# Patient Record
Sex: Male | Born: 1945
Health system: Southern US, Community
[De-identification: ages and names within clinical notes are randomized; demographics above are authoritative.]

## PROBLEM LIST (undated history)

## (undated) DIAGNOSIS — J45909 Unspecified asthma, uncomplicated: Secondary | ICD-10-CM

## (undated) DIAGNOSIS — F4310 Post-traumatic stress disorder, unspecified: Secondary | ICD-10-CM

## (undated) DIAGNOSIS — J189 Pneumonia, unspecified organism: Secondary | ICD-10-CM

## (undated) DIAGNOSIS — E079 Disorder of thyroid, unspecified: Secondary | ICD-10-CM

## (undated) DIAGNOSIS — E785 Hyperlipidemia, unspecified: Secondary | ICD-10-CM

## (undated) DIAGNOSIS — E039 Hypothyroidism, unspecified: Secondary | ICD-10-CM

## (undated) DIAGNOSIS — K219 Gastro-esophageal reflux disease without esophagitis: Secondary | ICD-10-CM

## (undated) DIAGNOSIS — J449 Chronic obstructive pulmonary disease, unspecified: Secondary | ICD-10-CM

## (undated) DIAGNOSIS — G4730 Sleep apnea, unspecified: Secondary | ICD-10-CM

## (undated) DIAGNOSIS — M1990 Unspecified osteoarthritis, unspecified site: Secondary | ICD-10-CM

## (undated) DIAGNOSIS — J8410 Pulmonary fibrosis, unspecified: Secondary | ICD-10-CM

## (undated) DIAGNOSIS — Z8719 Personal history of other diseases of the digestive system: Secondary | ICD-10-CM

## (undated) DIAGNOSIS — I10 Essential (primary) hypertension: Secondary | ICD-10-CM

## (undated) HISTORY — PX: THYROIDECTOMY, PARTIAL: SHX18

## (undated) HISTORY — DX: Unspecified osteoarthritis, unspecified site: M19.90

## (undated) HISTORY — PX: FRACTURE SURGERY: SHX138

## (undated) HISTORY — PX: CERVICAL SPINE SURGERY: SHX589

## (undated) HISTORY — PX: BACK SURGERY: SHX140

## (undated) HISTORY — PX: WISDOM TOOTH EXTRACTION: SHX21

## (undated) HISTORY — PX: FOOT FRACTURE SURGERY: SHX645

## (undated) HISTORY — DX: Chronic obstructive pulmonary disease, unspecified: J44.9

## (undated) HISTORY — DX: Pulmonary fibrosis, unspecified: J84.10

## (undated) HISTORY — PX: APPENDECTOMY: SHX54

## (undated) HISTORY — PX: NISSEN FUNDOPLICATION: SHX2091

## (undated) HISTORY — PX: CARDIAC CATHETERIZATION: SHX172

## (undated) HISTORY — DX: Sleep apnea, unspecified: G47.30

## (undated) HISTORY — DX: Post-traumatic stress disorder, unspecified: F43.10

---
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
## 1984-06-21 HISTORY — PX: NASAL SEPTUM SURGERY: SHX37

## 2001-06-23 ENCOUNTER — Encounter: Payer: Self-pay | Admitting: Family Medicine

## 2002-06-21 HISTORY — PX: FINGER AMPUTATION: SHX636

## 2003-05-01 LAB — HM COLONOSCOPY

## 2004-02-14 ENCOUNTER — Encounter: Admission: RE | Admit: 2004-02-14 | Discharge: 2004-02-14 | Payer: Self-pay | Admitting: Internal Medicine

## 2004-02-25 ENCOUNTER — Encounter: Payer: Self-pay | Admitting: Internal Medicine

## 2004-04-22 ENCOUNTER — Ambulatory Visit: Payer: Self-pay | Admitting: Cardiology

## 2004-04-23 ENCOUNTER — Ambulatory Visit: Payer: Self-pay | Admitting: Cardiology

## 2004-04-24 ENCOUNTER — Ambulatory Visit: Payer: Self-pay | Admitting: Cardiology

## 2004-04-28 ENCOUNTER — Ambulatory Visit: Payer: Self-pay | Admitting: Cardiology

## 2004-04-29 ENCOUNTER — Inpatient Hospital Stay (HOSPITAL_BASED_OUTPATIENT_CLINIC_OR_DEPARTMENT_OTHER): Admission: RE | Admit: 2004-04-29 | Discharge: 2004-04-29 | Payer: Self-pay | Admitting: Cardiology

## 2005-04-29 ENCOUNTER — Encounter: Admission: RE | Admit: 2005-04-29 | Discharge: 2005-04-29 | Payer: Self-pay | Admitting: Internal Medicine

## 2005-05-14 ENCOUNTER — Encounter: Admission: RE | Admit: 2005-05-14 | Discharge: 2005-05-14 | Payer: Self-pay | Admitting: Internal Medicine

## 2005-06-15 ENCOUNTER — Encounter: Admission: RE | Admit: 2005-06-15 | Discharge: 2005-06-15 | Payer: Self-pay | Admitting: General Surgery

## 2005-06-22 ENCOUNTER — Encounter (INDEPENDENT_AMBULATORY_CARE_PROVIDER_SITE_OTHER): Payer: Self-pay | Admitting: Specialist

## 2005-06-22 ENCOUNTER — Other Ambulatory Visit: Admission: RE | Admit: 2005-06-22 | Discharge: 2005-06-22 | Payer: Self-pay | Admitting: Interventional Radiology

## 2005-06-22 ENCOUNTER — Encounter: Admission: RE | Admit: 2005-06-22 | Discharge: 2005-06-22 | Payer: Self-pay | Admitting: General Surgery

## 2005-09-29 ENCOUNTER — Encounter: Admission: RE | Admit: 2005-09-29 | Discharge: 2005-09-29 | Payer: Self-pay | Admitting: Endocrinology

## 2006-04-22 ENCOUNTER — Ambulatory Visit: Payer: Self-pay | Admitting: Family Medicine

## 2006-04-22 LAB — CONVERTED CEMR LAB
AST: 24 units/L (ref 0–37)
BUN: 12 mg/dL (ref 6–23)
CO2: 31 meq/L (ref 19–32)
Calcium: 9.7 mg/dL (ref 8.4–10.5)
Creatinine, Ser: 1 mg/dL (ref 0.4–1.5)
Glomerular Filtration Rate, Af Am: 98 mL/min/{1.73_m2}
PSA: 6.02 ng/mL — ABNORMAL HIGH (ref 0.10–4.00)

## 2006-05-24 ENCOUNTER — Encounter: Admission: RE | Admit: 2006-05-24 | Discharge: 2006-05-24 | Payer: Self-pay | Admitting: Endocrinology

## 2006-07-04 ENCOUNTER — Encounter (INDEPENDENT_AMBULATORY_CARE_PROVIDER_SITE_OTHER): Payer: Self-pay | Admitting: *Deleted

## 2006-07-04 ENCOUNTER — Ambulatory Visit (HOSPITAL_COMMUNITY): Admission: RE | Admit: 2006-07-04 | Discharge: 2006-07-05 | Payer: Self-pay | Admitting: General Surgery

## 2006-10-26 DIAGNOSIS — K219 Gastro-esophageal reflux disease without esophagitis: Secondary | ICD-10-CM

## 2006-10-26 DIAGNOSIS — F528 Other sexual dysfunction not due to a substance or known physiological condition: Secondary | ICD-10-CM | POA: Insufficient documentation

## 2006-10-26 DIAGNOSIS — I10 Essential (primary) hypertension: Secondary | ICD-10-CM

## 2006-10-26 DIAGNOSIS — E039 Hypothyroidism, unspecified: Secondary | ICD-10-CM

## 2006-10-26 DIAGNOSIS — E785 Hyperlipidemia, unspecified: Secondary | ICD-10-CM | POA: Insufficient documentation

## 2006-10-28 ENCOUNTER — Encounter: Payer: Self-pay | Admitting: Family Medicine

## 2006-10-28 ENCOUNTER — Ambulatory Visit: Payer: Self-pay | Admitting: Family Medicine

## 2006-10-28 ENCOUNTER — Encounter (INDEPENDENT_AMBULATORY_CARE_PROVIDER_SITE_OTHER): Payer: Self-pay | Admitting: Family Medicine

## 2006-10-28 DIAGNOSIS — R972 Elevated prostate specific antigen [PSA]: Secondary | ICD-10-CM | POA: Insufficient documentation

## 2006-11-04 ENCOUNTER — Ambulatory Visit: Payer: Self-pay | Admitting: Family Medicine

## 2006-11-06 LAB — CONVERTED CEMR LAB
ALT: 30 units/L (ref 0–40)
Calcium: 9.4 mg/dL (ref 8.4–10.5)
Chloride: 103 meq/L (ref 96–112)
GFR calc Af Amer: 111 mL/min
GFR calc non Af Amer: 91 mL/min
Sodium: 140 meq/L (ref 135–145)

## 2006-11-07 ENCOUNTER — Telehealth (INDEPENDENT_AMBULATORY_CARE_PROVIDER_SITE_OTHER): Payer: Self-pay | Admitting: *Deleted

## 2006-11-09 ENCOUNTER — Ambulatory Visit: Payer: Self-pay | Admitting: Family Medicine

## 2006-11-11 ENCOUNTER — Ambulatory Visit: Payer: Self-pay | Admitting: Family Medicine

## 2006-11-13 LAB — CONVERTED CEMR LAB
HDL: 35.2 mg/dL — ABNORMAL LOW (ref >39.0)
VLDL: 27 mg/dL (ref 0–40)

## 2006-11-15 ENCOUNTER — Telehealth (INDEPENDENT_AMBULATORY_CARE_PROVIDER_SITE_OTHER): Payer: Self-pay | Admitting: *Deleted

## 2006-12-08 ENCOUNTER — Telehealth (INDEPENDENT_AMBULATORY_CARE_PROVIDER_SITE_OTHER): Payer: Self-pay | Admitting: *Deleted

## 2007-04-28 ENCOUNTER — Encounter (INDEPENDENT_AMBULATORY_CARE_PROVIDER_SITE_OTHER): Payer: Self-pay | Admitting: *Deleted

## 2007-04-28 ENCOUNTER — Telehealth (INDEPENDENT_AMBULATORY_CARE_PROVIDER_SITE_OTHER): Payer: Self-pay | Admitting: *Deleted

## 2007-04-28 ENCOUNTER — Ambulatory Visit: Payer: Self-pay | Admitting: Family Medicine

## 2007-04-28 LAB — CONVERTED CEMR LAB
BUN: 14 mg/dL (ref 6–23)
CO2: 31 meq/L (ref 19–32)
GFR calc Af Amer: 88 mL/min
Glucose, Bld: 103 mg/dL — ABNORMAL HIGH (ref 70–99)
HDL: 37.6 mg/dL — ABNORMAL LOW (ref >39.0)
Potassium: 4.6 meq/L (ref 3.5–5.1)
Sodium: 139 meq/L (ref 135–145)
Total CHOL/HDL Ratio: 4.1
Triglycerides: 120 mg/dL (ref 0–149)

## 2007-05-15 ENCOUNTER — Telehealth (INDEPENDENT_AMBULATORY_CARE_PROVIDER_SITE_OTHER): Payer: Self-pay | Admitting: *Deleted

## 2007-07-06 ENCOUNTER — Telehealth (INDEPENDENT_AMBULATORY_CARE_PROVIDER_SITE_OTHER): Payer: Self-pay | Admitting: *Deleted

## 2007-07-13 ENCOUNTER — Telehealth (INDEPENDENT_AMBULATORY_CARE_PROVIDER_SITE_OTHER): Payer: Self-pay | Admitting: *Deleted

## 2007-07-17 ENCOUNTER — Telehealth (INDEPENDENT_AMBULATORY_CARE_PROVIDER_SITE_OTHER): Payer: Self-pay | Admitting: *Deleted

## 2007-10-16 ENCOUNTER — Telehealth (INDEPENDENT_AMBULATORY_CARE_PROVIDER_SITE_OTHER): Payer: Self-pay | Admitting: *Deleted

## 2007-10-18 ENCOUNTER — Encounter (INDEPENDENT_AMBULATORY_CARE_PROVIDER_SITE_OTHER): Payer: Self-pay | Admitting: Family Medicine

## 2008-03-25 ENCOUNTER — Telehealth (INDEPENDENT_AMBULATORY_CARE_PROVIDER_SITE_OTHER): Payer: Self-pay | Admitting: *Deleted

## 2008-03-26 ENCOUNTER — Ambulatory Visit: Payer: Self-pay | Admitting: Family Medicine

## 2008-03-26 DIAGNOSIS — J449 Chronic obstructive pulmonary disease, unspecified: Secondary | ICD-10-CM

## 2008-03-28 ENCOUNTER — Ambulatory Visit: Payer: Self-pay | Admitting: Family Medicine

## 2008-04-11 ENCOUNTER — Ambulatory Visit: Payer: Self-pay | Admitting: Critical Care Medicine

## 2008-04-16 ENCOUNTER — Ambulatory Visit: Payer: Self-pay | Admitting: Critical Care Medicine

## 2008-04-17 ENCOUNTER — Encounter: Payer: Self-pay | Admitting: Critical Care Medicine

## 2008-05-20 ENCOUNTER — Ambulatory Visit: Payer: Self-pay | Admitting: Family Medicine

## 2008-05-20 DIAGNOSIS — J45909 Unspecified asthma, uncomplicated: Secondary | ICD-10-CM

## 2008-05-23 ENCOUNTER — Telehealth (INDEPENDENT_AMBULATORY_CARE_PROVIDER_SITE_OTHER): Payer: Self-pay | Admitting: *Deleted

## 2008-05-28 ENCOUNTER — Ambulatory Visit: Payer: Self-pay | Admitting: Critical Care Medicine

## 2008-06-08 LAB — CONVERTED CEMR LAB
AST: 25 units/L (ref 0–37)
Albumin: 3.7 g/dL (ref 3.5–5.2)
Alkaline Phosphatase: 75 units/L (ref 39–117)
BUN: 12 mg/dL (ref 6–23)
Bilirubin, Direct: 0.1 mg/dL (ref 0.0–0.3)
Chloride: 107 meq/L (ref 96–112)
Eosinophils Relative: 2.6 % (ref 0.0–5.0)
GFR calc Af Amer: 87 mL/min
GFR calc non Af Amer: 72 mL/min
Glucose, Bld: 97 mg/dL (ref 70–99)
HDL: 41.2 mg/dL (ref >39.0)
LDL Cholesterol: 90 mg/dL (ref 0–99)
Monocytes Relative: 8.4 % (ref 3.0–12.0)
Neutrophils Relative %: 70.6 % (ref 43.0–77.0)
Platelets: 225 10*3/uL (ref 150–400)
Potassium: 4.4 meq/L (ref 3.5–5.1)
RDW: 12.5 % (ref 11.5–14.6)
Sodium: 143 meq/L (ref 135–145)
Total CHOL/HDL Ratio: 3.6
Total Protein: 6.7 g/dL (ref 6.0–8.3)
Triglycerides: 85 mg/dL (ref 0–149)
VLDL: 17 mg/dL (ref 0–40)
WBC: 7 10*3/uL (ref 4.5–10.5)

## 2008-06-11 ENCOUNTER — Telehealth (INDEPENDENT_AMBULATORY_CARE_PROVIDER_SITE_OTHER): Payer: Self-pay | Admitting: *Deleted

## 2008-06-18 ENCOUNTER — Encounter (INDEPENDENT_AMBULATORY_CARE_PROVIDER_SITE_OTHER): Payer: Self-pay | Admitting: *Deleted

## 2008-06-18 ENCOUNTER — Encounter: Payer: Self-pay | Admitting: Family Medicine

## 2008-07-04 ENCOUNTER — Ambulatory Visit: Payer: Self-pay | Admitting: Critical Care Medicine

## 2008-09-12 ENCOUNTER — Telehealth (INDEPENDENT_AMBULATORY_CARE_PROVIDER_SITE_OTHER): Payer: Self-pay | Admitting: *Deleted

## 2009-04-16 ENCOUNTER — Ambulatory Visit: Payer: Self-pay | Admitting: Family Medicine

## 2009-05-22 ENCOUNTER — Ambulatory Visit: Payer: Self-pay | Admitting: Family Medicine

## 2009-05-22 DIAGNOSIS — N400 Enlarged prostate without lower urinary tract symptoms: Secondary | ICD-10-CM | POA: Insufficient documentation

## 2009-05-22 LAB — CONVERTED CEMR LAB
Bilirubin Urine: NEGATIVE
Cholesterol, target level: 200 mg/dL
Glucose, Urine, Semiquant: NEGATIVE
Ketones, urine, test strip: NEGATIVE
LDL Goal: 130 mg/dL
Urobilinogen, UA: NEGATIVE
pH: 6

## 2009-05-23 ENCOUNTER — Encounter (INDEPENDENT_AMBULATORY_CARE_PROVIDER_SITE_OTHER): Payer: Self-pay | Admitting: *Deleted

## 2009-05-23 LAB — CONVERTED CEMR LAB
ALT: 43 units/L (ref 0–53)
BUN: 11 mg/dL (ref 6–23)
CO2: 30 meq/L (ref 19–32)
Chloride: 103 meq/L (ref 96–112)
Cholesterol: 144 mg/dL (ref 0–200)
Creatinine, Ser: 1 mg/dL (ref 0.4–1.5)
Eosinophils Absolute: 0.1 10*3/uL (ref 0.0–0.7)
Eosinophils Relative: 2.1 % (ref 0.0–5.0)
Glucose, Bld: 111 mg/dL — ABNORMAL HIGH (ref 70–99)
HCT: 48 % (ref 39.0–52.0)
Lymphs Abs: 1.2 10*3/uL (ref 0.7–4.0)
MCHC: 33.4 g/dL (ref 30.0–36.0)
MCV: 92.4 fL (ref 78.0–100.0)
Monocytes Absolute: 0.6 10*3/uL (ref 0.1–1.0)
Neutrophils Relative %: 68.9 % (ref 43.0–77.0)
Platelets: 187 10*3/uL (ref 150.0–400.0)
Potassium: 4.8 meq/L (ref 3.5–5.1)
RDW: 12.1 % (ref 11.5–14.6)
TSH: 1.22 microintl units/mL (ref 0.35–5.50)
Total Bilirubin: 0.9 mg/dL (ref 0.3–1.2)
Triglycerides: 113 mg/dL (ref 0.0–149.0)
WBC: 6.6 10*3/uL (ref 4.5–10.5)

## 2009-08-04 ENCOUNTER — Telehealth (INDEPENDENT_AMBULATORY_CARE_PROVIDER_SITE_OTHER): Payer: Self-pay | Admitting: *Deleted

## 2009-11-26 ENCOUNTER — Telehealth: Payer: Self-pay | Admitting: Family Medicine

## 2010-03-23 ENCOUNTER — Encounter: Payer: Self-pay | Admitting: Family Medicine

## 2010-05-07 ENCOUNTER — Encounter: Payer: Self-pay | Admitting: Family Medicine

## 2010-05-08 ENCOUNTER — Encounter (INDEPENDENT_AMBULATORY_CARE_PROVIDER_SITE_OTHER): Payer: Self-pay | Admitting: *Deleted

## 2010-07-07 ENCOUNTER — Encounter: Payer: Self-pay | Admitting: Family Medicine

## 2010-07-07 ENCOUNTER — Other Ambulatory Visit: Payer: Self-pay | Admitting: Family Medicine

## 2010-07-07 ENCOUNTER — Ambulatory Visit
Admission: RE | Admit: 2010-07-07 | Discharge: 2010-07-07 | Payer: Self-pay | Source: Home / Self Care | Attending: Family Medicine | Admitting: Family Medicine

## 2010-07-07 LAB — BASIC METABOLIC PANEL
BUN: 13 mg/dL (ref 6–23)
CO2: 29 mEq/L (ref 19–32)
Calcium: 9.3 mg/dL (ref 8.4–10.5)
Chloride: 102 mEq/L (ref 96–112)
Creatinine, Ser: 1 mg/dL (ref 0.4–1.5)
GFR: 79.81 mL/min (ref >60.00)
Glucose, Bld: 95 mg/dL (ref 70–99)
Potassium: 4.4 mEq/L (ref 3.5–5.1)
Sodium: 140 mEq/L (ref 135–145)

## 2010-07-07 LAB — CBC WITH DIFFERENTIAL/PLATELET
Basophils Absolute: 0 10*3/uL (ref 0.0–0.1)
Basophils Relative: 0.3 % (ref 0.0–3.0)
Eosinophils Absolute: 0.4 10*3/uL (ref 0.0–0.7)
Eosinophils Relative: 4.5 % (ref 0.0–5.0)
HCT: 46.1 % (ref 39.0–52.0)
Hemoglobin: 15.8 g/dL (ref 13.0–17.0)
Lymphocytes Relative: 16.5 % (ref 12.0–46.0)
Lymphs Abs: 1.4 10*3/uL (ref 0.7–4.0)
MCHC: 34.4 g/dL (ref 30.0–36.0)
MCV: 89.3 fl (ref 78.0–100.0)
Monocytes Absolute: 0.9 10*3/uL (ref 0.1–1.0)
Monocytes Relative: 11.2 % (ref 3.0–12.0)
Neutro Abs: 5.7 10*3/uL (ref 1.4–7.7)
Neutrophils Relative %: 67.5 % (ref 43.0–77.0)
Platelets: 206 10*3/uL (ref 150.0–400.0)
RBC: 5.16 Mil/uL (ref 4.22–5.81)
RDW: 13.6 % (ref 11.5–14.6)
WBC: 8.4 10*3/uL (ref 4.5–10.5)

## 2010-07-07 LAB — LIPID PANEL
Cholesterol: 156 mg/dL (ref 0–200)
HDL: 41.1 mg/dL (ref >39.00)
LDL Cholesterol: 85 mg/dL (ref 0–99)
Total CHOL/HDL Ratio: 4
Triglycerides: 151 mg/dL — ABNORMAL HIGH (ref 0.0–149.0)
VLDL: 30.2 mg/dL (ref 0.0–40.0)

## 2010-07-07 LAB — HEPATIC FUNCTION PANEL
ALT: 28 U/L (ref 0–53)
AST: 24 U/L (ref 0–37)
Albumin: 4 g/dL (ref 3.5–5.2)
Alkaline Phosphatase: 88 U/L (ref 39–117)
Bilirubin, Direct: 0.1 mg/dL (ref 0.0–0.3)
Total Bilirubin: 0.8 mg/dL (ref 0.3–1.2)
Total Protein: 6.6 g/dL (ref 6.0–8.3)

## 2010-07-07 LAB — TSH: TSH: 2.39 u[IU]/mL (ref 0.35–5.50)

## 2010-07-11 ENCOUNTER — Encounter: Payer: Self-pay | Admitting: Internal Medicine

## 2010-07-21 NOTE — Letter (Signed)
Summary: [facility]  [facility]   Imported By: Lanelle Bal 04/01/2010 14:16:18  _____________________________________________________________________  External Attachment:    Type:   Image     Comment:   External Document

## 2010-07-21 NOTE — Progress Notes (Signed)
Summary: refill  Phone Note Refill Request Message from:  [facility]  Refills Requested: Medication #1:  VIAGRA 100 MG TABS as directed   Last Refilled: 05/22/2009  Follow-up for Phone Call        last ov- 04/2009. Army Fossa CMA  November 26, 2009 10:47 AM   Additional Follow-up for Phone Call Additional follow up Details #1::        refill x1   Additional Follow-up by: Loreen Freud DO,  November 26, 2009 12:18 PM    Prescriptions: VIAGRA 100 MG TABS (SILDENAFIL CITRATE) as directed  #15 x 0   Entered by:   Army Fossa CMA   Authorized by:   Loreen Freud DO   Signed by:   Army Fossa CMA on 11/26/2009   Method used:   Electronically to        [facility] (mail-order)             ,          Ph: [phone]       Fax: [phone]   RxID:   [id number]

## 2010-07-21 NOTE — Miscellaneous (Signed)
Summary: Immunization Entry   Immunization History:  Influenza Immunization History:    Influenza:  historical @ [facility] (05/07/2010)

## 2010-07-21 NOTE — Progress Notes (Signed)
Summary: Refill Request  Phone Note Refill Request Message from:  Pharmacy [facility] Fax #: [phone]  Refills Requested: Medication #1:  SYNTHROID 75 MCG  TABS Take one tablet daily   Dosage confirmed as above?Dosage Confirmed  Medication #2:  OMEPRAZOLE 20 MG TBEC Take 1 tablet by mouth one a day   Dosage confirmed as above?Dosage Confirmed Initial call taken by: Harold Barban,  August 04, 2009 11:40 AM    Prescriptions: OMEPRAZOLE 20 MG TBEC (OMEPRAZOLE) Take 1 tablet by mouth one a day  #90 x 3   Entered by:   Shonna Chock   Authorized by:   Marga Melnick MD   Signed by:   Shonna Chock on 08/04/2009   Method used:   Faxed to ...       [facility]* (mail-order)             ,          Ph: [phone]       Fax: [phone]   RxID:   [id number] SYNTHROID 75 MCG  TABS (LEVOTHYROXINE SODIUM) Take one tablet daily  #90 x 3   Entered by:   Shonna Chock   Authorized by:   Marga Melnick MD   Signed by:   Shonna Chock on 08/04/2009   Method used:   Faxed to ...       [facility] (mail-order)             ,          Ph: [phone]       Fax: [phone]   RxID:   [id number]

## 2010-07-21 NOTE — Miscellaneous (Signed)
Summary: Flu/[facility]  Flu[facility]   Imported By: Lanelle Bal 05/18/2010 12:27:33  _____________________________________________________________________  External Attachment:    Type:   Image     Comment:   External Document

## 2010-07-23 NOTE — Assessment & Plan Note (Signed)
Summary: cpx and fasting labs///sph   Vital Signs:  Patient profile:   65 year old male Height:      73.5 inches Weight:      195.4 pounds BMI:     25.52 Pulse rate:   72 / minute Pulse rhythm:   regular BP sitting:   130 / 80  (right arm) Cuff size:   large  Vitals Entered By: Almeta Monas CMA Duncan Dull) (July 07, 2010 8:45 AM) CC: CPX/Fasting   History of Present Illness: Pt here for cpe and labs.   Pt is getting over cold.   Pt sees Dr Aldean Ast for urology.   Hyperlipidemia follow-up      This is a 65 year old man who presents for Hyperlipidemia follow-up.  The patient denies muscle aches, GI upset, abdominal pain, flushing, itching, constipation, diarrhea, and fatigue.  The patient denies the following symptoms: chest pain/pressure, exercise intolerance, dypsnea, palpitations, syncope, and pedal edema.  Compliance with medications (by patient report) has been near 100%.  Dietary compliance has been good.  The patient reports no exercise.  Adjunctive measures currently used by the patient include ASA and fish oil supplements.    Hypertension follow-up      The patient also presents for Hypertension follow-up.  The patient denies lightheadedness, urinary frequency, headaches, edema, impotence, rash, and fatigue.  The patient denies the following associated symptoms: chest pain, chest pressure, exercise intolerance, dyspnea, palpitations, syncope, leg edema, and pedal edema.  Compliance with medications (by patient report) has been near 100%.  The patient reports that dietary compliance has been good.  The patient reports no exercise.  Adjunctive measures currently used by the patient include salt restriction.    Preventive Screening-Counseling & Management  Alcohol-Tobacco     Alcohol drinks/day: <1     Alcohol type: beer     Smoking Status: quit     Packs/Day: 1.0     Year Quit: 1997     Pack years: 35     Passive Smoke Exposure: no  Caffeine-Diet-Exercise     Caffeine  use/day: 3     Does Patient Exercise: yes     Type of exercise: walking,  elliptical     Times/week: <3     Exercise Counseling: to improve exercise regimen  Hep-HIV-STD-Contraception     HIV Risk: no     Dental Visit-last 6 months yes     Dental Care Counseling: not indicated; dental care within six months  Safety-Violence-Falls     Seat Belt Use: 100      Sexual History:  currently monogamous.    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 Take One Tablet Daily 2)  Synthroid 75 Mcg  Tabs (Levothyroxine Sodium) .... Take One Tablet Daily 3)  Lipitor 20 Mg Tabs (Atorvastatin Calcium) .... As Directed 4)  Omeprazole 20 Mg Tbec (Omeprazole) .... Take 1 Tablet By Mouth One A Day 5)  Viagra 100 Mg Tabs (Sildenafil  Citrate) .... As Directed 6)  Multivitamin/iron   Tabs (Multiple Vitamins-Iron) .... Take 1 Tablet By Mouth Once A Day 7)  Symbicort 160-4.5 Mcg/act Aero (Budesonide-Formoterol Fumarate) .... 2 Puffs Two Times A Day 8)  Combivent 103-18 Mcg/act Aero (Ipratropium-Albuterol) .... 2 Puffs Qid As Needed 9)  Bayer Childrens Aspirin 81 Mg Chew (Aspirin) .... Take One By Mouth Once Daily 10)  Vitamin C 500 Mg Tabs (Ascorbic Acid) .... Take One By Mouth Once Daily 11)  Fish Oil 2000 Mg .[name] Kitchen.. 1 By Mouth Daily. 12)  Caltrate 600+d 600-400 Mg-Unit Tabs (Calcium  Carbonate-Vitamin D) .... Daily. 13)  Avodart 0.5 Mg Caps (Dutasteride) .[name] Kitchen.. 1 By Mouth Daily.  Current Medications (verified): 1)  Lisinopril-Hydrochlorothiazide 10-12.5 Mg  Tabs (Lisinopril-Hydrochlorothiazide) .... Take One Tablet Daily 2)  Synthroid 75 Mcg  Tabs (Levothyroxine Sodium) .... Take One Tablet Daily 3)  Lipitor 20 Mg Tabs (Atorvastatin Calcium) .... As Directed 4)  Omeprazole 20 Mg Tbec (Omeprazole) .... Take 1 Tablet By Mouth One A Day 5)  Viagra 100 Mg Tabs (Sildenafil Citrate) .... As Directed 6)  Multivitamin/iron   Tabs (Multiple Vitamins-Iron) .... Take 1 Tablet By Mouth Once A Day 7)  Combivent 103-18 Mcg/act Aero (Ipratropium-Albuterol) .... 2 Puffs Qid As Needed 8)  Bayer Childrens Aspirin 81 Mg Chew (Aspirin) .... Take One By Mouth Once Daily 9)  Vitamin C 500 Mg Tabs (Ascorbic Acid) .... Take One By Mouth Once Daily 10)  Fish Oil 2000 Mg .[name] Kitchen.. 1 By Mouth Daily. 11)  Caltrate 600+d 600-400 Mg-Unit Tabs (Calcium Carbonate-Vitamin D) .... Daily.  Allergies (verified): 1)  ! Codeine  Past History:  Past Medical History: Last updated: 05/22/2009 Hypertension Hyperlipidemia GERD Asthma    -58% Fev1  DLCO 82% Benign prostatic hypertrophy--- Dr Aldean Ast  Past Surgical History: Last updated: 04/11/2008 06/2006 Thyroidectomy Nissen fundoplication 1993 hernia 1965  Family History: Last updated: 04/11/2008 paternal grandmother-heart disease father-prostate CA  Social History: Last updated: 05/20/2008 Retired-deputy sheriff Former Smoker. Quit smoking 1997.  Smoked x 35 yrs 1ppd. Regular exercise-yes Married with children.  Alcohol use-yes Drug use-no  Risk Factors: Alcohol Use: <1 (07/07/2010) Caffeine Use: 3 (07/07/2010) Exercise: yes (07/07/2010)  Risk Factors: Smoking Status: quit (07/07/2010) Packs/Day: 1.0 (07/07/2010) Passive Smoke Exposure: no (07/07/2010)  Family History: Reviewed history from 04/11/2008 and no changes  required. paternal grandmother-heart disease father-prostate CA  Social History: Reviewed history from 05/20/2008 and no changes required. Retired-deputy sheriff Former Smoker. Quit smoking 1997.  Smoked x 35 yrs 1ppd. Regular exercise-yes Married with children.  Alcohol use-yes Drug use-no Sexual History:  currently monogamous  Review of Systems      See HPI General:  Denies chills, fatigue, fever, loss of appetite, malaise, sleep disorder, sweats, weakness, and weight loss. Eyes:  Denies blurring, discharge, double vision, eye irritation, eye pain, halos, itching, light sensitivity, red eye, vision loss-1 eye, and vision loss-both eyes; optho q1y. ENT:  Denies decreased hearing, difficulty swallowing, ear discharge, earache, hoarseness, nasal congestion, nosebleeds, postnasal drainage, ringing in ears, sinus pressure, and sore throat. CV:  Denies bluish discoloration of lips or nails, chest pain or discomfort, difficulty breathing at night, difficulty breathing while lying down, fainting, fatigue, leg cramps with exertion, lightheadness, near fainting, palpitations, shortness of breath with exertion, swelling of feet, swelling of hands, and weight gain. Resp:  Denies chest discomfort, chest pain with inspiration, cough, coughing up blood, excessive snoring, hypersomnolence, morning headaches, pleuritic, shortness of breath, sputum productive, and wheezing. GI:  Denies abdominal pain, bloody  stools, change in bowel habits, constipation, dark tarry stools, diarrhea, excessive appetite, gas, hemorrhoids, indigestion, loss of appetite, nausea, vomiting, vomiting blood, and yellowish skin color. GU:  Denies decreased libido, discharge, dysuria, erectile dysfunction, genital sores, hematuria, incontinence, nocturia, urinary frequency, and urinary hesitancy. MS:  Denies joint pain, joint redness, joint swelling, loss of strength, low back pain, mid back pain, muscle aches, muscle , cramps, muscle  weakness, stiffness, and thoracic pain. Derm:  Denies changes in color of skin, changes in nail beds, dryness, excessive perspiration, flushing, hair loss, insect bite(s), itching, lesion(s), poor wound healing, and rash. Neuro:  Denies brief paralysis, difficulty with concentration, disturbances in coordination, falling down, headaches, inability to speak, memory loss, numbness, poor balance, seizures, sensation of room spinning, tingling, tremors, visual disturbances, and weakness. Psych:  Denies alternate hallucination ( auditory/visual), anxiety, depression, easily angered, easily tearful, irritability, mental problems, panic attacks, sense of great danger, suicidal thoughts/plans, thoughts of violence, unusual visions or sounds, and thoughts /plans of harming others. Endo:  Denies cold intolerance, excessive hunger, excessive thirst, excessive urination, heat intolerance, polyuria, and weight change. Heme:  Denies abnormal bruising, bleeding, enlarge lymph nodes, fevers, pallor, and skin discoloration. Allergy:  Denies hives or rash, itching eyes, persistent infections, seasonal allergies, and sneezing.  Physical Exam  General:  Well-developed,well-nourished,in no acute distress; alert,appropriate and cooperative throughout examination Head:  Normocephalic and atraumatic without obvious abnormalities. No apparent alopecia or balding. Eyes:  pupils equal, pupils round, pupils reactive to light, and no injection.   Ears:  External ear exam shows no significant lesions or deformities.  Otoscopic examination reveals clear canals, tympanic membranes are intact bilaterally without bulging, retraction, inflammation or discharge. Hearing is grossly normal bilaterally. Nose:  External nasal examination shows no deformity or inflammation. Nasal mucosa are pink and moist without lesions or exudates. Mouth:  Oral mucosa and oropharynx without lesions or exudates.  Teeth in good repair. Neck:  No  deformities, masses, or tenderness noted.no carotid bruits.   Lungs:  Normal respiratory effort, chest expands symmetrically. Lungs are clear to auscultation, no crackles or wheezes. Heart:  normal rate and no murmur.   Abdomen:  Bowel sounds positive,abdomen soft and non-tender without masses, organomegaly or hernias noted. Rectal:  urology Genitalia:  urology Prostate:  urology Msk:  normal ROM, no joint tenderness, no joint swelling, no joint warmth, no redness over joints, no joint deformities, no joint instability, and no crepitation.   Pulses:  R posterior tibial normal, R dorsalis pedis normal, R carotid normal, L posterior tibial normal, L dorsalis pedis normal, and L carotid normal.   Extremities:  No clubbing, cyanosis, edema, or deformity noted with normal full range of motion of all joints.   Neurologic:  No cranial nerve deficits noted. Station and gait are normal. Plantar reflexes are down-going bilaterally. DTRs are symmetrical throughout. Sensory, motor and coordinative functions appear intact. Skin:  Intact without suspicious lesions or rashes Cervical Nodes:  No lymphadenopathy noted Axillary Nodes:  No palpable lymphadenopathy Psych:  Cognition and judgment appear intact. Alert and cooperative with normal attention span and concentration. No apparent delusions, illusions, hallucinations   Impression & Recommendations:  Problem # 1:  PREVENTIVE HEALTH CARE (ICD-V70.0)  Orders: Venipuncture (16109) TLB-Lipid Panel (80061-LIPID) TLB-BMP (Basic Metabolic Panel-BMET) (80048-METABOL) TLB-CBC Platelet - w/Differential (85025-CBCD) TLB-Hepatic/Liver Function Pnl (80076-HEPATIC) TLB-TSH (Thyroid Stimulating Hormone) (84443-TSH) EKG w/ Interpretation (93000)  Reviewed preventive care protocols, scheduled due services, and updated immunizations.  Problem # 2:  BENIGN PROSTATIC HYPERTROPHY (ICD-600.00)  per  urology  Orders: EKG w/ Interpretation (93000)  Problem # 3:   ASTHMA (ICD-493.90)  The following medications were removed from the medication list:    Symbicort 160-4.5 Mcg/act Aero (Budesonide-formoterol fumarate) .[name] Kitchen... 2 puffs two times a day His updated medication list for this problem includes:    Combivent 103-18 Mcg/act Aero (Ipratropium-albuterol) .[name] Kitchen... 2 puffs qid as needed  Pulmonary Functions Reviewed: O2 sat: 97 (07/04/2008)  Orders: EKG w/ Interpretation (93000)  Problem # 4:  HYPOTHYROIDISM (ICD-244.9)  His updated medication list for this problem includes:    Synthroid 75 Mcg Tabs (Levothyroxine sodium) .[name] Kitchen... Take one tablet daily  Orders: Venipuncture (16109) TLB-Lipid Panel (80061-LIPID) TLB-BMP (Basic Metabolic Panel-BMET) (80048-METABOL) TLB-CBC Platelet - w/Differential (85025-CBCD) TLB-Hepatic/Liver Function Pnl (80076-HEPATIC) TLB-TSH (Thyroid Stimulating Hormone) (84443-TSH) EKG w/ Interpretation (93000)  Labs Reviewed: TSH: 1.22 (05/22/2009)    Chol: 144 (05/22/2009)   HDL: 42.00 (05/22/2009)   LDL: 79 (05/22/2009)   TG: 113.0 (05/22/2009)  Problem # 5:  HYPERLIPIDEMIA (ICD-272.4)  His updated medication list for this problem includes:    Lipitor 20 Mg Tabs (Atorvastatin calcium) .[name] Kitchen... As directed  Orders: Venipuncture (60454) TLB-Lipid Panel (80061-LIPID) TLB-BMP (Basic Metabolic Panel-BMET) (80048-METABOL) TLB-CBC Platelet - w/Differential (85025-CBCD) TLB-Hepatic/Liver Function Pnl (80076-HEPATIC) TLB-TSH (Thyroid Stimulating Hormone) (84443-TSH) EKG w/ Interpretation (93000)  Labs Reviewed: SGOT: 29 (05/22/2009)   SGPT: 43 (05/22/2009)  Lipid Goals: Chol Goal: 200 (05/22/2009)   HDL Goal: 40 (05/22/2009)   LDL Goal: 130 (05/22/2009)   TG Goal: 150 (05/22/2009)  Prior 10 Yr Risk Heart Disease: 9 % (05/22/2009)   HDL:42.00 (05/22/2009), 41.2 (05/20/2008)  LDL:79 (05/22/2009), 90 (05/20/2008)  Chol:144 (05/22/2009), 148 (05/20/2008)  Trig:113.0 (05/22/2009), 85 (05/20/2008)  Problem # 6:   HYPERTENSION (ICD-401.9)  His updated medication list for this problem includes:    Lisinopril-hydrochlorothiazide 10-12.5 Mg Tabs (Lisinopril-hydrochlorothiazide) .[name] Kitchen... Take one tablet daily  Orders: Venipuncture (09811) TLB-Lipid Panel (80061-LIPID) TLB-BMP (Basic Metabolic Panel-BMET) (80048-METABOL) TLB-CBC Platelet - w/Differential (85025-CBCD) TLB-Hepatic/Liver Function Pnl (80076-HEPATIC) TLB-TSH (Thyroid Stimulating Hormone) (84443-TSH) EKG w/ Interpretation (93000)  BP today: 130/80 Prior BP: 130/80 (05/22/2009)  Prior 10 Yr Risk Heart Disease: 9 % (05/22/2009)  Labs Reviewed: K+: 4.8 (05/22/2009) Creat: : 1.0 (05/22/2009)   Chol: 144 (05/22/2009)   HDL: 42.00 (05/22/2009)   LDL: 79 (05/22/2009)   TG: 113.0 (05/22/2009)  Problem # 7:  GERD (ICD-530.81)  His updated medication list for this problem includes:    Omeprazole 20 Mg Tbec (Omeprazole) .[name] Kitchen... Take 1 tablet by mouth one a day  Orders: EKG w/ Interpretation (93000)  Complete Medication List: 1)  Lisinopril-hydrochlorothiazide 10-12.5 Mg Tabs (Lisinopril-hydrochlorothiazide) .... Take one tablet daily 2)  Synthroid 75 Mcg Tabs (Levothyroxine sodium) .... Take one tablet daily 3)  Lipitor 20 Mg Tabs (Atorvastatin calcium) .... As directed 4)  Omeprazole 20 Mg Tbec (Omeprazole) .... Take 1 tablet by mouth one a day 5)  Viagra 100 Mg Tabs (Sildenafil citrate) .... As directed 6)  Multivitamin/iron Tabs (Multiple vitamins-iron) .... Take 1 tablet by mouth once a day 7)  Combivent 103-18 Mcg/act Aero (Ipratropium-albuterol) .... 2 puffs qid as needed 8)  Bayer Childrens Aspirin 81 Mg Chew (Aspirin) .... Take one by mouth once daily 9)  Vitamin C 500 Mg Tabs (Ascorbic acid) .... Take one by mouth once daily 10)  Fish Oil 2000 Mg  .[name] KitchenMarland Kitchen. 1 by mouth daily. 11)  Caltrate 600+d 600-400 Mg-unit Tabs (Calcium carbonate-vitamin d) .... Daily.   Orders Added: 1)  Venipuncture [91478] 2)  TLB-Lipid Panel [80061-LIPID]  3)   TLB-BMP (Basic Metabolic Panel-BMET) [80048-METABOL] 4)  TLB-CBC Platelet - w/Differential [85025-CBCD] 5)  TLB-Hepatic/Liver Function Pnl [80076-HEPATIC] 6)  TLB-TSH (Thyroid Stimulating Hormone) [84443-TSH] 7)  Est. Patient 40-64 years [99396] 8)  EKG w/ Interpretation [93000]    Last Flu Vaccine:  historical @ [facility] (05/07/2010 1:15:15 PM) Flu Vaccine Result Date:  05/07/2010 Flu Vaccine Result:  given Flu Vaccine Next Due:  1 yr    Appended Document: cpx and fasting labs///sph   Appended Document: cpx and fasting labs///sph  Laboratory Results   Urine Tests   Date/Time Reported: July 07, 2010 9:54 AM   Routine Urinalysis   Color: yellow Appearance: Clear Glucose: negative   (Normal Range: Negative) Bilirubin: negative   (Normal Range: Negative) Ketone: negative   (Normal Range: Negative) Spec. Gravity: <1.005   (Normal Range: 1.003-1.035) Blood: negative   (Normal Range: Negative) pH: 7.0   (Normal Range: 5.0-8.0) Protein: negative   (Normal Range: Negative) Urobilinogen: negative   (Normal Range: 0-1) Nitrite: negative   (Normal Range: Negative) Leukocyte Esterace: negative   (Normal Range: Negative)    Comments: Floydene Flock  July 07, 2010 9:54 AM

## 2010-09-28 ENCOUNTER — Other Ambulatory Visit: Payer: Self-pay | Admitting: Family Medicine

## 2010-11-06 NOTE — Cardiovascular Report (Signed)
NAME:  Russell Brown, Russell Brown NO.:  [id number]   MEDICAL RECORD NO.:  [id number]          PATIENT TYPE:  OIB   LOCATION:  6501                         FACILITY:  [facility]   PHYSICIAN:  Rollene Rotunda, M.D.   DATE OF BIRTH:  1946-04-22   DATE OF PROCEDURE:  04/29/2004  DATE OF DISCHARGE:                              CARDIAC CATHETERIZATION   PRIMARY CARE PHYSICIAN:  Sharlet Salina, M.D.   PROCEDURE:  Left heart catheterization/coronary arteriography.   INDICATIONS:  The patient with dyspnea and abnormal Cardiolite suggesting  mild ischemia.   DESCRIPTION OF PROCEDURE:  Left heart catheterization was performed via the  right femoral artery.  The artery was cannulated using anterior wall  puncture.  A #4 French arterial sheath was inserted via the modified  Seldinger technique.  A preformed Judkins' and pigtail catheter were  utilized.  The patient tolerated the procedure well and left the lab in  stable condition.   HEMODYNAMICS:  1.  LV 128/24.  2.  AO 126/88.   CORONARY ARTERIOGRAPHY:  1.  Left main was normal.  2.  The LAD was normal (this was a somewhat small vessel and does not wrap      the apex).  3.  Diagonal was normal.  4.  The circumflex was normal.  5.  There was a ramus intermediate which was large and normal.  6.  OM1 and OM2 were small and normal.  7.  There were two moderate sized posterolaterals which were normal.  8.  The right coronary artery was a large dominant vessel.  It was normal      throughout its course.  9.  There was a PDA which was moderate sized.  10. A posterolateral-1 and posterolateral-2 which were small and normal.   LEFT VENTRICULOGRAM:  The left ventriculogram was obtained in the RAO  projection.  The EF was 60% with normal wall motion.   CONCLUSIONS:  1.  Normal coronaries.  2.  Normal left ventricular function.   PLAN:  The patient will be followed by Dr. Willa Rough, Dr. Sharlet Salina, and Dr. Charlaine Dalton. Wert to  continue to workup dyspnea of unclear  etiology.       JH/MEDQ  D:  04/29/2004  T:  04/29/2004  Job:  [id number]   cc:   Sharlet Salina, M.D.  [address]  [place]  [place] [address]  Fax: [phone]   Charlaine Dalton. Sherene Sires, M.D. [facility]   Willa Rough, M.D.

## 2010-11-06 NOTE — Op Note (Signed)
NAME:  Russell Brown, Russell Brown             ACCOUNT NO.:  [id number]   MEDICAL RECORD NO.:  [id number]          PATIENT TYPE:  AMB   LOCATION:  SDS                          FACILITY:  [facility]   PHYSICIAN:  Leonie Man, M.D.   DATE OF BIRTH:  October 06, 1945   DATE OF PROCEDURE:  07/04/2006  DATE OF DISCHARGE:                               OPERATIVE REPORT   PREOPERATIVE DIAGNOSIS:  Enlarging right thyroid mass.   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   PROCEDURES:  Right thyroid lobectomy and isthmectomy.   SURGEON:  Leonie Man, M.D.   ASSISTANT:  Cicero Duck, M.D.   ANESTHESIA:  General.   SPECIMENS TO LAB:  Right thyroid lobe and isthmus.   ESTIMATED BLOOD LOSS:  Minimal.   COMPLICATIONS:  None.  The patient returned to PACU in excellent  condition.   NOTE:  The patient is a 65 year old gentleman with some mild to moderate  dysphagia and dysphonia and a right-sided thyroid mass which increased  by 1 cm incised over the past 6 months.  Previous thyroid.  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  Because of  the rapid increase in size of the thyroid, the patient is brought now to  the operating room, after risks and potential benefits of surgery been  fully discussed, all questions answered and consent obtained.   PROCEDURE:  The patient positioned supinely, head and neck extended  slightly hyperextended.  The neck was prepped and draped to be included  in a sterile operative field after satisfactory general endotracheal  anesthesia.  A collar incision is carried down approximately three  fingerbreadths above the sternal notch, deepened through skin and  subcutaneous tissue and raising myocutaneous flap of the platysma muscle  superiorly up to the thyroid cartilage and inferiorly down to the  sternal notch.  Midline strap muscles are opened up and dissection  carried down on the right side to expose the  right lobe of thyroid.  The  right lobe is mobilized and is noted to be very large cystic mass on the  right thyroid lobe which had probably accounts for this rapid growth.  This was however, not fully appreciated on ultrasound.  Dissection  carried up towards the upper pole where the upper pole vessels were  serially isolated and tied with 2-0 silk tied distally.  I used the  harmonic focus to transect the remainder of the upper lobe.  The thyroid  was then rolled medially and dissection was carried down removing the  thyroid gland serially staying close the along capsule.  The recurrent  laryngeal nerve and the upper and lower right-sided parathyroid glands  were positively identified and spared.  The thyroid was then dissected  free from carrying it across the midline of the trachea and across the  isthmus to the left lobe.  This was divided with the harmonic focus.  The specimen was then forwarded for pathologic evaluation.  Frozen  section diagnosis is consistent with a non-neoplastic thyroid mass.  The  neck was then thoroughly irrigated on the right side with normal  saline.  The left lobe of the thyroid was then exposed thoroughly palpated.  There were no suspicious nodules noted.  Sponge, instrument and sharp  counts were verified.  I then closed midline strap muscles with running  3-0 Vicryl suture.  Platysma muscle closed with interrupted 3-0 Vicryl  sutures.  Skin closed with running 5-0 Monocryl and then reinforced with  Steri-Strips.  Sterile compressive dressings applied.  Anesthetic  reversed.  The patient removed from the operating room to the recovery  room in stable condition.  He tolerated the procedure well.      Leonie Man, M.D.  Electronically Signed     PB/MEDQ  D:  07/04/2006  T:  07/04/2006  Job:  [id number]

## 2010-12-25 ENCOUNTER — Other Ambulatory Visit: Payer: Self-pay | Admitting: Family Medicine

## 2010-12-25 NOTE — Telephone Encounter (Signed)
Rx sent    Kp

## 2011-02-13 ENCOUNTER — Emergency Department (HOSPITAL_COMMUNITY): Payer: Medicare Other

## 2011-02-13 ENCOUNTER — Emergency Department (HOSPITAL_COMMUNITY)
Admission: EM | Admit: 2011-02-13 | Discharge: 2011-02-14 | Disposition: A | Discharge status: Home / Self Care | Payer: Medicare Other | Attending: Emergency Medicine | Admitting: Emergency Medicine

## 2011-02-13 DIAGNOSIS — J45909 Unspecified asthma, uncomplicated: Secondary | ICD-10-CM | POA: Insufficient documentation

## 2011-02-13 DIAGNOSIS — S0180XA Unspecified open wound of other part of head, initial encounter: Secondary | ICD-10-CM | POA: Insufficient documentation

## 2011-02-13 DIAGNOSIS — S61509A Unspecified open wound of unspecified wrist, initial encounter: Secondary | ICD-10-CM | POA: Insufficient documentation

## 2011-02-13 DIAGNOSIS — S0990XA Unspecified injury of head, initial encounter: Secondary | ICD-10-CM | POA: Insufficient documentation

## 2011-02-13 DIAGNOSIS — W108XXA Fall (on) (from) other stairs and steps, initial encounter: Secondary | ICD-10-CM | POA: Insufficient documentation

## 2011-02-13 DIAGNOSIS — S51809A Unspecified open wound of unspecified forearm, initial encounter: Secondary | ICD-10-CM | POA: Insufficient documentation

## 2011-02-13 DIAGNOSIS — I10 Essential (primary) hypertension: Secondary | ICD-10-CM | POA: Insufficient documentation

## 2011-02-13 DIAGNOSIS — Y92009 Unspecified place in unspecified non-institutional (private) residence as the place of occurrence of the external cause: Secondary | ICD-10-CM | POA: Insufficient documentation

## 2011-02-21 ENCOUNTER — Emergency Department (HOSPITAL_BASED_OUTPATIENT_CLINIC_OR_DEPARTMENT_OTHER)
Admission: EM | Admit: 2011-02-21 | Discharge: 2011-02-21 | Disposition: A | Discharge status: Home / Self Care | Payer: Medicare Other | Attending: Emergency Medicine | Admitting: Emergency Medicine

## 2011-02-21 ENCOUNTER — Encounter: Payer: Self-pay | Admitting: Emergency Medicine

## 2011-02-21 DIAGNOSIS — Z4802 Encounter for removal of sutures: Secondary | ICD-10-CM

## 2011-02-21 HISTORY — DX: Gastro-esophageal reflux disease without esophagitis: K21.9

## 2011-02-21 HISTORY — DX: Disorder of thyroid, unspecified: E07.9

## 2011-02-21 HISTORY — DX: Essential (primary) hypertension: I10

## 2011-02-21 HISTORY — DX: Hyperlipidemia, unspecified: E78.5

## 2011-02-21 NOTE — ED Provider Notes (Signed)
History     CSN: [id number] Arrival date & time: 02/21/2011 10:47 AM  Chief Complaint  Patient presents with  . Suture / Staple Removal   HPI Comments: And patient presented to the emergency department after a fall on August 26. He had numerous lacerations repaired at that time. He presents today to have sutures removed on the forehead laceration. He has no redness swelling or increased pain at the site. The wound appears well-healed without any drainage. Patient denies any other acute complaints today and is simply here for suture removal.  Patient is a 65 y.o. male presenting with suture removal.  Suture / Staple Removal     Past Medical History  Diagnosis Date  . Hypertension   . Thyroid disease   . Hyperlipemia   . GERD (gastroesophageal reflux disease)     Past Surgical History  Procedure Date  . Stomach surgery   . Hernia repair   . Appendectomy   . Abdominal surgery   . Back surgery     cspine  . Foot amputation   . Hand amputation     History reviewed. No pertinent family history.  History  Substance Use Topics  . Smoking status: Not on file  . Smokeless tobacco: Not on file  . Alcohol Use:       Review of Systems  Constitutional: Negative.  Negative for fever and chills.  HENT: Negative.   Eyes: Negative.  Negative for discharge and redness.  Respiratory: Negative.  Negative for cough and shortness of breath.   Cardiovascular: Negative.  Negative for chest pain.  Gastrointestinal: Negative.  Negative for nausea, vomiting and abdominal pain.  Genitourinary: Negative.  Negative for hematuria.  Musculoskeletal: Negative for back pain.  Skin: Positive for wound. Negative for color change and rash.  Neurological: Negative for syncope and headaches.  Hematological: Negative.  Negative for adenopathy.  Psychiatric/Behavioral: Negative.  Negative for confusion.  All other systems reviewed and are negative.    Physical Exam  BP 148/88  Pulse 72   Temp(Src) 98.3 F (36.8 C) (Oral)  Resp 16  SpO2 98%  Physical Exam  Constitutional: He is oriented to person, place, and time. He appears well-developed and well-nourished.  Non-toxic appearance. He does not have a sickly appearance.  HENT:  Head: Normocephalic and atraumatic.       Patient does have bruising across his chin and around his right eye. This bruising is purple and yellow in color at this time. He has a wound above his right eyebrow extending up his forehead that is well healed with no redness and sutures in place.  Eyes: Conjunctivae, EOM and lids are normal. Pupils are equal, round, and reactive to light.  Neck: Trachea normal, normal range of motion and full passive range of motion without pain. Neck supple.  Pulmonary/Chest: Effort normal. No respiratory distress.  Abdominal: Normal appearance. He exhibits no distension. There is no tenderness. There is no rebound and no CVA tenderness.  Musculoskeletal: Normal range of motion.  Neurological: He is alert and oriented to person, place, and time. He has normal strength.  Skin: Skin is warm, dry and intact. No rash noted.  Psychiatric: He has a normal mood and affect. His behavior is normal. Judgment and thought content normal.    ED Course  Procedures  MDM Patient will have his sutures removed and be discharged home. He has been instructed to have the forearm laceration sutures removed later this week in another 3-7 days.  Russell Christen, MD 02/21/11 [id number]

## 2011-02-21 NOTE — ED Notes (Signed)
Pt here for suture removal to forehead.  Pt seen one week ago for fall.  No s/sx of infection.  No problems with wounds.

## 2011-04-20 ENCOUNTER — Other Ambulatory Visit: Payer: Self-pay | Admitting: Internal Medicine

## 2011-04-20 ENCOUNTER — Other Ambulatory Visit: Payer: Self-pay | Admitting: Family Medicine

## 2011-04-20 ENCOUNTER — Ambulatory Visit: Payer: 59

## 2011-07-18 ENCOUNTER — Other Ambulatory Visit: Payer: Self-pay | Admitting: Family Medicine

## 2011-07-19 NOTE — Telephone Encounter (Signed)
Letter mailed     KP 

## 2011-08-19 ENCOUNTER — Ambulatory Visit (INDEPENDENT_AMBULATORY_CARE_PROVIDER_SITE_OTHER): Payer: 59 | Admitting: Family Medicine

## 2011-08-19 ENCOUNTER — Encounter: Payer: Self-pay | Admitting: Family Medicine

## 2011-08-19 VITALS — BP 136/88 | HR 72 | Temp 98.6°F | Ht 73.5 in | Wt 193.4 lb

## 2011-08-19 DIAGNOSIS — N529 Male erectile dysfunction, unspecified: Secondary | ICD-10-CM

## 2011-08-19 DIAGNOSIS — E039 Hypothyroidism, unspecified: Secondary | ICD-10-CM

## 2011-08-19 DIAGNOSIS — E785 Hyperlipidemia, unspecified: Secondary | ICD-10-CM

## 2011-08-19 DIAGNOSIS — N400 Enlarged prostate without lower urinary tract symptoms: Secondary | ICD-10-CM

## 2011-08-19 DIAGNOSIS — J302 Other seasonal allergic rhinitis: Secondary | ICD-10-CM

## 2011-08-19 DIAGNOSIS — Z0000 Encounter for general adult medical examination without abnormal findings: Secondary | ICD-10-CM

## 2011-08-19 DIAGNOSIS — J309 Allergic rhinitis, unspecified: Secondary | ICD-10-CM

## 2011-08-19 DIAGNOSIS — I10 Essential (primary) hypertension: Secondary | ICD-10-CM

## 2011-08-19 DIAGNOSIS — Z1211 Encounter for screening for malignant neoplasm of colon: Secondary | ICD-10-CM

## 2011-08-19 LAB — BASIC METABOLIC PANEL
CO2: 30 mEq/L (ref 19–32)
Calcium: 9.2 mg/dL (ref 8.4–10.5)
Creatinine, Ser: 1 mg/dL (ref 0.4–1.5)

## 2011-08-19 LAB — POCT URINALYSIS DIPSTICK
Bilirubin, UA: NEGATIVE
Blood, UA: NEGATIVE
Glucose, UA: NEGATIVE
Ketones, UA: NEGATIVE
Spec Grav, UA: 1.01

## 2011-08-19 LAB — CBC WITH DIFFERENTIAL/PLATELET
Basophils Absolute: 0 10*3/uL (ref 0.0–0.1)
Basophils Relative: 0.3 % (ref 0.0–3.0)
Eosinophils Absolute: 0.4 10*3/uL (ref 0.0–0.7)
Lymphocytes Relative: 24.7 % (ref 12.0–46.0)
MCHC: 33.8 g/dL (ref 30.0–36.0)
Neutrophils Relative %: 60.1 % (ref 43.0–77.0)
Platelets: 195 10*3/uL (ref 150.0–400.0)
RBC: 5.27 Mil/uL (ref 4.22–5.81)

## 2011-08-19 LAB — TSH: TSH: 1.32 u[IU]/mL (ref 0.35–5.50)

## 2011-08-19 LAB — HEPATIC FUNCTION PANEL
Alkaline Phosphatase: 72 U/L (ref 39–117)
Bilirubin, Direct: 0.1 mg/dL (ref 0.0–0.3)
Total Bilirubin: 0.7 mg/dL (ref 0.3–1.2)

## 2011-08-19 MED ORDER — ATORVASTATIN CALCIUM 10 MG PO TABS: 10.0000 mg | ORAL_TABLET | Freq: Every day | ORAL | Status: DC

## 2011-08-19 MED ORDER — CHLORPHENIRAMINE MALEATE 4 MG PO TABS: 4.0000 mg | ORAL_TABLET | Freq: Two times a day (BID) | ORAL | Status: DC | PRN

## 2011-08-19 MED ORDER — SILDENAFIL CITRATE 50 MG PO TABS: 50.0000 mg | ORAL_TABLET | Freq: Every day | ORAL | Status: DC | PRN

## 2011-08-19 MED ORDER — MULTI-VITAMIN/MINERALS PO TABS: 1.0000 | ORAL_TABLET | Freq: Every day | ORAL | Status: AC

## 2011-08-19 MED ORDER — CALCIUM CARBONATE-VITAMIN D 600-400 MG-UNIT PO CHEW: 1.0000 | CHEWABLE_TABLET | Freq: Every day | ORAL | Status: DC

## 2011-08-19 NOTE — Assessment & Plan Note (Signed)
Stable---slightly elevated today con't meds

## 2011-08-19 NOTE — Assessment & Plan Note (Signed)
con't meds  Check labs 

## 2011-08-19 NOTE — Patient Instructions (Signed)
Preventative Care for Adults, Male A healthy lifestyle and preventative care can promote health and wellness. Preventative health guidelines for men include the following key practices:  A routine yearly physical is a good way to check with your caregiver about your health and preventative screening. It is a chance to share any concerns and updates on your health, and to receive a thorough exam.   Visit your dentist for a routine exam and preventative care every 6 months. Brush your teeth twice a day and floss once a day. Good oral hygiene prevents tooth decay and gum disease.   The frequency of eye exams is based on your age, health, family medical history, use of contact lenses, and other factors. Follow your caregiver's recommendations for frequency of eye exams.   Eat a healthy diet. Foods like vegetables, fruits, whole grains, low-fat dairy products, and lean protein foods contain the nutrients you need without too many calories. Decrease your intake of foods high in solid fats, added sugars, and salt. Eat the right amount of calories for you.Get information about a proper diet from your caregiver, if necessary.   Regular physical exercise is one of the most important things you can do for your health. Most adults should get at least 150 minutes of moderate-intensity exercise (any activity that increases your heart rate and causes you to sweat) each week. In addition, most adults need muscle-strengthening exercises on 2 or more days a week.   Maintain a healthy weight. The body mass index (BMI) is a screening tool to identify possible weight problems. It provides an estimate of body fat based on height and weight. Your caregiver can help determine your BMI, and can help you achieve or maintain a healthy weight.For adults 20 years and older:   A BMI below 18.5 is considered underweight.   A BMI of 18.5 to 24.9 is normal.   A BMI of 25 to 29.9 is considered overweight.   A BMI of 30 and  above is considered obese.   Maintain normal blood lipids and cholesterol levels by exercising and minimizing your intake of saturated fat. Eat a balanced diet with plenty of fruit and vegetables. Blood tests for lipids and cholesterol should begin at age 20 and be repeated every 5 years. If your lipid or cholesterol levels are high, you are over 50, or you are a high risk for heart disease, you may need your cholesterol levels checked more frequently.Ongoing high lipid and cholesterol levels should be treated with medicines if diet and exercise are not effective.   If you smoke, find out from your caregiver how to quit. If you do not use tobacco, do not start.   If you choose to drink alcohol, do not exceed 2 drinks per day. One drink is considered to be 12 ounces (355 mL) of beer, 5 ounces (148 mL) of wine, or 1.5 ounces (44 mL) of liquor.   Avoid use of street drugs. Do not share needles with anyone. Ask for help if you need support or instructions about stopping the use of drugs.   High blood pressure causes heart disease and increases the risk of stroke. Your blood pressure should be checked at least every 1 to 2 years. Ongoing high blood pressure should be treated with medicines, if weight loss and exercise are not effective.   If you are 45 to 66 years old, ask your caregiver if you should take aspirin to prevent heart disease.   Diabetes screening involves taking a blood   sample to check your fasting blood sugar level. This should be done once every 3 years, after age 45, if you are within normal weight and without risk factors for diabetes. Testing should be considered at a younger age or be carried out more frequently if you are overweight and have at least 1 risk factor for diabetes.   Colorectal cancer can be detected and often prevented. Most routine colorectal cancer screening begins at the age of 50 and continues through age 75. However, your caregiver may recommend screening at an  earlier age if you have risk factors for colon cancer. On a yearly basis, your caregiver may provide home test kits to check for hidden blood in the stool. Use of a small camera at the end of a tube, to directly examine the colon (sigmoidoscopy or colonoscopy), can detect the earliest forms of colorectal cancer. Talk to your caregiver about this at age 50, when routine screening begins. Direct examination of the colon should be repeated every 5 to 10 years through age 75, unless early forms of pre-cancerous polyps or small growths are found.   Practice safe sex. Use condoms and avoid high-risk sexual practices to reduce the spread of sexually transmitted infections (STIs). STIs include gonorrhea, chlamydia, syphilis, trichomonas, herpes, HPV, and human immunodeficiency virus (HIV). Herpes, HIV, and HPV are viral illnesses that have no cure. They can result in disability, cancer, and death.   A one-time screening for abdominal aortic aneurysm (AAA) and surgical repair of large AAAs by sound wave imaging (ultrasonography) is recommended for ages 65 to 75 years who are current or former smokers.   Healthy men should no longer receive prostate-specific antigen (PSA) blood tests as part of routine cancer screening. Consult with your caregiver about prostate cancer screening.   Use sunscreen with skin protection factor (SPF) of 30 or more. Apply sunscreen liberally and repeatedly throughout the day. You should seek shade when your shadow is shorter than you. Protect yourself by wearing long sleeves, pants, a wide-brimmed hat, and sunglasses year round, whenever you are outdoors.   Once a month, do a whole body skin exam, using a mirror to look at the skin on your back. Notify your caregiver of new moles, moles that have irregular borders, moles that are larger than a pencil eraser, or moles that have changed in shape or color.   Stay current with required immunizations.   Influenza. You need a dose every  fall (or winter). The composition of the flu vaccine changes each year, so being vaccinated once is not enough.   Pneumococcal polysaccharide. You need 1 to 2 doses if you smoke cigarettes or if you have certain chronic medical conditions. You need 1 dose at age 65 (or older) if you have never been vaccinated.   Tetanus, diphtheria, pertussis (Tdap, Td). Get 1 dose of Tdap vaccine if you are younger than age 65 years, are over 65 and have contact with an infant, are a healthcare worker, or simply want to be protected from whooping cough. After that, you need a Td booster dose every 10 years. Consult your caregiver if you have not had at least 3 tetanus and diphtheria-containing shots sometime in your life or have a deep or dirty wound.   HPV. This vaccine is recommended for males 13 through 66 years of age. This vaccine may be given to men 22 through 66 years of age who have not completed the 3 dose series. It is recommended for men through age 26   who have sex with men or whose immune system is weakened because of HIV infection, other illness, or medications. The vaccine is given in 3 doses over 6 months.   Measles, mumps, rubella (MMR). You need at least 1 dose of MMR if you were born in 1957 or later. You may also need a 2nd dose.   Meningococcal. If you are age 19 to 21 years and a first-year college student living in a residence hall, or have one of several medical conditions, you need to get vaccinated against meningococcal disease. You may also need additional booster doses.   Zoster (shingles). If you are age 60 years or older, you should get this vaccine.   Varicella (chickenpox). If you have never had chickenpox or you were vaccinated but received only 1 dose, talk to your caregiver to find out if you need this vaccine.   Hepatitis A. You need this vaccine if you have a specific risk factor for hepatitis A virus infection, or you simply wish to be protected from this disease. The vaccine is  usually given as 2 doses, 6 to 18 months apart.   Hepatitis B. You need this vaccine if you have a specific risk factor for hepatitis B virus infection or you simply wish to be protected from this disease. The vaccine is given in 3 doses, usually over 6 months.  Preventative Service / Frequency Ages 19 to 39  Blood pressure check.** / Every 1 to 2 years.   Lipid and cholesterol check.**/ Every 5 years beginning at age 20.   Skin self-exam. / Monthly.   Influenza immunization.** / Every year.   Pneumococcal polysaccharide immunization.** / 1 to 2 doses if you smoke cigarettes or if you have certain chronic medical conditions.   Tetanus, diphtheria, pertussis (Tdap,Td) immunization. / A one-time dose of Tdap vaccine. After that, you need a Td booster dose every 10 years.   HPV immunization. / 3 doses over 6 months, if 26 and younger.   Measles, mumps, rubella (MMR) immunization. / You need at least 1 dose of MMR if you were born in 1957 or later. You may also need a 2nd dose.   Meningococcal immunization. / 1 dose if you are age 19 to 21 years and a first-year college student living in a residence hall, or have one of several medical conditions, you need to get vaccinated against meningococcal disease. You may also need additional booster doses.   Varicella immunization. **/ Consult your caregiver.   Hepatitis A immunization. ** / Consult your caregiver. 2 doses, 6 to 18 months apart.   Hepatitis B immunization.** / Consult your caregiver. 3 doses usually over 6 months.  Ages 40 to 64  Blood pressure check.** / Every 1 to 2 years.   Lipid and cholesterol check.**/ Every 5 years beginning at age 20.   Fecal occult blood test (FOBT) of stool. / Every year beginning at age 50 and continuing until age 75. You may not have to do this test if you get colonoscopy every 10 years.   Flexible sigmoidoscopy** or colonoscopy.** / Every 5 years for a flexible sigmoidoscopy or every 10 years for  a colonoscopy beginning at age 50 and continuing until age 75.   Skin self-exam. / Monthly.   Influenza immunization.** / Every year.   Pneumococcal polysaccharide immunization.** / 1 to 2 doses if you smoke cigarettes or if you have certain chronic medical conditions.   Tetanus, diphtheria, pertussis (Tdap/Td) immunization.** / A one-time dose of   Tdap vaccine. After that, you need a Td booster dose every 10 years.   Measles, mumps, rubella (MMR) immunization. / You need at least 1 dose of MMR if you were born in 1957 or later. You may also need a 2nd dose.   Varicella immunization. **/ Consult your caregiver.   Meningococcal immunization.** / Consult your caregiver.   Hepatitis A immunization. ** / Consult your caregiver. 2 doses, 6 to 18 months apart.   Hepatitis B immunization.** / Consult your caregiver. 3 doses, usually over 6 months.  Ages 65 and over  Blood pressure check.** / Every 1 to 2 years.   Lipid and cholesterol check.**/ Every 5 years beginning at age 20.   Fecal occult blood test (FOBT) of stool. / Every year beginning at age 50 and continuing until age 75. You may not have to do this test if you get colonoscopy every 10 years.   Flexible sigmoidoscopy** or colonoscopy.** / Every 5 years for a flexible sigmoidoscopy or every 10 years for a colonoscopy beginning at age 50 and continuing until age 75.   Abdominal aortic aneurysm (AAA) screening.** / A one-time screening for ages 65 to 75 years who are current or former smokers.   Skin self-exam. / Monthly.   Influenza immunization.** / Every year.   Pneumococcal polysaccharide immunization.** / 1 dose at age 65 (or older) if you have never been vaccinated.   Tetanus, diphtheria, pertussis (Tdap, Td) immunization. / A one-time dose of Tdap vaccine if you are over 65 and have contact with an infant, are a healthcare worker, or simply want to be protected from whooping cough. After that, you need a Td booster dose  every 10 years.   Varicella immunization. **/ Consult your caregiver.   Meningococcal immunization.** / Consult your caregiver.   Hepatitis A immunization. ** / Consult your caregiver. 2 doses, 6 to 18 months apart.   Hepatitis B immunization.** / Check with your caregiver. 3 doses, usually over 6 months.  **Family history and personal history of risk and conditions may change your caregiver's recommendations. Document Released: 08/03/2001 Document Revised: 02/17/2011 Document Reviewed: 11/02/2010 [employer] Patient Information 2012 [employer]. 

## 2011-08-19 NOTE — Progress Notes (Signed)
Subjective:    Russell Brown is a 66 y.o. male who presents for Medicare Annual/Subsequent preventive examination.   Preventive Screening-Counseling & Management  Tobacco History  Smoking status  . Former Smoker -- 1.0 packs/day for 35 years  . Quit date: 08/18/1996  Smokeless tobacco  . Never Used    Problems Prior to Visit 1.   Current Problems (verified) Patient Active Problem List  Diagnoses  . HYPOTHYROIDISM  . HYPERLIPIDEMIA  . ERECTILE DYSFUNCTION  . HYPERTENSION  . CHRONIC OBSTRUCTIVE PULMONARY DISEASE, ACUTE EXACERBATION  . ASTHMA  . GERD  . BENIGN PROSTATIC HYPERTROPHY  . ELEVATED PROSTATE SPECIFIC ANTIGEN    Medications Prior to Visit Current Outpatient Prescriptions on File Prior to Visit  Medication Sig Dispense Refill  . aspirin (ASPIRIN EC) 81 MG EC tablet Take 81 mg by mouth daily.        [name] Kitchen levothyroxine (SYNTHROID, LEVOTHROID) 75 MCG tablet TAKE 1 TABLET DAILY  90 tablet  1  . lisinopril-hydrochlorothiazide (PRINZIDE,ZESTORETIC) 10-12.5 MG per tablet TAKE 1 TABLET DAILY  90 tablet  0  . omeprazole (PRILOSEC) 20 MG capsule TAKE 1 CAPSULE DAILY  90 capsule  1    Current Medications (verified) Current Outpatient Prescriptions  Medication Sig Dispense Refill  . aspirin (ASPIRIN EC) 81 MG EC tablet Take 81 mg by mouth daily.        . calcium carbonate (OS-CAL) 600 MG TABS Take 600 mg by mouth 2 (two) times daily with a meal.      . dutasteride (AVODART) 0.5 MG capsule Take 0.5 mg by mouth daily.      . fish oil-omega-3 fatty acids 1000 MG capsule Take 3 g by mouth daily.      [name] Kitchen levothyroxine (SYNTHROID, LEVOTHROID) 75 MCG tablet TAKE 1 TABLET DAILY  90 tablet  1  . lisinopril-hydrochlorothiazide (PRINZIDE,ZESTORETIC) 10-12.5 MG per tablet TAKE 1 TABLET DAILY  90 tablet  0  . omeprazole (PRILOSEC) 20 MG capsule TAKE 1 CAPSULE DAILY  90 capsule  1  . sildenafil (VIAGRA) 50 MG tablet Take 50 mg by mouth daily as needed.      . vitamin C (ASCORBIC  ACID) 500 MG tablet Take 500 mg by mouth daily.      [name] Kitchen atorvastatin (LIPITOR) 10 MG tablet Take 1 tablet (10 mg total) by mouth daily.  90 tablet  3  . Calcium Carbonate-Vitamin D (CALCIUM 600/VITAMIN D) 600-400 MG-UNIT per chew tablet Chew 1 tablet by mouth daily.      . chlorpheniramine (CHLOR-TRIMETON) 4 MG tablet Take 1 tablet (4 mg total) by mouth 2 (two) times daily as needed for allergies.  14 tablet  0  . Multiple Vitamins-Minerals (MULTIVITAMIN WITH MINERALS) tablet Take 1 tablet by mouth daily.      . sildenafil (VIAGRA) 50 MG tablet Take 1 tablet (50 mg total) by mouth daily as needed for erectile dysfunction.  10 tablet  0     Allergies (verified) Codeine   PAST HISTORY  Family History Family History  Problem Relation Age of Onset  . Parkinsonism Mother   . Osteoporosis Mother   . Cancer Father     prostate,  skin  . Hyperlipidemia Father   . Hypertension Father   . Asthma Son   . Osteoporosis Paternal Aunt   . Alzheimer's disease Paternal Uncle   . Osteoporosis Maternal Grandmother   . Cancer Maternal Grandmother     lung  . Heart disease Paternal Grandmother     MI  .  Heart disease Paternal Grandfather     MI    Social History History  Substance Use Topics  . Smoking status: Former Smoker -- 1.0 packs/day for 35 years    Quit date: 08/18/1996  . Smokeless tobacco: Never Used  . Alcohol Use: 7.2 oz/week    12 Cans of beer per week    Are there smokers in your home (other than you)?  No  Risk Factors Current exercise habits: pt to start exercising at [facility]  Dietary issues discussed: na   Cardiac risk factors: advanced age (older than 5 for men, 42 for women), dyslipidemia, hypertension, male gender, sedentary lifestyle and smoking/ tobacco exposure.  Depression Screen (Note: if answer to either of the following is "Yes", a more complete depression screening is indicated)   Q1: Over the past two weeks, have you felt down, depressed or hopeless? No  Q2:  Over the past two weeks, have you felt little interest or pleasure in doing things? No  Have you lost interest or pleasure in daily life? No  Do you often feel hopeless? No  Do you cry easily over simple problems? No  Activities of Daily Living In your present state of health, do you have any difficulty performing the following activities?:  Driving? No Managing money?  No Feeding yourself? No Getting from bed to chair? No Climbing a flight of stairs? No Preparing food and eating?: No Bathing or showering? No Getting dressed: No Getting to the toilet? No Using the toilet:No Moving around from place to place: No In the past year have you fallen or had a near fall?:No   Are you sexually active?  Yes  Do you have more than one partner?  No  Hearing Difficulties: No Do you often ask people to speak up or repeat themselves? No Do you experience ringing or noises in your ears? No Do you have difficulty understanding soft or whispered voices? No   Do you feel that you have a problem with memory? No  Do you often misplace items? No  Do you feel safe at home?  No  Cognitive Testing  Alert? Yes  Normal Appearance?Yes  Oriented to person? Yes  Place? Yes   Time? Yes  Recall of three objects?  Yes  Can perform simple calculations? Yes  Displays appropriate judgment?Yes  Can read the correct time from a watch face?Yes   Advanced Directives have been discussed with the patient? Yes   List the Names of Other Physician/Practitioners you currently use: 1.  GI--[facility] 2.  opht-- [facility] 3. Dentist--[facility] 4.  Urology---[place] Indicate any recent Medical Services you may have received from other than [facility] providers in the past year (date may be approximate).  Immunization History  Administered Date(s) Administered  . H1N1 05/28/2008  . Influenza Whole 04/28/2007, 03/26/2008, 04/16/2009, 05/07/2010, 04/10/2011  . Influenza-Generic 03/22/2011  . Pneumococcal Polysaccharide  04/11/2008  . Td 05/22/2009  . Zoster 05/22/2009    Screening Tests Health Maintenance  Topic Date Due  . Pneumococcal Polysaccharide Vaccine Age 32 And Over  12/10/2010  . Influenza Vaccine  03/21/2012  . Colonoscopy  04/30/2013  . Tetanus/tdap  05/23/2019  . Zostavax  Completed    All answers were reviewed with the patient and necessary referrals were made:  Loreen Freud, DO   08/19/2011   History reviewed: allergies, current medications, past family history, past medical history, past social history, past surgical history and problem list  Review of Systems  Review of Systems  Constitutional: Negative  for activity change, appetite change and fatigue.  HENT: Negative for hearing loss, congestion, tinnitus and ear discharge.   Eyes: Negative for visual disturbance (see optho q1y -- vision corrected to 20/20 with glasses).  Respiratory: Negative for cough, chest tightness and shortness of breath.   Cardiovascular: Negative for chest pain, palpitations and leg swelling.  Gastrointestinal: Negative for abdominal pain, diarrhea, constipation and abdominal distention.  Genitourinary: Negative for urgency, frequency, decreased urine volume and difficulty urinating.  Musculoskeletal: Negative for back pain, arthralgias and gait problem.  Skin: Negative for color change, pallor and rash.  Neurological: Negative for dizziness, light-headedness, numbness and headaches.  Hematological: Negative for adenopathy. Does not bruise/bleed easily.  Psychiatric/Behavioral: Negative for suicidal ideas, confusion, sleep disturbance, self-injury, dysphoric mood, decreased concentration and agitation.  Pt is able to read and write and can do all ADLs No risk for falling No abuse/ violence in home    Objective:     Vision by Snellen chart: opth Blood pressure 136/88, pulse 72, temperature 98.6 F (37 C), temperature source Oral, height 6' 1.5" (1.867 m), weight 193 lb 6.4 oz (87.726 kg), SpO2  96.00%. Body mass index is 25.17 kg/(m^2).  BP 136/88  Pulse 72  Temp(Src) 98.6 F (37 C) (Oral)  Ht 6' 1.5" (1.867 m)  Wt 193 lb 6.4 oz (87.726 kg)  BMI 25.17 kg/m2  SpO2 96% General appearance: alert, cooperative, appears older than stated age and no distress Head: Normocephalic, without obvious abnormality, atraumatic Eyes: conjunctivae/corneas clear. PERRL, EOM's intact. Fundi benign. Ears: normal TM's and external ear canals both ears Nose: Nares normal. Septum midline. Mucosa normal. No drainage or sinus tenderness. Throat: lips, mucosa, and tongue normal; teeth and gums normal Neck: no adenopathy, no carotid bruit, no JVD, supple, symmetrical, trachea midline and thyroid not enlarged, symmetric, no tenderness/mass/nodules Back: symmetric, no curvature. ROM normal. No CVA tenderness. Lungs: clear to auscultation bilaterally Chest wall: no tenderness Heart: regular rate and rhythm, S1, S2 normal, no murmur, click, rub or gallop Abdomen: soft, non-tender; bowel sounds normal; no masses,  no organomegaly Male genitalia: normal, deferred---urology q74m Rectal: deferred Extremities: extremities normal, atraumatic, no cyanosis or edema Pulses: 2+ and symmetric Skin: Skin color, texture, turgor normal. No rashes or lesions Lymph nodes: Cervical, supraclavicular, and axillary nodes normal. Neurologic: Alert and oriented X 3, normal strength and tone. Normal symmetric reflexes. Normal coordination and gait Psych--no depression/ anxiety     Assessment:     cpe     Plan:    ghm utd During the course of the visit the patient was educated and counseled about appropriate screening and preventive services including:    Pneumococcal vaccine   Influenza vaccine  Td vaccine  Prostate cancer screening  Colorectal cancer screening  Diabetes screening  Smoking cessation counseling  Advanced directives: has an advanced directive - a copy HAS NOT been provided.  Diet review  for nutrition referral? Yes ____  Not Indicated ____   Patient Instructions (the written plan) was given to the patient.  Medicare Attestation I have personally reviewed: The patient's medical and social history Their use of alcohol, tobacco or illicit drugs Their current medications and supplements The patient's functional ability including ADLs,fall risks, home safety risks, cognitive, and hearing and visual impairment Diet and physical activities Evidence for depression or mood disorders  The patient's weight, height, BMI, and visual acuity have been recorded in the chart.  I have made referrals, counseling, and provided education to the patient based on review of  the above and I have provided the patient with a written personalized care plan for preventive services.     Loreen Freud, DO   08/19/2011

## 2011-08-19 NOTE — Assessment & Plan Note (Signed)
Per u rology 

## 2011-08-20 LAB — LIPID PANEL
Cholesterol: 187 mg/dL (ref 0–200)
Total CHOL/HDL Ratio: 3
Triglycerides: 207 mg/dL — ABNORMAL HIGH (ref 0.0–149.0)
VLDL: 41.4 mg/dL — ABNORMAL HIGH (ref 0.0–40.0)

## 2011-10-17 ENCOUNTER — Other Ambulatory Visit: Payer: Self-pay | Admitting: Family Medicine

## 2011-10-17 ENCOUNTER — Other Ambulatory Visit: Payer: Self-pay | Admitting: Internal Medicine

## 2011-11-07 ENCOUNTER — Other Ambulatory Visit: Payer: Self-pay | Admitting: Family Medicine

## 2012-02-20 ENCOUNTER — Emergency Department (HOSPITAL_BASED_OUTPATIENT_CLINIC_OR_DEPARTMENT_OTHER): Payer: Medicare Other

## 2012-02-20 ENCOUNTER — Emergency Department (HOSPITAL_BASED_OUTPATIENT_CLINIC_OR_DEPARTMENT_OTHER)
Admission: EM | Admit: 2012-02-20 | Discharge: 2012-02-20 | Disposition: A | Discharge status: Home / Self Care | Payer: Medicare Other | Attending: Emergency Medicine | Admitting: Emergency Medicine

## 2012-02-20 ENCOUNTER — Encounter (HOSPITAL_BASED_OUTPATIENT_CLINIC_OR_DEPARTMENT_OTHER): Payer: Self-pay | Admitting: Emergency Medicine

## 2012-02-20 DIAGNOSIS — J189 Pneumonia, unspecified organism: Secondary | ICD-10-CM | POA: Insufficient documentation

## 2012-02-20 DIAGNOSIS — R0602 Shortness of breath: Secondary | ICD-10-CM | POA: Insufficient documentation

## 2012-02-20 DIAGNOSIS — R062 Wheezing: Secondary | ICD-10-CM | POA: Insufficient documentation

## 2012-02-20 DIAGNOSIS — R42 Dizziness and giddiness: Secondary | ICD-10-CM | POA: Insufficient documentation

## 2012-02-20 DIAGNOSIS — Z79899 Other long term (current) drug therapy: Secondary | ICD-10-CM | POA: Insufficient documentation

## 2012-02-20 DIAGNOSIS — I10 Essential (primary) hypertension: Secondary | ICD-10-CM | POA: Insufficient documentation

## 2012-02-20 HISTORY — DX: Unspecified asthma, uncomplicated: J45.909

## 2012-02-20 LAB — CBC WITH DIFFERENTIAL/PLATELET
Eosinophils Relative: 2 % (ref 0–5)
HCT: 43.3 % (ref 39.0–52.0)
Lymphocytes Relative: 14 % (ref 12–46)
Lymphs Abs: 1.3 10*3/uL (ref 0.7–4.0)
MCV: 87.1 fL (ref 78.0–100.0)
Monocytes Absolute: 0.8 10*3/uL (ref 0.1–1.0)
RBC: 4.97 MIL/uL (ref 4.22–5.81)
WBC: 9 10*3/uL (ref 4.0–10.5)

## 2012-02-20 LAB — BASIC METABOLIC PANEL
CO2: 26 mEq/L (ref 19–32)
Calcium: 9 mg/dL (ref 8.4–10.5)
Creatinine, Ser: 0.9 mg/dL (ref 0.50–1.35)
Glucose, Bld: 98 mg/dL (ref 70–99)

## 2012-02-20 MED ORDER — DEXTROSE 5 % IV SOLN
1.0000 g | Freq: Once | INTRAVENOUS | Status: AC
  Administered 2012-02-20: 1 g via INTRAVENOUS
  Filled 2012-02-20: qty 10

## 2012-02-20 MED ORDER — LEVOFLOXACIN 500 MG PO TABS: 500.0000 mg | ORAL_TABLET | Freq: Every day | ORAL | Status: DC

## 2012-02-20 MED ORDER — SODIUM CHLORIDE 0.9 % IV BOLUS (SEPSIS)
1000.0000 mL | Freq: Once | INTRAVENOUS | Status: AC
  Administered 2012-02-20: 1000 mL via INTRAVENOUS

## 2012-02-20 MED ORDER — ALBUTEROL SULFATE HFA 108 (90 BASE) MCG/ACT IN AERS
2.0000 | INHALATION_SPRAY | RESPIRATORY_TRACT | Status: DC | PRN
  Administered 2012-02-20: 2 via RESPIRATORY_TRACT
  Filled 2012-02-20: qty 6.7

## 2012-02-20 MED ORDER — ALBUTEROL SULFATE (5 MG/ML) 0.5% IN NEBU
2.5000 mg | INHALATION_SOLUTION | RESPIRATORY_TRACT | Status: DC
  Administered 2012-02-20: 2.5 mg via RESPIRATORY_TRACT
  Filled 2012-02-20: qty 0.5

## 2012-02-20 MED ORDER — IPRATROPIUM BROMIDE 0.02 % IN SOLN
0.5000 mg | RESPIRATORY_TRACT | Status: DC
  Administered 2012-02-20: 0.5 mg via RESPIRATORY_TRACT
  Filled 2012-02-20: qty 2.5

## 2012-02-20 NOTE — ED Notes (Signed)
Pt started with sore throat, progressed to nasal congestion and now has productive cough with some shortness of breath.  Pt states he feels dizzy and light headed at times and feels tired.

## 2012-02-20 NOTE — ED Provider Notes (Signed)
History     CSN: [id number]  Arrival date & time 02/20/12  1051   First MD Initiated Contact with Patient 02/20/12 1154      Chief Complaint  Patient presents with  . Cough  . Sore Throat  . Shortness of Breath    (Consider location/radiation/quality/duration/timing/severity/associated sxs/prior treatment) HPI Comments: Patient presents with cough and chest congestion. He states it initially started about 6 weeks ago with a sore throat that went down into his chest he had a productive cough for about 4 weeks it got better for about 2 weeks and then last week it started back again with a sore throat going down into his chest with a productive cough. He also has some intermittent shortness of breath. Over the last 2 days she's felt dizzy and lightheaded and fatigued all over. He has had about a 20 pound weight loss over last 2-3 months but states he has been exercising more and trying to lose weight. He denies any chest pain or tightness. Denies any pleuritic-type pain. Denies any leg pain or swelling. He does have a history of asthma in the past but has not used an inhaler for several years. He denies any fevers. Denies any nausea vomiting or diarrhea  Patient is a 66 y.o. male presenting with cough, pharyngitis, and shortness of breath. The history is provided by the patient.  Cough This is a new problem. Associated symptoms include rhinorrhea, sore throat and shortness of breath. Pertinent negatives include no chest pain, no chills and no headaches.  Sore Throat Associated symptoms include shortness of breath. Pertinent negatives include no chest pain, no abdominal pain and no headaches.  Shortness of Breath  Associated symptoms include rhinorrhea, sore throat, cough and shortness of breath. Pertinent negatives include no chest pain and no fever.    Past Medical History  Diagnosis Date  . Hypertension   . Thyroid disease   . Hyperlipemia   . GERD (gastroesophageal reflux disease)     . Asthma     Past Surgical History  Procedure Date  . Stomach surgery   . Hernia repair   . Appendectomy   . Abdominal surgery   . Back surgery     cspine  . Foot amputation   . Hand amputation     Family History  Problem Relation Age of Onset  . Parkinsonism Mother   . Osteoporosis Mother   . Cancer Father     prostate,  skin  . Hyperlipidemia Father   . Hypertension Father   . Asthma Son   . Osteoporosis Paternal Aunt   . Alzheimer's disease Paternal Uncle   . Osteoporosis Maternal Grandmother   . Cancer Maternal Grandmother     lung  . Heart disease Paternal Grandmother     MI  . Heart disease Paternal Grandfather     MI    History  Substance Use Topics  . Smoking status: Former Smoker -- 1.0 packs/day for 35 years    Quit date: 08/18/1996  . Smokeless tobacco: Never Used  . Alcohol Use: 7.2 oz/week    12 Cans of beer per week      Review of Systems  Constitutional: Positive for appetite change and fatigue. Negative for fever, chills and diaphoresis.  HENT: Positive for congestion, sore throat and rhinorrhea. Negative for sneezing.   Eyes: Negative.   Respiratory: Positive for cough and shortness of breath. Negative for chest tightness.   Cardiovascular: Negative for chest pain and leg swelling.  Gastrointestinal:  Negative for nausea, vomiting, abdominal pain, diarrhea and blood in stool.  Genitourinary: Negative for frequency, hematuria, flank pain and difficulty urinating.  Musculoskeletal: Negative for back pain and arthralgias.  Skin: Negative for rash.  Neurological: Positive for dizziness and light-headedness. Negative for speech difficulty, weakness, numbness and headaches.    Allergies  Codeine  Home Medications   Current Outpatient Rx  Name Route Sig Dispense Refill  . ASPIRIN 81 MG PO TBEC Oral Take 81 mg by mouth daily.      . ATORVASTATIN CALCIUM 10 MG PO TABS Oral Take 1 tablet (10 mg total) by mouth daily. 90 tablet 3  . CALCIUM  CARBONATE-VITAMIN D 600-400 MG-UNIT PO CHEW Oral Chew 1 tablet by mouth daily.    . DUTASTERIDE 0.5 MG PO CAPS Oral Take 0.5 mg by mouth daily.    . OMEGA-3 FATTY ACIDS 1000 MG PO CAPS Oral Take 3 g by mouth daily.    [name] Kitchen LEVOTHYROXINE SODIUM 75 MCG PO TABS  TAKE 1 TABLET DAILY 90 tablet 1  . LISINOPRIL-HYDROCHLOROTHIAZIDE 10-12.5 MG PO TABS  TAKE 1 TABLET DAILY 90 tablet 1  . MULTI-VITAMIN/MINERALS PO TABS Oral Take 1 tablet by mouth daily.    [name] Kitchen OMEPRAZOLE 20 MG PO CPDR  TAKE 1 CAPSULE DAILY 90 capsule 1  . VITAMIN C 500 MG PO TABS Oral Take 500 mg by mouth daily.    [name] Kitchen VITAMIN E 400 UNITS PO CAPS Oral Take 400 Units by mouth daily.    . CHLORPHENIRAMINE MALEATE 4 MG PO TABS Oral Take 1 tablet (4 mg total) by mouth 2 (two) times daily as needed for allergies. 14 tablet 0  . LEVOFLOXACIN 500 MG PO TABS Oral Take 1 tablet (500 mg total) by mouth daily. 7 tablet 0  . SILDENAFIL CITRATE 50 MG PO TABS Oral Take 1 tablet (50 mg total) by mouth daily as needed for erectile dysfunction. 10 tablet 0    BP 112/66  Pulse 64  Temp 98.6 F (37 C) (Oral)  Resp 15  SpO2 98%  Physical Exam  Constitutional: He is oriented to person, place, and time. He appears well-developed and well-nourished.  HENT:  Head: Normocephalic and atraumatic.  Eyes: Pupils are equal, round, and reactive to light.  Neck: Normal range of motion. Neck supple.  Cardiovascular: Normal rate, regular rhythm and normal heart sounds.   Pulmonary/Chest: Effort normal. No respiratory distress. He has wheezes. He has no rales. He exhibits no tenderness.       Patient with decreased breath sounds bilaterally. He has rhonchi bilaterally an extra 20 wheezing bilaterally  Abdominal: Soft. Bowel sounds are normal. There is no tenderness. There is no rebound and no guarding.  Musculoskeletal: Normal range of motion. He exhibits no edema.  Lymphadenopathy:    He has no cervical adenopathy.  Neurological: He is alert and oriented to person,  place, and time.  Skin: Skin is warm and dry. No rash noted.  Psychiatric: He has a normal mood and affect.    ED Course  Procedures (including critical care time)   Date: 02/20/2012  Rate: 61  Rhythm: normal sinus rhythm  QRS Axis: normal  Intervals: normal  ST/T Wave abnormalities: nonspecific ST/T changes  Conduction Disutrbances:none  Narrative Interpretation:   Old EKG Reviewed: none available  Results for orders placed during the hospital encounter of 02/20/12  CBC WITH DIFFERENTIAL      Component Value Range   WBC 9.0  4.0 - 10.5 K/uL   RBC 4.97  4.22 - 5.81 MIL/uL   Hemoglobin 14.8  13.0 - 17.0 g/dL   HCT 28.4  13.2 - 44.0 %   MCV 87.1  78.0 - 100.0 fL   MCH 29.8  26.0 - 34.0 pg   MCHC 34.2  30.0 - 36.0 g/dL   RDW 10.2  72.5 - 36.6 %   Platelets 213  150 - 400 K/uL   Neutrophils Relative 74  43 - 77 %   Neutro Abs 6.7  1.7 - 7.7 K/uL   Lymphocytes Relative 14  12 - 46 %   Lymphs Abs 1.3  0.7 - 4.0 K/uL   Monocytes Relative 9  3 - 12 %   Monocytes Absolute 0.8  0.1 - 1.0 K/uL   Eosinophils Relative 2  0 - 5 %   Eosinophils Absolute 0.2  0.0 - 0.7 K/uL   Basophils Relative 0  0 - 1 %   Basophils Absolute 0.0  0.0 - 0.1 K/uL  BASIC METABOLIC PANEL      Component Value Range   Sodium 137  135 - 145 mEq/L   Potassium 4.1  3.5 - 5.1 mEq/L   Chloride 101  96 - 112 mEq/L   CO2 26  19 - 32 mEq/L   Glucose, Bld 98  70 - 99 mg/dL   BUN 17  6 - 23 mg/dL   Creatinine, Ser 4.40  0.50 - 1.35 mg/dL   Calcium 9.0  8.4 - 34.7 mg/dL   GFR calc non Af Amer 87 (*) >90 mL/min   GFR calc Af Amer >90  >90 mL/min  PRO B NATRIURETIC PEPTIDE      Component Value Range   Pro B Natriuretic peptide (BNP) 91.2  0 - 125 pg/mL   Dg Chest 2 View  02/20/2012  *RADIOLOGY REPORT*  Clinical Data: Cough, chest pain.  CHEST - 2 VIEW  Comparison: 02/13/2011  Findings: Vascular clips near the GE junction.  Coarse bibasilar and perihilar attenuated bronchovascular markings.  Focal airspace  infiltrate in the lingula.  No effusion.  Heart size normal. Regional bones unremarkable.  IMPRESSION:  Focal lingular airspace opacities suggesting pneumonia.  Consider follow-up to confirm appropriate resolution and exclude neoplasm.   Original Report Authenticated By: Osa Craver, M.D.       1. Community acquired pneumonia       MDM  Patient with pneumonia. His oxygen saturations are normal. He is well-appearing. He did not feel like he needs to be admitted to the hospital. He was given a dose of Rocephin here in the emergency department and I gave him pressures were for Levaquin. I advised him to follow up his primary care physician on Tuesday or return here as needed for any worsening symptoms. He's also dispensed an albuterol MDI. He was given a nebulizer treatment here and felt like he was doing better after the treatment. Advised if he has any worsening symptoms to return to the emergency department        Rolan Bucco, MD 02/20/12 1511

## 2012-02-22 ENCOUNTER — Telehealth: Payer: Self-pay | Admitting: Family Medicine

## 2012-02-22 NOTE — Telephone Encounter (Signed)
Message copied by Verner Chol on Tue Feb 22, 2012 11:12 AM ------      Message from: Arnette Norris      Created: Tue Feb 22, 2012 10:51 AM      Contact: LAUREL SMELTZ can put him in on Thursday on the DOD schedule a 1 pm.    KP            ----- Message -----         From: Theodis Sato McDaniels         Sent: 02/22/2012   9:42 AM           To: Arnette Norris, CMA            Requires ED follow up for pneumonia per visit 9.1.13 below copied from chart AVS from ED                  Follow-up Information          Follow up with Loreen Freud, DO. Schedule an appointment as soon as possible for a visit in 2 days.             Where would you like him, I can call him back to schedule      He stated they told him he would need a follow up chest xray after 2-3days of the Anti-biotics

## 2012-02-24 ENCOUNTER — Encounter: Payer: Self-pay | Admitting: Family Medicine

## 2012-02-24 ENCOUNTER — Ambulatory Visit (HOSPITAL_BASED_OUTPATIENT_CLINIC_OR_DEPARTMENT_OTHER)
Admission: RE | Admit: 2012-02-24 | Discharge: 2012-02-24 | Disposition: A | Discharge status: Home / Self Care | Payer: Medicare Other | Source: Ambulatory Visit | Attending: Family Medicine | Admitting: Family Medicine

## 2012-02-24 ENCOUNTER — Ambulatory Visit (INDEPENDENT_AMBULATORY_CARE_PROVIDER_SITE_OTHER): Payer: Medicare Other | Admitting: Family Medicine

## 2012-02-24 VITALS — BP 110/82 | HR 83 | Temp 98.9°F | Wt 180.0 lb

## 2012-02-24 DIAGNOSIS — J438 Other emphysema: Secondary | ICD-10-CM | POA: Insufficient documentation

## 2012-02-24 DIAGNOSIS — J189 Pneumonia, unspecified organism: Secondary | ICD-10-CM | POA: Insufficient documentation

## 2012-02-24 DIAGNOSIS — J45909 Unspecified asthma, uncomplicated: Secondary | ICD-10-CM

## 2012-02-24 DIAGNOSIS — R062 Wheezing: Secondary | ICD-10-CM

## 2012-02-24 DIAGNOSIS — R0789 Other chest pain: Secondary | ICD-10-CM

## 2012-02-24 LAB — CBC WITH DIFFERENTIAL/PLATELET
Eosinophils Relative: 1.4 % (ref 0.0–5.0)
HCT: 47.9 % (ref 39.0–52.0)
Lymphs Abs: 2 10*3/uL (ref 0.7–4.0)
MCV: 89.7 fl (ref 78.0–100.0)
Monocytes Absolute: 0.8 10*3/uL (ref 0.1–1.0)
Neutro Abs: 7.8 10*3/uL — ABNORMAL HIGH (ref 1.4–7.7)
Platelets: 237 10*3/uL (ref 150.0–400.0)
RDW: 13.8 % (ref 11.5–14.6)
WBC: 10.8 10*3/uL — ABNORMAL HIGH (ref 4.5–10.5)

## 2012-02-24 LAB — HEPATIC FUNCTION PANEL
ALT: 24 U/L (ref 0–53)
Albumin: 4.1 g/dL (ref 3.5–5.2)
Total Bilirubin: 0.8 mg/dL (ref 0.3–1.2)

## 2012-02-24 LAB — BASIC METABOLIC PANEL
BUN: 15 mg/dL (ref 6–23)
Chloride: 100 mEq/L (ref 96–112)
Glucose, Bld: 97 mg/dL (ref 70–99)
Potassium: 3.5 mEq/L (ref 3.5–5.1)

## 2012-02-24 MED ORDER — PREDNISONE 10 MG PO TABS: ORAL_TABLET | ORAL | Status: DC

## 2012-02-24 MED ORDER — METHYLPREDNISOLONE ACETATE 80 MG/ML IJ SUSP
80.0000 mg | Freq: Once | INTRAMUSCULAR | Status: AC
  Administered 2012-02-24: 80 mg via INTRAMUSCULAR

## 2012-02-24 MED ORDER — CEFTRIAXONE SODIUM 1 G IJ SOLR
1.0000 g | Freq: Once | INTRAMUSCULAR | Status: AC
  Administered 2012-02-24: 1 g via INTRAMUSCULAR

## 2012-02-24 MED ORDER — ALBUTEROL SULFATE (5 MG/ML) 0.5% IN NEBU
2.5000 mg | INHALATION_SOLUTION | Freq: Four times a day (QID) | RESPIRATORY_TRACT | Status: DC | PRN
  Administered 2012-02-24: 2.5 mg via RESPIRATORY_TRACT

## 2012-02-24 MED ORDER — IPRATROPIUM BROMIDE 0.02 % IN SOLN
0.5000 mg | Freq: Four times a day (QID) | RESPIRATORY_TRACT | Status: DC | PRN
  Administered 2012-02-24: 0.5 mg via RESPIRATORY_TRACT

## 2012-02-24 MED ORDER — LEVOFLOXACIN 750 MG PO TABS: 750.0000 mg | ORAL_TABLET | Freq: Every day | ORAL | Status: AC

## 2012-02-24 NOTE — Patient Instructions (Addendum)

## 2012-02-25 ENCOUNTER — Telehealth: Payer: Self-pay | Admitting: *Deleted

## 2012-02-25 ENCOUNTER — Encounter: Payer: Self-pay | Admitting: Family Medicine

## 2012-02-25 NOTE — Telephone Encounter (Signed)
Pt wife called to advise the pt has received his nebulizer equipment however they noted he needs the nebulizer solution to be sent to the pharmacy for the pt, call was transferred to CMA Arman Bogus for further assistance

## 2012-02-25 NOTE — Telephone Encounter (Signed)
Discussed with wife and I will leave samples of the Duoneb for her to pickup tonight since [facility] was unable to deliver the medication today. The patient is feeling some better but not 100%. That treatments are helping. Ok per Dr.Lowne to leave the samples.     KP

## 2012-02-25 NOTE — Progress Notes (Signed)
  Subjective:     Russell Brown is a 66 y.o. male here for evaluation of a cough. Onset of symptoms was 6 weeks ago. Symptoms have been gradually worsening since that time. The cough is painful and productive and is aggravated by infection, stress, pollens and reclining position. Associated symptoms include: chills, fever, postnasal drip, shortness of breath, sputum production and wheezing. Patient does have a history of asthma. Patient does have a history of environmental allergens. Patient has not traveled recently. Patient does not have a history of smoking. Patient has had a previous chest x-ray. Patient has not had a PPD done.  Pt was dx with pneumonia Sunday and put on Levaquin.  He states he feels no better.     The following portions of the patient's history were reviewed and updated as appropriate: allergies, current medications, past family history, past medical history, past social history, past surgical history and problem list.  Review of Systems Pertinent items are noted in HPI.    Objective:    Oxygen saturation 97% on room air BP 110/82  Pulse 83  Temp 98.9 F (37.2 C) (Oral)  Wt 180 lb (81.647 kg)  SpO2 97% General appearance: alert, cooperative, appears stated age and no distress Ears: normal TM's and external ear canals both ears Nose: Nares normal. Septum midline. Mucosa normal. No drainage or sinus tenderness. Throat: lips, mucosa, and tongue normal; teeth and gums normal Neck: no adenopathy, supple, symmetrical, trachea midline and thyroid not enlarged, symmetric, no tenderness/mass/nodules Lungs: diminished breath sounds bibasilar and rales bibasilar Heart: S1, S2 normal    Assessment:    Pneumonia    Plan:    Antibiotics per medication orders. Antitussives per medication orders. Avoid exposure to tobacco smoke and fumes. Call if shortness of breath worsens, blood in sputum, change in character of cough, development of fever or chills, inability to  maintain nutrition and hydration. Avoid exposure to tobacco smoke and fumes. pred taper and neb given to pt

## 2012-03-01 ENCOUNTER — Other Ambulatory Visit: Payer: Self-pay

## 2012-03-01 MED ORDER — IPRATROPIUM BROMIDE 0.02 % IN SOLN: 500.0000 ug | Freq: Four times a day (QID) | RESPIRATORY_TRACT | Status: DC

## 2012-03-02 ENCOUNTER — Encounter: Payer: Self-pay | Admitting: Family Medicine

## 2012-03-02 ENCOUNTER — Ambulatory Visit (INDEPENDENT_AMBULATORY_CARE_PROVIDER_SITE_OTHER): Payer: Medicare Other | Admitting: Family Medicine

## 2012-03-02 ENCOUNTER — Ambulatory Visit: Payer: Medicare Other | Admitting: Family Medicine

## 2012-03-02 VITALS — BP 114/80 | HR 80 | Temp 98.7°F | Wt 179.4 lb

## 2012-03-02 DIAGNOSIS — H9190 Unspecified hearing loss, unspecified ear: Secondary | ICD-10-CM

## 2012-03-02 DIAGNOSIS — H9319 Tinnitus, unspecified ear: Secondary | ICD-10-CM

## 2012-03-02 DIAGNOSIS — J189 Pneumonia, unspecified organism: Secondary | ICD-10-CM

## 2012-03-02 DIAGNOSIS — R51 Headache: Secondary | ICD-10-CM

## 2012-03-02 MED ORDER — TRAMADOL HCL 50 MG PO TABS: 50.0000 mg | ORAL_TABLET | Freq: Three times a day (TID) | ORAL | Status: AC | PRN

## 2012-03-03 ENCOUNTER — Encounter: Payer: Self-pay | Admitting: Family Medicine

## 2012-03-03 DIAGNOSIS — J189 Pneumonia, unspecified organism: Secondary | ICD-10-CM | POA: Insufficient documentation

## 2012-03-03 DIAGNOSIS — H9319 Tinnitus, unspecified ear: Secondary | ICD-10-CM | POA: Insufficient documentation

## 2012-03-03 HISTORY — DX: Pneumonia, unspecified organism: J18.9

## 2012-03-03 LAB — BASIC METABOLIC PANEL
GFR: 81.28 mL/min (ref >60.00)
Glucose, Bld: 107 mg/dL — ABNORMAL HIGH (ref 70–99)
Potassium: 4.6 mEq/L (ref 3.5–5.1)
Sodium: 135 mEq/L (ref 135–145)

## 2012-03-03 LAB — CBC WITH DIFFERENTIAL/PLATELET
Basophils Relative: 0 % (ref 0.0–3.0)
Eosinophils Relative: 0.1 % (ref 0.0–5.0)
HCT: 48.1 % (ref 39.0–52.0)
Hemoglobin: 15.7 g/dL (ref 13.0–17.0)
Lymphocytes Relative: 7.4 % — ABNORMAL LOW (ref 12.0–46.0)
Lymphs Abs: 0.9 10*3/uL (ref 0.7–4.0)
Monocytes Relative: 5.5 % (ref 3.0–12.0)
Neutro Abs: 10.6 10*3/uL — ABNORMAL HIGH (ref 1.4–7.7)
RBC: 5.32 Mil/uL (ref 4.22–5.81)
RDW: 14.4 % (ref 11.5–14.6)
WBC: 12.2 10*3/uL — ABNORMAL HIGH (ref 4.5–10.5)

## 2012-03-03 LAB — HEPATIC FUNCTION PANEL
ALT: 35 U/L (ref 0–53)
AST: 24 U/L (ref 0–37)
Albumin: 4.1 g/dL (ref 3.5–5.2)
Alkaline Phosphatase: 69 U/L (ref 39–117)

## 2012-03-03 LAB — SEDIMENTATION RATE: Sed Rate: 3 mm/hr (ref 0–22)

## 2012-03-03 NOTE — Assessment & Plan Note (Signed)
Improved Finish abx and prednisone Recheck xray next week

## 2012-03-03 NOTE — Assessment & Plan Note (Signed)
Refer to ENT

## 2012-03-03 NOTE — Progress Notes (Signed)
  Subjective:    Patient ID: Russell Brown, male    DOB: January 24, 1946, 66 y.o.   MRN: [id number]  HPI Pt here with his wife for f/u pneumonia. He is feeling much better but still coughing and is tired.  Pt also c/o ringing in his ears that has gone on for a long time---he is also hard of hearing. He is also having headaches --esp in temple area.  They are frequent and difficult to get rid of. No sob, cp, n/v.    Review of Systems    as above Objective:   Physical Exam  Constitutional: He is oriented to person, place, and time. He appears well-developed and well-nourished.  HENT:  Head: Normocephalic and atraumatic.  Right Ear: External ear normal.  Left Ear: External ear normal.  Neck: Normal range of motion. Neck supple.  Cardiovascular: Normal rate.   Pulmonary/Chest: Effort normal. No respiratory distress. He has no wheezes. He has rales.  Neurological: He is alert and oriented to person, place, and time.  Psychiatric: He has a normal mood and affect. His behavior is normal. Judgment and thought content normal.          Assessment & Plan:

## 2012-03-08 ENCOUNTER — Ambulatory Visit (HOSPITAL_BASED_OUTPATIENT_CLINIC_OR_DEPARTMENT_OTHER)
Admission: RE | Admit: 2012-03-08 | Discharge: 2012-03-08 | Disposition: A | Discharge status: Home / Self Care | Payer: Medicare Other | Source: Ambulatory Visit | Attending: Family Medicine | Admitting: Family Medicine

## 2012-03-08 DIAGNOSIS — J189 Pneumonia, unspecified organism: Secondary | ICD-10-CM | POA: Insufficient documentation

## 2012-03-08 DIAGNOSIS — J438 Other emphysema: Secondary | ICD-10-CM | POA: Insufficient documentation

## 2012-04-11 ENCOUNTER — Other Ambulatory Visit: Payer: Self-pay | Admitting: Family Medicine

## 2012-04-11 DIAGNOSIS — E785 Hyperlipidemia, unspecified: Secondary | ICD-10-CM

## 2012-04-11 MED ORDER — OMEPRAZOLE 20 MG PO CPDR: 20.0000 mg | DELAYED_RELEASE_CAPSULE | Freq: Every day | ORAL | Status: DC

## 2012-04-11 MED ORDER — ATORVASTATIN CALCIUM 10 MG PO TABS: 10.0000 mg | ORAL_TABLET | Freq: Every day | ORAL | Status: DC

## 2012-04-11 MED ORDER — LISINOPRIL-HYDROCHLOROTHIAZIDE 10-12.5 MG PO TABS: 1.0000 | ORAL_TABLET | Freq: Every day | ORAL | Status: DC

## 2012-04-11 MED ORDER — LEVOTHYROXINE SODIUM 75 MCG PO TABS: 75.0000 ug | ORAL_TABLET | Freq: Every day | ORAL | Status: DC

## 2012-04-11 NOTE — Telephone Encounter (Signed)
refills x 4 -- last ov 9.12.13-follow up REQUESTING 90-day supply  1-SYNTHROID, LEVOTHROID 75 MCG TAKE 1 TABLET DAILY  2-Atorvastatin (Tab) 10 MG Take 1 tablet (10 mg total) by mouth daily.  3-Omeprazole 20 MG TAKE 1 CAPSULE DAILY  4-Lisinopril-HCTZ (Tab)  10-12.5 MG TAKE 1 TABLET DAILY

## 2012-04-28 ENCOUNTER — Encounter: Payer: Self-pay | Admitting: Internal Medicine

## 2012-04-28 ENCOUNTER — Encounter: Payer: Self-pay | Admitting: Gastroenterology

## 2012-06-20 ENCOUNTER — Ambulatory Visit (INDEPENDENT_AMBULATORY_CARE_PROVIDER_SITE_OTHER): Payer: Medicare Other | Admitting: Family Medicine

## 2012-06-20 ENCOUNTER — Encounter: Payer: Self-pay | Admitting: Family Medicine

## 2012-06-20 ENCOUNTER — Ambulatory Visit (HOSPITAL_COMMUNITY): Admission: RE | Admit: 2012-06-20 | Payer: Medicare Other | Source: Ambulatory Visit

## 2012-06-20 VITALS — BP 114/72 | HR 80 | Temp 98.1°F | Wt 185.8 lb

## 2012-06-20 DIAGNOSIS — S069X1A Unspecified intracranial injury with loss of consciousness of 30 minutes or less, initial encounter: Secondary | ICD-10-CM | POA: Insufficient documentation

## 2012-06-20 DIAGNOSIS — S069X9A Unspecified intracranial injury with loss of consciousness of unspecified duration, initial encounter: Secondary | ICD-10-CM | POA: Insufficient documentation

## 2012-06-20 DIAGNOSIS — S060X9A Concussion with loss of consciousness of unspecified duration, initial encounter: Secondary | ICD-10-CM

## 2012-06-20 DIAGNOSIS — R413 Other amnesia: Secondary | ICD-10-CM

## 2012-06-20 DIAGNOSIS — F419 Anxiety disorder, unspecified: Secondary | ICD-10-CM | POA: Insufficient documentation

## 2012-06-20 DIAGNOSIS — F4310 Post-traumatic stress disorder, unspecified: Secondary | ICD-10-CM | POA: Insufficient documentation

## 2012-06-20 LAB — CBC WITH DIFFERENTIAL/PLATELET
Basophils Relative: 0.5 % (ref 0.0–3.0)
Eosinophils Absolute: 0.2 10*3/uL (ref 0.0–0.7)
Eosinophils Relative: 4.1 % (ref 0.0–5.0)
Lymphocytes Relative: 17.6 % (ref 12.0–46.0)
MCHC: 33.4 g/dL (ref 30.0–36.0)
MCV: 89.8 fl (ref 78.0–100.0)
Monocytes Absolute: 0.6 10*3/uL (ref 0.1–1.0)
Neutrophils Relative %: 68.6 % (ref 43.0–77.0)
Platelets: 205 10*3/uL (ref 150.0–400.0)
RBC: 4.97 Mil/uL (ref 4.22–5.81)
WBC: 6 10*3/uL (ref 4.5–10.5)

## 2012-06-20 LAB — HEPATIC FUNCTION PANEL
Alkaline Phosphatase: 65 U/L (ref 39–117)
Bilirubin, Direct: 0 mg/dL (ref 0.0–0.3)
Total Bilirubin: 0.7 mg/dL (ref 0.3–1.2)
Total Protein: 6.8 g/dL (ref 6.0–8.3)

## 2012-06-20 LAB — POCT URINALYSIS DIPSTICK
Blood, UA: NEGATIVE
Glucose, UA: NEGATIVE
Ketones, UA: NEGATIVE
Spec Grav, UA: 1.01

## 2012-06-20 LAB — BASIC METABOLIC PANEL
BUN: 17 mg/dL (ref 6–23)
Calcium: 8.8 mg/dL (ref 8.4–10.5)
Creatinine, Ser: 0.9 mg/dL (ref 0.4–1.5)

## 2012-06-20 LAB — TSH: TSH: 1.08 u[IU]/mL (ref 0.35–5.50)

## 2012-06-20 NOTE — Assessment & Plan Note (Signed)
Mri brain Refer to neuro ----pt requesting to see neuro in [place]--Dr [facility] Check labs MMSE  30/30

## 2012-06-20 NOTE — Progress Notes (Signed)
  Subjective:    Patient ID: HASHIM EICHHORST, male    DOB: [birth year], 66 y.o.   MRN: [id number]  HPI Pt here c/o memory problems that are progressively getting worse.  He also acts confused at times.   It is causing problems with his wife and the rest of his family.  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  He ordered a lot of double presents for christmas because he forgot he ordered it.    He said he wife states it got worse after a fall -- he thinks was a year ago--- where he did lose consciousness for short period--exact time unknown.   He never went anywhere to be evaluated.  This is the first time he has mentioned it.   His wife gave him a list of things to go over which is why he can remember it now.     Review of Systems As above    Objective:   Physical Exam BP 114/72  Pulse 80  Temp 98.1 F (36.7 C) (Oral)  Wt 185 lb 12.8 oz (84.278 kg)  SpO2 97% General appearance: alert, cooperative, appears stated age and no distress Eyes: conjunctivae/corneas clear. PERRL, EOM's intact. Fundi benign. Ears: normal TM's and external ear canals both ears Nose: Nares normal. Septum midline. Mucosa normal. No drainage or sinus tenderness. Throat: lips, mucosa, and tongue normal; teeth and gums normal Neck: no adenopathy, no carotid bruit, no JVD, supple, symmetrical, trachea midline and thyroid not enlarged, symmetric, no tenderness/mass/nodules Lungs: clear to auscultation bilaterally Neurologic: Alert and oriented X 3, normal strength and tone. Normal symmetric reflexes. Normal coordination and gait                   MMSE  30/30      Assessment & Plan:

## 2012-06-20 NOTE — Assessment & Plan Note (Signed)
Refer to Neuro Check labs

## 2012-06-20 NOTE — Addendum Note (Signed)
Addended by: Arnette Norris on: 06/20/2012 03:44 PM   Modules accepted: Orders

## 2012-06-23 ENCOUNTER — Ambulatory Visit (HOSPITAL_COMMUNITY)
Admission: RE | Admit: 2012-06-23 | Discharge: 2012-06-23 | Disposition: A | Discharge status: Home / Self Care | Payer: 59 | Source: Ambulatory Visit | Attending: Family Medicine | Admitting: Family Medicine

## 2012-06-23 ENCOUNTER — Ambulatory Visit (HOSPITAL_COMMUNITY): Payer: Medicare Other

## 2012-06-23 DIAGNOSIS — S069X1A Unspecified intracranial injury with loss of consciousness of 30 minutes or less, initial encounter: Secondary | ICD-10-CM

## 2012-06-23 DIAGNOSIS — R413 Other amnesia: Secondary | ICD-10-CM

## 2012-06-23 DIAGNOSIS — F29 Unspecified psychosis not due to a substance or known physiological condition: Secondary | ICD-10-CM | POA: Insufficient documentation

## 2012-06-23 DIAGNOSIS — G319 Degenerative disease of nervous system, unspecified: Secondary | ICD-10-CM | POA: Insufficient documentation

## 2012-06-23 DIAGNOSIS — H9190 Unspecified hearing loss, unspecified ear: Secondary | ICD-10-CM | POA: Insufficient documentation

## 2012-06-23 DIAGNOSIS — H538 Other visual disturbances: Secondary | ICD-10-CM | POA: Insufficient documentation

## 2012-06-23 DIAGNOSIS — S069X9A Unspecified intracranial injury with loss of consciousness of unspecified duration, initial encounter: Secondary | ICD-10-CM

## 2012-06-26 ENCOUNTER — Other Ambulatory Visit: Payer: Self-pay

## 2012-07-03 ENCOUNTER — Other Ambulatory Visit: Payer: Self-pay | Admitting: Family Medicine

## 2012-07-03 MED ORDER — LEVOTHYROXINE SODIUM 75 MCG PO TABS: 75.0000 ug | ORAL_TABLET | Freq: Every day | ORAL | Status: DC

## 2012-07-03 MED ORDER — OMEPRAZOLE 20 MG PO CPDR: 20.0000 mg | DELAYED_RELEASE_CAPSULE | Freq: Every day | ORAL | Status: DC

## 2012-07-03 MED ORDER — LISINOPRIL-HYDROCHLOROTHIAZIDE 10-12.5 MG PO TABS: 1.0000 | ORAL_TABLET | Freq: Every day | ORAL | Status: DC

## 2012-10-12 ENCOUNTER — Encounter: Payer: Self-pay | Admitting: Internal Medicine

## 2012-10-23 ENCOUNTER — Encounter: Payer: Self-pay | Admitting: Family Medicine

## 2012-10-24 ENCOUNTER — Encounter: Payer: Self-pay | Admitting: Family Medicine

## 2012-10-24 ENCOUNTER — Telehealth: Payer: Self-pay

## 2012-10-24 DIAGNOSIS — E785 Hyperlipidemia, unspecified: Secondary | ICD-10-CM

## 2012-10-24 DIAGNOSIS — I10 Essential (primary) hypertension: Secondary | ICD-10-CM

## 2012-10-24 NOTE — Telephone Encounter (Signed)
Message copied by Arnette Norris on Tue Oct 24, 2012 11:57 AM ------      Message from: Marshell Garfinkel      Created: Tue Oct 24, 2012  9:42 AM       Patient is coming 10/31/12 for labs to get cholesterol meds filled. Can you place orders please? ------

## 2012-10-31 ENCOUNTER — Other Ambulatory Visit (INDEPENDENT_AMBULATORY_CARE_PROVIDER_SITE_OTHER): Payer: Medicare Other

## 2012-10-31 DIAGNOSIS — I10 Essential (primary) hypertension: Secondary | ICD-10-CM

## 2012-10-31 DIAGNOSIS — E785 Hyperlipidemia, unspecified: Secondary | ICD-10-CM

## 2012-10-31 LAB — HEPATIC FUNCTION PANEL
ALT: 22 U/L (ref 0–53)
Alkaline Phosphatase: 70 U/L (ref 39–117)
Bilirubin, Direct: 0 mg/dL (ref 0.0–0.3)
Total Protein: 6.4 g/dL (ref 6.0–8.3)

## 2012-10-31 LAB — LIPID PANEL: Cholesterol: 148 mg/dL (ref 0–200)

## 2012-10-31 LAB — BASIC METABOLIC PANEL
BUN: 13 mg/dL (ref 6–23)
Creatinine, Ser: 1.1 mg/dL (ref 0.4–1.5)
GFR: 74.9 mL/min (ref >60.00)

## 2012-11-07 ENCOUNTER — Encounter: Payer: Self-pay | Admitting: Family Medicine

## 2012-11-07 ENCOUNTER — Other Ambulatory Visit: Payer: Self-pay | Admitting: General Practice

## 2012-11-07 DIAGNOSIS — E785 Hyperlipidemia, unspecified: Secondary | ICD-10-CM

## 2012-11-07 MED ORDER — ATORVASTATIN CALCIUM 10 MG PO TABS: 10.0000 mg | ORAL_TABLET | Freq: Every day | ORAL | Status: DC

## 2013-01-24 ENCOUNTER — Other Ambulatory Visit: Payer: Self-pay

## 2013-01-25 ENCOUNTER — Encounter: Payer: Self-pay | Admitting: Family Medicine

## 2013-02-15 ENCOUNTER — Encounter: Payer: Self-pay | Admitting: Internal Medicine

## 2013-04-18 ENCOUNTER — Ambulatory Visit (INDEPENDENT_AMBULATORY_CARE_PROVIDER_SITE_OTHER): Payer: Medicare Other | Admitting: *Deleted

## 2013-04-18 DIAGNOSIS — Z23 Encounter for immunization: Secondary | ICD-10-CM

## 2013-04-19 ENCOUNTER — Ambulatory Visit (AMBULATORY_SURGERY_CENTER): Payer: Self-pay

## 2013-04-19 VITALS — Ht 72.0 in | Wt 190.0 lb

## 2013-04-19 DIAGNOSIS — Z1211 Encounter for screening for malignant neoplasm of colon: Secondary | ICD-10-CM

## 2013-04-19 MED ORDER — MOVIPREP 100 G PO SOLR: 1.0000 | Freq: Once | ORAL | Status: DC

## 2013-04-23 ENCOUNTER — Encounter: Payer: Self-pay | Admitting: Internal Medicine

## 2013-05-08 ENCOUNTER — Encounter: Payer: Self-pay | Admitting: Family Medicine

## 2013-05-08 MED ORDER — SILDENAFIL CITRATE 100 MG PO TABS: 100.0000 mg | ORAL_TABLET | Freq: Every day | ORAL | Status: DC | PRN

## 2013-05-10 ENCOUNTER — Ambulatory Visit (AMBULATORY_SURGERY_CENTER): Payer: Medicare Other | Admitting: Internal Medicine

## 2013-05-10 ENCOUNTER — Encounter: Payer: Self-pay | Admitting: Internal Medicine

## 2013-05-10 VITALS — BP 129/74 | HR 51 | Temp 97.2°F | Resp 20 | Ht 72.0 in | Wt 190.0 lb

## 2013-05-10 DIAGNOSIS — D126 Benign neoplasm of colon, unspecified: Secondary | ICD-10-CM

## 2013-05-10 DIAGNOSIS — Z1211 Encounter for screening for malignant neoplasm of colon: Secondary | ICD-10-CM

## 2013-05-10 MED ORDER — SODIUM CHLORIDE 0.9 % IV SOLN: 500.0000 mL | INTRAVENOUS | Status: DC

## 2013-05-10 NOTE — Progress Notes (Signed)
Lidocaine-40mg IV prior to Propofol InductionPropofol given over incremental dosages 

## 2013-05-10 NOTE — Op Note (Signed)
[facility] [address] [place], [address]   COLONOSCOPY PROCEDURE REPORT  PATIENT: Russell Brown, Russell Brown  MR#: [id number] BIRTHDATE: August 24, 1945 , 67  yrs. old GENDER: Male ENDOSCOPIST: Roxy Cedar, MD REFERRED GE:[id number] Recall PROCEDURE DATE:  05/10/2013 PROCEDURE:   Colonoscopy with snare polypectomy x 2 First Screening Colonoscopy - Avg.  risk and is 50 yrs.  old or older - No.  Prior Negative Screening - Now for repeat screening. 10 or more years since last screening  History of Adenoma - Now for follow-up colonoscopy & has been > or = to 3 yrs.  N/A  Polyps Removed Today? Yes. ASA CLASS:   Class II INDICATIONS:average risk screening.   Negative index exam 2003 MEDICATIONS: MAC sedation, administered by CRNA and propofol (Diprivan) 350mg  IV DESCRIPTION OF PROCEDURE:   After the risks benefits and alternatives of the procedure were thoroughly explained, informed consent was obtained.  A digital rectal exam revealed internal hemorrhoids.   The LB [id number]  endoscope was introduced through the anus and advanced to the cecum, which was identified by both the appendix and ileocecal valve. No adverse events experienced.   The quality of the prep was good, using MoviPrep The instrument was then slowly withdrawn as the colon was fully examined.  COLON FINDINGS: Two diminutive polyps were found in the ascending colon and descending colon.  A polypectomy was performed with a cold snare.  The resection was complete and the polyp tissue was completely retrieved.   Moderate diverticulosis was noted The finding was in the left colon.   The colon mucosa was otherwise normal.  Retroflexed views revealed internal hemorrhoids. The time to cecum=3 minutes 48 seconds.  Withdrawal time=12 minutes 24 seconds.  The scope was withdrawn and the procedure completed. COMPLICATIONS: There were no complications.  ENDOSCOPIC IMPRESSION: 1.   Two diminutive polyps  in  the ascending colon and descending colon; polypectomy was performed with a cold snare 2.   Moderate diverticulosis was noted in the left colon 3.   The colon mucosa was otherwise normal  RECOMMENDATIONS: 1. Repeat colonoscopy in 5 years if polyp adenomatous; otherwise 10 years   eSigned:  Roxy Cedar, MD 05/10/2013 9:27 AM  revised  cc: Lelon Perla, DO and The Patient

## 2013-05-10 NOTE — Progress Notes (Signed)
Called to room to assist during endoscopic procedure.  Patient ID and intended procedure confirmed with present staff. Received instructions for my participation in the procedure from the performing physician.  

## 2013-05-10 NOTE — Progress Notes (Signed)
Patient did not experience any of the following events: a burn prior to discharge; a fall within the facility; wrong site/side/patient/procedure/implant event; or a hospital transfer or hospital admission upon discharge from the facility. (G8907) Patient did not have preoperative order for IV antibiotic SSI prophylaxis. (G8918)  

## 2013-05-10 NOTE — Patient Instructions (Signed)
FOLLOW DISCHARGE INSTRUCTIONS (BLUE AND GREEN SHEETS).YOU HAD AN ENDOSCOPIC PROCEDURE TODAY AT [facility]: Refer to the procedure report that was given to you for any specific questions about what was found during the examination.  If the procedure report does not answer your questions, please call your gastroenterologist to clarify.  If you requested that your care partner not be given the details of your procedure findings, then the procedure report has been included in a sealed envelope for you to review at your convenience later.  YOU SHOULD EXPECT: Some feelings of bloating in the abdomen. Passage of more gas than usual.  Walking can help get rid of the air that was put into your GI tract during the procedure and reduce the bloating. If you had a lower endoscopy (such as a colonoscopy or flexible sigmoidoscopy) you may notice spotting of blood in your stool or on the toilet paper. If you underwent a bowel prep for your procedure, then you may not have a normal bowel movement for a few days.  DIET: Your first meal following the procedure should be a light meal and then it is ok to progress to your normal diet.  A half-sandwich or bowl of soup is an example of a good first meal.  Heavy or fried foods are harder to digest and may make you feel nauseous or bloated.  Likewise meals heavy in dairy and vegetables can cause extra gas to form and this can also increase the bloating.  Drink plenty of fluids but you should avoid alcoholic beverages for 24 hours.  ACTIVITY: Your care partner should take you home directly after the procedure.  You should plan to take it easy, moving slowly for the rest of the day.  You can resume normal activity the day after the procedure however you should NOT DRIVE or use heavy machinery for 24 hours (because of the sedation medicines used during the test).    SYMPTOMS TO REPORT IMMEDIATELY: A gastroenterologist can be reached at any hour.  During normal  business hours, 8:30 AM to 5:00 PM Monday through Friday, call [phone].  After hours and on weekends, please call the GI answering service at [phone] who will take a message and have the physician on call contact you.   Following lower endoscopy (colonoscopy or flexible sigmoidoscopy):  Excessive amounts of blood in the stool  Significant tenderness or worsening of abdominal pains  Swelling of the abdomen that is new, acute  Fever of 100F or higher   FOLLOW UP: If any biopsies were taken you will be contacted by phone or by letter within the next 1-3 weeks.  Call your gastroenterologist if you have not heard about the biopsies in 3 weeks.  Our staff will call the home number listed on your records the next business day following your procedure to check on you and address any questions or concerns that you may have at that time regarding the information given to you following your procedure. This is a courtesy call and so if there is no answer at the home number and we have not heard from you through the emergency physician on call, we will assume that you have returned to your regular daily activities without incident.  SIGNATURES/CONFIDENTIALITY: You and/or your care partner have signed paperwork which will be entered into your electronic medical record.  These signatures attest to the fact that that the information above on your After Visit Summary has been reviewed and is understood.  Full responsibility of the confidentiality of this discharge information lies with you and/or your care-partner.   Polyp, diverticulosis and high fiber diet information given.  Dr. Marina Goodell will advise you about schedule for next colonoscopy after pathology results are reviewed.

## 2013-05-11 ENCOUNTER — Telehealth: Payer: Self-pay | Admitting: *Deleted

## 2013-05-11 NOTE — Telephone Encounter (Signed)
  Follow up Call-  Call back number 05/10/2013  Post procedure Call Back phone  # [phone]  Permission to leave phone message Yes     Patient questions:  Do you have a fever, pain , or abdominal swelling? no Pain Score  0 *  Have you tolerated food without any problems? yes  Have you been able to return to your normal activities? yes  Do you have any questions about your discharge instructions: Diet   no Medications  no Follow up visit  no  Do you have questions or concerns about your Care? no  Actions: * If pain score is 4 or above: No action needed, pain <4.

## 2013-05-13 ENCOUNTER — Other Ambulatory Visit: Payer: Self-pay | Admitting: Family Medicine

## 2013-05-15 ENCOUNTER — Encounter: Payer: Self-pay | Admitting: Internal Medicine

## 2013-05-25 ENCOUNTER — Other Ambulatory Visit: Payer: Self-pay | Admitting: Family Medicine

## 2013-07-08 ENCOUNTER — Other Ambulatory Visit: Payer: Self-pay | Admitting: Family Medicine

## 2013-08-22 ENCOUNTER — Telehealth: Payer: Self-pay

## 2013-08-22 NOTE — Telephone Encounter (Signed)
Left message for call back Identifiable  Flu- 03/06/13 Td- 05/22/09 PNA- 04/18/13 Z- 05/22/09 CCS- 05/10/13- Benign polyps, diverticulosis; repeat 04/2023

## 2013-08-22 NOTE — Telephone Encounter (Signed)
Medication List and allergies:  Updated and Reviewed  90 day supply/mail order: [place] [place] Local prescriptions:  [place] [id number] - [place], [place]  Immunization due:  UTD  A/P: No changes to personal, family or [place] Flu- 03/06/13  Td- 05/22/09  PNA- 04/18/13  Z- 05/22/09  CCS- 05/10/13- Benign polyps, diverticulosis; repeat 04/2023 PSA- not noted in chart    To discuss with provider: Nothing at this time.

## 2013-08-24 ENCOUNTER — Ambulatory Visit (INDEPENDENT_AMBULATORY_CARE_PROVIDER_SITE_OTHER): Payer: 59 | Admitting: Family Medicine

## 2013-08-24 ENCOUNTER — Encounter: Payer: Self-pay | Admitting: Family Medicine

## 2013-08-24 VITALS — BP 122/74 | HR 73 | Temp 98.2°F | Ht 73.5 in | Wt 188.6 lb

## 2013-08-24 DIAGNOSIS — F411 Generalized anxiety disorder: Secondary | ICD-10-CM

## 2013-08-24 DIAGNOSIS — I10 Essential (primary) hypertension: Secondary | ICD-10-CM

## 2013-08-24 DIAGNOSIS — F32A Depression, unspecified: Secondary | ICD-10-CM

## 2013-08-24 DIAGNOSIS — E039 Hypothyroidism, unspecified: Secondary | ICD-10-CM

## 2013-08-24 DIAGNOSIS — F3289 Other specified depressive episodes: Secondary | ICD-10-CM

## 2013-08-24 DIAGNOSIS — Z0000 Encounter for general adult medical examination without abnormal findings: Secondary | ICD-10-CM

## 2013-08-24 DIAGNOSIS — E785 Hyperlipidemia, unspecified: Secondary | ICD-10-CM

## 2013-08-24 DIAGNOSIS — J45909 Unspecified asthma, uncomplicated: Secondary | ICD-10-CM

## 2013-08-24 DIAGNOSIS — F329 Major depressive disorder, single episode, unspecified: Secondary | ICD-10-CM

## 2013-08-24 LAB — CBC WITH DIFFERENTIAL/PLATELET
Basophils Absolute: 0 10*3/uL (ref 0.0–0.1)
Basophils Relative: 0.2 % (ref 0.0–3.0)
EOS ABS: 0.1 10*3/uL (ref 0.0–0.7)
Eosinophils Relative: 2 % (ref 0.0–5.0)
HCT: 46.7 % (ref 39.0–52.0)
HEMOGLOBIN: 15.5 g/dL (ref 13.0–17.0)
LYMPHS PCT: 19.3 % (ref 12.0–46.0)
Lymphs Abs: 1.3 10*3/uL (ref 0.7–4.0)
MCHC: 33.2 g/dL (ref 30.0–36.0)
MCV: 88.8 fl (ref 78.0–100.0)
Monocytes Absolute: 0.5 10*3/uL (ref 0.1–1.0)
Monocytes Relative: 7.6 % (ref 3.0–12.0)
NEUTROS PCT: 70.9 % (ref 43.0–77.0)
Neutro Abs: 4.7 10*3/uL (ref 1.4–7.7)
Platelets: 226 10*3/uL (ref 150.0–400.0)
RBC: 5.26 Mil/uL (ref 4.22–5.81)
RDW: 13.8 % (ref 11.5–14.6)
WBC: 6.6 10*3/uL (ref 4.5–10.5)

## 2013-08-24 LAB — BASIC METABOLIC PANEL
BUN: 12 mg/dL (ref 6–23)
CALCIUM: 9.3 mg/dL (ref 8.4–10.5)
CO2: 28 mEq/L (ref 19–32)
CREATININE: 1 mg/dL (ref 0.4–1.5)
Chloride: 102 mEq/L (ref 96–112)
GFR: 78.15 mL/min (ref >60.00)
Glucose, Bld: 95 mg/dL (ref 70–99)
Potassium: 3.9 mEq/L (ref 3.5–5.1)
Sodium: 139 mEq/L (ref 135–145)

## 2013-08-24 LAB — HEPATIC FUNCTION PANEL
ALBUMIN: 4.1 g/dL (ref 3.5–5.2)
ALK PHOS: 76 U/L (ref 39–117)
ALT: 27 U/L (ref 0–53)
AST: 23 U/L (ref 0–37)
BILIRUBIN DIRECT: 0 mg/dL (ref 0.0–0.3)
TOTAL PROTEIN: 6.8 g/dL (ref 6.0–8.3)
Total Bilirubin: 0.7 mg/dL (ref 0.3–1.2)

## 2013-08-24 LAB — POCT URINALYSIS DIPSTICK
BILIRUBIN UA: NEGATIVE
GLUCOSE UA: NEGATIVE
Ketones, UA: NEGATIVE
LEUKOCYTES UA: NEGATIVE
NITRITE UA: NEGATIVE
Protein, UA: NEGATIVE
RBC UA: NEGATIVE
Spec Grav, UA: <=1.005
UROBILINOGEN UA: 0.2
pH, UA: 7

## 2013-08-24 LAB — LIPID PANEL
CHOL/HDL RATIO: 4
Cholesterol: 154 mg/dL (ref 0–200)
HDL: 41.4 mg/dL (ref >39.00)
LDL Cholesterol: 93 mg/dL (ref 0–99)
Triglycerides: 96 mg/dL (ref 0.0–149.0)
VLDL: 19.2 mg/dL (ref 0.0–40.0)

## 2013-08-24 LAB — TSH: TSH: 1.61 u[IU]/mL (ref 0.35–5.50)

## 2013-08-24 MED ORDER — BUDESONIDE-FORMOTEROL FUMARATE 160-4.5 MCG/ACT IN AERO: 2.0000 | INHALATION_SPRAY | Freq: Two times a day (BID) | RESPIRATORY_TRACT | Status: DC

## 2013-08-24 MED ORDER — ESCITALOPRAM OXALATE 20 MG PO TABS: 20.0000 mg | ORAL_TABLET | Freq: Every day | ORAL | Status: DC

## 2013-08-24 NOTE — Assessment & Plan Note (Signed)
lexapro resumed

## 2013-08-24 NOTE — Patient Instructions (Signed)
Preventive Care for Adults, Male A healthy lifestyle and preventive care can promote health and wellness. Preventive health guidelines for men include the following key practices:  A routine yearly physical is a good way to check with your health care provider about your health and preventative screening. It is a chance to share any concerns and updates on your health and to receive a thorough exam.  Visit your dentist for a routine exam and preventative care every 6 months. Brush your teeth twice a day and floss once a day. Good oral hygiene prevents tooth decay and gum disease.  The frequency of eye exams is based on your age, health, family medical history, use of contact lenses, and other factors. Follow your health care provider's recommendations for frequency of eye exams.  Eat a healthy diet. Foods such as vegetables, fruits, whole grains, low-fat dairy products, and lean protein foods contain the nutrients you need without too many calories. Decrease your intake of foods high in solid fats, added sugars, and salt. Eat the right amount of calories for you.Get information about a proper diet from your health care provider, if necessary.  Regular physical exercise is one of the most important things you can do for your health. Most adults should get at least 150 minutes of moderate-intensity exercise (any activity that increases your heart rate and causes you to sweat) each week. In addition, most adults need muscle-strengthening exercises on 2 or more days a week.  Maintain a healthy weight. The body mass index (BMI) is a screening tool to identify possible weight problems. It provides an estimate of body fat based on height and weight. Your health care provider can find your BMI and can help you achieve or maintain a healthy weight.For adults 20 years and older:  A BMI below 18.5 is considered underweight.  A BMI of 18.5 to 24.9 is normal.  A BMI of 25 to 29.9 is considered  overweight.  A BMI of 30 and above is considered obese.  Maintain normal blood lipids and cholesterol levels by exercising and minimizing your intake of saturated fat. Eat a balanced diet with plenty of fruit and vegetables. Blood tests for lipids and cholesterol should begin at age 42 and be repeated every 5 years. If your lipid or cholesterol levels are high, you are over 50, or you are at high risk for heart disease, you may need your cholesterol levels checked more frequently.Ongoing high lipid and cholesterol levels should be treated with medicines if diet and exercise are not working.  If you smoke, find out from your health care provider how to quit. If you do not use tobacco, do not start.  Lung cancer screening is recommended for adults aged 24 80 years who are at high risk for developing lung cancer because of a history of smoking. A yearly low-dose CT scan of the lungs is recommended for people who have at least a 30-pack-year history of smoking and are a current smoker or have quit within the past 15 years. A pack year of smoking is smoking an average of 1 pack of cigarettes a day for 1 year (for example: 1 pack a day for 30 years or 2 packs a day for 15 years). Yearly screening should continue until the smoker has stopped smoking for at least 15 years. Yearly screening should be stopped for people who develop a health problem that would prevent them from having lung cancer treatment.  If you choose to drink alcohol, do not have  more than 2 drinks per day. One drink is considered to be 12 ounces (355 mL) of beer, 5 ounces (148 mL) of wine, or 1.5 ounces (44 mL) of liquor.  Avoid use of street drugs. Do not share needles with anyone. Ask for help if you need support or instructions about stopping the use of drugs.  High blood pressure causes heart disease and increases the risk of stroke. Your blood pressure should be checked at least every 1 2 years. Ongoing high blood pressure should be  treated with medicines, if weight loss and exercise are not effective.  If you are 75 68 years old, ask your health care provider if you should take aspirin to prevent heart disease.  Diabetes screening involves taking a blood sample to check your fasting blood sugar level. This should be done once every 3 years, after age 19, if you are within normal weight and without risk factors for diabetes. Testing should be considered at a younger age or be carried out more frequently if you are overweight and have at least 1 risk factor for diabetes.  Colorectal cancer can be detected and often prevented. Most routine colorectal cancer screening begins at the age of 47 and continues through age 80. However, your health care provider may recommend screening at an earlier age if you have risk factors for colon cancer. On a yearly basis, your health care provider may provide home test kits to check for hidden blood in the stool. Use of a small camera at the end of a tube to directly examine the colon (sigmoidoscopy or colonoscopy) can detect the earliest forms of colorectal cancer. Talk to your health care provider about this at age 66, when routine screening begins. Direct exam of the colon should be repeated every 5 10 years through age 19, unless early forms of precancerous polyps or small growths are found.  People who are at an increased risk for hepatitis B should be screened for this virus. You are considered at high risk for hepatitis B if:  You were born in a country where hepatitis B occurs often. Talk with your health care provider about which countries are considered high-risk.  Your parents were born in a high-risk country and you have not received a shot to protect against hepatitis B (hepatitis B vaccine).  You have HIV or AIDS.  You use needles to inject street drugs.  You live with, or have sex with, someone who has hepatitis B.  You are a man who has sex with other men (MSM).  You get  hemodialysis treatment.  You take certain medicines for conditions such as cancer, organ transplantation, and autoimmune conditions.  Hepatitis C blood testing is recommended for all people born from [birth year] through 1965 and any individual with known risks for hepatitis C.  Practice safe sex. Use condoms and avoid high-risk sexual practices to reduce the spread of sexually transmitted infections (STIs). STIs include gonorrhea, chlamydia, syphilis, trichomonas, herpes, HPV, and human immunodeficiency virus (HIV). Herpes, HIV, and HPV are viral illnesses that have no cure. They can result in disability, cancer, and death.  A one-time screening for abdominal aortic aneurysm (AAA) and surgical repair of large AAAs by ultrasound are recommended for men ages 94 to 74 years who are current or former smokers.  Healthy men should no longer receive prostate-specific antigen (PSA) blood tests as part of routine cancer screening. Talk with your health care provider about prostate cancer screening.  Testicular cancer screening is not recommended  for adult males who have no symptoms. Screening includes self-exam, a health care provider exam, and other screening tests. Consult with your health care provider about any symptoms you have or any concerns you have about testicular cancer.  Use sunscreen. Apply sunscreen liberally and repeatedly throughout the day. You should seek shade when your shadow is shorter than you. Protect yourself by wearing long sleeves, pants, a wide-brimmed hat, and sunglasses year round, whenever you are outdoors.  Once a month, do a whole-body skin exam, using a mirror to look at the skin on your back. Tell your health care provider about new moles, moles that have irregular borders, moles that are larger than a pencil eraser, or moles that have changed in shape or color.  Stay current with required vaccines (immunizations).  Influenza vaccine. All adults should be immunized every  year.  Tetanus, diphtheria, and acellular pertussis (Td, Tdap) vaccine. An adult who has not previously received Tdap or who does not know his vaccine status should receive 1 dose of Tdap. This initial dose should be followed by tetanus and diphtheria toxoids (Td) booster doses every 10 years. Adults with an unknown or incomplete history of completing a 3-dose immunization series with Td-containing vaccines should begin or complete a primary immunization series including a Tdap dose. Adults should receive a Td booster every 10 years.  Varicella vaccine. An adult without evidence of immunity to varicella should receive 2 doses or a second dose if he has previously received 1 dose.  Human papillomavirus (HPV) vaccine. Males aged 44 21 years who have not received the vaccine previously should receive the 3-dose series. Males aged 43 26 years may be immunized. Immunization is recommended through the age of 50 years for any male who has sex with males and did not get any or all doses earlier. Immunization is recommended for any person with an immunocompromised condition through the age of 23 years if he did not get any or all doses earlier. During the 3-dose series, the second dose should be obtained 4 8 weeks after the first dose. The third dose should be obtained 24 weeks after the first dose and 16 weeks after the second dose.  Zoster vaccine. One dose is recommended for adults aged 96 years or older unless certain conditions are present.  Measles, mumps, and rubella (MMR) vaccine. Adults born before 55 generally are considered immune to measles and mumps. Adults born in [birth year] or later should have 1 or more doses of MMR vaccine unless there is a contraindication to the vaccine or there is laboratory evidence of immunity to each of the three diseases. A routine second dose of MMR vaccine should be obtained at least 28 days after the first dose for students attending postsecondary schools, health care  workers, or international travelers. People who received inactivated measles vaccine or an unknown type of measles vaccine during 1963 1967 should receive 2 doses of MMR vaccine. People who received inactivated mumps vaccine or an unknown type of mumps vaccine before 1979 and are at high risk for mumps infection should consider immunization with 2 doses of MMR vaccine. Unvaccinated health care workers born before [birth year] who lack laboratory evidence of measles, mumps, or rubella immunity or laboratory confirmation of disease should consider measles and mumps immunization with 2 doses of MMR vaccine or rubella immunization with 1 dose of MMR vaccine.  Pneumococcal 13-valent conjugate (PCV13) vaccine. When indicated, a person who is uncertain of his immunization history and has no record of immunization  should receive the PCV13 vaccine. An adult aged 67 years or older who has certain medical conditions and has not been previously immunized should receive 1 dose of PCV13 vaccine. This PCV13 should be followed with a dose of pneumococcal polysaccharide (PPSV23) vaccine. The PPSV23 vaccine dose should be obtained at least 8 weeks after the dose of PCV13 vaccine. An adult aged 79 years or older who has certain medical conditions and previously received 1 or more doses of PPSV23 vaccine should receive 1 dose of PCV13. The PCV13 vaccine dose should be obtained 1 or more years after the last PPSV23 vaccine dose.  Pneumococcal polysaccharide (PPSV23) vaccine. When PCV13 is also indicated, PCV13 should be obtained first. All adults aged 74 years and older should be immunized. An adult younger than age 50 years who has certain medical conditions should be immunized. Any person who resides in a nursing home or long-term care facility should be immunized. An adult smoker should be immunized. People with an immunocompromised condition and certain other conditions should receive both PCV13 and PPSV23 vaccines. People with human  immunodeficiency virus (HIV) infection should be immunized as soon as possible after diagnosis. Immunization during chemotherapy or radiation therapy should be avoided. Routine use of PPSV23 vaccine is not recommended for American Indians, [place] Natives, or people younger than 65 years unless there are medical conditions that require PPSV23 vaccine. When indicated, people who have unknown immunization and have no record of immunization should receive PPSV23 vaccine. One-time revaccination 5 years after the first dose of PPSV23 is recommended for people aged 41 64 years who have chronic kidney failure, nephrotic syndrome, asplenia, or immunocompromised conditions. People who received 1 2 doses of PPSV23 before age 15 years should receive another dose of PPSV23 vaccine at age 48 years or later if at least 5 years have passed since the previous dose. Doses of PPSV23 are not needed for people immunized with PPSV23 at or after age 69 years.  Meningococcal vaccine. Adults with asplenia or persistent complement component deficiencies should receive 2 doses of quadrivalent meningococcal conjugate (MenACWY-D) vaccine. The doses should be obtained at least 2 months apart. Microbiologists working with certain meningococcal bacteria, [place] recruits, people at risk during an outbreak, and people who travel to or live in countries with a high rate of meningitis should be immunized. A first-year college student up through age 7 years who is living in a residence hall should receive a dose if he did not receive a dose on or after his 16th birthday. Adults who have certain high-risk conditions should receive one or more doses of vaccine.  Hepatitis A vaccine. Adults who wish to be protected from this disease, have certain high-risk conditions, work with hepatitis A-infected animals, work in hepatitis A research labs, or travel to or work in countries with a high rate of hepatitis A should be immunized. Adults who were  previously unvaccinated and who anticipate close contact with an international adoptee during the first 60 days after arrival in the [place] from a country with a high rate of hepatitis A should be immunized.  Hepatitis B vaccine. Adults who wish to be protected from this disease, have certain high-risk conditions, may be exposed to blood or other infectious body fluids, are household contacts or sex partners of hepatitis B positive people, are clients or workers in certain care facilities, or travel to or work in countries with a high rate of hepatitis B should be immunized.  Haemophilus influenzae type b (Hib) vaccine. A  previously unvaccinated person with asplenia or sickle cell disease or having a scheduled splenectomy should receive 1 dose of Hib vaccine. Regardless of previous immunization, a recipient of a hematopoietic stem cell transplant should receive a 3-dose series 6 12 months after his successful transplant. Hib vaccine is not recommended for adults with HIV infection. Preventive Service / Frequency Ages 62 to 3  Blood pressure check.** / Every 1 to 2 years.  Lipid and cholesterol check.** / Every 5 years beginning at age 43.  Hepatitis C blood test.** / For any individual with known risks for hepatitis C.  Skin self-exam. / Monthly.  Influenza vaccine. / Every year.  Tetanus, diphtheria, and acellular pertussis (Tdap, Td) vaccine.** / Consult your health care provider. 1 dose of Td every 10 years.  Varicella vaccine.** / Consult your health care provider.  HPV vaccine. / 3 doses over 6 months, if 48 or younger.  Measles, mumps, rubella (MMR) vaccine.** / You need at least 1 dose of MMR if you were born in 1957 or later. You may also need a second dose.  Pneumococcal 13-valent conjugate (PCV13) vaccine.** / Consult your health care provider.  Pneumococcal polysaccharide (PPSV23) vaccine.** / 1 to 2 doses if you smoke cigarettes or if you have certain  conditions.  Meningococcal vaccine.** / 1 dose if you are age 8 to 70 years and a Market researcher living in a residence hall, or have one of several medical conditions. You may also need additional booster doses.  Hepatitis A vaccine.** / Consult your health care provider.  Hepatitis B vaccine.** / Consult your health care provider.  Haemophilus influenzae type b (Hib) vaccine.** / Consult your health care provider. Ages 48 to 32  Blood pressure check.** / Every 1 to 2 years.  Lipid and cholesterol check.** / Every 5 years beginning at age 38.  Lung cancer screening. / Every year if you are aged 40 80 years and have a 30-pack-year history of smoking and currently smoke or have quit within the past 15 years. Yearly screening is stopped once you have quit smoking for at least 15 years or develop a health problem that would prevent you from having lung cancer treatment.  Fecal occult blood test (FOBT) of stool. / Every year beginning at age 4 and continuing until age 70. You may not have to do this test if you get a colonoscopy every 10 years.  Flexible sigmoidoscopy** or colonoscopy.** / Every 5 years for a flexible sigmoidoscopy or every 10 years for a colonoscopy beginning at age 76 and continuing until age 62.  Hepatitis C blood test.** / For all people born from [birth year] through 1965 and any individual with known risks for hepatitis C.  Skin self-exam. / Monthly.  Influenza vaccine. / Every year.  Tetanus, diphtheria, and acellular pertussis (Tdap/Td) vaccine.** / Consult your health care provider. 1 dose of Td every 10 years.  Varicella vaccine.** / Consult your health care provider.  Zoster vaccine.** / 1 dose for adults aged 60 years or older.  Measles, mumps, rubella (MMR) vaccine.** / You need at least 1 dose of MMR if you were born in 1957 or later. You may also need a second dose.  Pneumococcal 13-valent conjugate (PCV13) vaccine.** / Consult your health care  provider.  Pneumococcal polysaccharide (PPSV23) vaccine.** / 1 to 2 doses if you smoke cigarettes or if you have certain conditions.  Meningococcal vaccine.** / Consult your health care provider.  Hepatitis A vaccine.** / Consult your health care  provider.  Hepatitis B vaccine.** / Consult your health care provider.  Haemophilus influenzae type b (Hib) vaccine.** / Consult your health care provider. Ages 65 and over  Blood pressure check.** / Every 1 to 2 years.  Lipid and cholesterol check.**/ Every 5 years beginning at age 20.  Lung cancer screening. / Every year if you are aged 55 80 years and have a 30-pack-year history of smoking and currently smoke or have quit within the past 15 years. Yearly screening is stopped once you have quit smoking for at least 15 years or develop a health problem that would prevent you from having lung cancer treatment.  Fecal occult blood test (FOBT) of stool. / Every year beginning at age 50 and continuing until age 75. You may not have to do this test if you get a colonoscopy every 10 years.  Flexible sigmoidoscopy** or colonoscopy.** / Every 5 years for a flexible sigmoidoscopy or every 10 years for a colonoscopy beginning at age 50 and continuing until age 75.  Hepatitis C blood test.** / For all people born from 1945 through 1965 and any individual with known risks for hepatitis C.  Abdominal aortic aneurysm (AAA) screening.** / A one-time screening for ages 65 to 75 years who are current or former smokers.  Skin self-exam. / Monthly.  Influenza vaccine. / Every year.  Tetanus, diphtheria, and acellular pertussis (Tdap/Td) vaccine.** / 1 dose of Td every 10 years.  Varicella vaccine.** / Consult your health care provider.  Zoster vaccine.** / 1 dose for adults aged 60 years or older.  Pneumococcal 13-valent conjugate (PCV13) vaccine.** / Consult your health care provider.  Pneumococcal polysaccharide (PPSV23) vaccine.** / 1 dose for all  adults aged 65 years and older.  Meningococcal vaccine.** / Consult your health care provider.  Hepatitis A vaccine.** / Consult your health care provider.  Hepatitis B vaccine.** / Consult your health care provider.  Haemophilus influenzae type b (Hib) vaccine.** / Consult your health care provider. **Family history and personal history of risk and conditions may change your health care provider's recommendations. Document Released: 08/03/2001 Document Revised: 03/28/2013 Document Reviewed: 11/02/2010 [employer] Patient Information 2014 [employer].  

## 2013-08-24 NOTE — Progress Notes (Signed)
Subjective:    Russell Brown is a 68 y.o. male who presents for Medicare Annual/Subsequent preventive examination.   Preventive Screening-Counseling & Management  Tobacco History  Smoking status  . Former Smoker -- 1.00 packs/day for 35 years  . Quit date: 08/18/1996  Smokeless tobacco  . Never Used    Problems Prior to Visit 1. none  Current Problems (verified) Patient Active Problem List   Diagnosis Date Noted  . Memory loss 06/20/2012  . Head injury, closed, with brief LOC 06/20/2012  . Pneumonia 03/03/2012  . Tinnitus 03/03/2012  . BENIGN PROSTATIC HYPERTROPHY 05/22/2009  . ASTHMA 05/20/2008  . CHRONIC OBSTRUCTIVE PULMONARY DISEASE, ACUTE EXACERBATION 03/26/2008  . ELEVATED PROSTATE SPECIFIC ANTIGEN 10/28/2006  . HYPOTHYROIDISM 10/26/2006  . HYPERLIPIDEMIA 10/26/2006  . ERECTILE DYSFUNCTION 10/26/2006  . HYPERTENSION 10/26/2006  . GERD 10/26/2006    Medications Prior to Visit Current Outpatient Prescriptions on File Prior to Visit  Medication Sig Dispense Refill  . aspirin (ASPIRIN EC) 81 MG EC tablet Take 81 mg by mouth daily.        [name] Kitchen atorvastatin (LIPITOR) 10 MG tablet Take 1 tablet by mouth  daily  90 tablet  0  . Calcium Carbonate-Vitamin D (CALCIUM 600/VITAMIN D) 600-400 MG-UNIT per chew tablet Chew 1 tablet by mouth daily.      [name] Kitchen dutasteride (AVODART) 0.5 MG capsule Take 0.5 mg by mouth daily.      . fish oil-omega-3 fatty acids 1000 MG capsule Take 3 g by mouth daily.      [name] Kitchen levothyroxine (SYNTHROID, LEVOTHROID) 75 MCG tablet 1 tab by mouth daily--Office visit due now  90 tablet  0  . lisinopril-hydrochlorothiazide (PRINZIDE,ZESTORETIC) 10-12.5 MG per tablet 1 tab by mouth daily--office visit due now  90 tablet  0  . omeprazole (PRILOSEC) 20 MG capsule Take 1 capsule by mouth  daily  90 capsule  0  . sildenafil (VIAGRA) 100 MG tablet Take 1 tablet (100 mg total) by mouth daily as needed for erectile dysfunction.  18 tablet  1  . vitamin C (ASCORBIC  ACID) 500 MG tablet Take 500 mg by mouth daily.      . vitamin E 400 UNIT capsule Take 400 Units by mouth daily.       No current facility-administered medications on file prior to visit.    Current Medications (verified) Current Outpatient Prescriptions  Medication Sig Dispense Refill  . aspirin (ASPIRIN EC) 81 MG EC tablet Take 81 mg by mouth daily.        [name] Kitchen atorvastatin (LIPITOR) 10 MG tablet Take 1 tablet by mouth  daily  90 tablet  0  . budesonide-formoterol (SYMBICORT) 160-4.5 MCG/ACT inhaler Inhale 2 puffs into the lungs 2 (two) times daily.  3 Inhaler  3  . Calcium Carbonate-Vitamin D (CALCIUM 600/VITAMIN D) 600-400 MG-UNIT per chew tablet Chew 1 tablet by mouth daily.      [name] Kitchen dutasteride (AVODART) 0.5 MG capsule Take 0.5 mg by mouth daily.      . fish oil-omega-3 fatty acids 1000 MG capsule Take 3 g by mouth daily.      [name] Kitchen levothyroxine (SYNTHROID, LEVOTHROID) 75 MCG tablet 1 tab by mouth daily--Office visit due now  90 tablet  0  . lisinopril-hydrochlorothiazide (PRINZIDE,ZESTORETIC) 10-12.5 MG per tablet 1 tab by mouth daily--office visit due now  90 tablet  0  . omeprazole (PRILOSEC) 20 MG capsule Take 1 capsule by mouth  daily  90 capsule  0  . sildenafil (VIAGRA) 100 MG  tablet Take 1 tablet (100 mg total) by mouth daily as needed for erectile dysfunction.  18 tablet  1  . vitamin C (ASCORBIC ACID) 500 MG tablet Take 500 mg by mouth daily.      . vitamin E 400 UNIT capsule Take 400 Units by mouth daily.       No current facility-administered medications for this visit.     Allergies (verified) Codeine   PAST HISTORY  Family History Family History  Problem Relation Age of Onset  . Parkinsonism Mother   . Osteoporosis Mother   . Cancer Father     prostate,  skin  . Hyperlipidemia Father   . Hypertension Father   . Asthma Son   . Osteoporosis Paternal Aunt   . Alzheimer's disease Paternal Uncle   . Osteoporosis Maternal Grandmother   . Cancer Maternal Grandmother      lung  . Heart disease Paternal Grandmother     MI  . Heart disease Paternal Grandfather     MI  . Colon cancer Neg Hx   . Pancreatic cancer Neg Hx   . Stomach cancer Neg Hx     Social History History  Substance Use Topics  . Smoking status: Former Smoker -- 1.00 packs/day for 35 years    Quit date: 08/18/1996  . Smokeless tobacco: Never Used  . Alcohol Use: 7.2 oz/week    12 Cans of beer per week    Are there smokers in your home (other than you)?  No  Risk Factors Current exercise habits: Gym/ health club routine includes cardio and light weights.  Dietary issues discussed: na   Cardiac risk factors: advanced age (older than 77 for men, 40 for women), dyslipidemia, hypertension and male gender.  Depression Screen (Note: if answer to either of the following is "Yes", a more complete depression screening is indicated)   Q1: Over the past two weeks, have you felt down, depressed or hopeless? No  Q2: Over the past two weeks, have you felt little interest or pleasure in doing things? No  Have you lost interest or pleasure in daily life? No  Do you often feel hopeless? No  Do you cry easily over simple problems? No  Activities of Daily Living In your present state of health, do you have any difficulty performing the following activities?:  Driving? No Managing money?  No Feeding yourself? No Getting from bed to chair? No Climbing a flight of stairs? No Preparing food and eating?: No Bathing or showering? No Getting dressed: No Getting to the toilet? No Using the toilet:No Moving around from place to place: No In the past year have you fallen or had a near fall?:No   Are you sexually active?  Yes  Do you have more than one partner?  No  Hearing Difficulties: No Do you often ask people to speak up or repeat themselves? No Do you experience ringing or noises in your ears? No Do you have difficulty understanding soft or whispered voices? No   Do you feel that you  have a problem with memory? No  Do you often misplace items? No  Do you feel safe at home?  Yes  Cognitive Testing  Alert? Yes  Normal Appearance?Yes  Oriented to person? Yes  Place? Yes   Time? Yes  Recall of three objects?  Yes  Can perform simple calculations? Yes  Displays appropriate judgment?Yes  Can read the correct time from a watch face?Yes   Advanced Directives have been discussed  with the patient? Yes   List the Names of Other Physician/Practitioners you currently use: 1.    Indicate any recent Medical Services you may have received from other than [facility] providers in the past year (date may be approximate).  Immunization History  Administered Date(s) Administered  . H1N1 05/28/2008  . Influenza Whole 04/28/2007, 03/26/2008, 04/16/2009, 05/07/2010, 04/10/2011, 04/13/2012  . Influenza, Seasonal, Injecte, Preservative Fre 03/06/2013  . Influenza-Unspecified 03/22/2011  . Pneumococcal Polysaccharide-23 04/11/2008, 04/18/2013  . Td 05/22/2009  . Zoster 05/22/2009    Screening Tests Health Maintenance  Topic Date Due  . Influenza Vaccine  01/19/2014  . Tetanus/tdap  05/23/2019  . Colonoscopy  05/11/2023  . Pneumococcal Polysaccharide Vaccine Age 28 And Over  Completed  . Zostavax  Completed    All answers were reviewed with the patient and necessary referrals were made:  Garnet Koyanagi, DO   08/24/2013   History reviewed:  He  has a past medical history of Hypertension; Thyroid disease; Hyperlipemia; GERD (gastroesophageal reflux disease); and Asthma. He  does not have any pertinent problems on file. He  has past surgical history that includes Stomach surgery; Hernia repair; Appendectomy; Abdominal surgery; Back surgery; Foot Amputation; Hand amputation; Thyroidectomy; Nissen fundoplication; Septoplasty; and Cervical spine surgery. His family history includes Alzheimer's disease in his paternal uncle; Asthma in his son; Cancer in his father and maternal grandmother;  Heart disease in his paternal grandfather and paternal grandmother; Hyperlipidemia in his father; Hypertension in his father; Osteoporosis in his maternal grandmother, mother, and paternal aunt; Parkinsonism in his mother. There is no history of Colon cancer, Pancreatic cancer, or Stomach cancer. He  reports that he quit smoking about 17 years ago. He has never used smokeless tobacco. He reports that he drinks about 7.2 ounces of alcohol per week. He reports that he does not use illicit drugs. He has a current medication list which includes the following prescription(s): aspirin, atorvastatin, budesonide-formoterol, calcium carbonate-vitamin d, dutasteride, fish oil-omega-3 fatty acids, levothyroxine, lisinopril-hydrochlorothiazide, omeprazole, sildenafil, vitamin c, vitamin e, and escitalopram. Current Outpatient Prescriptions on File Prior to Visit  Medication Sig Dispense Refill  . aspirin (ASPIRIN EC) 81 MG EC tablet Take 81 mg by mouth daily.        [name] Kitchen atorvastatin (LIPITOR) 10 MG tablet Take 1 tablet by mouth  daily  90 tablet  0  . Calcium Carbonate-Vitamin D (CALCIUM 600/VITAMIN D) 600-400 MG-UNIT per chew tablet Chew 1 tablet by mouth daily.      [name] Kitchen dutasteride (AVODART) 0.5 MG capsule Take 0.5 mg by mouth daily.      . fish oil-omega-3 fatty acids 1000 MG capsule Take 3 g by mouth daily.      [name] Kitchen levothyroxine (SYNTHROID, LEVOTHROID) 75 MCG tablet 1 tab by mouth daily--Office visit due now  90 tablet  0  . lisinopril-hydrochlorothiazide (PRINZIDE,ZESTORETIC) 10-12.5 MG per tablet 1 tab by mouth daily--office visit due now  90 tablet  0  . omeprazole (PRILOSEC) 20 MG capsule Take 1 capsule by mouth  daily  90 capsule  0  . sildenafil (VIAGRA) 100 MG tablet Take 1 tablet (100 mg total) by mouth daily as needed for erectile dysfunction.  18 tablet  1  . vitamin C (ASCORBIC ACID) 500 MG tablet Take 500 mg by mouth daily.      . vitamin E 400 UNIT capsule Take 400 Units by mouth daily.       No  current facility-administered medications on file prior to visit.   He is  allergic to codeine.  Review of Systems  Review of Systems  Constitutional: Negative for activity change, appetite change and fatigue.  HENT: Negative for hearing loss, congestion, tinnitus and ear discharge.   Eyes: Negative for visual disturbance (see optho q1y -- vision corrected to 20/20 with glasses).  Respiratory: Negative for cough, chest tightness and shortness of breath.   Cardiovascular: Negative for chest pain, palpitations and leg swelling.  Gastrointestinal: Negative for abdominal pain, diarrhea, constipation and abdominal distention.  Genitourinary: Negative for urgency, frequency, decreased urine volume and difficulty urinating.  Musculoskeletal: Negative for back pain, arthralgias and gait problem.  Skin: Negative for color change, pallor and rash.  Neurological: Negative for dizziness, light-headedness, numbness and headaches.  Hematological: Negative for adenopathy. Does not bruise/bleed easily.  Psychiatric/Behavioral: Negative for suicidal ideas, confusion, sleep disturbance, self-injury, dysphoric mood, decreased concentration and agitation.  Pt is able to read and write and can do all ADLs No risk for falling No abuse/ violence in home      Objective:     Vision by Snellen chart: opth Blood pressure 122/74, pulse 73, temperature 98.2 F (36.8 C), temperature source Oral, height 6' 1.5" (1.867 m), weight 188 lb 9.6 oz (85.548 kg), SpO2 95.00%. Body mass index is 24.54 kg/(m^2).  BP 122/74  Pulse 73  Temp(Src) 98.2 F (36.8 C) (Oral)  Ht 6' 1.5" (1.867 m)  Wt 188 lb 9.6 oz (85.548 kg)  BMI 24.54 kg/m2  SpO2 95% General appearance: alert, cooperative, appears stated age and no distress Head: Normocephalic, without obvious abnormality, atraumatic Eyes: conjunctivae/corneas clear. PERRL, EOM's intact. Fundi benign. Ears: normal TM's and external ear canals both ears Nose: green  discharge, moderate congestion, turbinates red, swollen, sinus tenderness bilateral Throat: lips, mucosa, and tongue normal; teeth and gums normal Neck: marked anterior cervical adenopathy, supple, symmetrical, trachea midline and thyroid not enlarged, symmetric, no tenderness/mass/nodules Back: symmetric, no curvature. ROM normal. No CVA tenderness. Lungs: clear to auscultation bilaterally Chest wall: no tenderness Heart: S1, S2 normal Abdomen: soft, non-tender; bowel sounds normal; no masses,  no organomegaly Male genitalia: deferred-urology Rectal: deferred--urology Extremities: extremities normal, atraumatic, no cyanosis or edema Pulses: 2+ and symmetric Skin: Skin color, texture, turgor normal. No rashes or lesions Lymph nodes: Cervical, supraclavicular, and axillary nodes normal. Neurologic: Alert and oriented X 3, normal strength and tone. Normal symmetric reflexes. Normal coordination and gait Psych-- pt has been feeling a little overwhelmed lately with family illness.  He has been seeing neuropsych and was put on lexapro     Assessment:     cpe     Plan:     During the course of the visit the patient was educated and counseled about appropriate screening and preventive services including:    Pneumococcal vaccine   Prostate cancer screening  Colorectal cancer screening  Diabetes screening  Glaucoma screening  Advanced directives: has an advanced directive - a copy HAS NOT been provided.  Diet review for nutrition referral? Yes ____  Not Indicated __x__   Patient Instructions (the written plan) was given to the patient.  Medicare Attestation I have personally reviewed: The patient's medical and social history Their use of alcohol, tobacco or illicit drugs Their current medications and supplements The patient's functional ability including ADLs,fall risks, home safety risks, cognitive, and hearing and visual impairment Diet and physical activities Evidence  for depression or mood disorders  The patient's weight, height, BMI, and visual acuity have been recorded in the chart.  I have made referrals, counseling,  and provided education to the patient based on review of the above and I have provided the patient with a written personalized care plan for preventive services.    1. Unspecified asthma(493.90) stable - budesonide-formoterol (SYMBICORT) 160-4.5 MCG/ACT inhaler; Inhale 2 puffs into the lungs 2 (two) times daily.  Dispense: 3 Inhaler; Refill: 3  2. Unspecified hypothyroidism Check labs - TSH  3. HTN (hypertension) stable - Basic metabolic panel - CBC with Differential - POCT urinalysis dipstick  4. Other and unspecified hyperlipidemia Check labs - Hepatic function panel - Lipid panel - POCT urinalysis dipstick  5. Preventative health care   6. Generalized anxiety disorder  - escitalopram (LEXAPRO) 20 MG tablet; Take 1 tablet (20 mg total) by mouth daily.  Dispense: 30 tablet   Garnet Koyanagi, DO   08/24/2013

## 2013-08-24 NOTE — Progress Notes (Signed)
Pre visit review using our clinic review tool, if applicable. No additional management support is needed unless otherwise documented below in the visit note. 

## 2013-08-25 ENCOUNTER — Telehealth: Payer: Self-pay | Admitting: Family Medicine

## 2013-08-25 NOTE — Telephone Encounter (Signed)
Relevant patient education assigned to patient using Emmi. ° °

## 2013-08-27 ENCOUNTER — Telehealth: Payer: Self-pay | Admitting: Family Medicine

## 2013-08-27 NOTE — Telephone Encounter (Signed)
Relevant patient education assigned to patient using Emmi. ° °

## 2013-09-16 ENCOUNTER — Other Ambulatory Visit: Payer: Self-pay | Admitting: Family Medicine

## 2013-10-09 ENCOUNTER — Encounter: Payer: Self-pay | Admitting: Family Medicine

## 2013-10-09 DIAGNOSIS — F411 Generalized anxiety disorder: Secondary | ICD-10-CM

## 2013-10-09 MED ORDER — ESCITALOPRAM OXALATE 20 MG PO TABS: 20.0000 mg | ORAL_TABLET | Freq: Every day | ORAL | Status: DC

## 2013-10-09 NOTE — Telephone Encounter (Signed)
-----   Message from Rosalita Chessman, DO sent at 10/09/2013 10:30 AM    20 mg #30 1 po qd , 2 refills

## 2013-10-09 NOTE — Telephone Encounter (Signed)
Russell Brown: AT MY ANNUAL "WELLNESS VISIT" ON MARCH 6, DR LOWNE PRESCRIBED ESCITALOPRAM 10MG  FOR MILD ANXIETY - I ALSO STARTED SEEING A COUNSELOR (Bertram) TO DISCOVER THE CAUSE OF MY ANXIETY - SHE HAS DIAGNOSED ME WITH PTSD BUT THE CURRENT DOSAGE IS NOT WORKING - PLEASE ADVISE IF DR LOWNE CAN INCREASE THE DOSAGE TO 20MG  AND FAX A PRESCRIPTION TO [facility] - MY BEST REGARDS, [name]    Please ad vise on if increasing his dose is appropriate. Thanks      K

## 2014-02-26 ENCOUNTER — Ambulatory Visit (INDEPENDENT_AMBULATORY_CARE_PROVIDER_SITE_OTHER): Payer: 59 | Admitting: Family Medicine

## 2014-02-26 ENCOUNTER — Encounter: Payer: Self-pay | Admitting: Family Medicine

## 2014-02-26 VITALS — BP 130/88 | HR 70 | Temp 99.1°F | Ht 72.5 in | Wt 186.4 lb

## 2014-02-26 DIAGNOSIS — F4310 Post-traumatic stress disorder, unspecified: Secondary | ICD-10-CM

## 2014-02-26 MED ORDER — BUPROPION HCL ER (SR) 100 MG PO TB12: 100.0000 mg | ORAL_TABLET | Freq: Two times a day (BID) | ORAL | Status: DC

## 2014-02-26 NOTE — Patient Instructions (Signed)
Post-traumatic Stress °You have post-traumatic stress disorder (PTSD). This condition causes many different symptoms including: emotional outbursts, anxiety, sleeping problems, social withdrawal, and drug abuse. PTSD often follows a particularly traumatic event such as war, or natural disasters like hurricanes, earthquakes, or floods. It can also be seen after personal traumas such as accidents, rape, or the death of someone you love. Symptoms may be delayed for days or even years. Emotional numbing and the inability to feel your emotions, may be the earliest sign. Periods of agitation, aggression, and inability to perform ordinary tasks are common with PTSD. °Nightmares and daytime memories of the trauma often bring on uncontrolled symptoms. Sufferers typically startle easily and avoid reminders of the trauma. Panic attacks, feelings of extreme guilt, and blackouts are often reported. Treatment is very helpful, especially group therapy. Healing happens when emotional traumas are shared with others who have a sympathetic ear. The [facility] have helped over 185,000 veterans with this problem. Medication is also very effective. The symptoms can become chronic and lifelong, so it is important to get help. Call your caregiver or a counselor who deals with this type of problem for further assistance. °Document Released: 07/15/2004 Document Revised: 08/30/2011 Document Reviewed: 06/07/2005 °Exit[employer]® Patient Information ©2015 [employer]. This information is not intended to replace advice given to you by your health care provider. Make sure you discuss any questions you have with your health care provider. ° °

## 2014-02-26 NOTE — Progress Notes (Signed)
   Subjective:    Patient ID: Russell Brown, male    DOB: 1945-11-18, 68 y.o.   MRN: [id number]  HPI Pt here to discuss Lexapro.  He c/o decreased libido and being unable to climax during intercourse.  He would like to change medication.  He has already cut lexapro down to 10 mg.    Review of Systems As above    Objective:   Physical Exam  BP 130/88  Pulse 70  Temp(Src) 99.1 F (37.3 C) (Oral)  Ht 6' 0.5" (1.842 m)  Wt 186 lb 6.4 oz (84.55 kg)  BMI 24.92 kg/m2  SpO2 96% General appearance: alert, cooperative, appears stated age and no distress Neurologic: Alert and oriented X 3, normal strength and tone. Normal symmetric reflexes. Normal coordination and gait Mental status: Alert, oriented, thought content appropriate       Assessment & Plan:  1. PTSD (post-traumatic stress disorder) con't lexapro and con't counselor - buPROPion (WELLBUTRIN SR) 100 MG 12 hr tablet; Take 1 tablet (100 mg total) by mouth 2 (two) times daily.  Dispense: 60 tablet; Refill: 2 rto 4 weeks

## 2014-03-24 ENCOUNTER — Other Ambulatory Visit: Payer: Self-pay | Admitting: Family Medicine

## 2014-05-01 ENCOUNTER — Encounter: Payer: Self-pay | Admitting: Family Medicine

## 2014-05-01 NOTE — Telephone Encounter (Signed)
Actually most people need 300 mg but ----  Ok to change Wellbutrin xl 150 mg 1 po qam #30  2 refills

## 2014-05-02 MED ORDER — BUPROPION HCL ER (XL) 150 MG PO TB24: 150.0000 mg | ORAL_TABLET | Freq: Every day | ORAL | Status: DC

## 2014-06-03 ENCOUNTER — Other Ambulatory Visit: Payer: Self-pay | Admitting: Family Medicine

## 2014-06-05 ENCOUNTER — Other Ambulatory Visit: Payer: Self-pay | Admitting: Family Medicine

## 2014-08-04 ENCOUNTER — Other Ambulatory Visit: Payer: Self-pay | Admitting: Family Medicine

## 2014-08-10 ENCOUNTER — Other Ambulatory Visit: Payer: Self-pay | Admitting: Family Medicine

## 2014-09-19 ENCOUNTER — Other Ambulatory Visit: Payer: Self-pay | Admitting: Family Medicine

## 2014-11-28 ENCOUNTER — Encounter: Payer: Self-pay | Admitting: Family Medicine

## 2014-12-04 ENCOUNTER — Telehealth: Payer: Self-pay | Admitting: *Deleted

## 2014-12-04 NOTE — Telephone Encounter (Signed)
Unable to reach patient at time of Pre-Visit Call.  Left message for patient to return call when available.    

## 2014-12-05 ENCOUNTER — Encounter: Payer: Self-pay | Admitting: Family Medicine

## 2014-12-05 ENCOUNTER — Ambulatory Visit (INDEPENDENT_AMBULATORY_CARE_PROVIDER_SITE_OTHER): Payer: Medicare Other | Admitting: Family Medicine

## 2014-12-05 VITALS — BP 128/82 | HR 66 | Temp 99.0°F | Ht 73.0 in | Wt 185.6 lb

## 2014-12-05 DIAGNOSIS — E785 Hyperlipidemia, unspecified: Secondary | ICD-10-CM | POA: Diagnosis not present

## 2014-12-05 DIAGNOSIS — I10 Essential (primary) hypertension: Secondary | ICD-10-CM | POA: Diagnosis not present

## 2014-12-05 DIAGNOSIS — Z23 Encounter for immunization: Secondary | ICD-10-CM | POA: Diagnosis not present

## 2014-12-05 DIAGNOSIS — E039 Hypothyroidism, unspecified: Secondary | ICD-10-CM | POA: Diagnosis not present

## 2014-12-05 DIAGNOSIS — Z0000 Encounter for general adult medical examination without abnormal findings: Secondary | ICD-10-CM

## 2014-12-05 LAB — BASIC METABOLIC PANEL
BUN: 11 mg/dL (ref 6–23)
CALCIUM: 9.4 mg/dL (ref 8.4–10.5)
CO2: 29 meq/L (ref 19–32)
Chloride: 101 mEq/L (ref 96–112)
Creatinine, Ser: 0.98 mg/dL (ref 0.40–1.50)
GFR: 80.61 mL/min (ref >60.00)
Glucose, Bld: 94 mg/dL (ref 70–99)
Potassium: 3.9 mEq/L (ref 3.5–5.1)
Sodium: 136 mEq/L (ref 135–145)

## 2014-12-05 LAB — LIPID PANEL
CHOL/HDL RATIO: 3
Cholesterol: 147 mg/dL (ref 0–200)
HDL: 47.8 mg/dL (ref >39.00)
LDL CALC: 71 mg/dL (ref 0–99)
NonHDL: 99.2
Triglycerides: 143 mg/dL (ref 0.0–149.0)
VLDL: 28.6 mg/dL (ref 0.0–40.0)

## 2014-12-05 LAB — MICROALBUMIN / CREATININE URINE RATIO
Creatinine,U: 40.7 mg/dL
Microalb Creat Ratio: 1.7 mg/g (ref 0.0–30.0)
Microalb, Ur: <0.7 mg/dL (ref 0.0–1.9)

## 2014-12-05 LAB — HEPATIC FUNCTION PANEL
ALBUMIN: 4.4 g/dL (ref 3.5–5.2)
ALT: 22 U/L (ref 0–53)
AST: 19 U/L (ref 0–37)
Alkaline Phosphatase: 82 U/L (ref 39–117)
BILIRUBIN DIRECT: 0.1 mg/dL (ref 0.0–0.3)
Total Bilirubin: 0.6 mg/dL (ref 0.2–1.2)
Total Protein: 6.8 g/dL (ref 6.0–8.3)

## 2014-12-05 LAB — TSH: TSH: 1.61 u[IU]/mL (ref 0.35–4.50)

## 2014-12-05 NOTE — Patient Instructions (Signed)
Preventive Care for Adults A healthy lifestyle and preventive care can promote health and wellness. Preventive health guidelines for men include the following key practices:  A routine yearly physical is a good way to check with your health care provider about your health and preventative screening. It is a chance to share any concerns and updates on your health and to receive a thorough exam.  Visit your dentist for a routine exam and preventative care every 6 months. Brush your teeth twice a day and floss once a day. Good oral hygiene prevents tooth decay and gum disease.  The frequency of eye exams is based on your age, health, family medical history, use of contact lenses, and other factors. Follow your health care provider's recommendations for frequency of eye exams.  Eat a healthy diet. Foods such as vegetables, fruits, whole grains, low-fat dairy products, and lean protein foods contain the nutrients you need without too many calories. Decrease your intake of foods high in solid fats, added sugars, and salt. Eat the right amount of calories for you.Get information about a proper diet from your health care provider, if necessary.  Regular physical exercise is one of the most important things you can do for your health. Most adults should get at least 150 minutes of moderate-intensity exercise (any activity that increases your heart rate and causes you to sweat) each week. In addition, most adults need muscle-strengthening exercises on 2 or more days a week.  Maintain a healthy weight. The body mass index (BMI) is a screening tool to identify possible weight problems. It provides an estimate of body fat based on height and weight. Your health care provider can find your BMI and can help you achieve or maintain a healthy weight.For adults 20 years and older:  A BMI below 18.5 is considered underweight.  A BMI of 18.5 to 24.9 is normal.  A BMI of 25 to 29.9 is considered overweight.  A BMI  of 30 and above is considered obese.  Maintain normal blood lipids and cholesterol levels by exercising and minimizing your intake of saturated fat. Eat a balanced diet with plenty of fruit and vegetables. Blood tests for lipids and cholesterol should begin at age 50 and be repeated every 5 years. If your lipid or cholesterol levels are high, you are over 50, or you are at high risk for heart disease, you may need your cholesterol levels checked more frequently.Ongoing high lipid and cholesterol levels should be treated with medicines if diet and exercise are not working.  If you smoke, find out from your health care provider how to quit. If you do not use tobacco, do not start.  Lung cancer screening is recommended for adults aged 73-80 years who are at high risk for developing lung cancer because of a history of smoking. A yearly low-dose CT scan of the lungs is recommended for people who have at least a 30-pack-year history of smoking and are a current smoker or have quit within the past 15 years. A pack year of smoking is smoking an average of 1 pack of cigarettes a day for 1 year (for example: 1 pack a day for 30 years or 2 packs a day for 15 years). Yearly screening should continue until the smoker has stopped smoking for at least 15 years. Yearly screening should be stopped for people who develop a health problem that would prevent them from having lung cancer treatment.  If you choose to drink alcohol, do not have more than  2 drinks per day. One drink is considered to be 12 ounces (355 mL) of beer, 5 ounces (148 mL) of wine, or 1.5 ounces (44 mL) of liquor.  Avoid use of street drugs. Do not share needles with anyone. Ask for help if you need support or instructions about stopping the use of drugs.  High blood pressure causes heart disease and increases the risk of stroke. Your blood pressure should be checked at least every 1-2 years. Ongoing high blood pressure should be treated with  medicines, if weight loss and exercise are not effective.  If you are 45-79 years old, ask your health care provider if you should take aspirin to prevent heart disease.  Diabetes screening involves taking a blood sample to check your fasting blood sugar level. This should be done once every 3 years, after age 45, if you are within normal weight and without risk factors for diabetes. Testing should be considered at a younger age or be carried out more frequently if you are overweight and have at least 1 risk factor for diabetes.  Colorectal cancer can be detected and often prevented. Most routine colorectal cancer screening begins at the age of 50 and continues through age 75. However, your health care provider may recommend screening at an earlier age if you have risk factors for colon cancer. On a yearly basis, your health care provider may provide home test kits to check for hidden blood in the stool. Use of a small camera at the end of a tube to directly examine the colon (sigmoidoscopy or colonoscopy) can detect the earliest forms of colorectal cancer. Talk to your health care provider about this at age 50, when routine screening begins. Direct exam of the colon should be repeated every 5-10 years through age 75, unless early forms of precancerous polyps or small growths are found.  People who are at an increased risk for hepatitis B should be screened for this virus. You are considered at high risk for hepatitis B if:  You were born in a country where hepatitis B occurs often. Talk with your health care provider about which countries are considered high risk.  Your parents were born in a high-risk country and you have not received a shot to protect against hepatitis B (hepatitis B vaccine).  You have HIV or AIDS.  You use needles to inject street drugs.  You live with, or have sex with, someone who has hepatitis B.  You are a man who has sex with other men (MSM).  You get hemodialysis  treatment.  You take certain medicines for conditions such as cancer, organ transplantation, and autoimmune conditions.  Hepatitis C blood testing is recommended for all people born from 1945 through 1965 and any individual with known risks for hepatitis C.  Practice safe sex. Use condoms and avoid high-risk sexual practices to reduce the spread of sexually transmitted infections (STIs). STIs include gonorrhea, chlamydia, syphilis, trichomonas, herpes, HPV, and human immunodeficiency virus (HIV). Herpes, HIV, and HPV are viral illnesses that have no cure. They can result in disability, cancer, and death.  If you are at risk of being infected with HIV, it is recommended that you take a prescription medicine daily to prevent HIV infection. This is called preexposure prophylaxis (PrEP). You are considered at risk if:  You are a man who has sex with other men (MSM) and have other risk factors.  You are a heterosexual man, are sexually active, and are at increased risk for HIV infection.    You take drugs by injection.  You are sexually active with a partner who has HIV.  Talk with your health care provider about whether you are at high risk of being infected with HIV. If you choose to begin PrEP, you should first be tested for HIV. You should then be tested every 3 months for as long as you are taking PrEP.  A one-time screening for abdominal aortic aneurysm (AAA) and surgical repair of large AAAs by ultrasound are recommended for men ages 32 to 67 years who are current or former smokers.  Healthy men should no longer receive prostate-specific antigen (PSA) blood tests as part of routine cancer screening. Talk with your health care provider about prostate cancer screening.  Testicular cancer screening is not recommended for adult males who have no symptoms. Screening includes self-exam, a health care provider exam, and other screening tests. Consult with your health care provider about any symptoms  you have or any concerns you have about testicular cancer.  Use sunscreen. Apply sunscreen liberally and repeatedly throughout the day. You should seek shade when your shadow is shorter than you. Protect yourself by wearing long sleeves, pants, a wide-brimmed hat, and sunglasses year round, whenever you are outdoors.  Once a month, do a whole-body skin exam, using a mirror to look at the skin on your back. Tell your health care provider about new moles, moles that have irregular borders, moles that are larger than a pencil eraser, or moles that have changed in shape or color.  Stay current with required vaccines (immunizations).  Influenza vaccine. All adults should be immunized every year.  Tetanus, diphtheria, and acellular pertussis (Td, Tdap) vaccine. An adult who has not previously received Tdap or who does not know his vaccine status should receive 1 dose of Tdap. This initial dose should be followed by tetanus and diphtheria toxoids (Td) booster doses every 10 years. Adults with an unknown or incomplete history of completing a 3-dose immunization series with Td-containing vaccines should begin or complete a primary immunization series including a Tdap dose. Adults should receive a Td booster every 10 years.  Varicella vaccine. An adult without evidence of immunity to varicella should receive 2 doses or a second dose if he has previously received 1 dose.  Human papillomavirus (HPV) vaccine. Males aged 68-21 years who have not received the vaccine previously should receive the 3-dose series. Males aged 22-26 years may be immunized. Immunization is recommended through the age of 6 years for any male who has sex with males and did not get any or all doses earlier. Immunization is recommended for any person with an immunocompromised condition through the age of 49 years if he did not get any or all doses earlier. During the 3-dose series, the second dose should be obtained 4-8 weeks after the first  dose. The third dose should be obtained 24 weeks after the first dose and 16 weeks after the second dose.  Zoster vaccine. One dose is recommended for adults aged 50 years or older unless certain conditions are present.  Measles, mumps, and rubella (MMR) vaccine. Adults born before [birth year] generally are considered immune to measles and mumps. Adults born in [birth year] or later should have 1 or more doses of MMR vaccine unless there is a contraindication to the vaccine or there is laboratory evidence of immunity to each of the three diseases. A routine second dose of MMR vaccine should be obtained at least 28 days after the first dose for students attending postsecondary  schools, health care workers, or international travelers. People who received inactivated measles vaccine or an unknown type of measles vaccine during 1963-1967 should receive 2 doses of MMR vaccine. People who received inactivated mumps vaccine or an unknown type of mumps vaccine before 1979 and are at high risk for mumps infection should consider immunization with 2 doses of MMR vaccine. Unvaccinated health care workers born before 1957 who lack laboratory evidence of measles, mumps, or rubella immunity or laboratory confirmation of disease should consider measles and mumps immunization with 2 doses of MMR vaccine or rubella immunization with 1 dose of MMR vaccine.  Pneumococcal 13-valent conjugate (PCV13) vaccine. When indicated, a person who is uncertain of his immunization history and has no record of immunization should receive the PCV13 vaccine. An adult aged 19 years or older who has certain medical conditions and has not been previously immunized should receive 1 dose of PCV13 vaccine. This PCV13 should be followed with a dose of pneumococcal polysaccharide (PPSV23) vaccine. The PPSV23 vaccine dose should be obtained at least 8 weeks after the dose of PCV13 vaccine. An adult aged 19 years or older who has certain medical conditions and  previously received 1 or more doses of PPSV23 vaccine should receive 1 dose of PCV13. The PCV13 vaccine dose should be obtained 1 or more years after the last PPSV23 vaccine dose.  Pneumococcal polysaccharide (PPSV23) vaccine. When PCV13 is also indicated, PCV13 should be obtained first. All adults aged 65 years and older should be immunized. An adult younger than age 65 years who has certain medical conditions should be immunized. Any person who resides in a nursing home or long-term care facility should be immunized. An adult smoker should be immunized. People with an immunocompromised condition and certain other conditions should receive both PCV13 and PPSV23 vaccines. People with human immunodeficiency virus (HIV) infection should be immunized as soon as possible after diagnosis. Immunization during chemotherapy or radiation therapy should be avoided. Routine use of PPSV23 vaccine is not recommended for American Indians, [place] Natives, or people younger than 65 years unless there are medical conditions that require PPSV23 vaccine. When indicated, people who have unknown immunization and have no record of immunization should receive PPSV23 vaccine. One-time revaccination 5 years after the first dose of PPSV23 is recommended for people aged 19-64 years who have chronic kidney failure, nephrotic syndrome, asplenia, or immunocompromised conditions. People who received 1-2 doses of PPSV23 before age 65 years should receive another dose of PPSV23 vaccine at age 65 years or later if at least 5 years have passed since the previous dose. Doses of PPSV23 are not needed for people immunized with PPSV23 at or after age 65 years.  Meningococcal vaccine. Adults with asplenia or persistent complement component deficiencies should receive 2 doses of quadrivalent meningococcal conjugate (MenACWY-D) vaccine. The doses should be obtained at least 2 months apart. Microbiologists working with certain meningococcal bacteria,  military recruits, people at risk during an outbreak, and people who travel to or live in countries with a high rate of meningitis should be immunized. A first-year college student up through age 21 years who is living in a residence hall should receive a dose if he did not receive a dose on or after his 16th birthday. Adults who have certain high-risk conditions should receive one or more doses of vaccine.  Hepatitis A vaccine. Adults who wish to be protected from this disease, have certain high-risk conditions, work with hepatitis A-infected animals, work in hepatitis A research labs, or   travel to or work in countries with a high rate of hepatitis A should be immunized. Adults who were previously unvaccinated and who anticipate close contact with an international adoptee during the first 60 days after arrival in the [place] from a country with a high rate of hepatitis A should be immunized.  Hepatitis B vaccine. Adults should be immunized if they wish to be protected from this disease, have certain high-risk conditions, may be exposed to blood or other infectious body fluids, are household contacts or sex partners of hepatitis B positive people, are clients or workers in certain care facilities, or travel to or work in countries with a high rate of hepatitis B.  Haemophilus influenzae type b (Hib) vaccine. A previously unvaccinated person with asplenia or sickle cell disease or having a scheduled splenectomy should receive 1 dose of Hib vaccine. Regardless of previous immunization, a recipient of a hematopoietic stem cell transplant should receive a 3-dose series 6-12 months after his successful transplant. Hib vaccine is not recommended for adults with HIV infection. Preventive Service / Frequency Ages 52 to 17  Blood pressure check.** / Every 1 to 2 years.  Lipid and cholesterol check.** / Every 5 years beginning at age 69.  Hepatitis C blood test.** / For any individual with known risks for  hepatitis C.  Skin self-exam. / Monthly.  Influenza vaccine. / Every year.  Tetanus, diphtheria, and acellular pertussis (Tdap, Td) vaccine.** / Consult your health care provider. 1 dose of Td every 10 years.  Varicella vaccine.** / Consult your health care provider.  HPV vaccine. / 3 doses over 6 months, if 72 or younger.  Measles, mumps, rubella (MMR) vaccine.** / You need at least 1 dose of MMR if you were born in 1957 or later. You may also need a second dose.  Pneumococcal 13-valent conjugate (PCV13) vaccine.** / Consult your health care provider.  Pneumococcal polysaccharide (PPSV23) vaccine.** / 1 to 2 doses if you smoke cigarettes or if you have certain conditions.  Meningococcal vaccine.** / 1 dose if you are age 35 to 60 years and a Market researcher living in a residence hall, or have one of several medical conditions. You may also need additional booster doses.  Hepatitis A vaccine.** / Consult your health care provider.  Hepatitis B vaccine.** / Consult your health care provider.  Haemophilus influenzae type b (Hib) vaccine.** / Consult your health care provider. Ages 35 to 8  Blood pressure check.** / Every 1 to 2 years.  Lipid and cholesterol check.** / Every 5 years beginning at age 57.  Lung cancer screening. / Every year if you are aged 44-80 years and have a 30-pack-year history of smoking and currently smoke or have quit within the past 15 years. Yearly screening is stopped once you have quit smoking for at least 15 years or develop a health problem that would prevent you from having lung cancer treatment.  Fecal occult blood test (FOBT) of stool. / Every year beginning at age 55 and continuing until age 73. You may not have to do this test if you get a colonoscopy every 10 years.  Flexible sigmoidoscopy** or colonoscopy.** / Every 5 years for a flexible sigmoidoscopy or every 10 years for a colonoscopy beginning at age 28 and continuing until age  1.  Hepatitis C blood test.** / For all people born from [birth year] through 1965 and any individual with known risks for hepatitis C.  Skin self-exam. / Monthly.  Influenza vaccine. / Every  year.  Tetanus, diphtheria, and acellular pertussis (Tdap/Td) vaccine.** / Consult your health care provider. 1 dose of Td every 10 years.  Varicella vaccine.** / Consult your health care provider.  Zoster vaccine.** / 1 dose for adults aged 53 years or older.  Measles, mumps, rubella (MMR) vaccine.** / You need at least 1 dose of MMR if you were born in 1957 or later. You may also need a second dose.  Pneumococcal 13-valent conjugate (PCV13) vaccine.** / Consult your health care provider.  Pneumococcal polysaccharide (PPSV23) vaccine.** / 1 to 2 doses if you smoke cigarettes or if you have certain conditions.  Meningococcal vaccine.** / Consult your health care provider.  Hepatitis A vaccine.** / Consult your health care provider.  Hepatitis B vaccine.** / Consult your health care provider.  Haemophilus influenzae type b (Hib) vaccine.** / Consult your health care provider. Ages 77 and over  Blood pressure check.** / Every 1 to 2 years.  Lipid and cholesterol check.**/ Every 5 years beginning at age 85.  Lung cancer screening. / Every year if you are aged 55-80 years and have a 30-pack-year history of smoking and currently smoke or have quit within the past 15 years. Yearly screening is stopped once you have quit smoking for at least 15 years or develop a health problem that would prevent you from having lung cancer treatment.  Fecal occult blood test (FOBT) of stool. / Every year beginning at age 33 and continuing until age 11. You may not have to do this test if you get a colonoscopy every 10 years.  Flexible sigmoidoscopy** or colonoscopy.** / Every 5 years for a flexible sigmoidoscopy or every 10 years for a colonoscopy beginning at age 28 and continuing until age 73.  Hepatitis C blood  test.** / For all people born from [birth year] through 1965 and any individual with known risks for hepatitis C.  Abdominal aortic aneurysm (AAA) screening.** / A one-time screening for ages 50 to 27 years who are current or former smokers.  Skin self-exam. / Monthly.  Influenza vaccine. / Every year.  Tetanus, diphtheria, and acellular pertussis (Tdap/Td) vaccine.** / 1 dose of Td every 10 years.  Varicella vaccine.** / Consult your health care provider.  Zoster vaccine.** / 1 dose for adults aged 34 years or older.  Pneumococcal 13-valent conjugate (PCV13) vaccine.** / Consult your health care provider.  Pneumococcal polysaccharide (PPSV23) vaccine.** / 1 dose for all adults aged 63 years and older.  Meningococcal vaccine.** / Consult your health care provider.  Hepatitis A vaccine.** / Consult your health care provider.  Hepatitis B vaccine.** / Consult your health care provider.  Haemophilus influenzae type b (Hib) vaccine.** / Consult your health care provider. **Family history and personal history of risk and conditions may change your health care provider's recommendations. Document Released: 08/03/2001 Document Revised: 06/12/2013 Document Reviewed: 11/02/2010 [facility] Patient Information 2015 [place], [place]. This information is not intended to replace advice given to you by your health care provider. Make sure you discuss any questions you have with your health care provider.

## 2014-12-05 NOTE — Progress Notes (Signed)
Subjective:   Russell Brown is a 69 y.o. male who presents for Medicare Annual/Subsequent preventive examination.  Review of Systems:   Review of Systems  Constitutional: Negative for activity change, appetite change and fatigue.  HENT: Negative for hearing loss, congestion, tinnitus and ear discharge.   Eyes: Negative for visual disturbance (see optho q1y -- vision corrected to 20/20 with glasses).  Respiratory: Negative for cough, chest tightness and shortness of breath.   Cardiovascular: Negative for chest pain, palpitations and leg swelling.  Gastrointestinal: Negative for abdominal pain, diarrhea, constipation and abdominal distention.  Genitourinary: Negative for urgency, frequency, decreased urine volume and difficulty urinating.  Musculoskeletal: Negative for back pain, arthralgias and gait problem.  Skin: Negative for color change, pallor and rash.  Neurological: Negative for dizziness, light-headedness, numbness and headaches.  Hematological: Negative for adenopathy. Does not bruise/bleed easily.  Psychiatric/Behavioral: Negative for suicidal ideas, confusion, sleep disturbance, self-injury, dysphoric mood, decreased concentration and agitation.  Pt is able to read and write and can do all ADLs No risk for falling No abuse/ violence in home          Objective:    Vitals: BP 128/82 mmHg  Pulse 66  Temp(Src) 99 F (37.2 C) (Oral)  Ht 6\' 1"  (1.854 m)  Wt 185 lb 9.6 oz (84.188 kg)  BMI 24.49 kg/m2  SpO2 98% BP 128/82 mmHg  Pulse 66  Temp(Src) 99 F (37.2 C) (Oral)  Ht 6\' 1"  (1.854 m)  Wt 185 lb 9.6 oz (84.188 kg)  BMI 24.49 kg/m2  SpO2 98% General appearance: alert, cooperative, appears stated age and no distress Head: Normocephalic, without obvious abnormality, atraumatic Eyes: conjunctivae/corneas clear. PERRL, EOM's intact. Fundi benign. Ears: normal TM's and external ear canals both ears Nose: Nares normal. Septum midline. Mucosa normal. No drainage  or sinus tenderness. Throat: lips, mucosa, and tongue normal; teeth and gums normal Neck: no adenopathy, no carotid bruit, no JVD, supple, symmetrical, trachea midline and thyroid not enlarged, symmetric, no tenderness/mass/nodules Back: symmetric, no curvature. ROM normal. No CVA tenderness. Lungs: clear to auscultation bilaterally Chest wall: no tenderness Heart: regular rate and rhythm, S1, S2 normal, no murmur, click, rub or gallop Abdomen: soft, non-tender; bowel sounds normal; no masses,  no organomegaly Male genitalia: urology Rectal: urology Extremities: extremities normal, atraumatic, no cyanosis or edema Pulses: 2+ and symmetric Skin: Skin color, texture, turgor normal. No rashes or lesions Lymph nodes: Cervical, supraclavicular, and axillary nodes normal. Neurologic: Alert and oriented X 3, normal strength and tone. Normal symmetric reflexes. Normal coordination and gait Psych- no depression, no anxiety Tobacco History  Smoking status  . Former Smoker -- 1.00 packs/day for 35 years  . Quit date: 08/18/1996  Smokeless tobacco  . Never Used     Counseling given: Not Answered   Past Medical History  Diagnosis Date  . Hypertension   . Thyroid disease   . Hyperlipemia   . GERD (gastroesophageal reflux disease)   . Asthma    Past Surgical History  Procedure Laterality Date  . Stomach surgery    . Hernia repair    . Appendectomy    . Abdominal surgery    . Back surgery      cspine  . Foot amputation    . Hand amputation    . Thyroidectomy    . Nissen fundoplication    . Septoplasty    . Cervical spine surgery     Family History  Problem Relation Age of Onset  .  Parkinsonism Mother   . Osteoporosis Mother   . Cancer Father     prostate,  skin  . Hyperlipidemia Father   . Hypertension Father   . Asthma Son   . Osteoporosis Paternal Aunt   . Alzheimer's disease Paternal Uncle   . Osteoporosis Maternal Grandmother   . Cancer Maternal Grandmother     lung   . Heart disease Paternal Grandmother     MI  . Heart disease Paternal Grandfather     MI  . Colon cancer Neg Hx   . Pancreatic cancer Neg Hx   . Stomach cancer Neg Hx    History  Sexual Activity  . Sexual Activity:  . Partners: Female    Outpatient Encounter Prescriptions as of 12/05/2014  Medication Sig  . aspirin (ASPIRIN EC) 81 MG EC tablet Take 81 mg by mouth daily.    [name] Kitchen atorvastatin (LIPITOR) 10 MG tablet Take 1 tablet by mouth  daily - repeat labs are due now  . buPROPion (WELLBUTRIN XL) 150 MG 24 hr tablet Take 1 tablet by mouth  daily  . Calcium Carbonate-Vitamin D (CALCIUM 600/VITAMIN D) 600-400 MG-UNIT per chew tablet Chew 1 tablet by mouth daily.  [name] Kitchen dutasteride (AVODART) 0.5 MG capsule Take 0.5 mg by mouth daily.  . fish oil-omega-3 fatty acids 1000 MG capsule Take 1 g by mouth daily.   [name] Kitchen levothyroxine (SYNTHROID, LEVOTHROID) 75 MCG tablet Take 1 tablet by mouth  daily before breakfast  . lisinopril-hydrochlorothiazide (PRINZIDE,ZESTORETIC) 10-12.5 MG per tablet Take 1 tablet by mouth  daily  . Multiple Vitamin (MULTIVITAMIN) tablet Take 1 tablet by mouth daily.  [name] Kitchen omeprazole (PRILOSEC) 20 MG capsule Take 1 capsule by mouth  daily  . sildenafil (VIAGRA) 100 MG tablet Take 1 tablet (100 mg total) by mouth daily as needed for erectile dysfunction.  . SYMBICORT 160-4.5 MCG/ACT inhaler Use 2 puffs two times daily  . vitamin C (ASCORBIC ACID) 500 MG tablet Take 500 mg by mouth daily.  . vitamin E 400 UNIT capsule Take 400 Units by mouth daily.   No facility-administered encounter medications on file as of 12/05/2014.    Activities of Daily Living In your present state of health, do you have any difficulty performing the following activities: 12/05/2014  Hearing? N  Vision? N  Difficulty concentrating or making decisions? N  Walking or climbing stairs? N  Dressing or bathing? N  Doing errands, shopping? N    Patient Care Team: Rosalita Chessman, DO as PCP -  General Festus Aloe, MD as Consulting Physician (Urology) Karoline Caldwell, OD as Referring Physician (Optometry)   Assessment:    cpe Exercise Activities and Dietary recommendations--- gym, canoeing     Goals    None     Fall Risk Fall Risk  12/05/2014 08/24/2013 08/24/2013  Falls in the past year? No No No   Depression Screen PHQ 2/9 Scores 12/05/2014 08/24/2013 08/24/2013 08/19/2011  PHQ - 2 Score 0 2 1 0  PHQ- 9 Score - 4 - -    Cognitive Testing AAOX3 nad  Immunization History  Administered Date(s) Administered  . H1N1 05/28/2008  . Influenza Whole 04/28/2007, 03/26/2008, 04/16/2009, 05/07/2010, 04/10/2011, 04/13/2012  . Influenza, High Dose Seasonal PF 03/05/2014  . Influenza, Seasonal, Injecte, Preservative Fre 03/06/2013  . Influenza-Unspecified 03/22/2011  . Pneumococcal Polysaccharide-23 04/11/2008, 04/18/2013  . Td 05/22/2009  . Zoster 05/22/2009   Screening Tests Health Maintenance  Topic Date Due  . PNA vac Low Risk Adult (2 of 2 -  PCV13) 04/18/2014  . INFLUENZA VACCINE  01/20/2015  . TETANUS/TDAP  05/23/2019  . COLONOSCOPY  05/11/2023  . ZOSTAVAX  Completed      Plan:   ghm utd Check labs During the course of the visit the patient was educated and counseled about the following appropriate screening and preventive services:   Vaccines to include Pneumoccal, Influenza, Hepatitis B, Td, Zostavax, HCV  Electrocardiogram  Cardiovascular Disease  Colorectal cancer screening  Diabetes screening  Prostate Cancer Screening  Glaucoma screening  Nutrition counseling   Smoking cessation counseling  Patient Instructions (the written plan) was given to the patient.   1. Essential hypertension stable - Basic metabolic panel - POCT urinalysis dipstick - Microalbumin / creatinine urine ratio  2. Hyperlipidemia LDL goal <100 Check labs - Basic metabolic panel - Hepatic function panel - Lipid panel  3. Hypothyroidism, unspecified hypothyroidism  type Check labs - TSH  4. Preventative health care See avs  Garnet Koyanagi, DO  12/05/2014

## 2014-12-05 NOTE — Progress Notes (Signed)
Pre visit review using our clinic review tool, if applicable. No additional management support is needed unless otherwise documented below in the visit note. 

## 2014-12-05 NOTE — Addendum Note (Signed)
Addended by: Ewing Schlein on: 12/05/2014 01:03 PM   Modules accepted: Orders

## 2014-12-29 ENCOUNTER — Other Ambulatory Visit: Payer: Self-pay | Admitting: Family Medicine

## 2015-02-16 ENCOUNTER — Other Ambulatory Visit: Payer: Self-pay | Admitting: Family Medicine

## 2015-06-22 ENCOUNTER — Other Ambulatory Visit: Payer: Self-pay | Admitting: Family Medicine

## 2015-09-14 ENCOUNTER — Other Ambulatory Visit: Payer: Self-pay | Admitting: Family Medicine

## 2015-09-15 NOTE — Telephone Encounter (Signed)
Patient will need to have labs drawn before future refills.

## 2015-09-22 ENCOUNTER — Encounter: Payer: Self-pay | Admitting: Family Medicine

## 2015-09-22 ENCOUNTER — Other Ambulatory Visit: Payer: Self-pay | Admitting: Family Medicine

## 2015-11-09 ENCOUNTER — Other Ambulatory Visit: Payer: Self-pay | Admitting: Family Medicine

## 2015-11-10 MED ORDER — ATORVASTATIN CALCIUM 10 MG PO TABS: ORAL_TABLET | ORAL | Status: DC

## 2015-11-10 NOTE — Addendum Note (Signed)
Addended by: Kelle Darting A on: 11/10/2015 09:36 AM   Modules accepted: Orders

## 2015-12-14 ENCOUNTER — Other Ambulatory Visit: Payer: Self-pay | Admitting: Family Medicine

## 2016-01-02 ENCOUNTER — Encounter: Payer: Self-pay | Admitting: Family Medicine

## 2016-01-02 ENCOUNTER — Ambulatory Visit (INDEPENDENT_AMBULATORY_CARE_PROVIDER_SITE_OTHER): Payer: Medicare Other | Admitting: Family Medicine

## 2016-01-02 VITALS — BP 128/79 | HR 64 | Temp 98.5°F | Wt 186.6 lb

## 2016-01-02 DIAGNOSIS — J4520 Mild intermittent asthma, uncomplicated: Secondary | ICD-10-CM | POA: Diagnosis not present

## 2016-01-02 DIAGNOSIS — I10 Essential (primary) hypertension: Secondary | ICD-10-CM

## 2016-01-02 DIAGNOSIS — J441 Chronic obstructive pulmonary disease with (acute) exacerbation: Secondary | ICD-10-CM

## 2016-01-02 DIAGNOSIS — Z0000 Encounter for general adult medical examination without abnormal findings: Secondary | ICD-10-CM | POA: Diagnosis not present

## 2016-01-02 DIAGNOSIS — E785 Hyperlipidemia, unspecified: Secondary | ICD-10-CM | POA: Diagnosis not present

## 2016-01-02 DIAGNOSIS — F329 Major depressive disorder, single episode, unspecified: Secondary | ICD-10-CM

## 2016-01-02 DIAGNOSIS — E039 Hypothyroidism, unspecified: Secondary | ICD-10-CM

## 2016-01-02 DIAGNOSIS — F32A Depression, unspecified: Secondary | ICD-10-CM

## 2016-01-02 MED ORDER — BUDESONIDE-FORMOTEROL FUMARATE 160-4.5 MCG/ACT IN AERO: 2.0000 | INHALATION_SPRAY | Freq: Two times a day (BID) | RESPIRATORY_TRACT | Status: DC

## 2016-01-02 MED ORDER — LISINOPRIL-HYDROCHLOROTHIAZIDE 10-12.5 MG PO TABS: ORAL_TABLET | ORAL | Status: DC

## 2016-01-02 MED ORDER — LEVOTHYROXINE SODIUM 75 MCG PO TABS: ORAL_TABLET | ORAL | Status: DC

## 2016-01-02 MED ORDER — BUPROPION HCL ER (XL) 150 MG PO TB24: ORAL_TABLET | ORAL | Status: DC

## 2016-01-02 MED ORDER — ATORVASTATIN CALCIUM 10 MG PO TABS: ORAL_TABLET | ORAL | Status: DC

## 2016-01-02 NOTE — Progress Notes (Signed)
_  Subjective:   Russell Brown is a 70 y.o. male who presents for Medicare Annual/Subsequent preventive examination.  Review of Systems:   Review of Systems  Constitutional: Negative for activity change, appetite change and fatigue.  HENT: Negative for hearing loss, congestion, tinnitus and ear discharge.   Eyes: Negative for visual disturbance (see optho q1y -- vision corrected to 20/20 with glasses).  Respiratory: Negative for cough, chest tightness and shortness of breath.   Cardiovascular: Negative for chest pain, palpitations and leg swelling.  Gastrointestinal: Negative for abdominal pain, diarrhea, constipation and abdominal distention.  Genitourinary: Negative for urgency, frequency, decreased urine volume and difficulty urinating.  Musculoskeletal: Negative for back pain, arthralgias and gait problem.  Skin: Negative for color change, pallor and rash.  Neurological: Negative for dizziness, light-headedness, numbness and headaches.  Hematological: Negative for adenopathy. Does not bruise/bleed easily.  Psychiatric/Behavioral: Negative for suicidal ideas, confusion, sleep disturbance, self-injury, dysphoric mood, decreased concentration and agitation.  Pt is able to read and write and can do all ADLs No risk for falling No abuse/ violence in home           Objective:    Vitals: BP 128/79 mmHg  Pulse 64  Temp(Src) 98.5 F (36.9 C) (Oral)  Wt 186 lb 9.6 oz (84.641 kg)  SpO2 99%  Body mass index is 24.62 kg/(m^2). BP 128/79 mmHg  Pulse 64  Temp(Src) 98.5 F (36.9 C) (Oral)  Wt 186 lb 9.6 oz (84.641 kg)  SpO2 99% General appearance: alert, cooperative, appears stated age and no distress Head: =- Eyes: conjunctivae/corneas clear. PERRL, EOM's intact. Fundi benign. Ears: normal TM's and external ear canals both ears Nose: Nares normal. Septum midline. Mucosa normal. No drainage or sinus tenderness. Throat: lips, mucosa, and tongue normal; teeth and gums  normal Neck: no adenopathy, no carotid bruit, no JVD, supple, symmetrical, trachea midline and thyroid not enlarged, symmetric, no tenderness/mass/nodules Back: symmetric, no curvature. ROM normal. No CVA tenderness. Lungs: clear to auscultation bilaterally Chest wall: no tenderness Heart: regular rate and rhythm, S1, S2 normal, no murmur, click, rub or gallop Abdomen: soft, non-tender; bowel sounds normal; no masses,  no organomegaly Male genitalia: normal, deferred Rectal: deferred Extremities: extremities normal, atraumatic, no cyanosis or edema Pulses: 2+ and symmetric Skin: Skin color, texture, turgor normal. No rashes or lesions Lymph nodes: Cervical, supraclavicular, and axillary nodes normal. Neurologic: Alert and oriented X 3, normal strength and tone. Normal symmetric reflexes. Normal coordination and gait Tobacco History  Smoking status  . Former Smoker -- 1.00 packs/day for 35 years  . Quit date: 08/18/1996  Smokeless tobacco  . Never Used     Counseling given: Not Answered   Past Medical History  Diagnosis Date  . Hypertension   . Thyroid disease   . Hyperlipemia   . GERD (gastroesophageal reflux disease)   . Asthma    Past Surgical History  Procedure Laterality Date  . Stomach surgery    . Hernia repair    . Appendectomy    . Abdominal surgery    . Back surgery      cspine  . Foot amputation    . Hand amputation    . Thyroidectomy    . Nissen fundoplication    . Septoplasty    . Cervical spine surgery     Family History  Problem Relation Age of Onset  . Parkinsonism Mother   . Osteoporosis Mother   . Cancer Father     prostate,  skin  .  Hyperlipidemia Father   . Hypertension Father   . Asthma Son   . Osteoporosis Paternal Aunt   . Alzheimer's disease Paternal Uncle   . Osteoporosis Maternal Grandmother   . Cancer Maternal Grandmother     lung  . Heart disease Paternal Grandmother     MI  . Heart disease Paternal Grandfather     MI  .  Colon cancer Neg Hx   . Pancreatic cancer Neg Hx   . Stomach cancer Neg Hx    History  Sexual Activity  . Sexual Activity:  . Partners: Female    Outpatient Encounter Prescriptions as of 01/02/2016  Medication Sig  . aspirin (ASPIRIN EC) 81 MG EC tablet Take 81 mg by mouth daily.    [name] Kitchen atorvastatin (LIPITOR) 10 MG tablet Take 1 tablet by mouth daily at 6 PM.  . buPROPion (WELLBUTRIN XL) 150 MG 24 hr tablet Take 1 tablet by mouth  daily  . Calcium Carbonate-Vitamin D (CALCIUM 600/VITAMIN D) 600-400 MG-UNIT per chew tablet Chew 1 tablet by mouth daily.  [name] Kitchen dutasteride (AVODART) 0.5 MG capsule Take 0.5 mg by mouth daily.  . fish oil-omega-3 fatty acids 1000 MG capsule Take 1 g by mouth daily.   [name] Kitchen levothyroxine (SYNTHROID, LEVOTHROID) 75 MCG tablet Take 1 tablet by mouth  daily before breakfast  . lisinopril-hydrochlorothiazide (PRINZIDE,ZESTORETIC) 10-12.5 MG tablet Take 1 tablet by mouth  daily  . Multiple Vitamin (MULTIVITAMIN) tablet Take 1 tablet by mouth daily.  [name] Kitchen omeprazole (PRILOSEC) 20 MG capsule Take 1 capsule by mouth  daily  . sildenafil (VIAGRA) 100 MG tablet Take 1 tablet (100 mg total) by mouth daily as needed for erectile dysfunction.  . vitamin C (ASCORBIC ACID) 500 MG tablet Take 500 mg by mouth daily.  . vitamin E 400 UNIT capsule Take 400 Units by mouth daily.  . [DISCONTINUED] atorvastatin (LIPITOR) 10 MG tablet Take 1 tablet by mouth daily at 6 PM.  . [DISCONTINUED] buPROPion (WELLBUTRIN XL) 150 MG 24 hr tablet Take 1 tablet by mouth  daily  . [DISCONTINUED] levothyroxine (SYNTHROID, LEVOTHROID) 75 MCG tablet Take 1 tablet by mouth  daily before breakfast  . [DISCONTINUED] lisinopril-hydrochlorothiazide (PRINZIDE,ZESTORETIC) 10-12.5 MG tablet Take 1 tablet by mouth  daily  . [DISCONTINUED] SYMBICORT 160-4.5 MCG/ACT inhaler Use 2 puffs two times daily  . budesonide-formoterol (SYMBICORT) 160-4.5 MCG/ACT inhaler Inhale 2 puffs into the lungs 2 (two) times daily.   No  facility-administered encounter medications on file as of 01/02/2016.    Activities of Daily Living In your present state of health, do you have any difficulty performing the following activities: 01/02/2016  Hearing? N  Vision? N  Difficulty concentrating or making decisions? N  Walking or climbing stairs? N  Dressing or bathing? N  Doing errands, shopping? N    Patient Care Team: Ann Held, DO as PCP - General Festus Aloe, MD as Consulting Physician (Urology) Karoline Caldwell, OD as Referring Physician (Optometry) Juanito Doom, MD as Consulting Physician (Pulmonary Disease)   Assessment:    CPE Exercise Activities and Dietary recommendations Current Exercise Habits: Structured exercise class, Type of exercise: walking;strength training/weights, Time (Minutes): 60, Frequency (Times/Week): 2, Weekly Exercise (Minutes/Week): 120, Intensity: Moderate  Goals    None     Fall Risk Fall Risk  01/02/2016 12/05/2014 12/05/2014 08/24/2013 08/24/2013  Falls in the past year? No No No No No   Depression Screen PHQ 2/9 Scores 01/02/2016 12/05/2014 12/05/2014 08/24/2013  PHQ - 2 Score 0 0  0 2  PHQ- 9 Score - - - 4    Cognitive Testing mmse 30/30  Immunization History  Administered Date(s) Administered  . H1N1 05/28/2008  . Influenza Whole 04/28/2007, 03/26/2008, 04/16/2009, 05/07/2010, 04/10/2011, 04/13/2012  . Influenza, High Dose Seasonal PF 03/05/2014, 03/17/2015  . Influenza, Seasonal, Injecte, Preservative Fre 03/06/2013  . Influenza-Unspecified 03/22/2011  . Pneumococcal Conjugate-13 12/05/2014  . Pneumococcal Polysaccharide-23 04/11/2008, 04/18/2013  . Td 05/22/2009  . Zoster 05/22/2009   Screening Tests Health Maintenance  Topic Date Due  . Hepatitis C Screening  1946-06-15  . INFLUENZA VACCINE  01/20/2016  . TETANUS/TDAP  05/23/2019  . COLONOSCOPY  05/11/2023  . ZOSTAVAX  Completed  . PNA vac Low Risk Adult  Completed      Plan:    During the course of  the visit the patient was educated and counseled about the following appropriate screening and preventive services:   Vaccines to include Pneumoccal, Influenza, Hepatitis B, Td, Zostavax, HCV  Electrocardiogram  Cardiovascular Disease  Colorectal cancer screening  Diabetes screening  Prostate Cancer Screening  Glaucoma screening  Nutrition counseling   Smoking cessation counseling  Patient Instructions (the written plan) was given to the patient.   1. COPD exacerbation ([place]) Per pulmonary - budesonide-formoterol (SYMBICORT) 160-4.5 MCG/ACT inhaler; Inhale 2 puffs into the lungs 2 (two) times daily.  Dispense: 1 Inhaler; Refill: 3  2. Asthma, mild intermittent, uncomplicated   - budesonide-formoterol (SYMBICORT) 160-4.5 MCG/ACT inhaler; Inhale 2 puffs into the lungs 2 (two) times daily.  Dispense: 1 Inhaler; Refill: 3  3. Preventative health care See above  4. Hyperlipidemia LDL goal <100 Check labs - POCT urinalysis dipstick; Future - TSH; Future - Lipid panel; Future - CBC with Differential/Platelet; Future - Comprehensive metabolic panel; Future - atorvastatin (LIPITOR) 10 MG tablet; Take 1 tablet by mouth daily at 6 PM.  Dispense: 90 tablet; Refill: 1  5. Hypothyroidism, unspecified hypothyroidism type  - TSH; Future - levothyroxine (SYNTHROID, LEVOTHROID) 75 MCG tablet; Take 1 tablet by mouth  daily before breakfast  Dispense: 90 tablet; Refill: 0  6. Essential hypertension stable - CBC with Differential/Platelet; Future - Comprehensive metabolic panel; Future - lisinopril-hydrochlorothiazide (PRINZIDE,ZESTORETIC) 10-12.5 MG tablet; Take 1 tablet by mouth  daily  Dispense: 90 tablet; Refill: 0  7. Depressed  - buPROPion (WELLBUTRIN XL) 150 MG 24 hr tablet; Take 1 tablet by mouth  daily  Dispense: 90 tablet; Refill: 0   Ann Held, DO  01/02/2016

## 2016-01-02 NOTE — Patient Instructions (Signed)

## 2016-01-02 NOTE — Progress Notes (Signed)
Pre visit review using our clinic review tool, if applicable. No additional management support is needed unless otherwise documented below in the visit note. 

## 2016-01-06 ENCOUNTER — Other Ambulatory Visit (INDEPENDENT_AMBULATORY_CARE_PROVIDER_SITE_OTHER): Payer: Medicare Other

## 2016-01-06 DIAGNOSIS — E785 Hyperlipidemia, unspecified: Secondary | ICD-10-CM | POA: Diagnosis not present

## 2016-01-06 DIAGNOSIS — I10 Essential (primary) hypertension: Secondary | ICD-10-CM | POA: Diagnosis not present

## 2016-01-06 DIAGNOSIS — E039 Hypothyroidism, unspecified: Secondary | ICD-10-CM

## 2016-01-06 LAB — COMPREHENSIVE METABOLIC PANEL
ALBUMIN: 4.3 g/dL (ref 3.5–5.2)
ALK PHOS: 72 U/L (ref 39–117)
ALT: 22 U/L (ref 0–53)
AST: 19 U/L (ref 0–37)
BUN: 15 mg/dL (ref 6–23)
CO2: 29 mEq/L (ref 19–32)
Calcium: 9.2 mg/dL (ref 8.4–10.5)
Chloride: 102 mEq/L (ref 96–112)
Creatinine, Ser: 1.08 mg/dL (ref 0.40–1.50)
GFR: 71.83 mL/min (ref >60.00)
Glucose, Bld: 99 mg/dL (ref 70–99)
Potassium: 4.3 mEq/L (ref 3.5–5.1)
SODIUM: 139 meq/L (ref 135–145)
TOTAL PROTEIN: 6.8 g/dL (ref 6.0–8.3)
Total Bilirubin: 0.7 mg/dL (ref 0.2–1.2)

## 2016-01-06 LAB — LIPID PANEL
CHOLESTEROL: 154 mg/dL (ref 0–200)
HDL: 40.8 mg/dL (ref >39.00)
LDL CALC: 83 mg/dL (ref 0–99)
NONHDL: 112.95
Total CHOL/HDL Ratio: 4
Triglycerides: 149 mg/dL (ref 0.0–149.0)
VLDL: 29.8 mg/dL (ref 0.0–40.0)

## 2016-01-06 LAB — TSH: TSH: 2.26 u[IU]/mL (ref 0.35–4.50)

## 2016-01-07 LAB — CBC WITH DIFFERENTIAL/PLATELET
Basophils Absolute: 0 10*3/uL (ref 0.0–0.1)
Basophils Relative: 0.4 % (ref 0.0–3.0)
EOS ABS: 0.2 10*3/uL (ref 0.0–0.7)
Eosinophils Relative: 2.8 % (ref 0.0–5.0)
HEMATOCRIT: 47.3 % (ref 39.0–52.0)
HEMOGLOBIN: 15.9 g/dL (ref 13.0–17.0)
LYMPHS PCT: 22.2 % (ref 12.0–46.0)
Lymphs Abs: 1.8 10*3/uL (ref 0.7–4.0)
MCHC: 33.6 g/dL (ref 30.0–36.0)
MCV: 88.8 fl (ref 78.0–100.0)
MONO ABS: 0.7 10*3/uL (ref 0.1–1.0)
Monocytes Relative: 8.8 % (ref 3.0–12.0)
Neutro Abs: 5.4 10*3/uL (ref 1.4–7.7)
Neutrophils Relative %: 65.8 % (ref 43.0–77.0)
Platelets: 251 10*3/uL (ref 150.0–400.0)
RBC: 5.33 Mil/uL (ref 4.22–5.81)
RDW: 13.8 % (ref 11.5–15.5)
WBC: 8.2 10*3/uL (ref 4.0–10.5)

## 2016-02-01 ENCOUNTER — Other Ambulatory Visit: Payer: Self-pay | Admitting: Family Medicine

## 2016-02-01 DIAGNOSIS — E039 Hypothyroidism, unspecified: Secondary | ICD-10-CM

## 2016-02-17 ENCOUNTER — Other Ambulatory Visit: Payer: Self-pay | Admitting: Family Medicine

## 2016-02-17 DIAGNOSIS — N528 Other male erectile dysfunction: Secondary | ICD-10-CM

## 2016-02-17 MED ORDER — SILDENAFIL CITRATE 20 MG PO TABS: ORAL_TABLET | ORAL | 3 refills | Status: DC

## 2016-02-19 ENCOUNTER — Ambulatory Visit (INDEPENDENT_AMBULATORY_CARE_PROVIDER_SITE_OTHER)
Admission: RE | Admit: 2016-02-19 | Discharge: 2016-02-19 | Disposition: A | Discharge status: Home / Self Care | Payer: Medicare Other | Source: Ambulatory Visit | Attending: Pulmonary Disease | Admitting: Pulmonary Disease

## 2016-02-19 ENCOUNTER — Encounter: Payer: Self-pay | Admitting: Pulmonary Disease

## 2016-02-19 ENCOUNTER — Ambulatory Visit (INDEPENDENT_AMBULATORY_CARE_PROVIDER_SITE_OTHER): Payer: Medicare Other | Admitting: Pulmonary Disease

## 2016-02-19 VITALS — BP 126/70 | HR 74 | Ht 73.0 in | Wt 189.8 lb

## 2016-02-19 DIAGNOSIS — R0602 Shortness of breath: Secondary | ICD-10-CM | POA: Diagnosis not present

## 2016-02-19 DIAGNOSIS — J449 Chronic obstructive pulmonary disease, unspecified: Secondary | ICD-10-CM | POA: Diagnosis not present

## 2016-02-19 DIAGNOSIS — R0600 Dyspnea, unspecified: Secondary | ICD-10-CM | POA: Insufficient documentation

## 2016-02-19 DIAGNOSIS — J300 Vasomotor rhinitis: Secondary | ICD-10-CM | POA: Insufficient documentation

## 2016-02-19 DIAGNOSIS — J45909 Unspecified asthma, uncomplicated: Secondary | ICD-10-CM | POA: Diagnosis not present

## 2016-02-19 LAB — NITRIC OXIDE: Nitric Oxide: 21

## 2016-02-19 MED ORDER — IPRATROPIUM BROMIDE 0.03 % NA SOLN: 2.0000 | Freq: Three times a day (TID) | NASAL | Status: DC

## 2016-02-19 MED ORDER — FLUTICASONE FUROATE-VILANTEROL 200-25 MCG/INH IN AEPB: 1.0000 | INHALATION_SPRAY | Freq: Every day | RESPIRATORY_TRACT | 0 refills | Status: DC

## 2016-02-19 MED ORDER — ALBUTEROL SULFATE HFA 108 (90 BASE) MCG/ACT IN AERS: 2.0000 | INHALATION_SPRAY | Freq: Four times a day (QID) | RESPIRATORY_TRACT | 3 refills | Status: DC | PRN

## 2016-02-19 NOTE — Assessment & Plan Note (Signed)
Vasomotor rhinitis (chronic non-allergic rhinitis): When inhaled corticosteroids or antihistamines are not helpful, ipratropium nasal spray (0.03%, 2 puffs each nare tid) is often effective as demonstrated in two trials of 285 and 253 patients (J Allergy Clin Immunol. E803998 Pt 2):1123. nd J Allergy Clin Immunol. E803998 Pt 2):1117).  -start ipratropium nasal spray (0.03%) 2 puffs bid-tid prn

## 2016-02-19 NOTE — Progress Notes (Signed)
Subjective:    Patient ID: Russell Brown, male    DOB: [birth year], 70 y.o.   MRN: [id number]  HPI Chief Complaint  Patient presents with  . Advice Only    self referral to establish pulmonary care for asthma.  c/o worsening SOB with exertion X3 mos.     Russell Brown is a self referral for his airways disease.  As a child he had severe asthma and was hospitalized frequently and needed an oxygen tent a number of times.  He has had dyspnea on exertion as an adult and was told that he had asthma as an adult after PFTs performed at our office.  He has been taking Symbicort since 2010.  He has not used his rescue inhaler this year because it was expired.  His dyspnea has been worsening this year.  Climbing stairs, exposure to humidity makes him worse.  He always has a lot of trouble breathing in the summer, but this year was worse than prior years.  He wonders if the symbicort is working, he has only uses it once per day.  He does not cough.  He had pneumonia in 2013.  He was treated as an outpatient.  Dyspnea: > can't breathe in deeply, can't get a deep breath > he is not coughing more > denies chest pain > no leg swelling. > he doesn't have worse trouble breathing in the allergy season despite allergic rhinitis symptoms  He has not have had heart trouble.  He had a heart catheterization 10 years that was normal.  Weight has been stable.    He moved 5 years ago into a townhome built in 2000, no basement, mold or mildew, no animals.  He retired from [employer].  He smoked for 35 years, 1 PPD, quit 1997.    Past Medical History:  Diagnosis Date  . Asthma   . GERD (gastroesophageal reflux disease)   . Hyperlipemia   . Hypertension   . Thyroid disease      Family History  Problem Relation Age of Onset  . Parkinsonism Mother   . Osteoporosis Mother   . Cancer Father     prostate,  skin  . Hyperlipidemia Father   . Hypertension Father   . Asthma  Son   . Osteoporosis Paternal Aunt   . Alzheimer's disease Paternal Uncle   . Osteoporosis Maternal Grandmother   . Cancer Maternal Grandmother     lung  . Heart disease Paternal Grandmother     MI  . Heart disease Paternal Grandfather     MI  . Colon cancer Neg Hx   . Pancreatic cancer Neg Hx   . Stomach cancer Neg Hx      Social History   Social History  . Marital status: Married    Spouse name: N/A  . Number of children: N/A  . Years of education: N/A   Occupational History  . retired [place]  . Smoking status: Former Smoker    Packs/day: 1.00    Years: 35.00    Quit date: 08/18/1996  . Smokeless tobacco: Never Used  . Alcohol use 7.2 oz/week    12 Cans of beer per week  . Drug use: No  . Sexual activity: Yes    Partners: Female   Other Topics Concern  . Not on file   Social History Narrative  . No narrative on file     Allergies  Allergen Reactions  .  Codeine Anxiety     Outpatient Medications Prior to Visit  Medication Sig Dispense Refill  . aspirin (ASPIRIN EC) 81 MG EC tablet Take 81 mg by mouth daily.      [name] Kitchen atorvastatin (LIPITOR) 10 MG tablet Take 1 tablet by mouth daily at 6 PM. 90 tablet 1  . buPROPion (WELLBUTRIN XL) 150 MG 24 hr tablet Take 1 tablet by mouth  daily 90 tablet 0  . Calcium Carbonate-Vitamin D (CALCIUM 600/VITAMIN D) 600-400 MG-UNIT per chew tablet Chew 1 tablet by mouth daily.    [name] Kitchen dutasteride (AVODART) 0.5 MG capsule Take 0.5 mg by mouth daily.    . fish oil-omega-3 fatty acids 1000 MG capsule Take 1 g by mouth daily.     [name] Kitchen levothyroxine (SYNTHROID, LEVOTHROID) 75 MCG tablet Take 1 tablet by mouth  daily before breakfast 90 tablet 3  . lisinopril-hydrochlorothiazide (PRINZIDE,ZESTORETIC) 10-12.5 MG tablet Take 1 tablet by mouth  daily 90 tablet 0  . Multiple Vitamin (MULTIVITAMIN) tablet Take 1 tablet by mouth daily.    [name] Kitchen omeprazole (PRILOSEC) 20 MG capsule Take 1 capsule by mouth  daily 90  capsule 0  . sildenafil (REVATIO) 20 MG tablet 5 po qd prn 90 tablet 3  . vitamin C (ASCORBIC ACID) 500 MG tablet Take 500 mg by mouth daily.    . vitamin E 400 UNIT capsule Take 400 Units by mouth daily.    . budesonide-formoterol (SYMBICORT) 160-4.5 MCG/ACT inhaler Inhale 2 puffs into the lungs 2 (two) times daily. 1 Inhaler 3  . sildenafil (VIAGRA) 100 MG tablet Take 1 tablet (100 mg total) by mouth daily as needed for erectile dysfunction. 18 tablet 1   No facility-administered medications prior to visit.       Review of Systems  Constitutional: Negative for fever and unexpected weight change.  HENT: Negative for congestion, dental problem, ear pain, nosebleeds, postnasal drip, rhinorrhea, sinus pressure, sneezing, sore throat and trouble swallowing.   Eyes: Negative for redness and itching.  Respiratory: Positive for shortness of breath. Negative for cough, chest tightness and wheezing.   Cardiovascular: Negative for palpitations and leg swelling.  Gastrointestinal: Negative for nausea and vomiting.  Genitourinary: Negative for dysuria.  Musculoskeletal: Negative for joint swelling.  Skin: Negative for rash.  Neurological: Negative for headaches.  Hematological: Does not bruise/bleed easily.  Psychiatric/Behavioral: Negative for dysphoric mood. The patient is not nervous/anxious.        Objective:   Physical Exam Vitals:   02/19/16 0855  BP: 126/70  Pulse: 74  SpO2: 98%  Weight: 189 lb 12.8 oz (86.1 kg)  Height: 6\' 1"  (1.854 m)  RA  Gen: well appearing, no acute distress HENT: NCAT, OP clear, neck supple without masses Eyes: PERRL, EOMi Lymph: no cervical lymphadenopathy PULM: Crackles bases bilaterally, normal air movement CV: RRR, no mgr, no JVD GI: BS+, soft, nontender, no hsm Derm: no rash or skin breakdown MSK: normal bulk and tone, missing 2nd/3rd digits left hand Neuro: A&Ox4, CN II-XII intact, strength 5/5 in all 4 extremities Psyche: normal mood and  affect   July 2017 labs reviewed: Hgb OK, Cr OK, HCO3 19 March 2012 chest x-ray images personally reviewed showing hyperinflation worrisome for emphysema    Assessment & Plan:  COPD with asthma ([place]) He has a COPD asthma overlap syndrome for which she is currently only taking one half of the recommended dose for a controller medicine right now. So in summary, I think is increasing shortness of breath is mostly  due to the fact that he is under treating his COPD and asthma. However, the differential diagnosis of dyspnea is broad and based on the crackles I hear on exam I do worry about a second pulmonary process such as interstitial pulmonary fibrosis.  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 E803998 Pt 2):1123. nd J Allergy Clin Immunol. E803998 Pt 2):1117).  -start ipratropium nasal spray (0.03%) 2 puffs bid-tid prn     Current Outpatient Prescriptions:  .  aspirin (ASPIRIN EC) 81 MG EC tablet, Take 81 mg by mouth daily.  , Disp: , Rfl:  .  atorvastatin (LIPITOR) 10 MG tablet, Take 1 tablet by mouth daily at 6 PM., Disp: 90 tablet, Rfl: 1 .  buPROPion (WELLBUTRIN XL) 150 MG 24 hr tablet, Take 1 tablet by mouth  daily, Disp: 90 tablet, Rfl: 0 .  Calcium Carbonate-Vitamin D (CALCIUM 600/VITAMIN D) 600-400 MG-UNIT per chew tablet, Chew 1 tablet by mouth daily., Disp: , Rfl:  .  dutasteride (AVODART) 0.5 MG capsule,  Take 0.5 mg by mouth daily., Disp: , Rfl:  .  fish oil-omega-3 fatty acids 1000 MG capsule, Take 1 g by mouth daily. , Disp: , Rfl:  .  levothyroxine (SYNTHROID, LEVOTHROID) 75 MCG tablet, Take 1 tablet by mouth  daily before breakfast, Disp: 90 tablet, Rfl: 3 .  lisinopril-hydrochlorothiazide (PRINZIDE,ZESTORETIC) 10-12.5 MG tablet, Take 1 tablet by mouth  daily, Disp: 90 tablet, Rfl: 0 .  Multiple Vitamin (MULTIVITAMIN) tablet, Take 1 tablet by mouth daily., Disp: , Rfl:  .  omeprazole (PRILOSEC) 20 MG capsule, Take 1 capsule by mouth  daily, Disp: 90 capsule, Rfl: 0 .  sildenafil (REVATIO) 20 MG tablet, 5 po qd prn, Disp: 90 tablet, Rfl: 3 .  vitamin C (ASCORBIC ACID) 500 MG tablet, Take 500 mg by mouth daily., Disp: , Rfl:  .  vitamin E 400 UNIT capsule, Take 400 Units by mouth daily., Disp: , Rfl:  .  albuterol (PROAIR HFA) 108 (90 Base) MCG/ACT inhaler, Inhale 2 puffs into the lungs every 6 (six) hours as needed for wheezing or shortness of breath., Disp: 1 Inhaler, Rfl: 3 .  fluticasone furoate-vilanterol (BREO ELLIPTA) 200-25 MCG/INH AEPB, Inhale 1 puff into the lungs daily., Disp: 1 each, Rfl: 0  Current Facility-Administered Medications:  .  ipratropium (ATROVENT) 0.03 % nasal spray 2 spray, 2 spray, Each Nare, TID, Juanito Doom, MD

## 2016-02-19 NOTE — Patient Instructions (Addendum)
Stop Symbicort Take Breo 1 puff daily If the Breo is helpful, call us and we will send in a prescription We will call you with the results of the chest x-ray Were going to have you follow-up with [place] in 2 weeks, with our nurse practitioner preferably in [facility] Use the nose spray two sprays prior to eating each nostril

## 2016-02-19 NOTE — Assessment & Plan Note (Signed)
He has a COPD asthma overlap syndrome for which she is currently only taking one half of the recommended dose for a controller medicine right now. So in summary, I think is increasing shortness of breath is mostly due to the fact that he is under treating his COPD and asthma. However, the differential diagnosis of dyspnea is broad and based on the crackles I hear on exam I do worry about a second pulmonary process such as interstitial pulmonary fibrosis.  Plan summary Change Symbicort to Breo to give all day control (he is currently only using Symbicort once a day) Renew albuterol to use on an as-needed basis Chest x-ray today If no improvement by the next visit, we will need to perform full pulmonary function testing and possibly a CT scan of the chest

## 2016-02-19 NOTE — Assessment & Plan Note (Signed)
As above.

## 2016-02-23 ENCOUNTER — Encounter: Payer: Self-pay | Admitting: Pulmonary Disease

## 2016-02-24 ENCOUNTER — Telehealth: Payer: Self-pay | Admitting: Pulmonary Disease

## 2016-02-24 DIAGNOSIS — J449 Chronic obstructive pulmonary disease, unspecified: Secondary | ICD-10-CM

## 2016-02-24 MED ORDER — FLUTICASONE FUROATE-VILANTEROL 200-25 MCG/INH IN AEPB: 1.0000 | INHALATION_SPRAY | Freq: Every day | RESPIRATORY_TRACT | 3 refills | Status: AC

## 2016-02-24 MED ORDER — IPRATROPIUM BROMIDE 0.03 % NA SOLN: 2.0000 | Freq: Three times a day (TID) | NASAL | 3 refills | Status: DC | PRN

## 2016-02-24 NOTE — Telephone Encounter (Signed)
MyChart Message:  DR Lake Bells:    THE BREO ELLIPTA INHALER IS WORKING GREAT! I CAN FINALLY TAKE A DEEP BREATH AGAIN! PLEASE FAX   A 90 DAY PRESCRIPTION TO [facility] AND I WILL FOLLOWUP WITH MS PARRETT ON SEP 14 - THANK YOU SINCERELY FOR HELPING ME TO BREATHE AGAIN! ALSO, [facility] ADVISES THAT THEY HAVE NOT RECEIVED THE RX FOR FLUTICASONE PROPIONATE NASAL SPRAY -     REGARDS,  DON Constantin   ----- Rx sent to pharmacy.  (Pt should be on Ipratropium, not Fluticasone per BQ's notes, Ipratropium sent to pharmacy) Patient aware via [place].

## 2016-02-27 ENCOUNTER — Encounter: Payer: Self-pay | Admitting: Family Medicine

## 2016-03-03 ENCOUNTER — Encounter: Payer: Self-pay | Admitting: Family Medicine

## 2016-03-03 DIAGNOSIS — N528 Other male erectile dysfunction: Secondary | ICD-10-CM

## 2016-03-04 ENCOUNTER — Ambulatory Visit (INDEPENDENT_AMBULATORY_CARE_PROVIDER_SITE_OTHER): Payer: Medicare Other | Admitting: Adult Health

## 2016-03-04 ENCOUNTER — Encounter: Payer: Self-pay | Admitting: Adult Health

## 2016-03-04 DIAGNOSIS — J45909 Unspecified asthma, uncomplicated: Secondary | ICD-10-CM

## 2016-03-04 DIAGNOSIS — J449 Chronic obstructive pulmonary disease, unspecified: Secondary | ICD-10-CM

## 2016-03-04 DIAGNOSIS — J300 Vasomotor rhinitis: Secondary | ICD-10-CM | POA: Diagnosis not present

## 2016-03-04 DIAGNOSIS — Z23 Encounter for immunization: Secondary | ICD-10-CM | POA: Diagnosis not present

## 2016-03-04 MED ORDER — SILDENAFIL CITRATE 20 MG PO TABS: ORAL_TABLET | ORAL | 0 refills | Status: DC

## 2016-03-04 NOTE — Telephone Encounter (Signed)
please advise     KP 

## 2016-03-04 NOTE — Telephone Encounter (Signed)
Angie has been working with this---- pt does not want it sent to ins He is taking rx to pharmacy himself for generic

## 2016-03-04 NOTE — Assessment & Plan Note (Signed)
Controlled on rx   

## 2016-03-04 NOTE — Patient Instructions (Signed)
Flu shot today .  Continue on BREO daily ., rinse after use.  Follow up with Dr. Elsworth Soho  In 6 months and As needed

## 2016-03-04 NOTE — Progress Notes (Signed)
Subjective:    Patient ID: Russell Brown, male    DOB: February 11, 1946, 70 y.o.   MRN: [id number]  HPI 70 yo male former smoker seen for pulmonary consult on 02/19/16  for COPD w/ asthma .  Retired Engineer, structural.   TEST  Spirometry 02/19/16 Moderate COPD with FEV1 56%, ratio 57, FVC 73% CXR 02/19/16 showed coarse interstitial markings.  FENO 21ppm  03/04/2016 Follow up : COPD/Asthma  Pt returns for a 2 week follow up . He was seen 2 weeks ago for pulmonary consult for Asthma/ COPD. He was on Symbicort for years.  Continued to have dyspnea and hard to take in deep breath.  Spirometry last ov showed moderate airflow obstruction with FEV1 56%, ratio 57 . He was felt to have COPD with asthma overlap syndrome cancer CXR showed coarse interstitial markings. He was changed to [facility]. He says he feels this has really helped him. Says it has made a world of difference in his breathing.  Exercises at gym 2-3 times a week. Does treadmill and weights.  PVX and Prevnar are utd.   He is on ACE inhibitor, denies cough .  He denies chest pain, orthopnea, edema or fever.   Past Medical History:  Diagnosis Date  . Asthma   . GERD (gastroesophageal reflux disease)   . Hyperlipemia   . Hypertension   . Thyroid disease    Current Outpatient Prescriptions on File Prior to Visit  Medication Sig Dispense Refill  . albuterol (PROAIR HFA) 108 (90 Base) MCG/ACT inhaler Inhale 2 puffs into the lungs every 6 (six) hours as needed for wheezing or shortness of breath. 1 Inhaler 3  . aspirin (ASPIRIN EC) 81 MG EC tablet Take 81 mg by mouth daily.      [name] Kitchen atorvastatin (LIPITOR) 10 MG tablet Take 1 tablet by mouth daily at 6 PM. 90 tablet 1  . buPROPion (WELLBUTRIN XL) 150 MG 24 hr tablet Take 1 tablet by mouth  daily 90 tablet 0  . Calcium Carbonate-Vitamin D (CALCIUM 600/VITAMIN D) 600-400 MG-UNIT per chew tablet Chew 1 tablet by mouth daily.    [name] Kitchen dutasteride (AVODART) 0.5 MG capsule Take 0.5 mg by mouth daily.      . fish oil-omega-3 fatty acids 1000 MG capsule Take 1 g by mouth daily.     [name] Kitchen ipratropium (ATROVENT) 0.03 % nasal spray Place 2 sprays into both nostrils 3 (three) times daily as needed for rhinitis. 90 mL 3  . levothyroxine (SYNTHROID, LEVOTHROID) 75 MCG tablet Take 1 tablet by mouth  daily before breakfast 90 tablet 3  . lisinopril-hydrochlorothiazide (PRINZIDE,ZESTORETIC) 10-12.5 MG tablet Take 1 tablet by mouth  daily 90 tablet 0  . Multiple Vitamin (MULTIVITAMIN) tablet Take 1 tablet by mouth daily.    [name] Kitchen omeprazole (PRILOSEC) 20 MG capsule Take 1 capsule by mouth  daily 90 capsule 0  . vitamin C (ASCORBIC ACID) 500 MG tablet Take 500 mg by mouth daily.    . vitamin E 400 UNIT capsule Take 400 Units by mouth daily.    . sildenafil (REVATIO) 20 MG tablet 5 po qd prn (Patient not taking: Reported on 03/04/2016) 90 tablet 3   No current facility-administered medications on file prior to visit.       Review of Systems Constitutional:   No  weight loss, night sweats,  Fevers, chills, fatigue, or  lassitude.  HEENT:   No headaches,  Difficulty swallowing,  Tooth/dental problems, or  Sore throat,  No sneezing, itching, ear ache, nasal congestion, post nasal drip,   CV:  No chest pain,  Orthopnea, PND, swelling in lower extremities, anasarca, dizziness, palpitations, syncope.   GI  No heartburn, indigestion, abdominal pain, nausea, vomiting, diarrhea, change in bowel habits, loss of appetite, bloody stools.   Resp: No shortness of breath with exertion or at rest.  No excess mucus, no productive cough,  No non-productive cough,  No coughing up of blood.  No change in color of mucus.  No wheezing.  No chest wall deformity  Skin: no rash or lesions.  GU: no dysuria, change in color of urine, no urgency or frequency.  No flank pain, no hematuria   MS:  No joint pain or swelling.  No decreased range of motion.  No back pain.  Psych:  No change in mood or affect. No depression  or anxiety.  No memory loss.         Objective:   Physical Exam  Vitals:   03/04/16 0859  BP: (!) 147/87  Pulse: 70  SpO2: 98%  Weight: 190 lb (86.2 kg)  Height: 6\' 1"  (1.854 m)   GEN: A/Ox3; pleasant , NAD, well nourished    HEENT:  [place]/AT,  EACs-clear, TMs-wnl, NOSE-clear, THROAT-clear, no lesions, no postnasal drip or exudate noted.   NECK:  Supple w/ fair ROM; no JVD; normal carotid impulses w/o bruits; no thyromegaly or nodules palpated; no lymphadenopathy.    RESP  Clear  P & A; w/o, wheezes/ rales/ or rhonchi. no accessory muscle use, no dullness to percussion  CARD:  RRR, no m/r/g  , no peripheral edema, pulses intact, no cyanosis or clubbing.  GI:   Soft & nt; nml bowel sounds; no organomegaly or masses detected.   Musco: Warm bil, no  joint swelling noted.   Neuro: alert, no focal deficits noted.    Skin: Warm, no lesions or rashes  Tammy Parrett NP-C  [facility]  03/04/2016      Assessment & Plan:

## 2016-03-04 NOTE — Assessment & Plan Note (Signed)
Improved control on BREO .  Cont w/ exercise as tolerated.  Flu shot today   Plan  Patient Instructions  Flu shot today .  Continue on BREO daily ., rinse after use.  Follow up with Dr. Elsworth Soho  In 6 months and As needed

## 2016-03-06 NOTE — Progress Notes (Signed)
Reviewed & agree with plan  

## 2016-03-08 ENCOUNTER — Telehealth: Payer: Self-pay | Admitting: *Deleted

## 2016-03-08 NOTE — Telephone Encounter (Signed)
Received denial letter for Sildenafil via from Medicare Part D.  See MyChart note on (03/03/16).  Pt decided to pay for medication out of pocket.  Denial letter sent to be scanned.//AB/CMA

## 2016-03-08 NOTE — Progress Notes (Signed)
Reviewed, agree 

## 2016-03-31 ENCOUNTER — Other Ambulatory Visit: Payer: Self-pay | Admitting: Family Medicine

## 2016-03-31 DIAGNOSIS — F32A Depression, unspecified: Secondary | ICD-10-CM

## 2016-03-31 DIAGNOSIS — F329 Major depressive disorder, single episode, unspecified: Secondary | ICD-10-CM

## 2016-03-31 DIAGNOSIS — I10 Essential (primary) hypertension: Secondary | ICD-10-CM

## 2016-05-19 ENCOUNTER — Other Ambulatory Visit: Payer: Self-pay

## 2016-05-19 DIAGNOSIS — E785 Hyperlipidemia, unspecified: Secondary | ICD-10-CM

## 2016-05-19 MED ORDER — ATORVASTATIN CALCIUM 10 MG PO TABS: ORAL_TABLET | ORAL | 1 refills | Status: DC

## 2016-05-19 MED ORDER — LEVOTHYROXINE SODIUM 75 MCG PO TABS: ORAL_TABLET | ORAL | 1 refills | Status: DC

## 2016-05-20 ENCOUNTER — Other Ambulatory Visit: Payer: Self-pay

## 2016-05-20 MED ORDER — FLUTICASONE FUROATE-VILANTEROL 200-25 MCG/INH IN AEPB: 1.0000 | INHALATION_SPRAY | Freq: Every day | RESPIRATORY_TRACT | 5 refills | Status: DC

## 2016-05-27 ENCOUNTER — Other Ambulatory Visit: Payer: Self-pay

## 2016-05-27 ENCOUNTER — Telehealth: Payer: Self-pay | Admitting: Family Medicine

## 2016-05-27 DIAGNOSIS — E785 Hyperlipidemia, unspecified: Secondary | ICD-10-CM

## 2016-05-27 MED ORDER — ATORVASTATIN CALCIUM 10 MG PO TABS: ORAL_TABLET | ORAL | 1 refills | Status: DC

## 2016-05-27 NOTE — Telephone Encounter (Signed)
Rx Lipitor sent LB

## 2016-05-27 NOTE — Telephone Encounter (Signed)
°  Relation to [id number] Call back number:[phone] Pharmacy: [facility], [place] [phone] (Phone) [phone] (Fax)     Reason for call:  Patient received email from mail order this morning stating atorvastatin (LIPITOR) 10 MG tablet was never received, requesting Rx please send again.

## 2016-05-31 ENCOUNTER — Other Ambulatory Visit: Payer: Self-pay | Admitting: *Deleted

## 2016-05-31 DIAGNOSIS — E785 Hyperlipidemia, unspecified: Secondary | ICD-10-CM

## 2016-05-31 MED ORDER — ATORVASTATIN CALCIUM 10 MG PO TABS: ORAL_TABLET | ORAL | 1 refills | Status: DC

## 2016-05-31 NOTE — Telephone Encounter (Signed)
Patient called stating that [facility] has not received the Rx for his Lipitor. Plse resubmit. Patient is out of medication

## 2016-06-01 NOTE — Telephone Encounter (Signed)
lmom that I resent medication yesterday to [facility]

## 2016-06-01 NOTE — Telephone Encounter (Incomplete)
lmom that l

## 2016-06-11 ENCOUNTER — Telehealth: Payer: Self-pay | Admitting: Family Medicine

## 2016-06-11 ENCOUNTER — Ambulatory Visit (INDEPENDENT_AMBULATORY_CARE_PROVIDER_SITE_OTHER): Payer: Medicare Other | Admitting: Family Medicine

## 2016-06-11 ENCOUNTER — Encounter: Payer: Self-pay | Admitting: Family Medicine

## 2016-06-11 ENCOUNTER — Other Ambulatory Visit: Payer: Self-pay | Admitting: Family Medicine

## 2016-06-11 VITALS — BP 124/74 | HR 72 | Temp 97.9°F | Wt 188.4 lb

## 2016-06-11 DIAGNOSIS — J189 Pneumonia, unspecified organism: Secondary | ICD-10-CM

## 2016-06-11 DIAGNOSIS — J4540 Moderate persistent asthma, uncomplicated: Secondary | ICD-10-CM

## 2016-06-11 MED ORDER — BECLOMETHASONE DIPROPIONATE 40 MCG/ACT IN AERS: 2.0000 | INHALATION_SPRAY | Freq: Two times a day (BID) | RESPIRATORY_TRACT | 1 refills | Status: DC

## 2016-06-11 MED ORDER — AZITHROMYCIN 250 MG PO TABS: ORAL_TABLET | ORAL | 0 refills | Status: DC

## 2016-06-11 MED ORDER — FLUTICASONE PROPIONATE HFA 110 MCG/ACT IN AERO: 2.0000 | INHALATION_SPRAY | Freq: Two times a day (BID) | RESPIRATORY_TRACT | 1 refills | Status: DC

## 2016-06-11 NOTE — Progress Notes (Signed)
Pre visit review using our clinic review tool, if applicable. No additional management support is needed unless otherwise documented below in the visit note. 

## 2016-06-11 NOTE — Telephone Encounter (Signed)
Patient's wife called stating that the pharmacy said that there were problems with the medications that were called in today. The pharmacy stated that it was too soon to receive the azithromycin (ZITHROMAX) 250 MG tablet And the beclomethasone (QVAR) 40 MCG/ACT inhaler needs a PA please advise   Phone: [phone]

## 2016-06-11 NOTE — Patient Instructions (Signed)
Asthma Action Plan There are 3 zones to consider: 1. Green Zone- This is the zone we hope to keep you in. Avoiding allergens and compliance with inhalers will keep you in this safe zone. No need to take anything extra while in this zone.  2. Yellow Zone- This zone is where we can save time, money and visits. You are not doing well enough to be considered in the green zone, but not bad enough to be in the red zone. This is where we need to remove any allergen or factor that is contributing to your breathing issues. You have a separate inhaler (inhaled steroid) to take twice daily in addition to your other inhalers. This should give you the push you need to get back to the green zone. Contact our office if you have any questions. 3. Red Zone- This is the zone where you should consider calling 911 vs going to the ER. Despite compliance with inhalers/meds (or maybe because we haven't been using them), our breathing isn't where we want it to be. Albuterol isn't helping as much either and you need medical care. This is the zone we try to avoid as much as possible!

## 2016-06-11 NOTE — Telephone Encounter (Signed)
Inhaler sent to correct pharmacy.

## 2016-06-11 NOTE — Telephone Encounter (Signed)
Why is it too soon? He wasn't just on it that I can tell? The preferred inhaler was called in.

## 2016-06-11 NOTE — Telephone Encounter (Signed)
Pharmacy stated that the azithromycin (ZITHROMAX) 250 MG tablet has been sent to another pharmacy as well and that is why it cannot be picked up there. The pharmacist stated that she would call and find out where it went and get it canceled so the patient can get the [place] today.   Also, the new inhaler,  fluticasone (FLOVENT HFA) 110 MCG/ACT inhaler was sent to [facility]. She will call and get that canceled for [facility] if we can send in the rx to [facility].    Pharmacy: [facility] [place], [place]

## 2016-06-11 NOTE — Progress Notes (Signed)
Chief Complaint  Patient presents with  . Cough  . URI    Russell Brown here for URI complaints.  Duration: 3 weeks  Associated symptoms: cough, body aches, SOB  +Hx of asthma, has not been using his albuterol inhaler though Denies: subjective fever, ear pain, ear drainage, runny/stuffy nose, and sore throat Treatment to date: Pseudophed  Sick contacts: No  ROS:  Const: Denies fevers HEENT: As noted in HPI Lungs: +SOB  Past Medical History:  Diagnosis Date  . Asthma   . GERD (gastroesophageal reflux disease)   . Hyperlipemia   . Hypertension   . Thyroid disease    Family History  Problem Relation Age of Onset  . Parkinsonism Mother   . Osteoporosis Mother   . Cancer Father     prostate,  skin  . Hyperlipidemia Father   . Hypertension Father   . Asthma Son   . Osteoporosis Paternal Aunt   . Alzheimer's disease Paternal Uncle   . Osteoporosis Maternal Grandmother   . Cancer Maternal Grandmother     lung  . Heart disease Paternal Grandmother     MI  . Heart disease Paternal Grandfather     MI  . Colon cancer Neg Hx   . Pancreatic cancer Neg Hx   . Stomach cancer Neg Hx     BP 124/74 (BP Location: Left Arm, Patient Position: Sitting, Cuff Size: Normal)   Pulse 72   Temp 97.9 F (36.6 C) (Oral)   Wt 188 lb 6.4 oz (85.5 kg)   SpO2 97% Comment: RA  BMI 24.86 kg/m  General: Awake, alert, appears stated age HEENT: AT, [place], ears patent b/l and TM's neg, nares patent w/o discharge, no sinus tenderness, pharynx pink and without exudates, MMM Neck: No masses or asymmetry Heart: RRR, no murmurs, no bruits Lungs: CTAB, no accessory muscle use Psych: Age appropriate judgment and insight, normal mood and affect  Walking pneumonia - Plan: azithromycin (ZITHROMAX) 250 MG tablet, DISCONTINUED: azithromycin (ZITHROMAX) 250 MG tablet  Moderate persistent asthma without complication - Plan: beclomethasone (QVAR) 40 MCG/ACT inhaler, DISCONTINUED: beclomethasone (QVAR)  40 MCG/ACT inhaler  Orders as above. Gave a skeleton asthma action plan with instructions to use the Qvar when he is in the Yellow zone, like now. Rinse mouth out after use, he already does this with his Breo.  Warning signs on when to seek emergent care discussed with him and his wife. F/u in 1 week if symptoms worsen or fail to improve. Pt voiced understanding and agreement to the plan.  [place], DO 06/11/16 4:02 PM

## 2016-06-15 ENCOUNTER — Telehealth: Payer: Self-pay | Admitting: Family Medicine

## 2016-06-15 ENCOUNTER — Ambulatory Visit (HOSPITAL_BASED_OUTPATIENT_CLINIC_OR_DEPARTMENT_OTHER)
Admission: RE | Admit: 2016-06-15 | Discharge: 2016-06-15 | Disposition: A | Discharge status: Home / Self Care | Payer: Medicare Other | Source: Ambulatory Visit | Attending: Family Medicine | Admitting: Family Medicine

## 2016-06-15 ENCOUNTER — Telehealth: Payer: Self-pay

## 2016-06-15 ENCOUNTER — Other Ambulatory Visit: Payer: Self-pay | Admitting: Family Medicine

## 2016-06-15 DIAGNOSIS — R059 Cough, unspecified: Secondary | ICD-10-CM

## 2016-06-15 DIAGNOSIS — J984 Other disorders of lung: Secondary | ICD-10-CM | POA: Insufficient documentation

## 2016-06-15 DIAGNOSIS — R05 Cough: Secondary | ICD-10-CM

## 2016-06-15 DIAGNOSIS — J189 Pneumonia, unspecified organism: Secondary | ICD-10-CM

## 2016-06-15 DIAGNOSIS — R918 Other nonspecific abnormal finding of lung field: Secondary | ICD-10-CM | POA: Diagnosis not present

## 2016-06-15 MED ORDER — LEVOFLOXACIN 500 MG PO TABS: 500.0000 mg | ORAL_TABLET | Freq: Every day | ORAL | 0 refills | Status: DC

## 2016-06-15 MED ORDER — PROMETHAZINE-DM 6.25-15 MG/5ML PO SYRP: 5.0000 mL | ORAL_SOLUTION | Freq: Four times a day (QID) | ORAL | 0 refills | Status: DC | PRN

## 2016-06-15 MED FILL — PROMETHAZINE-DM SYRUP: 6.25-15 | 6 days supply | Qty: 118 | Fill #0

## 2016-06-15 NOTE — Telephone Encounter (Signed)
error:[id number] ° °

## 2016-06-15 NOTE — Telephone Encounter (Signed)
Spoke with pt, pt gave verbal permission to speak with his wife Russell Brown about his result. Pt states she understand results, medication instructions, and follow up appointment if pt does not get better. Rx sent to pharmacy. Pt's wife state she will call to make an appointment and to update if pt does not feel better by Thursday or Friday. Pt had no further questions or concerns. LB

## 2016-06-15 NOTE — Progress Notes (Signed)
Med called in

## 2016-06-15 NOTE — Telephone Encounter (Signed)
Patient wife notified and will come in now for CXR.  CXR ordered.

## 2016-06-15 NOTE — Telephone Encounter (Signed)
-----   Message from Ann Held, [place] sent at 06/15/2016  3:51 PM EST ----- + pneumonia----   He's been on z pak--- levaquin 500 mg 1 po qd x 7 days --- f/u Thursday or Friday if no better

## 2016-06-15 NOTE — Telephone Encounter (Signed)
Caller name:Cheryl Relation to pt: spouse Call back number: [phone] Pharmacy: [facility] in [place]  Reason for call: Pt's spouse states pt came in on Friday 06-11-16 and saw Dr Nani Ravens for coughing, pt was giving rx of azithromycin (ZITHROMAX) 250 MG tablet and pt's wife states pt is still not doing well, that pt is coughing worst and is throwing up from all the coughing and not feeling well at all. Pt's wife states if pt can get another rx that will help with the coughing. Please advise ASAP.

## 2016-06-15 NOTE — Telephone Encounter (Signed)
I;ll send cough med to pharmacy Can pt get cxr today?  Order wherever is closest for pt ---here or [facility]

## 2016-06-16 ENCOUNTER — Telehealth: Payer: Self-pay

## 2016-06-16 NOTE — Telephone Encounter (Signed)
[facility] pharmacy has faxed drug change request for Qvar 40 mcg inhaler 120 doses. Inhale 2 puffs into the lungs twice daily. Insurance wants alternative. Pt was originally prescribed Flovent. Please advise.  TL/CMA

## 2016-06-16 NOTE — Telephone Encounter (Signed)
Ok to try Pulmicort 90 mcg, 1 puff twice daily unless they have a particular replacement in mind.

## 2016-06-22 NOTE — Telephone Encounter (Signed)
I have called pharmacy and they have informed me patient already picked up original Rx and no need to send in another Rx. TL/CMA

## 2016-06-24 ENCOUNTER — Ambulatory Visit (INDEPENDENT_AMBULATORY_CARE_PROVIDER_SITE_OTHER): Payer: Medicare Other | Admitting: Adult Health

## 2016-06-24 ENCOUNTER — Encounter: Payer: Self-pay | Admitting: Adult Health

## 2016-06-24 ENCOUNTER — Ambulatory Visit (HOSPITAL_BASED_OUTPATIENT_CLINIC_OR_DEPARTMENT_OTHER)
Admission: RE | Admit: 2016-06-24 | Discharge: 2016-06-24 | Disposition: A | Discharge status: Home / Self Care | Payer: Medicare Other | Source: Ambulatory Visit | Attending: Adult Health | Admitting: Adult Health

## 2016-06-24 VITALS — BP 137/76 | HR 83 | Temp 98.1°F | Ht 73.0 in | Wt 187.0 lb

## 2016-06-24 DIAGNOSIS — J42 Unspecified chronic bronchitis: Secondary | ICD-10-CM | POA: Insufficient documentation

## 2016-06-24 DIAGNOSIS — J449 Chronic obstructive pulmonary disease, unspecified: Secondary | ICD-10-CM | POA: Diagnosis not present

## 2016-06-24 DIAGNOSIS — J189 Pneumonia, unspecified organism: Secondary | ICD-10-CM

## 2016-06-24 DIAGNOSIS — J984 Other disorders of lung: Secondary | ICD-10-CM | POA: Diagnosis not present

## 2016-06-24 MED ORDER — LEVALBUTEROL HCL 0.63 MG/3ML IN NEBU
0.6300 mg | INHALATION_SOLUTION | Freq: Once | RESPIRATORY_TRACT | Status: AC
  Administered 2016-06-24: 0.63 mg via RESPIRATORY_TRACT

## 2016-06-24 MED ORDER — PREDNISONE 10 MG PO TABS: ORAL_TABLET | ORAL | 0 refills | Status: DC

## 2016-06-24 MED ORDER — BENZONATATE 200 MG PO CAPS: 200.0000 mg | ORAL_CAPSULE | Freq: Three times a day (TID) | ORAL | 1 refills | Status: DC | PRN

## 2016-06-24 MED FILL — BENZONATATE 200 MG CAPSULE: 200 | 10 days supply | Qty: 30 | Fill #0

## 2016-06-24 MED FILL — predniSONE 10 MG TABS: 10 | 8 days supply | Qty: 20 | Fill #0

## 2016-06-24 NOTE — Addendum Note (Signed)
Addended by: Len Blalock on: 06/24/2016 10:42 AM   Modules accepted: Orders

## 2016-06-24 NOTE — Progress Notes (Signed)
@Patient  ID: Russell Brown, male    DOB: 04/14/1946, 71 y.o.   MRN: [id number]  Chief Complaint  Patient presents with  . Acute Visit    Referring provider: Ann Held, *  HPI: 71 yo male former smoker seen for pulmonary consult 02/19/16 for COPD w/ Asthma [employer], structural  TEST  Spirometry 02/19/16 Moderate COPD with FEV1 56%, ratio 57, FVC 73% CXR 02/19/16 showed coarse interstitial markings.  FENO 21ppm  06/24/2016 Acute OV : COPD/PNA  Pt presents for an acute office visit. He complains over last 4 week he has had bronchitis with initial sore throat , drainage that turned into a cough that has progressively worsened with thick brown mucus and increased DOE.  He was seen by PCP 12/22 started on Zpack and flovent . He did not see much improvement and went for CXR on 12/26 that showed a lingular PNA.  He was started on levaquin 500mg  daily for 1 week. He returns today with persistent cough . Congestion is now clear but still thick. He feels weak and tired.  He denies fever, body aches , hemoptysis or edema .  He remains on BREO for his COPD/Ashtma .  Appetite is down but eating . No nv/d.   Has HTN , on ACE inhibitor . Denies chronic cough until this illness.   PVX and PRevanr utd.    Allergies  Allergen Reactions  . Codeine Anxiety    Immunization History  Administered Date(s) Administered  . H1N1 05/28/2008  . Influenza Whole 04/28/2007, 03/26/2008, 04/16/2009, 05/07/2010, 04/10/2011, 04/13/2012  . Influenza, High Dose Seasonal PF 03/05/2014, 03/17/2015  . Influenza, Seasonal, Injecte, Preservative Fre 03/06/2013  . Influenza,inj,Quad PF,36+ Mos 03/04/2016  . Influenza-Unspecified 03/22/2011  . Pneumococcal Conjugate-13 12/05/2014  . Pneumococcal Polysaccharide-23 04/11/2008, 04/18/2013  . Td 05/22/2009  . Zoster 05/22/2009    Past Medical History:  Diagnosis Date  . Asthma   . GERD (gastroesophageal reflux disease)   . Hyperlipemia   .  Hypertension   . Thyroid disease     Tobacco History: History  Smoking Status  . Former Smoker  . Packs/day: 1.00  . Years: 35.00  . Quit date: 08/18/1996  Smokeless Tobacco  . Never Used   Counseling given: Not Answered   Outpatient Encounter Prescriptions as of 06/24/2016  Medication Sig  . albuterol (PROAIR HFA) 108 (90 Base) MCG/ACT inhaler Inhale 2 puffs into the lungs every 6 (six) hours as needed for wheezing or shortness of breath.  [name] Kitchen aspirin (ASPIRIN EC) 81 MG EC tablet Take 81 mg by mouth daily.    [name] Kitchen atorvastatin (LIPITOR) 10 MG tablet Take 1 tablet by mouth daily at 6 PM.  . buPROPion (WELLBUTRIN XL) 150 MG 24 hr tablet TAKE 1 TABLET BY MOUTH  DAILY  . dutasteride (AVODART) 0.5 MG capsule Take 0.5 mg by mouth daily.  . fish oil-omega-3 fatty acids 1000 MG capsule Take 1 g by mouth daily.   . fluticasone (FLOVENT HFA) 110 MCG/ACT inhaler Inhale 2 puffs into the lungs 2 (two) times daily. Only when in yellow zone.  . fluticasone furoate-vilanterol (BREO ELLIPTA) 200-25 MCG/INH AEPB Inhale 1 puff into the lungs daily.  [name] Kitchen ipratropium (ATROVENT) 0.03 % nasal spray Place 2 sprays into both nostrils 3 (three) times daily as needed for rhinitis.  [name] Kitchen levothyroxine (SYNTHROID, LEVOTHROID) 75 MCG tablet Take 1 tablet by mouth  daily before breakfast  . lisinopril-hydrochlorothiazide (PRINZIDE,ZESTORETIC) 10-12.5 MG tablet TAKE 1 TABLET BY MOUTH  DAILY  .  Multiple Vitamin (MULTIVITAMIN) tablet Take 1 tablet by mouth daily.  [name] Kitchen omeprazole (PRILOSEC) 20 MG capsule TAKE 1 CAPSULE BY MOUTH  DAILY  . sildenafil (REVATIO) 20 MG tablet 5 po qd prn  . vitamin C (ASCORBIC ACID) 500 MG tablet Take 500 mg by mouth daily.  . vitamin E 400 UNIT capsule Take 400 Units by mouth daily.  . Calcium Carbonate-Vitamin D (CALCIUM 600/VITAMIN D) 600-400 MG-UNIT per chew tablet Chew 1 tablet by mouth daily.  . [DISCONTINUED] azithromycin (ZITHROMAX) 250 MG tablet Take 2 tabs the first day and then 1 tab daily  until you run out. (Patient not taking: Reported on 06/24/2016)  . [DISCONTINUED] levofloxacin (LEVAQUIN) 500 MG tablet Take 1 tablet (500 mg total) by mouth daily. (Patient not taking: Reported on 06/24/2016)  . [DISCONTINUED] promethazine-dextromethorphan (PROMETHAZINE-DM) 6.25-15 MG/5ML syrup Take 5 mLs by mouth 4 (four) times daily as needed. (Patient not taking: Reported on 06/24/2016)   No facility-administered encounter medications on file as of 06/24/2016.      Review of Systems  Constitutional:   No  weight loss, night sweats,  Fevers, chills, + fatigue, or  lassitude.  HEENT:   No headaches,  Difficulty swallowing,  Tooth/dental problems, or  Sore throat,                No sneezing, itching, ear ache,  +nasal congestion, post nasal drip,   CV:  No chest pain,  Orthopnea, PND, swelling in lower extremities, anasarca, dizziness, palpitations, syncope.   GI  No heartburn, indigestion, abdominal pain, nausea, vomiting, diarrhea, change in bowel habits, loss of appetite, bloody stools.   Resp:    No chest wall deformity  Skin: no rash or lesions.  GU: no dysuria, change in color of urine, no urgency or frequency.  No flank pain, no hematuria   MS:  No joint pain or swelling.  No decreased range of motion.  No back pain.    Physical Exam  BP 137/76 (BP Location: Right Arm, Cuff Size: Normal)   Pulse 83   Temp 98.1 F (36.7 C) (Oral)   Ht 6\' 1"  (1.854 m)   Wt 187 lb (84.8 kg)   SpO2 98%   BMI 24.67 kg/m   GEN: A/Ox3; pleasant , NAD, elderly , barking cough .    HEENT:  [place]/AT,  EACs-clear, TMs-wnl, NOSE-clear, THROAT-clear, no lesions, no postnasal drip or exudate noted.   NECK:  Supple w/ fair ROM; no JVD; normal carotid impulses w/o bruits; no thyromegaly or nodules palpated; no lymphadenopathy.    RESP  Few rhonchi w/ no wheezing  no accessory muscle use, no dullness to percussion  CARD:  RRR, no m/r/g, no peripheral edema, pulses intact, no cyanosis or  clubbing.  GI:   Soft & nt; nml bowel sounds; no organomegaly or masses detected.   Musco: Warm bil, no deformities or joint swelling noted.   Neuro: alert, no focal deficits noted.    Skin: Warm, no lesions or rashes  Psych:  No change in mood or affect. No depression or anxiety.  No memory loss.  Lab Results:  CBC    Component Value Date/Time   WBC 8.2 01/06/2016 0825   RBC 5.33 01/06/2016 0825   HGB 15.9 01/06/2016 0825   HCT 47.3 01/06/2016 0825   PLT 251.0 01/06/2016 0825   MCV 88.8 01/06/2016 0825   MCH 29.8 02/20/2012 1257   MCHC 33.6 01/06/2016 0825   RDW 13.8 01/06/2016 0825   LYMPHSABS 1.8 01/06/2016  0825   MONOABS 0.7 01/06/2016 0825   EOSABS 0.2 01/06/2016 0825   BASOSABS 0.0 01/06/2016 0825    BMET    Component Value Date/Time   NA 139 01/06/2016 0825   K 4.3 01/06/2016 0825   CL 102 01/06/2016 0825   CO2 29 01/06/2016 0825   GLUCOSE 99 01/06/2016 0825   GLUCOSE 98 04/22/2006 1057   BUN 15 01/06/2016 0825   CREATININE 1.08 01/06/2016 0825   CALCIUM 9.2 01/06/2016 0825   GFRNONAA 87 (L) 02/20/2012 1257   GFRAA >90 02/20/2012 1257    BNP No results found for: BNP  ProBNP    Component Value Date/Time   PROBNP 91.2 02/20/2012 1257    Imaging: Dg Chest 2 View  Result Date: 06/15/2016 CLINICAL DATA:  Cough EXAM: CHEST  2 VIEW COMPARISON:  02/19/2016 FINDINGS: Normal heart size. There is no pleural effusion identified. Diffuse chronic interstitial coarsening is identified throughout both lungs. Superimposed post airspace opacity within the lingular portion of the left lung is identified, worrisome for pneumonia. IMPRESSION: 1. Lingular opacity worrisome for pneumonia. 2. Advanced chronic lung disease. Electronically Signed   By: Kerby Moors M.D.   On: 06/15/2016 15:18   Reviewed independently and agree with findings of chronic changes w/ resolution of previous lingular opacity /PNA.    Assessment & Plan:   No problem-specific Assessment &  Plan notes found for this encounter.     Rexene Edison, NP 06/24/2016

## 2016-06-24 NOTE — Assessment & Plan Note (Signed)
Lingular PNA slowly improving but with COPD flare  CXR shows clearance . No further abx at this time   Plan  Patient Instructions  Prednisone taper over next week.  Mucinex Twice daily  As needed  Cough/congestion  Delsym 2 tsp Twice daily  As needed  Cough.  Stop Flovent  Continue on BREO 1 puff daily  If cough persists then call Primary MD that Lisinopril could be aggravating your cough .  Tessalon Perles Three times a day  As needed  Cough .  Advance activity as tolerated  Fluids and good nutrition .  Follow up Dr. Elsworth Soho  In 6 -8 weeks and As needed   Please contact office for sooner follow up if symptoms do not improve or worsen or seek emergency care

## 2016-06-24 NOTE — Patient Instructions (Addendum)
Prednisone taper over next week.  Mucinex Twice daily  As needed  Cough/congestion  Delsym 2 tsp Twice daily  As needed  Cough.  Stop Flovent  Continue on BREO 1 puff daily  If cough persists then call Primary MD that Lisinopril could be aggravating your cough .  Tessalon Perles Three times a day  As needed  Cough .  Advance activity as tolerated  Fluids and good nutrition .  Follow up Dr. Elsworth Soho  In 6 -8 weeks and As needed   Please contact office for sooner follow up if symptoms do not improve or worsen or seek emergency care

## 2016-06-24 NOTE — Assessment & Plan Note (Addendum)
Flare with recent PNA  xopenex neb x 1  If cough persists may need to change ACE .  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    Plan  Patient Instructions  Prednisone taper over next week.  Mucinex Twice daily  As needed  Cough/congestion  Delsym 2 tsp Twice daily  As needed  Cough.  Stop Flovent  Continue on BREO 1 puff daily  If cough persists then call Primary MD that Lisinopril could be aggravating your cough .  Tessalon Perles Three times a day  As needed  Cough .  Advance activity as tolerated  Fluids and good nutrition .  Follow up Dr. Elsworth Soho  In 6 -8 weeks and As needed   Please contact office for sooner follow up if symptoms do not improve or worsen or seek emergency care

## 2016-06-26 NOTE — Progress Notes (Signed)
Reviewed & agree with plan  

## 2016-07-02 ENCOUNTER — Ambulatory Visit (INDEPENDENT_AMBULATORY_CARE_PROVIDER_SITE_OTHER): Payer: Medicare Other | Admitting: Family Medicine

## 2016-07-02 ENCOUNTER — Encounter: Payer: Self-pay | Admitting: Family Medicine

## 2016-07-02 VITALS — BP 126/76 | HR 97 | Temp 98.5°F | Resp 16 | Ht 73.0 in | Wt 183.8 lb

## 2016-07-02 DIAGNOSIS — E785 Hyperlipidemia, unspecified: Secondary | ICD-10-CM | POA: Diagnosis not present

## 2016-07-02 DIAGNOSIS — I10 Essential (primary) hypertension: Secondary | ICD-10-CM

## 2016-07-02 DIAGNOSIS — E039 Hypothyroidism, unspecified: Secondary | ICD-10-CM

## 2016-07-02 DIAGNOSIS — F329 Major depressive disorder, single episode, unspecified: Secondary | ICD-10-CM | POA: Diagnosis not present

## 2016-07-02 DIAGNOSIS — F32A Depression, unspecified: Secondary | ICD-10-CM

## 2016-07-02 LAB — COMPREHENSIVE METABOLIC PANEL
ALK PHOS: 73 U/L (ref 39–117)
ALT: 32 U/L (ref 0–53)
AST: 17 U/L (ref 0–37)
Albumin: 4 g/dL (ref 3.5–5.2)
BILIRUBIN TOTAL: 0.5 mg/dL (ref 0.2–1.2)
BUN: 19 mg/dL (ref 6–23)
CALCIUM: 8.9 mg/dL (ref 8.4–10.5)
CHLORIDE: 102 meq/L (ref 96–112)
CO2: 29 mEq/L (ref 19–32)
CREATININE: 1.01 mg/dL (ref 0.40–1.50)
GFR: 77.49 mL/min (ref >60.00)
Glucose, Bld: 95 mg/dL (ref 70–99)
Potassium: 4 mEq/L (ref 3.5–5.1)
Sodium: 139 mEq/L (ref 135–145)
Total Protein: 6.5 g/dL (ref 6.0–8.3)

## 2016-07-02 LAB — CBC
HCT: 47.8 % (ref 39.0–52.0)
Hemoglobin: 16.2 g/dL (ref 13.0–17.0)
MCHC: 33.8 g/dL (ref 30.0–36.0)
MCV: 87.6 fl (ref 78.0–100.0)
Platelets: 264 10*3/uL (ref 150.0–400.0)
RBC: 5.46 Mil/uL (ref 4.22–5.81)
RDW: 13.8 % (ref 11.5–15.5)
WBC: 10.9 10*3/uL — AB (ref 4.0–10.5)

## 2016-07-02 LAB — LIPID PANEL
Cholesterol: 166 mg/dL (ref 0–200)
HDL: 53.6 mg/dL (ref >39.00)
LDL CALC: 91 mg/dL (ref 0–99)
NonHDL: 112.67
TRIGLYCERIDES: 109 mg/dL (ref 0.0–149.0)
Total CHOL/HDL Ratio: 3
VLDL: 21.8 mg/dL (ref 0.0–40.0)

## 2016-07-02 LAB — TSH: TSH: 3.66 u[IU]/mL (ref 0.35–4.50)

## 2016-07-02 MED ORDER — BUPROPION HCL ER (XL) 150 MG PO TB24: 150.0000 mg | ORAL_TABLET | Freq: Every day | ORAL | 0 refills | Status: DC

## 2016-07-02 MED ORDER — LEVOTHYROXINE SODIUM 75 MCG PO TABS: ORAL_TABLET | ORAL | 1 refills | Status: DC

## 2016-07-02 MED ORDER — LISINOPRIL-HYDROCHLOROTHIAZIDE 10-12.5 MG PO TABS: 1.0000 | ORAL_TABLET | Freq: Every day | ORAL | 0 refills | Status: DC

## 2016-07-02 MED ORDER — ATORVASTATIN CALCIUM 10 MG PO TABS: ORAL_TABLET | ORAL | 1 refills | Status: DC

## 2016-07-02 NOTE — Progress Notes (Signed)
Pre visit review using our clinic review tool, if applicable. No additional management support is needed unless otherwise documented below in the visit note. 

## 2016-07-02 NOTE — Progress Notes (Signed)
Patient ID: Russell Brown, male    DOB: 02/11/1946  Age: 71 y.o. MRN: [id number]    Subjective:  Subjective  HPI Russell Brown presents for f/u thyroid, bp and cholesterol-- he is also recovering from pneumonia and just finished the prednisone which he thought  He might be having a reaction but it is better now.   Review of Systems  Constitutional: Negative for appetite change, diaphoresis, fatigue and unexpected weight change.  Eyes: Negative for pain, redness and visual disturbance.  Respiratory: Negative for cough, chest tightness, shortness of breath and wheezing.   Cardiovascular: Negative for chest pain, palpitations and leg swelling.  Endocrine: Negative for cold intolerance, heat intolerance, polydipsia, polyphagia and polyuria.  Genitourinary: Negative for difficulty urinating, dysuria and frequency.  Neurological: Negative for dizziness, light-headedness, numbness and headaches.    History Past Medical History:  Diagnosis Date  . Asthma   . GERD (gastroesophageal reflux disease)   . Hyperlipemia   . Hypertension   . Thyroid disease     He has a past surgical history that includes Stomach surgery; Hernia repair; Appendectomy; Abdominal surgery; Back surgery; Foot Amputation; Hand amputation; Thyroidectomy; Nissen fundoplication; Septoplasty; and Cervical spine surgery.   His family history includes Alzheimer's disease in his paternal uncle; Asthma in his son; Cancer in his father and maternal grandmother; Heart disease in his paternal grandfather and paternal grandmother; Hyperlipidemia in his father; Hypertension in his father; Osteoporosis in his maternal grandmother, mother, and paternal aunt; Parkinsonism in his mother.He reports that he quit smoking about 19 years ago. He has a 35.00 pack-year smoking history. He has never used smokeless tobacco. He reports that he drinks about 7.2 oz of alcohol per week . He reports that he does not use drugs.  Current Outpatient  Prescriptions on File Prior to Visit  Medication Sig Dispense Refill  . albuterol (PROAIR HFA) 108 (90 Base) MCG/ACT inhaler Inhale 2 puffs into the lungs every 6 (six) hours as needed for wheezing or shortness of breath. 1 Inhaler 3  . aspirin (ASPIRIN EC) 81 MG EC tablet Take 81 mg by mouth daily.      [name] Kitchen dutasteride (AVODART) 0.5 MG capsule Take 0.5 mg by mouth daily.    . fish oil-omega-3 fatty acids 1000 MG capsule Take 1 g by mouth daily.     . fluticasone furoate-vilanterol (BREO ELLIPTA) 200-25 MCG/INH AEPB Inhale 1 puff into the lungs daily. 60 each 5  . ipratropium (ATROVENT) 0.03 % nasal spray Place 2 sprays into both nostrils 3 (three) times daily as needed for rhinitis. 90 mL 3  . Multiple Vitamin (MULTIVITAMIN) tablet Take 1 tablet by mouth daily.    [name] Kitchen omeprazole (PRILOSEC) 20 MG capsule TAKE 1 CAPSULE BY MOUTH  DAILY 90 capsule 0  . sildenafil (REVATIO) 20 MG tablet 5 po qd prn 150 tablet 0  . vitamin C (ASCORBIC ACID) 500 MG tablet Take 500 mg by mouth daily.    . vitamin E 400 UNIT capsule Take 400 Units by mouth daily.    . Calcium Carbonate-Vitamin D (CALCIUM 600/VITAMIN D) 600-400 MG-UNIT per chew tablet Chew 1 tablet by mouth daily.     No current facility-administered medications on file prior to visit.      Objective:  Objective  Physical Exam  Constitutional: He is oriented to person, place, and time. Vital signs are normal. He appears well-developed and well-nourished. He is sleeping.  HENT:  Head: Normocephalic and atraumatic.  Mouth/Throat: Oropharynx is clear and moist.  Eyes: EOM are normal. Pupils are equal, round, and reactive to light.  Neck: Normal range of motion. Neck supple. No thyromegaly present.  Cardiovascular: Normal rate and regular rhythm.   No murmur heard. Pulmonary/Chest: Effort normal and breath sounds normal. No respiratory distress. He has no wheezes. He has no rales. He exhibits no tenderness.  Musculoskeletal: He exhibits no edema or  tenderness.  Neurological: He is alert and oriented to person, place, and time.  Skin: Skin is warm and dry.  Psychiatric: He has a normal mood and affect. His behavior is normal. Judgment and thought content normal.  Nursing note and vitals reviewed.  BP 126/76 (BP Location: Left Arm, Cuff Size: Normal)   Pulse 97   Temp 98.5 F (36.9 C) (Oral)   Resp 16   Ht 6\' 1"  (1.854 m)   Wt 183 lb 12.8 oz (83.4 kg)   SpO2 96%   BMI 24.25 kg/m  Wt Readings from Last 3 Encounters:  07/02/16 183 lb 12.8 oz (83.4 kg)  06/24/16 187 lb (84.8 kg)  06/11/16 188 lb 6.4 oz (85.5 kg)     Lab Results  Component Value Date   WBC 10.9 (H) 07/02/2016   HGB 16.2 07/02/2016   HCT 47.8 07/02/2016   PLT 264.0 07/02/2016   GLUCOSE 95 07/02/2016   CHOL 166 07/02/2016   TRIG 109.0 07/02/2016   HDL 53.60 07/02/2016   LDLDIRECT 105.5 08/19/2011   LDLCALC 91 07/02/2016   ALT 32 07/02/2016   AST 17 07/02/2016   NA 139 07/02/2016   K 4.0 07/02/2016   CL 102 07/02/2016   CREATININE 1.01 07/02/2016   BUN 19 07/02/2016   CO2 29 07/02/2016   TSH 3.66 07/02/2016   PSA 6.92 (H) 05/20/2008   MICROALBUR <0.7 12/05/2014    Dg Chest 2 View  Result Date: 06/24/2016 CLINICAL DATA:  Cough and congestion with recent pneumonia EXAM: CHEST  2 VIEW COMPARISON:  June 15, 2016 and February 19, 2016 FINDINGS: There are scattered areas of scarring in both mid and lower lung zones. There is chronic central peribronchial thickening. The previous subtle opacity in the lingula is not appreciable currently, although there is scarring in this area. No new opacity is evident. The heart size and pulmonary vascularity are normal. No adenopathy. No bone lesions evident. There are surgical clips in the upper abdomen as well as at the right medial cervical-thoracic junction. IMPRESSION: Areas of scarring. No edema or consolidation currently apparent. Evidence of a degree of chronic bronchitis. Stable cardiac silhouette. Electronically  Signed   By: Lowella Grip III M.D.   On: 06/24/2016 10:06     Assessment & Plan:  Plan  I have discontinued Mr. Rivers's predniSONE and benzonatate. I have also changed his buPROPion and lisinopril-hydrochlorothiazide. Additionally, I am having him maintain his aspirin, dutasteride, fish oil-omega-3 fatty acids, vitamin C, Calcium Carbonate-Vitamin D, vitamin E, multivitamin, albuterol, ipratropium, sildenafil, omeprazole, fluticasone furoate-vilanterol, atorvastatin, and levothyroxine.  Meds ordered this encounter  Medications  . atorvastatin (LIPITOR) 10 MG tablet    Sig: Take 1 tablet by mouth daily at 6 PM.    Dispense:  90 tablet    Refill:  1  . buPROPion (WELLBUTRIN XL) 150 MG 24 hr tablet    Sig: Take 1 tablet (150 mg total) by mouth daily.    Dispense:  90 tablet    Refill:  0  . levothyroxine (SYNTHROID, LEVOTHROID) 75 MCG tablet    Sig: Take 1 tablet by mouth  daily before breakfast    Dispense:  90 tablet    Refill:  1  . lisinopril-hydrochlorothiazide (PRINZIDE,ZESTORETIC) 10-12.5 MG tablet    Sig: Take 1 tablet by mouth daily.    Dispense:  90 tablet    Refill:  0    Problem List Items Addressed This Visit      Unprioritized   Essential hypertension - Primary    Stable con't meds      Relevant Medications   atorvastatin (LIPITOR) 10 MG tablet   lisinopril-hydrochlorothiazide (PRINZIDE,ZESTORETIC) 10-12.5 MG tablet   Other Relevant Orders   Comprehensive metabolic panel (Completed)   CBC (Completed)    Other Visit Diagnoses    Hyperlipidemia, unspecified hyperlipidemia type       Relevant Medications   atorvastatin (LIPITOR) 10 MG tablet   lisinopril-hydrochlorothiazide (PRINZIDE,ZESTORETIC) 10-12.5 MG tablet   Other Relevant Orders   Lipid panel (Completed)   CBC (Completed)   Hypothyroidism, unspecified type       Relevant Medications   levothyroxine (SYNTHROID, LEVOTHROID) 75 MCG tablet   Other Relevant Orders   TSH (Completed)    Hyperlipidemia LDL goal <100       Relevant Medications   atorvastatin (LIPITOR) 10 MG tablet   lisinopril-hydrochlorothiazide (PRINZIDE,ZESTORETIC) 10-12.5 MG tablet   Depression, unspecified depression type       Relevant Medications   buPROPion (WELLBUTRIN XL) 150 MG 24 hr tablet      Follow-up: Return in about 6 months (around 12/30/2016) for annual exam, fasting.  Ann Held, DO

## 2016-07-02 NOTE — Patient Instructions (Signed)
Hypertension Hypertension, commonly called high blood pressure, is when the force of blood pumping through your arteries is too strong. Your arteries are the blood vessels that carry blood from your heart throughout your body. A blood pressure reading consists of a higher number over a lower number, such as 110/72. The higher number (systolic) is the pressure inside your arteries when your heart pumps. The lower number (diastolic) is the pressure inside your arteries when your heart relaxes. Ideally you want your blood pressure below 120/80. Hypertension forces your heart to work harder to pump blood. Your arteries may become narrow or stiff. Having untreated or uncontrolled hypertension can cause heart attack, stroke, kidney disease, and other problems. What increases the risk? Some risk factors for high blood pressure are controllable. Others are not. Risk factors you cannot control include:  Race. You may be at higher risk if you are African American.  Age. Risk increases with age.  Gender. Men are at higher risk than women before age 45 years. After age 65, women are at higher risk than men. Risk factors you can control include:  Not getting enough exercise or physical activity.  Being overweight.  Getting too much fat, sugar, calories, or salt in your diet.  Drinking too much alcohol. What are the signs or symptoms? Hypertension does not usually cause signs or symptoms. Extremely high blood pressure (hypertensive crisis) may cause headache, anxiety, shortness of breath, and nosebleed. How is this diagnosed? To check if you have hypertension, your health care provider will measure your blood pressure while you are seated, with your arm held at the level of your heart. It should be measured at least twice using the same arm. Certain conditions can cause a difference in blood pressure between your right and left arms. A blood pressure reading that is higher than normal on one occasion does  not mean that you need treatment. If it is not clear whether you have high blood pressure, you may be asked to return on a different day to have your blood pressure checked again. Or, you may be asked to monitor your blood pressure at home for 1 or more weeks. How is this treated? Treating high blood pressure includes making lifestyle changes and possibly taking medicine. Living a healthy lifestyle can help lower high blood pressure. You may need to change some of your habits. Lifestyle changes may include:  Following the DASH diet. This diet is high in fruits, vegetables, and whole grains. It is low in salt, red meat, and added sugars.  Keep your sodium intake below 2,300 mg per day.  Getting at least 30-45 minutes of aerobic exercise at least 4 times per week.  Losing weight if necessary.  Not smoking.  Limiting alcoholic beverages.  Learning ways to reduce stress. Your health care provider may prescribe medicine if lifestyle changes are not enough to get your blood pressure under control, and if one of the following is true:  You are 18-59 years of age and your systolic blood pressure is above 140.  You are 60 years of age or older, and your systolic blood pressure is above 150.  Your diastolic blood pressure is above 90.  You have diabetes, and your systolic blood pressure is over 140 or your diastolic blood pressure is over 90.  You have kidney disease and your blood pressure is above 140/90.  You have heart disease and your blood pressure is above 140/90. Your personal target blood pressure may vary depending on your medical   conditions, your age, and other factors. Follow these instructions at home:  Have your blood pressure rechecked as directed by your health care provider.  Take medicines only as directed by your health care provider. Follow the directions carefully. Blood pressure medicines must be taken as prescribed. The medicine does not work as well when you skip  doses. Skipping doses also puts you at risk for problems.  Do not smoke.  Monitor your blood pressure at home as directed by your health care provider. Contact a health care provider if:  You think you are having a reaction to medicines taken.  You have recurrent headaches or feel dizzy.  You have swelling in your ankles.  You have trouble with your vision. Get help right away if:  You develop a severe headache or confusion.  You have unusual weakness, numbness, or feel faint.  You have severe chest or abdominal pain.  You vomit repeatedly.  You have trouble breathing. This information is not intended to replace advice given to you by your health care provider. Make sure you discuss any questions you have with your health care provider. Document Released: 06/07/2005 Document Revised: 11/13/2015 Document Reviewed: 03/30/2013 Elsevier Interactive Patient Education  2017 [employer]  

## 2016-07-04 NOTE — Assessment & Plan Note (Signed)
Stable con't meds 

## 2016-07-06 ENCOUNTER — Other Ambulatory Visit: Payer: Self-pay | Admitting: *Deleted

## 2016-07-06 DIAGNOSIS — E039 Hypothyroidism, unspecified: Secondary | ICD-10-CM

## 2016-07-06 DIAGNOSIS — F32A Depression, unspecified: Secondary | ICD-10-CM

## 2016-07-06 DIAGNOSIS — I10 Essential (primary) hypertension: Secondary | ICD-10-CM

## 2016-07-06 DIAGNOSIS — F329 Major depressive disorder, single episode, unspecified: Secondary | ICD-10-CM

## 2016-07-06 MED ORDER — LEVOTHYROXINE SODIUM 75 MCG PO TABS: ORAL_TABLET | ORAL | 1 refills | Status: DC

## 2016-07-06 MED ORDER — OMEPRAZOLE 20 MG PO CPDR: 20.0000 mg | DELAYED_RELEASE_CAPSULE | Freq: Every day | ORAL | 1 refills | Status: DC

## 2016-07-06 MED ORDER — BUPROPION HCL ER (XL) 150 MG PO TB24: 150.0000 mg | ORAL_TABLET | Freq: Every day | ORAL | 1 refills | Status: DC

## 2016-07-06 MED ORDER — LISINOPRIL-HYDROCHLOROTHIAZIDE 10-12.5 MG PO TABS: 1.0000 | ORAL_TABLET | Freq: Every day | ORAL | 1 refills | Status: DC

## 2016-07-06 NOTE — Telephone Encounter (Signed)
Rxs resent to [facility].  Most were sent as no print.

## 2016-08-12 ENCOUNTER — Encounter: Payer: Self-pay | Admitting: Pulmonary Disease

## 2016-08-12 ENCOUNTER — Ambulatory Visit (INDEPENDENT_AMBULATORY_CARE_PROVIDER_SITE_OTHER): Payer: Medicare Other | Admitting: Pulmonary Disease

## 2016-08-12 DIAGNOSIS — J189 Pneumonia, unspecified organism: Secondary | ICD-10-CM

## 2016-08-12 DIAGNOSIS — J181 Lobar pneumonia, unspecified organism: Secondary | ICD-10-CM | POA: Diagnosis not present

## 2016-08-12 DIAGNOSIS — J449 Chronic obstructive pulmonary disease, unspecified: Secondary | ICD-10-CM | POA: Diagnosis not present

## 2016-08-12 NOTE — Assessment & Plan Note (Signed)
Expect symptoms of dyspnea and chest pain to return to baseline in about 6 weeks, if not give [place] a call.  If shortness of breath is worse, can switch from [facility] to medication called Trelegy

## 2016-08-12 NOTE — Progress Notes (Signed)
   Subjective:    Patient ID: Russell Brown, male    DOB: [birth year], 71 y.o.   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 Follow-up    pt doing much better since last OV- does note some sob with exertion, chest tightness.     He was on Symbicort for many years before being changed to [facility] He was treated for lingular pneumonia in 06/2016 and feels improved his cough has completely subsided however he still complains of dyspnea on exertion which is worse than his baseline. He also reports substernal chest pain about 2-3 times a week, not related to exertion, nonradiating, not related to meals  His wife also no prognosis and whether he will return to baseline He is started back on his exercise program and is able to walk on the treadmill about 30 minutes 3 times a week   Medication list shows lisinopril but he denies cough or upper airway symptoms  He underwent cardiac cath 3 years ago and this was clean Significant tests/ events   Spirometry 02/19/16 Moderate COPD with FEV1 56%, ratio 57, FVC 73%   Review of Systems neg for any significant sore throat, dysphagia, itching, sneezing, nasal congestion or excess/ purulent secretions, fever, chills, sweats, unintended wt loss, pleuritic or exertional cp, hempoptysis, orthopnea pnd or change in chronic leg swelling. Also denies presyncope, palpitations, heartburn, abdominal pain, nausea, vomiting, diarrhea or change in bowel or urinary habits, dysuria,hematuria, rash, arthralgias, visual complaints, headache, numbness weakness or ataxia.     Objective:   Physical Exam  Gen. Pleasant, well-nourished, in no distress ENT - no lesions, no post nasal drip Neck: No JVD, no thyromegaly, no carotid bruits Lungs: no use of accessory muscles, no dullness to percussion, clear without rales or rhonchi  Cardiovascular: Rhythm regular,  heart sounds  normal, no murmurs or gallops, no peripheral edema Musculoskeletal: No deformities, no cyanosis or clubbing       Assessment & Plan:

## 2016-08-12 NOTE — Patient Instructions (Signed)
Expect symptoms of dyspnea and chest pain to return to baseline in about 6 weeks, if not give [place] a call.  If shortness of breath is worse, can switch from [facility] to medication called Trelegy  Chest x-ray shows  pneumonia has cleared

## 2016-08-12 NOTE — Assessment & Plan Note (Signed)
Chest x-ray shows  pneumonia has cleared

## 2016-09-02 ENCOUNTER — Other Ambulatory Visit: Payer: Self-pay | Admitting: Family Medicine

## 2016-09-02 DIAGNOSIS — E785 Hyperlipidemia, unspecified: Secondary | ICD-10-CM

## 2016-09-02 MED ORDER — ATORVASTATIN CALCIUM 10 MG PO TABS: ORAL_TABLET | ORAL | 1 refills | Status: DC

## 2016-09-03 ENCOUNTER — Other Ambulatory Visit: Payer: Self-pay

## 2016-09-03 ENCOUNTER — Other Ambulatory Visit: Payer: Self-pay | Admitting: Pulmonary Disease

## 2016-09-03 MED ORDER — FLUTICASONE FUROATE-VILANTEROL 200-25 MCG/INH IN AEPB: 1.0000 | INHALATION_SPRAY | Freq: Every day | RESPIRATORY_TRACT | 1 refills | Status: DC

## 2016-09-03 NOTE — Telephone Encounter (Signed)
**  RA patient, not BQ** Received fax from [facility] stating that patient would like to start filling Breo through said pharmacy Last ov 2.22.18 w/ RA Refills sent

## 2016-09-23 ENCOUNTER — Encounter: Payer: Self-pay | Admitting: Family Medicine

## 2016-09-29 ENCOUNTER — Telehealth: Payer: Self-pay | Admitting: Family Medicine

## 2016-09-29 NOTE — Telephone Encounter (Signed)
Scheduled 12/06/16

## 2016-09-29 NOTE — Telephone Encounter (Signed)
Left pt message asking to call Allison back directly at [phone] to schedule AWV. Thanks! °

## 2016-11-05 ENCOUNTER — Other Ambulatory Visit: Payer: Self-pay | Admitting: Family Medicine

## 2016-11-05 DIAGNOSIS — F32A Depression, unspecified: Secondary | ICD-10-CM

## 2016-11-05 DIAGNOSIS — F329 Major depressive disorder, single episode, unspecified: Secondary | ICD-10-CM

## 2016-11-05 DIAGNOSIS — I10 Essential (primary) hypertension: Secondary | ICD-10-CM

## 2016-11-10 ENCOUNTER — Other Ambulatory Visit: Payer: Self-pay | Admitting: Family Medicine

## 2016-11-10 DIAGNOSIS — E039 Hypothyroidism, unspecified: Secondary | ICD-10-CM

## 2016-12-02 ENCOUNTER — Encounter: Payer: Self-pay | Admitting: Adult Health

## 2016-12-02 ENCOUNTER — Ambulatory Visit (INDEPENDENT_AMBULATORY_CARE_PROVIDER_SITE_OTHER): Payer: Medicare Other | Admitting: Adult Health

## 2016-12-02 ENCOUNTER — Telehealth: Payer: Self-pay | Admitting: *Deleted

## 2016-12-02 DIAGNOSIS — J4540 Moderate persistent asthma, uncomplicated: Secondary | ICD-10-CM

## 2016-12-02 DIAGNOSIS — J449 Chronic obstructive pulmonary disease, unspecified: Secondary | ICD-10-CM | POA: Diagnosis not present

## 2016-12-02 MED ORDER — AZITHROMYCIN 250 MG PO TABS: ORAL_TABLET | ORAL | 0 refills | Status: DC

## 2016-12-02 MED FILL — AZITHROMYCIN 250 MG TABLET: 250 | 5 days supply | Qty: 6 | Fill #0

## 2016-12-02 NOTE — Patient Instructions (Signed)
Zpack take as directed.  Mucinex Twice daily  As needed  Cough/congestion  Delsym 2 tsp Twice daily  As needed  Cough.  Continue on BREO 1 puff daily  If cough persists then call Primary MD that Lisinopril could be aggravating your cough .  Tessalon Perles Three times a day  As needed  Cough .  Advance activity as tolerated  Follow up Dr. Elsworth Soho  In 4 months and As needed   Please contact office for sooner follow up if symptoms do not improve or worsen or seek emergency care

## 2016-12-02 NOTE — Telephone Encounter (Signed)
Unable to reach pt

## 2016-12-02 NOTE — Progress Notes (Signed)
@Patient  ID: Russell Brown, male    DOB: [birth year], 71 y.o.   MRN: [id number]  Chief Complaint  Patient presents with  . 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  FENO 21ppm  12/02/2016 Acute OV : COPD /cough  Patient presents for an acute office visit. He complains of a one-week history of cough, congestion. Throat clearing and intermittent wheezing. He denies any fever, chest pain, orthopnea, PND, hemoptysis, orthopnea, nausea, vomiting or diarrhea. He has been taking Mucinex DM with only minimal relief. He does have some Tessalon Perles at home but has not started these yet. He denies any recent travel or antibiotic use.[name] Kitchen  He has COPD with asthma and remains on BREO. He denies any increased use of albuterol  Of note, patient is on lisinopril but says he did not have a cough until the last week..   Allergies  Allergen Reactions  . Codeine Anxiety    Immunization History  Administered Date(s) Administered  . H1N1 05/28/2008  . Influenza Whole 04/28/2007, 03/26/2008, 04/16/2009, 05/07/2010, 04/10/2011, 04/13/2012  . Influenza, High Dose Seasonal PF 03/05/2014, 03/17/2015  . Influenza, Seasonal, Injecte, Preservative Fre 03/06/2013  . Influenza,inj,Quad PF,36+ Mos 03/04/2016  . Influenza-Unspecified 03/22/2011  . Pneumococcal Conjugate-13 12/05/2014  . Pneumococcal Polysaccharide-23 04/11/2008, 04/18/2013  . Td 05/22/2009  . Zoster 05/22/2009    Past Medical History:  Diagnosis Date  . Asthma   . GERD (gastroesophageal reflux disease)   . Hyperlipemia   . Hypertension   . Thyroid disease     Tobacco History: History  Smoking Status  . Former Smoker  . Packs/day: 1.00  . Years: 35.00  . Quit  date: 08/18/1996  Smokeless Tobacco  . Never Used   Counseling given: Not Answered   Outpatient Encounter Prescriptions as of 12/02/2016  Medication Sig  . albuterol (PROAIR HFA) 108 (90 Base) MCG/ACT inhaler Inhale 2 puffs into the lungs every 6 (six) hours as needed for wheezing or shortness of breath.  [name] Kitchen aspirin (ASPIRIN EC) 81 MG EC tablet Take 81 mg by mouth daily.    [name] Kitchen atorvastatin (LIPITOR) 10 MG tablet Take 1 tablet by mouth daily at 6 PM.  . buPROPion (WELLBUTRIN XL) 150 MG 24 hr tablet TAKE 1 TABLET BY MOUTH  DAILY  . dutasteride (AVODART) 0.5 MG capsule Take 0.5 mg by mouth daily.  . fish oil-omega-3 fatty acids 1000 MG capsule Take 1 g by mouth daily.   . fluticasone furoate-vilanterol (BREO ELLIPTA) 200-25 MCG/INH AEPB Inhale 1 puff into the lungs daily.  [name] Kitchen ipratropium (ATROVENT) 0.03 % nasal spray Place 2 sprays into both nostrils 3 (three) times daily as needed for rhinitis.  [name] Kitchen levothyroxine (SYNTHROID, LEVOTHROID) 75 MCG tablet TAKE 1 TABLET BY MOUTH  DAILY BEFORE BREAKFAST  . lisinopril-hydrochlorothiazide (PRINZIDE,ZESTORETIC) 10-12.5 MG tablet TAKE 1 TABLET BY MOUTH  DAILY  . Multiple Vitamin (MULTIVITAMIN) tablet Take 1 tablet by mouth daily.  [name] Kitchen omeprazole (PRILOSEC) 20 MG capsule TAKE 1 CAPSULE BY MOUTH  DAILY  . sildenafil (REVATIO) 20 MG tablet 5 po qd prn  . azithromycin (ZITHROMAX Z-PAK) 250 MG tablet Take 2 tablets (500 mg) on  Day 1,  followed by 1 tablet (250 mg) once daily on  Days 2 through 5.  . Calcium Carbonate-Vitamin D (CALCIUM 600/VITAMIN D) 600-400 MG-UNIT per chew tablet Chew 1 tablet by mouth daily.  . [DISCONTINUED] vitamin C (ASCORBIC ACID) 500 MG tablet Take 500 mg by mouth daily.  . [DISCONTINUED] vitamin E 400 UNIT capsule Take 400 Units by mouth daily.   No facility-administered encounter medications on file as of 12/02/2016.      Review of Systems  Constitutional:   No  weight loss, night sweats,  Fevers, chills, fatigue, or   lassitude.  HEENT:   No headaches,  Difficulty swallowing,  Tooth/dental problems, or  Sore throat,                No sneezing, itching, ear ache, nasal congestion, post nasal drip,   CV:  No chest pain,  Orthopnea, PND, swelling in lower extremities, anasarca, dizziness, palpitations, syncope.   GI  No heartburn, indigestion, abdominal pain, nausea, vomiting, diarrhea, change in bowel habits, loss of appetite, bloody stools.   Resp:   No chest wall deformity  Skin: no rash or lesions.  GU: no dysuria, change in color of urine, no urgency or frequency.  No flank pain, no hematuria   MS:  No joint pain or swelling.  No decreased range of motion.  No back pain.    Physical Exam  BP 128/78 (BP Location: Left Arm, Patient Position: Sitting, Cuff Size: Normal)   Pulse 73   Ht 6\' 1"  (1.854 m)   Wt 191 lb (86.6 kg)   SpO2 98%   BMI 25.20 kg/m   GEN: A/Ox3; pleasant , NAD, well nourished    HEENT:  [place]/AT,  EACs-clear, TMs-wnl, NOSE-clear, THROAT-clear, no lesions, no postnasal drip or exudate noted.   NECK:  Supple w/ fair ROM; no JVD; normal carotid impulses w/o bruits; no thyromegaly or nodules palpated; no lymphadenopathy.    RESP  Clear  P & A; w/o, wheezes/ rales/ or rhonchi. no accessory muscle use, no dullness to percussion  CARD:  RRR, no m/r/g, no peripheral edema, pulses intact, no cyanosis or clubbing.  GI:   Soft & nt; nml bowel sounds; no organomegaly or masses detected.   Musco: Warm bil, no deformities or joint swelling noted.   Neuro: alert, no focal deficits noted.    Skin: Warm, no lesions or rashes   Lab Results:  CBC    Component Value Date/Time   WBC 10.9 (H) 07/02/2016 0914   RBC 5.46 07/02/2016 0914   HGB 16.2 07/02/2016 0914   HCT 47.8 07/02/2016 0914   PLT 264.0 07/02/2016 0914   MCV 87.6 07/02/2016 0914   MCH 29.8 02/20/2012 1257   MCHC 33.8 07/02/2016 0914   RDW 13.8 07/02/2016 0914   LYMPHSABS 1.8 01/06/2016 0825   MONOABS 0.7  01/06/2016 0825   EOSABS 0.2 01/06/2016 0825   BASOSABS 0.0 01/06/2016 0825    BMET    Component Value Date/Time   NA 139 07/02/2016 0914   K 4.0 07/02/2016 0914   CL 102 07/02/2016 0914   CO2 29 07/02/2016 0914   GLUCOSE 95 07/02/2016 0914   GLUCOSE 98 04/22/2006 1057   BUN 19 07/02/2016 0914   CREATININE 1.01 07/02/2016 0914   CALCIUM 8.9 07/02/2016 0914   GFRNONAA 87 (L) 02/20/2012 1257   GFRAA >90 02/20/2012 1257    BNP No results found for: BNP  ProBNP    Component Value Date/Time   PROBNP 91.2 02/20/2012 1257    Imaging: No results found.  Assessment & Plan:   COPD with asthma ([place]) Exacerbation with URI/Bronchitis   Plan  Patient Instructions  Zpack take as directed.  Mucinex Twice daily  As needed  Cough/congestion  Delsym 2 tsp Twice daily  As needed  Cough.  Continue on BREO 1 puff daily  If cough persists then call Primary MD that Lisinopril could be aggravating your cough .  Tessalon Perles Three times a day  As needed  Cough .  Advance activity as tolerated  Follow up Dr. Elsworth Soho  In 4 months and As needed   Please contact office for sooner follow up if symptoms do not improve or worsen or seek emergency care       Asthma Appears stable , hold on steroids for now .  Cont on BREO  Watch ACE as cough may be aggravating with frequent flares.      Rexene Edison, NP 12/02/2016

## 2016-12-02 NOTE — Assessment & Plan Note (Signed)
Appears stable , hold on steroids for now .  Cont on BREO  Watch ACE as cough may be aggravating with frequent flares.

## 2016-12-02 NOTE — Assessment & Plan Note (Signed)
Exacerbation with URI/Bronchitis   Plan  Patient Instructions  Zpack take as directed.  Mucinex Twice daily  As needed  Cough/congestion  Delsym 2 tsp Twice daily  As needed  Cough.  Continue on BREO 1 puff daily  If cough persists then call Primary MD that Lisinopril could be aggravating your cough .  Tessalon Perles Three times a day  As needed  Cough .  Advance activity as tolerated  Follow up Dr. Elsworth Soho  In 4 months and As needed   Please contact office for sooner follow up if symptoms do not improve or worsen or seek emergency care

## 2016-12-04 NOTE — Progress Notes (Signed)
Reviewed & agree with plan  

## 2016-12-06 ENCOUNTER — Ambulatory Visit (HOSPITAL_BASED_OUTPATIENT_CLINIC_OR_DEPARTMENT_OTHER)
Admission: RE | Admit: 2016-12-06 | Discharge: 2016-12-06 | Disposition: A | Discharge status: Home / Self Care | Payer: Medicare Other | Source: Ambulatory Visit | Attending: Family Medicine | Admitting: Family Medicine

## 2016-12-06 ENCOUNTER — Ambulatory Visit (INDEPENDENT_AMBULATORY_CARE_PROVIDER_SITE_OTHER): Payer: Medicare Other | Admitting: Family Medicine

## 2016-12-06 ENCOUNTER — Encounter: Payer: Self-pay | Admitting: Family Medicine

## 2016-12-06 VITALS — BP 110/78 | HR 76 | Temp 98.5°F | Resp 16 | Ht 72.4 in | Wt 189.0 lb

## 2016-12-06 DIAGNOSIS — N400 Enlarged prostate without lower urinary tract symptoms: Secondary | ICD-10-CM

## 2016-12-06 DIAGNOSIS — Z1159 Encounter for screening for other viral diseases: Secondary | ICD-10-CM | POA: Diagnosis not present

## 2016-12-06 DIAGNOSIS — R05 Cough: Secondary | ICD-10-CM | POA: Insufficient documentation

## 2016-12-06 DIAGNOSIS — J209 Acute bronchitis, unspecified: Secondary | ICD-10-CM | POA: Diagnosis not present

## 2016-12-06 DIAGNOSIS — M25561 Pain in right knee: Secondary | ICD-10-CM

## 2016-12-06 DIAGNOSIS — Z0000 Encounter for general adult medical examination without abnormal findings: Secondary | ICD-10-CM | POA: Diagnosis not present

## 2016-12-06 DIAGNOSIS — R059 Cough, unspecified: Secondary | ICD-10-CM

## 2016-12-06 DIAGNOSIS — J439 Emphysema, unspecified: Secondary | ICD-10-CM | POA: Insufficient documentation

## 2016-12-06 DIAGNOSIS — E785 Hyperlipidemia, unspecified: Secondary | ICD-10-CM

## 2016-12-06 HISTORY — DX: Encounter for general adult medical examination without abnormal findings: Z00.00

## 2016-12-06 LAB — COMPREHENSIVE METABOLIC PANEL
ALT: 25 U/L (ref 0–53)
AST: 19 U/L (ref 0–37)
Albumin: 4.4 g/dL (ref 3.5–5.2)
Alkaline Phosphatase: 81 U/L (ref 39–117)
BILIRUBIN TOTAL: 0.7 mg/dL (ref 0.2–1.2)
BUN: 17 mg/dL (ref 6–23)
CALCIUM: 9.4 mg/dL (ref 8.4–10.5)
CO2: 30 meq/L (ref 19–32)
Chloride: 103 mEq/L (ref 96–112)
Creatinine, Ser: 1.09 mg/dL (ref 0.40–1.50)
GFR: 70.88 mL/min (ref >60.00)
Glucose, Bld: 107 mg/dL — ABNORMAL HIGH (ref 70–99)
Potassium: 4.6 mEq/L (ref 3.5–5.1)
Sodium: 139 mEq/L (ref 135–145)
Total Protein: 6.9 g/dL (ref 6.0–8.3)

## 2016-12-06 LAB — CBC WITH DIFFERENTIAL/PLATELET
BASOS ABS: 0 10*3/uL (ref 0.0–0.1)
BASOS PCT: 0.5 % (ref 0.0–3.0)
EOS PCT: 2.5 % (ref 0.0–5.0)
Eosinophils Absolute: 0.2 10*3/uL (ref 0.0–0.7)
HEMATOCRIT: 48 % (ref 39.0–52.0)
Hemoglobin: 16 g/dL (ref 13.0–17.0)
LYMPHS PCT: 18.2 % (ref 12.0–46.0)
Lymphs Abs: 1.3 10*3/uL (ref 0.7–4.0)
MCHC: 33.4 g/dL (ref 30.0–36.0)
MCV: 89.8 fl (ref 78.0–100.0)
MONOS PCT: 8.2 % (ref 3.0–12.0)
Monocytes Absolute: 0.6 10*3/uL (ref 0.1–1.0)
NEUTROS ABS: 5.1 10*3/uL (ref 1.4–7.7)
Neutrophils Relative %: 70.6 % (ref 43.0–77.0)
PLATELETS: 260 10*3/uL (ref 150.0–400.0)
RBC: 5.35 Mil/uL (ref 4.22–5.81)
RDW: 13.6 % (ref 11.5–15.5)
WBC: 7.2 10*3/uL (ref 4.0–10.5)

## 2016-12-06 LAB — POC URINALSYSI DIPSTICK (AUTOMATED)
BILIRUBIN UA: NEGATIVE
Blood, UA: NEGATIVE
GLUCOSE UA: NEGATIVE
Ketones, UA: NEGATIVE
LEUKOCYTES UA: NEGATIVE
NITRITE UA: NEGATIVE
Protein, UA: NEGATIVE
Spec Grav, UA: 1.02 (ref 1.010–1.025)
Urobilinogen, UA: 0.2 E.U./dL
pH, UA: 6 (ref 5.0–8.0)

## 2016-12-06 LAB — HEPATITIS C ANTIBODY: HCV Ab: NEGATIVE

## 2016-12-06 LAB — LIPID PANEL
CHOL/HDL RATIO: 4
Cholesterol: 162 mg/dL (ref 0–200)
HDL: 43.6 mg/dL (ref >39.00)
LDL Cholesterol: 91 mg/dL (ref 0–99)
NonHDL: 118.4
TRIGLYCERIDES: 139 mg/dL (ref 0.0–149.0)
VLDL: 27.8 mg/dL (ref 0.0–40.0)

## 2016-12-06 MED ORDER — DOXYCYCLINE HYCLATE 100 MG PO TABS: 100.0000 mg | ORAL_TABLET | Freq: Every day | ORAL | 0 refills | Status: DC

## 2016-12-06 MED ORDER — PREDNISONE 10 MG PO TABS: ORAL_TABLET | ORAL | 0 refills | Status: DC

## 2016-12-06 MED FILL — predniSONE 10 MG TABS: 10 | 12 days supply | Qty: 20 | Fill #0

## 2016-12-06 NOTE — Patient Instructions (Signed)
Preventive Care 71 Years and Older, Male Preventive care refers to lifestyle choices and visits with your health care provider that can promote health and wellness. What does preventive care include?  A yearly physical exam. This is also called an annual well check.  Dental exams once or twice a year.  Routine eye exams. Ask your health care provider how often you should have your eyes checked.  Personal lifestyle choices, including: ? Daily care of your teeth and gums. ? Regular physical activity. ? Eating a healthy diet. ? Avoiding tobacco and drug use. ? Limiting alcohol use. ? Practicing safe sex. ? Taking low doses of aspirin every day. ? Taking vitamin and mineral supplements as recommended by your health care provider. What happens during an annual well check? The services and screenings done by your health care provider during your annual well check will depend on your age, overall health, lifestyle risk factors, and family history of disease. Counseling Your health care provider may ask you questions about your:  Alcohol use.  Tobacco use.  Drug use.  Emotional well-being.  Home and relationship well-being.  Sexual activity.  Eating habits.  History of falls.  Memory and ability to understand (cognition).  Work and work environment.  Screening You may have the following tests or measurements:  Height, weight, and BMI.  Blood pressure.  Lipid and cholesterol levels. These may be checked every 5 years, or more frequently if you are over 50 years old.  Skin check.  Lung cancer screening. You may have this screening every year starting at age 55 if you have a 30-pack-year history of smoking and currently smoke or have quit within the past 15 years.  Fecal occult blood test (FOBT) of the stool. You may have this test every year starting at age 50.  Flexible sigmoidoscopy or colonoscopy. You may have a sigmoidoscopy every 5 years or a colonoscopy every 10  years starting at age 50.  Prostate cancer screening. Recommendations will vary depending on your family history and other risks.  Hepatitis C blood test.  Hepatitis B blood test.  Sexually transmitted disease (STD) testing.  Diabetes screening. This is done by checking your blood sugar (glucose) after you have not eaten for a while (fasting). You may have this done every 1-3 years.  Abdominal aortic aneurysm (AAA) screening. You may need this if you are a current or former smoker.  Osteoporosis. You may be screened starting at age 70 if you are at high risk.  Talk with your health care provider about your test results, treatment options, and if necessary, the need for more tests. Vaccines Your health care provider may recommend certain vaccines, such as:  Influenza vaccine. This is recommended every year.  Tetanus, diphtheria, and acellular pertussis (Tdap, Td) vaccine. You may need a Td booster every 10 years.  Varicella vaccine. You may need this if you have not been vaccinated.  Zoster vaccine. You may need this after age 60.  Measles, mumps, and rubella (MMR) vaccine. You may need at least one dose of MMR if you were born in 1957 or later. You may also need a second dose.  Pneumococcal 13-valent conjugate (PCV13) vaccine. One dose is recommended after age 65.  Pneumococcal polysaccharide (PPSV23) vaccine. One dose is recommended after age 65.  Meningococcal vaccine. You may need this if you have certain conditions.  Hepatitis A vaccine. You may need this if you have certain conditions or if you travel or work in places where you   may be exposed to hepatitis A.  Hepatitis B vaccine. You may need this if you have certain conditions or if you travel or work in places where you may be exposed to hepatitis B.  Haemophilus influenzae type b (Hib) vaccine. You may need this if you have certain risk factors.  Talk to your health care provider about which screenings and vaccines  you need and how often you need them. This information is not intended to replace advice given to you by your health care provider. Make sure you discuss any questions you have with your health care provider. Document Released: 07/04/2015 Document Revised: 02/25/2016 Document Reviewed: 04/08/2015 Elsevier Interactive Patient Education  2017 [employer].

## 2016-12-06 NOTE — Progress Notes (Signed)
Patient ID: Russell Brown, male   DOB: 1945/12/04, 71 y.o.   MRN: [id number]     Subjective:  I acted as a Education administrator for Dr. Carollee Herter.  Russell Brown, Russell Brown   Patient ID: Russell Brown, male    DOB: 1945/10/15, 71 y.o.   MRN: [id number]  Chief Complaint  Patient presents with  . Annual Exam    HPI  Patient is in today for annual exam.  He has no complaints.  He is being treated for bronchitis by pulmonary but c/o wheezing and coughing -- no fever/chills  Patient Care Team: Carollee Herter, Alferd Apa, DO as PCP - General Festus Aloe, MD as Consulting Physician (Urology) Karoline Caldwell, [place] as Referring Physician (Optometry) Juanito Doom, MD as Consulting Physician (Pulmonary Disease)   Past Medical History:  Diagnosis Date  . Asthma   . GERD (gastroesophageal reflux disease)   . Hyperlipemia   . Hypertension   . Thyroid disease     Past Surgical History:  Procedure Laterality Date  . ABDOMINAL SURGERY    . APPENDECTOMY    . BACK SURGERY     cspine  . CERVICAL SPINE SURGERY    . FOOT AMPUTATION    . HAND AMPUTATION    . HERNIA REPAIR    . NISSEN FUNDOPLICATION    . SEPTOPLASTY    . STOMACH SURGERY    . THYROIDECTOMY      Family History  Problem Relation Age of Onset  . Parkinsonism Mother   . Osteoporosis Mother   . Cancer Father        prostate,  skin  . Hyperlipidemia Father   . Hypertension Father   . Asthma Son   . Osteoporosis Paternal Aunt   . Alzheimer's disease Paternal Uncle   . Osteoporosis Maternal Grandmother   . Cancer Maternal Grandmother        lung  . Heart disease Paternal Grandmother        MI  . Heart disease Paternal Grandfather        MI  . Colon cancer Neg Hx   . Pancreatic cancer Neg Hx   . Stomach cancer Neg Hx     Social History   Social History  . Marital status: Married    Spouse name: N/A  . Number of children: N/A  . Years of education: N/A   Occupational History  . retired [employer]  . Smoking status: Former Smoker    Packs/day: 1.00    Years: 35.00    Quit date: 08/18/1996  . Smokeless tobacco: Never Used  . Alcohol use 7.2 oz/week    12 Cans of beer per week  . Drug use: No  . Sexual activity: Yes    Partners: Female   Other Topics Concern  . Not on file   Social History Narrative   Exercise- weights and walk on treadmill    Outpatient Medications Prior to Visit  Medication Sig Dispense Refill  . albuterol (PROAIR HFA) 108 (90 Base) MCG/ACT inhaler Inhale 2 puffs into the lungs every 6 (six) hours as needed for wheezing or shortness of breath. 1 Inhaler 3  . aspirin (ASPIRIN EC) 81 MG EC tablet Take 81 mg by mouth daily.      [name] Kitchen atorvastatin (LIPITOR) 10 MG tablet Take 1 tablet by mouth daily at 6 PM. 90 tablet 1  . buPROPion (WELLBUTRIN XL) 150 MG 24 hr tablet TAKE 1 TABLET BY MOUTH  DAILY 90 tablet 1  . dutasteride (AVODART) 0.5 MG capsule Take 0.5 mg by mouth daily.    . fish oil-omega-3 fatty acids 1000 MG capsule Take 1 g by mouth daily.     . fluticasone furoate-vilanterol (BREO ELLIPTA) 200-25 MCG/INH AEPB Inhale 1 puff into the lungs daily. 180 each 1  . ipratropium (ATROVENT) 0.03 % nasal spray Place 2 sprays into both nostrils 3 (three) times daily as needed for rhinitis. 90 mL 3  . levothyroxine (SYNTHROID, LEVOTHROID) 75 MCG tablet TAKE 1 TABLET BY MOUTH  DAILY BEFORE BREAKFAST 90 tablet 1  . lisinopril-hydrochlorothiazide (PRINZIDE,ZESTORETIC) 10-12.5 MG tablet TAKE 1 TABLET BY MOUTH  DAILY 90 tablet 1  . Multiple Vitamin (MULTIVITAMIN) tablet Take 1 tablet by mouth daily.    [name] Kitchen omeprazole (PRILOSEC) 20 MG capsule TAKE 1 CAPSULE BY MOUTH  DAILY 90 capsule 1  . sildenafil (REVATIO) 20 MG tablet 5 po qd prn 150 tablet 0  . azithromycin (ZITHROMAX Z-PAK) 250 MG tablet Take 2 tablets (500 mg) on  Day 1,  followed by 1 tablet (250 mg) once daily on Days 2 through 5. 6 each 0  . Calcium Carbonate-Vitamin D (CALCIUM 600/VITAMIN D) 600-400  MG-UNIT per chew tablet Chew 1 tablet by mouth daily.     No facility-administered medications prior to visit.     Allergies  Allergen Reactions  . Codeine Anxiety    Review of Systems  Constitutional: Negative for chills, fever, malaise/fatigue and weight loss.  HENT: Negative for congestion and hearing loss.   Eyes: Negative for blurred vision and discharge.  Respiratory: Positive for cough, sputum production, shortness of breath and wheezing.   Cardiovascular: Negative for chest pain, palpitations and leg swelling.  Gastrointestinal: Negative for abdominal pain, blood in stool, constipation, diarrhea, heartburn, nausea and vomiting.  Genitourinary: Negative for dysuria, frequency, hematuria and urgency.  Musculoskeletal: Negative for back pain, falls and myalgias.  Skin: Negative for rash.  Neurological: Negative for dizziness, sensory change, loss of consciousness, weakness and headaches.  Endo/Heme/Allergies: Negative for environmental allergies. Does not bruise/bleed easily.  Psychiatric/Behavioral: Negative for depression and suicidal ideas. The patient is not nervous/anxious and does not have insomnia.        Objective:    Physical Exam  Constitutional: He appears well-developed and well-nourished. No distress.  HENT:  Head: Normocephalic and atraumatic.  Eyes: Conjunctivae are normal.  Neck: Normal range of motion. No thyromegaly present.  Cardiovascular: Normal rate and regular rhythm.   Pulmonary/Chest: Effort normal. He has wheezes.  Abdominal: Soft. Bowel sounds are normal. There is no tenderness.  Genitourinary:  Genitourinary Comments: Per urology  Musculoskeletal: Normal range of motion. He exhibits no edema or deformity.  Neurological: He is alert.  Skin: Skin is warm and dry. He is not diaphoretic.  Psychiatric: He has a normal mood and affect.  Nursing note and vitals reviewed.   BP 110/78 (BP Location: Left Arm, Cuff Size: Normal)   Pulse 76   Temp  98.5 F (36.9 C) (Oral)   Resp 16   Ht 6' 0.4" (1.839 m)   Wt 189 lb (85.7 kg)   SpO2 95%   BMI 25.35 kg/m  Wt Readings from Last 3 Encounters:  12/06/16 189 lb (85.7 kg)  12/02/16 191 lb (86.6 kg)  08/12/16 190 lb (86.2 kg)   BP Readings from Last 3 Encounters:  12/06/16 110/78  12/02/16 128/78  08/12/16 126/78     Immunization History  Administered Date(s) Administered  . H1N1 05/28/2008  .  Influenza Whole 04/28/2007, 03/26/2008, 04/16/2009, 05/07/2010, 04/10/2011, 04/13/2012  . Influenza, High Dose Seasonal PF 03/05/2014, 03/17/2015  . Influenza, Seasonal, Injecte, Preservative Fre 03/06/2013  . Influenza,inj,Quad PF,36+ Mos 03/04/2016  . Influenza-Unspecified 03/22/2011  . Pneumococcal Conjugate-13 12/05/2014  . Pneumococcal Polysaccharide-23 04/11/2008, 04/18/2013  . Td 05/22/2009  . Zoster 05/22/2009    Health Maintenance  Topic Date Due  . Hepatitis C Screening  Aug 09, 1945  . INFLUENZA VACCINE  01/19/2017  . TETANUS/TDAP  05/23/2019  . COLONOSCOPY  05/11/2023  . PNA vac Low Risk Adult  Completed    Lab Results  Component Value Date   WBC 10.9 (H) 07/02/2016   HGB 16.2 07/02/2016   HCT 47.8 07/02/2016   PLT 264.0 07/02/2016   GLUCOSE 95 07/02/2016   CHOL 166 07/02/2016   TRIG 109.0 07/02/2016   HDL 53.60 07/02/2016   LDLDIRECT 105.5 08/19/2011   LDLCALC 91 07/02/2016   ALT 32 07/02/2016   AST 17 07/02/2016   NA 139 07/02/2016   K 4.0 07/02/2016   CL 102 07/02/2016   CREATININE 1.01 07/02/2016   BUN 19 07/02/2016   CO2 29 07/02/2016   TSH 3.66 07/02/2016   PSA 6.92 (H) 05/20/2008   MICROALBUR <0.7 12/05/2014    Lab Results  Component Value Date   TSH 3.66 07/02/2016   Lab Results  Component Value Date   WBC 10.9 (H) 07/02/2016   HGB 16.2 07/02/2016   HCT 47.8 07/02/2016   MCV 87.6 07/02/2016   PLT 264.0 07/02/2016   Lab Results  Component Value Date   NA 139 07/02/2016   K 4.0 07/02/2016   CO2 29 07/02/2016   GLUCOSE 95  07/02/2016   BUN 19 07/02/2016   CREATININE 1.01 07/02/2016   BILITOT 0.5 07/02/2016   ALKPHOS 73 07/02/2016   AST 17 07/02/2016   ALT 32 07/02/2016   PROT 6.5 07/02/2016   ALBUMIN 4.0 07/02/2016   CALCIUM 8.9 07/02/2016   GFR 77.49 07/02/2016   Lab Results  Component Value Date   CHOL 166 07/02/2016   Lab Results  Component Value Date   HDL 53.60 07/02/2016   Lab Results  Component Value Date   LDLCALC 91 07/02/2016   Lab Results  Component Value Date   TRIG 109.0 07/02/2016   Lab Results  Component Value Date   CHOLHDL 3 07/02/2016   No results found for: HGBA1C       Assessment & Plan:   Problem List Items Addressed This Visit      Unprioritized   Acute bronchitis    Finishing z pak Check cxr today pred taper con't inhalers  Doxy if symptoms do not improve in next 4-5 days      Relevant Medications   doxycycline (VIBRA-TABS) 100 MG tablet   predniSONE (DELTASONE) 10 MG tablet   Benign prostate hyperplasia    Per urology      Hyperlipidemia LDL goal <100   Relevant Orders   POCT Urinalysis Dipstick (Automated)   CBC with Differential/Platelet   Comprehensive metabolic panel   Lipid panel   Preventative health care - Primary    ghm utd shingrix today Check labs See AVS        Other Visit Diagnoses    Encounter for hepatitis C screening test for low risk patient       Relevant Orders   Hepatitis C antibody   Right knee pain, unspecified chronicity       Relevant Orders   Ambulatory referral to Orthopedic  Surgery   Cough       Relevant Orders   DG Chest 2 View (Completed)      I have discontinued Mr. Fetterly's azithromycin. I am also having him start on doxycycline and predniSONE. Additionally, I am having him maintain his aspirin, dutasteride, fish oil-omega-3 fatty acids, Calcium Carbonate-Vitamin D, multivitamin, albuterol, ipratropium, sildenafil, atorvastatin, fluticasone furoate-vilanterol, lisinopril-hydrochlorothiazide,  omeprazole, buPROPion, and levothyroxine.  Meds ordered this encounter  Medications  . doxycycline (VIBRA-TABS) 100 MG tablet    Sig: Take 1 tablet (100 mg total) by mouth daily.    Dispense:  40 tablet    Refill:  0  . predniSONE (DELTASONE) 10 MG tablet    Sig: TAKE 3 TABLETS PO QD FOR 3 DAYS THEN TAKE 2 TABLETS PO QD FOR 3 DAYS THEN TAKE 1 TABLET PO QD FOR 3 DAYS THEN TAKE 1/2 TAB PO QD FOR 3 DAYS    Dispense:  20 tablet    Refill:  0    CMA served as Education administrator during this visit. History, Physical and Plan performed by medical provider. Documentation and orders reviewed and attested to.  Ann Held, DO

## 2016-12-06 NOTE — Assessment & Plan Note (Signed)
ghm utd shingrix today Check labs See AVS

## 2016-12-06 NOTE — Assessment & Plan Note (Signed)
Finishing z pak Check cxr today pred taper con't inhalers  Doxy if symptoms do not improve in next 4-5 days

## 2016-12-06 NOTE — Assessment & Plan Note (Signed)
Per u rology 

## 2016-12-07 ENCOUNTER — Other Ambulatory Visit: Payer: Self-pay | Admitting: Family Medicine

## 2016-12-07 DIAGNOSIS — R739 Hyperglycemia, unspecified: Secondary | ICD-10-CM

## 2016-12-07 DIAGNOSIS — E785 Hyperlipidemia, unspecified: Secondary | ICD-10-CM

## 2016-12-30 ENCOUNTER — Other Ambulatory Visit: Payer: Self-pay | Admitting: Pulmonary Disease

## 2016-12-30 ENCOUNTER — Other Ambulatory Visit: Payer: Self-pay | Admitting: Family Medicine

## 2016-12-30 DIAGNOSIS — E785 Hyperlipidemia, unspecified: Secondary | ICD-10-CM

## 2017-01-28 ENCOUNTER — Other Ambulatory Visit: Payer: Self-pay | Admitting: Emergency Medicine

## 2017-01-28 ENCOUNTER — Encounter: Payer: Self-pay | Admitting: Pulmonary Disease

## 2017-01-28 MED ORDER — FLUTICASONE FUROATE-VILANTEROL 200-25 MCG/INH IN AEPB: 1.0000 | INHALATION_SPRAY | Freq: Every day | RESPIRATORY_TRACT | 2 refills | Status: DC

## 2017-02-03 ENCOUNTER — Encounter: Payer: Self-pay | Admitting: Family Medicine

## 2017-02-03 NOTE — Telephone Encounter (Signed)
We need to see him He can try antihistamine otc , tylenol or advil

## 2017-03-20 ENCOUNTER — Other Ambulatory Visit: Payer: Self-pay | Admitting: Family Medicine

## 2017-03-26 ENCOUNTER — Encounter: Payer: Self-pay | Admitting: Family Medicine

## 2017-03-26 DIAGNOSIS — E039 Hypothyroidism, unspecified: Secondary | ICD-10-CM

## 2017-03-28 MED ORDER — LEVOTHYROXINE SODIUM 75 MCG PO TABS: 75.0000 ug | ORAL_TABLET | Freq: Every day | ORAL | 0 refills | Status: DC

## 2017-04-18 ENCOUNTER — Telehealth: Payer: Self-pay | Admitting: *Deleted

## 2017-04-18 NOTE — Telephone Encounter (Signed)
Received Provider Query from [employer] for Medical Record Clarification on Depression for Coding purposes, OV note attached; forwarded to provider/SLS 10/29   

## 2017-06-28 ENCOUNTER — Telehealth: Payer: Self-pay | Admitting: Family Medicine

## 2017-06-28 NOTE — Telephone Encounter (Signed)
Spoke with patient's wife Russell Brown regarding [employer]. Wife stated that both she and [name] have someone coming to their home from [facility] to complete their wellness visit for the year and is not interested in scheduling wellness visit with health coach this year (2019).

## 2017-07-17 ENCOUNTER — Other Ambulatory Visit: Payer: Self-pay | Admitting: Family Medicine

## 2017-07-17 DIAGNOSIS — I10 Essential (primary) hypertension: Secondary | ICD-10-CM

## 2017-07-17 DIAGNOSIS — E039 Hypothyroidism, unspecified: Secondary | ICD-10-CM

## 2017-07-20 ENCOUNTER — Encounter: Payer: Self-pay | Admitting: Family Medicine

## 2017-07-21 NOTE — Telephone Encounter (Signed)
Ok to send in wellbutrin xl 300 mg #90  1 po qd, 2 refills

## 2017-07-27 ENCOUNTER — Encounter: Payer: Self-pay | Admitting: Family Medicine

## 2017-07-27 MED ORDER — BUPROPION HCL ER (XL) 300 MG PO TB24: 300.0000 mg | ORAL_TABLET | Freq: Every day | ORAL | 2 refills | Status: DC

## 2017-07-27 NOTE — Telephone Encounter (Signed)
Roma Schanz R, DO      10:26 AM  Note    Ok to send in wellbutrin xl 300 mg #90  1 po qd, 2 refills

## 2017-08-02 NOTE — Telephone Encounter (Signed)
See 07/27/17 phone note.

## 2017-08-09 ENCOUNTER — Encounter: Payer: Self-pay | Admitting: Physician Assistant

## 2017-08-09 ENCOUNTER — Ambulatory Visit (INDEPENDENT_AMBULATORY_CARE_PROVIDER_SITE_OTHER): Payer: Medicare Other | Admitting: Physician Assistant

## 2017-08-09 VITALS — BP 114/64 | HR 80 | Ht 73.0 in | Wt 194.1 lb

## 2017-08-09 DIAGNOSIS — K219 Gastro-esophageal reflux disease without esophagitis: Secondary | ICD-10-CM | POA: Diagnosis not present

## 2017-08-09 MED ORDER — AMBULATORY NON FORMULARY MEDICATION: 0 refills | Status: DC

## 2017-08-09 MED ORDER — OMEPRAZOLE 40 MG PO CPDR: 40.0000 mg | DELAYED_RELEASE_CAPSULE | Freq: Every day | ORAL | 3 refills | Status: DC

## 2017-08-09 NOTE — Patient Instructions (Signed)

## 2017-08-09 NOTE — Progress Notes (Signed)
Chief Complaint: GERD  HPI:  Russell Brown is a 72 year old Caucasian male known to Dr. Henrene Pastor with a past medical history of COPD GERD and PTSD, who was referred to me by Carollee Herter, Alferd Apa, * for a complaint of reflux.      Last colonoscopy 05/10/13 with Dr. Henrene Pastor with finding of 2 polyps, moderate diverticulosis and otherwise normal. Polyps benign, repeat recommended in 10 yrs.    Today, explains that he had a Nissen fundoplication in 9211 which worked for 7 years and then he started with reflux symptoms.  Controlled on Omeprazole 20 mg until recently, about 2 weeks ago the patient experienced a lot of burning in his throat which would come up even when "I was standing to brush my teeth in the morning".  Finds himself constantly clearing his throat and is hoarse.  Describes feeling of a "lump in my throat".  Initially patient was using Gaviscon 3 tabs 3 or 4 times a day in addition to his Omeprazole 20 mg which he takes at bedtime, this has "calm down now" and he is no longer requiring Gaviscon but does describe daily burning of his esophagus.  He can swallow something hot and " feel it go all the way down".  Patient is worried regarding long-lasting effects/damage from his reflux over the years.    Patient denies fever, chills, weight loss, anorexia, nausea, vomiting or symptoms that awaken him at night. No new medications or use of NSAIDs.  Past Medical History:  Diagnosis Date  . Asthma   . COPD (chronic obstructive pulmonary disease) ([place])   . GERD (gastroesophageal reflux disease)   . Hyperlipemia   . Hypertension   . PTSD (post-traumatic stress disorder)   . Thyroid disease     Past Surgical History:  Procedure Laterality Date  . ABDOMINAL SURGERY    . APPENDECTOMY    . BACK SURGERY     cspine  . CERVICAL SPINE SURGERY    . FOOT AMPUTATION    . HAND AMPUTATION    . HERNIA REPAIR    . NISSEN FUNDOPLICATION    . SEPTOPLASTY    . STOMACH SURGERY    . THYROIDECTOMY       Current Outpatient Medications  Medication Sig Dispense Refill  . albuterol (PROAIR HFA) 108 (90 Base) MCG/ACT inhaler Inhale 2 puffs into the lungs every 6 (six) hours as needed for wheezing or shortness of breath. 1 Inhaler 3  . aspirin (ASPIRIN EC) 81 MG EC tablet Take 81 mg by mouth daily.      [name] Kitchen atorvastatin (LIPITOR) 10 MG tablet TAKE 1 TABLET BY MOUTH  DAILY AT 6 PM 90 tablet 2  . buPROPion (WELLBUTRIN XL) 300 MG 24 hr tablet Take 1 tablet (300 mg total) by mouth daily. 90 tablet 2  . dutasteride (AVODART) 0.5 MG capsule Take 0.5 mg by mouth daily.    . fish oil-omega-3 fatty acids 1000 MG capsule Take 1 g by mouth daily.     . fluticasone furoate-vilanterol (BREO ELLIPTA) 200-25 MCG/INH AEPB Inhale 1 puff into the lungs daily. 180 each 2  . ipratropium (ATROVENT) 0.03 % nasal spray Place 2 sprays into both nostrils 3 (three) times daily as needed for rhinitis. 90 mL 3  . levothyroxine (SYNTHROID, LEVOTHROID) 75 MCG tablet TAKE 1 TABLET BY MOUTH  DAILY BEFORE BREAKFAST 90 tablet 0  . lisinopril-hydrochlorothiazide (PRINZIDE,ZESTORETIC) 10-12.5 MG tablet TAKE 1 TABLET BY MOUTH  DAILY 90 tablet 1  . Multiple Vitamin (  MULTIVITAMIN) tablet Take 1 tablet by mouth daily.    . Calcium Carbonate-Vitamin D (CALCIUM 600/VITAMIN D) 600-400 MG-UNIT per chew tablet Chew 1 tablet by mouth daily.    [name] Kitchen omeprazole (PRILOSEC) 40 MG capsule Take 1 capsule (40 mg total) by mouth daily. 90 capsule 3   No current facility-administered medications for this visit.     Allergies as of 08/09/2017 - Review Complete 08/09/2017  Allergen Reaction Noted  . Codeine Anxiety 07/04/2008    Family History  Problem Relation Age of Onset  . Parkinsonism Mother   . Osteoporosis Mother   . Cancer Father        prostate,  skin  . Hyperlipidemia Father   . Hypertension Father   . Asthma Son   . Osteoporosis Paternal Aunt   . Alzheimer's disease Paternal Uncle   . Osteoporosis Maternal Grandmother   . Cancer  Maternal Grandmother        lung  . Heart disease Paternal Grandmother        MI  . Heart disease Paternal Grandfather        MI  . Colon cancer Neg Hx   . Pancreatic cancer Neg Hx   . Stomach cancer Neg Hx     Social History   Socioeconomic History  . Marital status: Married    Spouse name: Not on file  . Number of children: 4  . Years of education: Not on file  . Highest education level: Not on file  Social Needs  . Financial resource strain: Not on file  . Food insecurity - worry: Not on file  . Food insecurity - inability: Not on file  . Transportation needs - medical: Not on file  . Transportation needs - non-medical: Not on file  Occupational History  . Occupation: retired    Fish farm manager: [employer]  Tobacco Use  . Smoking status: Former Smoker    Packs/day: 1.00    Years: 35.00    Pack years: 35.00    Last attempt to quit: 08/18/1996    Years since quitting: 20.9  . Smokeless tobacco: Never Used  Substance and Sexual Activity  . Alcohol use: Yes    Alcohol/week: 4.2 oz    Types: 7 Cans of beer per week    Comment: DAILY  . Drug use: No  . Sexual activity: Yes    Partners: Female  Other Topics Concern  . Not on file  Social History Narrative   Exercise- weights and walk on treadmill    Review of Systems:    Constitutional: No weight loss, fever or chills Skin: No rash  Cardiovascular: No chest pain  Respiratory: No SOB Gastrointestinal: See HPI and otherwise negative Genitourinary: No dysuria Neurological: No headache Musculoskeletal: No new muscle or joint pain Hematologic: No bleeding  Psychiatric: No history of depression or anxiety   Physical Exam:  Vital signs: BP 114/64   Pulse 80   Ht 6\' 1"  (1.854 m)   Wt 194 lb 2 oz (88.1 kg)   BMI 25.61 kg/m   Constitutional:   Pleasant elderly Caucasian male appears to be in NAD, Well developed, Well nourished, alert and cooperative Head:  Normocephalic and atraumatic. Eyes:   PEERL, EOMI. No  icterus. Conjunctiva pink. Ears:  Normal auditory acuity. Neck:  Supple Throat: Oral cavity and pharynx without inflammation, swelling or lesion.  Respiratory: Respirations even and unlabored. Lungs clear to auscultation bilaterally.   No wheezes, crackles, or rhonchi.  Cardiovascular: Normal S1, S2. No MRG.  Regular rate and rhythm. No peripheral edema, cyanosis or pallor.  Gastrointestinal:  Soft, nondistended, nontender. No rebound or guarding. Normal bowel sounds. No appreciable masses or hepatomegaly. Rectal:  Not performed.  Msk:  Symmetrical without gross deformities. Without edema, no deformity or joint abnormality.  Neurologic:  Alert and  oriented x4;  grossly normal neurologically.  Skin:   Dry and intact without significant lesions or rashes. Psychiatric:  Demonstrates good judgement and reason without abnormal affect or behaviors.  No recent labs or imaging.  Assessment: 1.  GERD: Status post Nissen fundoplication in 2446, reflux symptoms ever since 2000, controlled on Omeprazole 20 mg until 2 weeks ago, also intermittent breakthrough in between; consider gastritis versus esophagitis versus H. pylori  Plan: 1.  Scheduled patient for EGD with Dr. Henrene Pastor in the [facility].  Did discuss risk, benefits, limitations alternatives and the patient agrees to proceed. 2.  Increased patient's Omeprazole to 40 mg daily, 30-60 minutes before breakfast.  #90 sent to his mail order pharmacy [facility]. 3.  Prescribed GI cocktail to be used 5-10 mL's every 4-6 hours as needed. 4.  Reviewed antireflux diet and lifestyle modifications. 5.  Patient to return to clinic per recommendations from Dr. Henrene Pastor after time of procedure.  Ellouise Newer, PA-C [facility] 08/09/2017, 10:48 AM  Cc: Carollee Herter, Alferd Apa, *

## 2017-08-09 NOTE — Progress Notes (Signed)
Assessment and plans reviewed  

## 2017-09-06 ENCOUNTER — Encounter: Payer: Self-pay | Admitting: Internal Medicine

## 2017-09-19 ENCOUNTER — Other Ambulatory Visit: Payer: Self-pay

## 2017-09-19 ENCOUNTER — Ambulatory Visit (AMBULATORY_SURGERY_CENTER): Payer: Medicare Other | Admitting: Internal Medicine

## 2017-09-19 ENCOUNTER — Encounter: Payer: Self-pay | Admitting: Internal Medicine

## 2017-09-19 VITALS — BP 114/67 | HR 61 | Temp 96.6°F | Resp 15 | Ht 73.0 in | Wt 194.0 lb

## 2017-09-19 DIAGNOSIS — K219 Gastro-esophageal reflux disease without esophagitis: Secondary | ICD-10-CM

## 2017-09-19 MED ORDER — SODIUM CHLORIDE 0.9 % IV SOLN: 500.0000 mL | INTRAVENOUS | Status: DC

## 2017-09-19 MED ORDER — OMEPRAZOLE 40 MG PO CPDR: 40.0000 mg | DELAYED_RELEASE_CAPSULE | Freq: Two times a day (BID) | ORAL | 11 refills | Status: DC

## 2017-09-19 NOTE — Progress Notes (Signed)
Pt self administers 2 puffs albuterol by mdi.

## 2017-09-19 NOTE — Op Note (Signed)
[facility] Patient Name: Russell Brown Procedure Date: 09/19/2017 9:59 AM MRN: [id number] Endoscopist: Docia Chuck. Henrene Pastor , MD Age: 72 Referring MD:  Date of Birth: March 07, 1946 Gender: Male Account #: [id number] Procedure:                Upper GI endoscopy Indications:              Esophageal reflux, Laryngitis Medicines:                Monitored Anesthesia Care Procedure:                Pre-Anesthesia Assessment:                           - Prior to the procedure, a History and Physical                            was performed, and patient medications and                            allergies were reviewed. The patient's tolerance of                            previous anesthesia was also reviewed. The risks                            and benefits of the procedure and the sedation                            options and risks were discussed with the patient.                            All questions were answered, and informed consent                            was obtained. Prior Anticoagulants: The patient has                            taken no previous anticoagulant or antiplatelet                            agents. ASA Grade Assessment: II - A patient with                            mild systemic disease. After reviewing the risks                            and benefits, the patient was deemed in                            satisfactory condition to undergo the procedure.                           After obtaining informed consent, the endoscope was  passed under direct vision. Throughout the                            procedure, the patient's blood pressure, pulse, and                            oxygen saturations were monitored continuously. The                            Model GIF-HQ190 [id number]) scope was introduced                            through the mouth, and advanced to the second part                            of duodenum. The upper GI  endoscopy was                            accomplished without difficulty. The patient                            tolerated the procedure well. Scope In: Scope Out: Findings:                 The esophagus was normal.                           The stomach was normal. Prior fundoplication wrap                            was undone.                           The examined duodenum was normal, Save incidental                            periampullary diverticulum.                           The cardia and gastric fundus were normal on                            retroflexion. Complications:            No immediate complications. Estimated Blood Loss:     Estimated blood loss: none. Impression:               1. GERD. Possible cause for hoarseness and chronic                            throat clearing                           2. History of fundoplication remotely. Wrap undone                           3. Incidental periampullary diverticulum. Recommendation:  1. Reflux precautions                           2. Increase omeprazole to 40 mg twice daily; #60;                            11 refills                           3. Please have your primary care physician arrange                            a formal ENT evaluation regarding her chronic                            hoarseness                           4. Assuming no significant ENT problem to account                            for hoarseness, GI follow-up in 3-4 months on                            higher dose PPI. Docia Chuck. Henrene Pastor, MD 09/19/2017 10:23:57 AM This report has been signed electronically.

## 2017-09-19 NOTE — Progress Notes (Signed)
To recovery, report to RN, VSS. 

## 2017-09-19 NOTE — Patient Instructions (Signed)
YOU HAD AN ENDOSCOPIC PROCEDURE TODAY: Refer to the procedure report and other information in the discharge instructions given to you for any specific questions about what was found during the examination. If this information does not answer your questions, please call [place] office at [phone] to clarify.   YOU SHOULD EXPECT: Some feelings of bloating in the abdomen. Passage of more gas than usual. Walking can help get rid of the air that was put into your GI tract during the procedure and reduce the bloating. If you had a lower endoscopy (such as a colonoscopy or flexible sigmoidoscopy) you may notice spotting of blood in your stool or on the toilet paper. Some abdominal soreness may be present for a day or two, also.  DIET: Your first meal following the procedure should be a light meal and then it is ok to progress to your normal diet. A half-sandwich or bowl of soup is an example of a good first meal. Heavy or fried foods are harder to digest and may make you feel nauseous or bloated. Drink plenty of fluids but you should avoid alcoholic beverages for 24 hours. If you had a esophageal dilation, please see attached instructions for diet.    ACTIVITY: Your care partner should take you home directly after the procedure. You should plan to take it easy, moving slowly for the rest of the day. You can resume normal activity the day after the procedure however YOU SHOULD NOT DRIVE, use power tools, machinery or perform tasks that involve climbing or major physical exertion for 24 hours (because of the sedation medicines used during the test).   SYMPTOMS TO REPORT IMMEDIATELY: A gastroenterologist can be reached at any hour. Please call [phone]  for any of the following symptoms:   Following upper endoscopy (EGD, EUS, ERCP, esophageal dilation) Vomiting of blood or coffee ground material  New, significant abdominal pain  New, significant chest pain or pain under the shoulder blades  Painful or  persistently difficult swallowing  New shortness of breath  Black, tarry-looking or red, bloody stools  FOLLOW UP:  If any biopsies were taken you will be contacted by phone or by letter within the next 1-3 weeks. Call [phone]  if you have not heard about the biopsies in 3 weeks.  Please also call with any specific questions about appointments or follow up tests.  

## 2017-09-20 ENCOUNTER — Telehealth: Payer: Self-pay

## 2017-09-20 NOTE — Telephone Encounter (Signed)
  Follow up Call-  Call back number 09/19/2017  Post procedure Call Back phone  # [phone] hm  Permission to leave phone message Yes  Some recent data might be hidden     Patient questions:  Do you have a fever, pain , or abdominal swelling? No. Pain Score  0 *  Have you tolerated food without any problems? Yes.    Have you been able to return to your normal activities? Yes.    Do you have any questions about your discharge instructions: Diet   No. Medications  No. Follow up visit  No.  Do you have questions or concerns about your Care? No.  Actions: * If pain score is 4 or above: No action needed, pain <4.  I spoke with pt's wife and she thanked [place] for "being so nice".  No problems noted per pt's wife.  I asked her to please let the pt know we called to check on him and to call us back if any questions or concerns. maw

## 2017-10-16 ENCOUNTER — Other Ambulatory Visit: Payer: Self-pay | Admitting: Family Medicine

## 2017-10-16 DIAGNOSIS — E785 Hyperlipidemia, unspecified: Secondary | ICD-10-CM

## 2017-10-16 DIAGNOSIS — E039 Hypothyroidism, unspecified: Secondary | ICD-10-CM

## 2017-11-29 ENCOUNTER — Other Ambulatory Visit: Payer: Self-pay | Admitting: Adult Health

## 2017-12-05 ENCOUNTER — Encounter: Payer: Self-pay | Admitting: Internal Medicine

## 2017-12-13 ENCOUNTER — Ambulatory Visit (INDEPENDENT_AMBULATORY_CARE_PROVIDER_SITE_OTHER): Payer: Medicare Other | Admitting: Family Medicine

## 2017-12-13 ENCOUNTER — Encounter: Payer: Self-pay | Admitting: Family Medicine

## 2017-12-13 VITALS — BP 114/61 | HR 82 | Temp 98.7°F | Resp 18 | Ht 72.84 in | Wt 184.0 lb

## 2017-12-13 DIAGNOSIS — F419 Anxiety disorder, unspecified: Secondary | ICD-10-CM

## 2017-12-13 DIAGNOSIS — E039 Hypothyroidism, unspecified: Secondary | ICD-10-CM

## 2017-12-13 DIAGNOSIS — N401 Enlarged prostate with lower urinary tract symptoms: Secondary | ICD-10-CM | POA: Diagnosis not present

## 2017-12-13 DIAGNOSIS — E785 Hyperlipidemia, unspecified: Secondary | ICD-10-CM | POA: Diagnosis not present

## 2017-12-13 DIAGNOSIS — Z0000 Encounter for general adult medical examination without abnormal findings: Secondary | ICD-10-CM

## 2017-12-13 DIAGNOSIS — I10 Essential (primary) hypertension: Secondary | ICD-10-CM

## 2017-12-13 MED ORDER — DUTASTERIDE 0.5 MG PO CAPS: 0.5000 mg | ORAL_CAPSULE | Freq: Every day | ORAL | 3 refills | Status: DC

## 2017-12-13 MED ORDER — LEVOTHYROXINE SODIUM 75 MCG PO TABS: ORAL_TABLET | ORAL | 3 refills | Status: DC

## 2017-12-13 MED ORDER — SERTRALINE HCL 25 MG PO TABS: 25.0000 mg | ORAL_TABLET | Freq: Every day | ORAL | 1 refills | Status: DC

## 2017-12-13 MED ORDER — LISINOPRIL-HYDROCHLOROTHIAZIDE 10-12.5 MG PO TABS: 1.0000 | ORAL_TABLET | Freq: Every day | ORAL | 1 refills | Status: DC

## 2017-12-13 NOTE — Progress Notes (Signed)
Subjective:  I acted as a Education administrator for [employer]. Russell Brown, [name]   Patient ID: Russell Brown, male    DOB: August 24, 1945, 72 y.o.   MRN: [id number]  Chief Complaint  Patient presents with  . Annual Exam    HPI  Patient is in today for annual exam.   No complaints.   He also needs refills on regular meds  Patient Care Team: Carollee Herter, Alferd Apa, DO as PCP - General Festus Aloe, MD as Consulting Physician (Urology) Juanito Doom, MD as Consulting Physician (Pulmonary Disease)   Past Medical History:  Diagnosis Date  . 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 Asthma    pt has albuteral inhaler.  [name] Kitchen COPD (chronic obstructive pulmonary disease) ([place])   . GERD (gastroesophageal reflux disease)   . Hyperlipemia   . Hypertension   . PTSD (post-traumatic stress disorder)   . PTSD (post-traumatic stress disorder)    retired Engineer, structural   . Sleep apnea    mild - but does not wear a c-pap per pt  . Thyroid disease    thyroid nodules - half of thyroid was removed per pt    Past Surgical History:  Procedure Laterality Date  . ABDOMINAL SURGERY    . APPENDECTOMY    . BACK SURGERY     cspine  . CERVICAL SPINE SURGERY    . HAND AMPUTATION    . HERNIA REPAIR    . NISSEN FUNDOPLICATION    . SEPTOPLASTY    . STOMACH SURGERY    . THYROIDECTOMY    . WISDOM TOOTH EXTRACTION      Family History  Problem Relation Age of Onset  . Parkinsonism Mother   . Osteoporosis Mother   . Cancer Father        prostate,  skin  . Hyperlipidemia Father   . Hypertension Father   . Prostate cancer Father   . Asthma Son   . Osteoporosis Paternal Aunt   . Alzheimer's disease Paternal Uncle   . Osteoporosis Maternal Grandmother   . Cancer Maternal Grandmother        lung  . Heart disease Paternal Grandmother        MI  . Heart disease Paternal Grandfather        MI  . Colon cancer Neg Hx   . Pancreatic cancer Neg Hx   . Stomach cancer Neg Hx    . Esophageal cancer Neg Hx   . Liver cancer Neg Hx   . Rectal cancer Neg Hx     Social History   Socioeconomic History  . Marital status: Married    Spouse name: Not on file  . Number of children: 4  . Years of education: Not on file  . Highest education level: Not on file  Occupational History  . Occupation: retired    Fish farm manager: [employer]  . Financial resource strain: Not on file  . Food insecurity:    Worry: Not on file    Inability: Not on file  . Transportation needs:    Medical: Not on file    Non-medical: Not on file  Tobacco Use  . Smoking status: Former Smoker    Packs/day: 1.00    Years: 35.00    Pack years: 35.00    Types: Cigarettes    Last attempt to quit: 08/18/1996    Years since quitting: 21.3  . Smokeless tobacco: Never Used  Substance and Sexual Activity  . Alcohol use: Yes    Alcohol/week: 0.6 oz    Types: 1 Cans of beer per week    Comment: DAILY 1 beer per day  . Drug use: No  . Sexual activity: Yes    Partners: Female  Lifestyle  . Physical activity:    Days per week: Not on file    Minutes per session: Not on file  . Stress: Not on file  Relationships  . Social connections:    Talks on phone: Not on file    Gets together: Not on file    Attends religious service: Not on file    Active member of club or organization: Not on file    Attends meetings of clubs or organizations: Not on file    Relationship status: Not on file  . Intimate partner violence:    Fear of current or ex partner: Not on file    Emotionally abused: Not on file    Physically abused: Not on file    Forced sexual activity: Not on file  Other Topics Concern  . Not on file  Social History Narrative   Exercise- weights and walk on treadmill    Outpatient Medications Prior to Visit  Medication Sig Dispense Refill  . albuterol (PROAIR HFA) 108 (90 Base) MCG/ACT inhaler Inhale 2 puffs into the lungs every 6 (six) hours as needed for wheezing or  shortness of breath. 1 Inhaler 3  . AMBULATORY NON FORMULARY MEDICATION Medication Name: Gi cocktail 5-10 MLs every 4-6 hours as needed 450 mL 0  . atorvastatin (LIPITOR) 10 MG tablet TAKE 1 TABLET BY MOUTH  DAILY AT 6 PM 90 tablet 2  . BREO ELLIPTA 200-25 MCG/INH AEPB USE 1 INHALATION DAILY 180 each 0  . buPROPion (WELLBUTRIN XL) 300 MG 24 hr tablet Take 1 tablet (300 mg total) by mouth daily. 90 tablet 2  . fish oil-omega-3 fatty acids 1000 MG capsule Take 1 g by mouth daily.     [name] Kitchen ipratropium (ATROVENT) 0.03 % nasal spray Place 2 sprays into both nostrils 3 (three) times daily as needed for rhinitis. 90 mL 3  . Multiple Vitamin (MULTIVITAMIN) tablet Take 1 tablet by mouth daily.    [name] Kitchen omeprazole (PRILOSEC) 40 MG capsule Take 1 capsule (40 mg total) by mouth daily. 90 capsule 3  . dutasteride (AVODART) 0.5 MG capsule Take 0.5 mg by mouth daily.    [name] Kitchen levothyroxine (SYNTHROID, LEVOTHROID) 75 MCG tablet TAKE 1 TABLET BY MOUTH  DAILY BEFORE BREAKFAST 90 tablet 0  . lisinopril-hydrochlorothiazide (PRINZIDE,ZESTORETIC) 10-12.5 MG tablet TAKE 1 TABLET BY MOUTH  DAILY 90 tablet 1  . Calcium Carbonate-Vitamin D (CALCIUM 600/VITAMIN D) 600-400 MG-UNIT per chew tablet Chew 1 tablet by mouth daily.    [name] Kitchen aspirin (ASPIRIN EC) 81 MG EC tablet Take 81 mg by mouth daily.      [name] Kitchen omeprazole (PRILOSEC) 40 MG capsule Take 1 capsule (40 mg total) by mouth 2 (two) times daily. 60 capsule 11   Facility-Administered Medications Prior to Visit  Medication Dose Route Frequency Provider Last Rate Last Dose  . 0.9 %  sodium chloride infusion  500 mL Intravenous Continuous Irene Shipper, MD        Allergies  Allergen Reactions  . Codeine Anxiety    Review of Systems  Constitutional: Negative for chills, fever and malaise/fatigue.  HENT: Negative for congestion and hearing loss.   Eyes: Negative for blurred vision and discharge.  Respiratory: Negative  for cough, sputum production and shortness of breath.     Cardiovascular: Negative for chest pain, palpitations and leg swelling.  Gastrointestinal: Negative for abdominal pain, blood in stool, constipation, diarrhea, heartburn, nausea and vomiting.  Genitourinary: Negative for dysuria, frequency, hematuria and urgency.  Musculoskeletal: Negative for back pain, falls and myalgias.  Skin: Negative for rash.  Neurological: Negative for dizziness, sensory change, loss of consciousness, weakness and headaches.  Endo/Heme/Allergies: Negative for environmental allergies. Does not bruise/bleed easily.  Psychiatric/Behavioral: Negative for depression and suicidal ideas. The patient is not nervous/anxious and does not have insomnia.        Objective:    Physical Exam  Constitutional: He is oriented to person, place, and time. He appears well-developed and well-nourished. No distress.  HENT:  Head: Normocephalic and atraumatic.  Right Ear: External ear normal.  Left Ear: External ear normal.  Nose: Nose normal.  Mouth/Throat: Oropharynx is clear and moist. No oropharyngeal exudate.  Eyes: Pupils are equal, round, and reactive to light. Conjunctivae and EOM are normal. Right eye exhibits no discharge. Left eye exhibits no discharge.  Neck: Normal range of motion. Neck supple. No JVD present. No thyromegaly present.  Cardiovascular: Normal rate, regular rhythm and intact distal pulses.  No murmur heard. Pulmonary/Chest: Effort normal and breath sounds normal. No respiratory distress. He has no wheezes. He has no rales. He exhibits no tenderness.  Abdominal: Soft. Bowel sounds are normal. He exhibits no distension and no mass. There is no tenderness. There is no rebound and no guarding.  Genitourinary:  Genitourinary Comments: Per urology  Musculoskeletal: Normal range of motion. He exhibits no edema or tenderness.  Lymphadenopathy:    He has no cervical adenopathy.  Neurological: He is alert and oriented to person, place, and time. He has normal  reflexes. He displays normal reflexes. No cranial nerve deficit. He exhibits normal muscle tone.  Skin: Skin is warm and dry. No rash noted. He is not diaphoretic. No erythema.  Psychiatric: He has a normal mood and affect. His behavior is normal. Judgment and thought content normal.  Nursing note and vitals reviewed.   BP 114/61 (BP Location: Left Arm, Patient Position: Sitting, Cuff Size: Normal)   Pulse 82   Temp 98.7 F (37.1 C) (Oral)   Resp 18   Ht 6' 0.84" (1.85 m)   Wt 184 lb (83.5 kg)   SpO2 98%   BMI 24.39 kg/m  Wt Readings from Last 3 Encounters:  12/13/17 184 lb (83.5 kg)  09/19/17 194 lb (88 kg)  08/09/17 194 lb 2 oz (88.1 kg)   BP Readings from Last 3 Encounters:  12/13/17 114/61  09/19/17 114/67  08/09/17 114/64     Immunization History  Administered Date(s) Administered  . H1N1 05/28/2008  . Influenza Whole 04/28/2007, 03/26/2008, 04/16/2009, 05/07/2010, 04/10/2011, 04/13/2012  . Influenza, High Dose Seasonal PF 03/05/2014, 03/17/2015  . Influenza, Seasonal, Injecte, Preservative Fre 03/06/2013  . Influenza,inj,Quad PF,6+ Mos 03/04/2016  . Influenza-Unspecified 03/22/2011  . Pneumococcal Conjugate-13 12/05/2014  . Pneumococcal Polysaccharide-23 04/11/2008, 04/18/2013  . Td 05/22/2009  . Zoster 05/22/2009  . Zoster Recombinat (Shingrix) 03/21/2017, 09/10/2017    Health Maintenance  Topic Date Due  . INFLUENZA VACCINE  01/19/2018  . TETANUS/TDAP  05/23/2019  . COLONOSCOPY  05/11/2023  . Hepatitis C Screening  Completed  . PNA vac Low Risk Adult  Completed    Lab Results  Component Value Date   WBC 7.2 12/06/2016   HGB 16.0 12/06/2016   HCT 48.0  12/06/2016   PLT 260.0 12/06/2016   GLUCOSE 107 (H) 12/06/2016   CHOL 162 12/06/2016   TRIG 139.0 12/06/2016   HDL 43.60 12/06/2016   LDLDIRECT 105.5 08/19/2011   LDLCALC 91 12/06/2016   ALT 25 12/06/2016   AST 19 12/06/2016   NA 139 12/06/2016   K 4.6 12/06/2016   CL 103 12/06/2016   CREATININE  1.09 12/06/2016   BUN 17 12/06/2016   CO2 30 12/06/2016   TSH 3.66 07/02/2016   PSA 6.92 (H) 05/20/2008   MICROALBUR <0.7 12/05/2014    Lab Results  Component Value Date   TSH 3.66 07/02/2016   Lab Results  Component Value Date   WBC 7.2 12/06/2016   HGB 16.0 12/06/2016   HCT 48.0 12/06/2016   MCV 89.8 12/06/2016   PLT 260.0 12/06/2016   Lab Results  Component Value Date   NA 139 12/06/2016   K 4.6 12/06/2016   CO2 30 12/06/2016   GLUCOSE 107 (H) 12/06/2016   BUN 17 12/06/2016   CREATININE 1.09 12/06/2016   BILITOT 0.7 12/06/2016   ALKPHOS 81 12/06/2016   AST 19 12/06/2016   ALT 25 12/06/2016   PROT 6.9 12/06/2016   ALBUMIN 4.4 12/06/2016   CALCIUM 9.4 12/06/2016   GFR 70.88 12/06/2016   Lab Results  Component Value Date   CHOL 162 12/06/2016   Lab Results  Component Value Date   HDL 43.60 12/06/2016   Lab Results  Component Value Date   LDLCALC 91 12/06/2016   Lab Results  Component Value Date   TRIG 139.0 12/06/2016   Lab Results  Component Value Date   CHOLHDL 4 12/06/2016   No results found for: HGBA1C       Assessment & Plan:   Problem List Items Addressed This Visit      Unprioritized   Anxiety    Stable  con't meds      Relevant Medications   sertraline (ZOLOFT) 25 MG tablet   Benign prostate hyperplasia    Per urology       Relevant Medications   dutasteride (AVODART) 0.5 MG capsule   Essential hypertension    Well controlled, no changes to meds. Encouraged heart healthy diet such as the DASH diet and exercise as tolerated.       Relevant Medications   lisinopril-hydrochlorothiazide (PRINZIDE,ZESTORETIC) 10-12.5 MG tablet   Other Relevant Orders   CBC with Differential/Platelet   Comprehensive metabolic panel   Hyperlipidemia LDL goal <100    Encouraged heart healthy diet, increase exercise, avoid trans fats, consider a krill oil cap daily      Relevant Medications   lisinopril-hydrochlorothiazide  (PRINZIDE,ZESTORETIC) 10-12.5 MG tablet   Other Relevant Orders   Lipid panel   Comprehensive metabolic panel   Hypothyroidism   Relevant Medications   levothyroxine (SYNTHROID, LEVOTHROID) 75 MCG tablet   Other Relevant Orders   TSH   Preventative health care - Primary    ghm utd Check labs See AVS         I have discontinued Russell Brown "DON"'s aspirin. I have also changed his dutasteride and lisinopril-hydrochlorothiazide. Additionally, I am having him start on sertraline. Lastly, I am having him maintain his fish oil-omega-3 fatty acids, Calcium Carbonate-Vitamin D, multivitamin, albuterol, ipratropium, buPROPion, omeprazole, AMBULATORY NON FORMULARY MEDICATION, atorvastatin, BREO ELLIPTA, and levothyroxine. We will continue to administer sodium chloride.  Meds ordered this encounter  Medications  . dutasteride (AVODART) 0.5 MG capsule    Sig: Take 1 capsule (  0.5 mg total) by mouth daily.    Dispense:  90 capsule    Refill:  3  . levothyroxine (SYNTHROID, LEVOTHROID) 75 MCG tablet    Sig: TAKE 1 TABLET BY MOUTH  DAILY BEFORE BREAKFAST    Dispense:  90 tablet    Refill:  3  . lisinopril-hydrochlorothiazide (PRINZIDE,ZESTORETIC) 10-12.5 MG tablet    Sig: Take 1 tablet by mouth daily.    Dispense:  90 tablet    Refill:  1  . sertraline (ZOLOFT) 25 MG tablet    Sig: Take 1 tablet (25 mg total) by mouth daily.    Dispense:  30 tablet    Refill:  1    CMA served as scribe during this visit. History, Physical and Plan performed by medical provider. Documentation and orders reviewed and attested to.  Ann Held, DO

## 2017-12-13 NOTE — Patient Instructions (Signed)
Preventive Care 72 Years and Older, Male Preventive care refers to lifestyle choices and visits with your health care provider that can promote health and wellness. What does preventive care include?  A yearly physical exam. This is also called an annual well check.  Dental exams once or twice a year.  Routine eye exams. Ask your health care provider how often you should have your eyes checked.  Personal lifestyle choices, including: ? Daily care of your teeth and gums. ? Regular physical activity. ? Eating a healthy diet. ? Avoiding tobacco and drug use. ? Limiting alcohol use. ? Practicing safe sex. ? Taking low doses of aspirin every day. ? Taking vitamin and mineral supplements as recommended by your health care provider. What happens during an annual well check? The services and screenings done by your health care provider during your annual well check will depend on your age, overall health, lifestyle risk factors, and family history of disease. Counseling Your health care provider may ask you questions about your:  Alcohol use.  Tobacco use.  Drug use.  Emotional well-being.  Home and relationship well-being.  Sexual activity.  Eating habits.  History of falls.  Memory and ability to understand (cognition).  Work and work environment.  Screening You may have the following tests or measurements:  Height, weight, and BMI.  Blood pressure.  Lipid and cholesterol levels. These may be checked every 5 years, or more frequently if you are over 50 years old.  Skin check.  Lung cancer screening. You may have this screening every year starting at age 55 if you have a 30-pack-year history of smoking and currently smoke or have quit within the past 15 years.  Fecal occult blood test (FOBT) of the stool. You may have this test every year starting at age 50.  Flexible sigmoidoscopy or colonoscopy. You may have a sigmoidoscopy every 5 years or a colonoscopy every 10  years starting at age 50.  Prostate cancer screening. Recommendations will vary depending on your family history and other risks.  Hepatitis C blood test.  Hepatitis B blood test.  Sexually transmitted disease (STD) testing.  Diabetes screening. This is done by checking your blood sugar (glucose) after you have not eaten for a while (fasting). You may have this done every 1-3 years.  Abdominal aortic aneurysm (AAA) screening. You may need this if you are a current or former smoker.  Osteoporosis. You may be screened starting at age 70 if you are at high risk.  Talk with your health care provider about your test results, treatment options, and if necessary, the need for more tests. Vaccines Your health care provider may recommend certain vaccines, such as:  Influenza vaccine. This is recommended every year.  Tetanus, diphtheria, and acellular pertussis (Tdap, Td) vaccine. You may need a Td booster every 10 years.  Varicella vaccine. You may need this if you have not been vaccinated.  Zoster vaccine. You may need this after age 60.  Measles, mumps, and rubella (MMR) vaccine. You may need at least one dose of MMR if you were born in 1957 or later. You may also need a second dose.  Pneumococcal 13-valent conjugate (PCV13) vaccine. One dose is recommended after age 65.  Pneumococcal polysaccharide (PPSV23) vaccine. One dose is recommended after age 65.  Meningococcal vaccine. You may need this if you have certain conditions.  Hepatitis A vaccine. You may need this if you have certain conditions or if you travel or work in places where you   may be exposed to hepatitis A.  Hepatitis B vaccine. You may need this if you have certain conditions or if you travel or work in places where you may be exposed to hepatitis B.  Haemophilus influenzae type b (Hib) vaccine. You may need this if you have certain risk factors.  Talk to your health care provider about which screenings and vaccines  you need and how often you need them. This information is not intended to replace advice given to you by your health care provider. Make sure you discuss any questions you have with your health care provider. Document Released: 07/04/2015 Document Revised: 02/25/2016 Document Reviewed: 04/08/2015 Elsevier Interactive Patient Education  2018 [employer]  

## 2017-12-14 NOTE — Assessment & Plan Note (Signed)
Well controlled, no changes to meds. Encouraged heart healthy diet such as the DASH diet and exercise as tolerated.  °

## 2017-12-14 NOTE — Assessment & Plan Note (Signed)
Per u rology 

## 2017-12-14 NOTE — Assessment & Plan Note (Signed)
ghm utd Check labs See AVS 

## 2017-12-14 NOTE — Assessment & Plan Note (Signed)
Stable con't meds 

## 2017-12-14 NOTE — Assessment & Plan Note (Signed)
Encouraged heart healthy diet, increase exercise, avoid trans fats, consider a krill oil cap daily 

## 2017-12-15 ENCOUNTER — Other Ambulatory Visit (INDEPENDENT_AMBULATORY_CARE_PROVIDER_SITE_OTHER): Payer: Medicare Other

## 2017-12-15 ENCOUNTER — Other Ambulatory Visit: Payer: Medicare Other

## 2017-12-15 DIAGNOSIS — E039 Hypothyroidism, unspecified: Secondary | ICD-10-CM | POA: Diagnosis not present

## 2017-12-15 DIAGNOSIS — E785 Hyperlipidemia, unspecified: Secondary | ICD-10-CM | POA: Diagnosis not present

## 2017-12-15 DIAGNOSIS — I10 Essential (primary) hypertension: Secondary | ICD-10-CM | POA: Diagnosis not present

## 2017-12-15 LAB — LIPID PANEL
CHOLESTEROL: 156 mg/dL (ref 0–200)
HDL: 45.3 mg/dL (ref >39.00)
LDL CALC: 87 mg/dL (ref 0–99)
NonHDL: 110.87
TRIGLYCERIDES: 120 mg/dL (ref 0.0–149.0)
Total CHOL/HDL Ratio: 3
VLDL: 24 mg/dL (ref 0.0–40.0)

## 2017-12-15 LAB — CBC WITH DIFFERENTIAL/PLATELET
Basophils Absolute: 0 10*3/uL (ref 0.0–0.1)
Basophils Relative: 0.5 % (ref 0.0–3.0)
EOS ABS: 0.3 10*3/uL (ref 0.0–0.7)
Eosinophils Relative: 3.6 % (ref 0.0–5.0)
HCT: 46.2 % (ref 39.0–52.0)
HEMOGLOBIN: 15.7 g/dL (ref 13.0–17.0)
LYMPHS ABS: 1.7 10*3/uL (ref 0.7–4.0)
Lymphocytes Relative: 23.3 % (ref 12.0–46.0)
MCHC: 33.9 g/dL (ref 30.0–36.0)
MCV: 90.4 fl (ref 78.0–100.0)
MONO ABS: 0.6 10*3/uL (ref 0.1–1.0)
Monocytes Relative: 9 % (ref 3.0–12.0)
NEUTROS PCT: 63.6 % (ref 43.0–77.0)
Neutro Abs: 4.5 10*3/uL (ref 1.4–7.7)
Platelets: 226 10*3/uL (ref 150.0–400.0)
RBC: 5.11 Mil/uL (ref 4.22–5.81)
RDW: 13.3 % (ref 11.5–15.5)
WBC: 7.1 10*3/uL (ref 4.0–10.5)

## 2017-12-15 LAB — COMPREHENSIVE METABOLIC PANEL
ALBUMIN: 4.3 g/dL (ref 3.5–5.2)
ALK PHOS: 62 U/L (ref 39–117)
ALT: 19 U/L (ref 0–53)
AST: 14 U/L (ref 0–37)
BILIRUBIN TOTAL: 0.5 mg/dL (ref 0.2–1.2)
BUN: 17 mg/dL (ref 6–23)
CO2: 31 mEq/L (ref 19–32)
CREATININE: 1.08 mg/dL (ref 0.40–1.50)
Calcium: 9.1 mg/dL (ref 8.4–10.5)
Chloride: 103 mEq/L (ref 96–112)
GFR: 71.43 mL/min (ref >60.00)
Glucose, Bld: 111 mg/dL — ABNORMAL HIGH (ref 70–99)
Potassium: 4.5 mEq/L (ref 3.5–5.1)
SODIUM: 140 meq/L (ref 135–145)
Total Protein: 6.3 g/dL (ref 6.0–8.3)

## 2017-12-15 LAB — TSH: TSH: 1.37 u[IU]/mL (ref 0.35–4.50)

## 2017-12-19 ENCOUNTER — Other Ambulatory Visit: Payer: Self-pay | Admitting: *Deleted

## 2017-12-19 ENCOUNTER — Encounter: Payer: Self-pay | Admitting: *Deleted

## 2017-12-19 DIAGNOSIS — I10 Essential (primary) hypertension: Secondary | ICD-10-CM

## 2017-12-19 DIAGNOSIS — E785 Hyperlipidemia, unspecified: Secondary | ICD-10-CM

## 2018-01-12 ENCOUNTER — Encounter: Payer: Self-pay | Admitting: Family Medicine

## 2018-01-12 ENCOUNTER — Ambulatory Visit (INDEPENDENT_AMBULATORY_CARE_PROVIDER_SITE_OTHER): Payer: Medicare Other | Admitting: Family Medicine

## 2018-01-12 VITALS — BP 118/70 | HR 69 | Temp 98.3°F | Resp 16 | Ht 73.0 in | Wt 184.4 lb

## 2018-01-12 DIAGNOSIS — F4310 Post-traumatic stress disorder, unspecified: Secondary | ICD-10-CM

## 2018-01-12 MED ORDER — SERTRALINE HCL 50 MG PO TABS
50.0000 mg | ORAL_TABLET | Freq: Every day | ORAL | 1 refills | Status: DC
Start: 2018-01-12 — End: 2018-05-10

## 2018-01-12 NOTE — Progress Notes (Signed)
Patient ID: KARSEN NAKANISHI, male   DOB: 08-14-1945, 72 y.o.   MRN: [id number]     Subjective:  I acted as a Education administrator for Dr. Carollee Herter.  [name], [place]   Patient ID: EUSTACE HUR, male    DOB: 03/06/1946, 72 y.o.   MRN: [id number]  Chief Complaint  Patient presents with  . Post-Traumatic Stress Disorder    follow up    HPI  Patient is in today for follow up PTSD.  He is doing well on current treatment.  No complaints.     Patient Care Team: Carollee Herter, Alferd Apa, DO as PCP - General Festus Aloe, MD as Consulting Physician (Urology) Juanito Doom, MD as Consulting Physician (Pulmonary Disease)   Past Medical History:  Diagnosis Date  . 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 Asthma    pt has albuteral inhaler.  [name] Kitchen COPD (chronic obstructive pulmonary disease) ([place])   . GERD (gastroesophageal reflux disease)   . Hyperlipemia   . Hypertension   . PTSD (post-traumatic stress disorder)   . PTSD (post-traumatic stress disorder)    retired Engineer, structural   . Sleep apnea    mild - but does not wear a c-pap per pt  . Thyroid disease    thyroid nodules - half of thyroid was removed per pt    Past Surgical History:  Procedure Laterality Date  . ABDOMINAL SURGERY    . APPENDECTOMY    . BACK SURGERY     cspine  . CERVICAL SPINE SURGERY    . HAND AMPUTATION    . HERNIA REPAIR    . NISSEN FUNDOPLICATION    . SEPTOPLASTY    . STOMACH SURGERY    . THYROIDECTOMY    . WISDOM TOOTH EXTRACTION      Family History  Problem Relation Age of Onset  . Parkinsonism Mother   . Osteoporosis Mother   . Cancer Father        prostate,  skin  . Hyperlipidemia Father   . Hypertension Father   . Prostate cancer Father   . Asthma Son   . Osteoporosis Paternal Aunt   . Alzheimer's disease Paternal Uncle   . Osteoporosis Maternal Grandmother   . Cancer Maternal Grandmother        lung  . Heart disease Paternal Grandmother        MI  .  Heart disease Paternal Grandfather        MI  . Colon cancer Neg Hx   . Pancreatic cancer Neg Hx   . Stomach cancer Neg Hx   . Esophageal cancer Neg Hx   . Liver cancer Neg Hx   . Rectal cancer Neg Hx     Social History   Socioeconomic History  . Marital status: Married    Spouse name: Not on file  . Number of children: 4  . Years of education: Not on file  . Highest education level: Not on file  Occupational History  . Occupation: retired    Fish farm manager: [employer]  . Financial resource strain: Not on file  . Food insecurity:    Worry: Not on file    Inability: Not on file  . Transportation needs:    Medical: Not on file    Non-medical: Not on file  Tobacco Use  . Smoking status: Former Smoker    Packs/day: 1.00    Years: 35.00  Pack years: 35.00    Types: Cigarettes    Last attempt to quit: 08/18/1996    Years since quitting: 21.4  . Smokeless tobacco: Never Used  Substance and Sexual Activity  . Alcohol use: Yes    Alcohol/week: 0.6 oz    Types: 1 Cans of beer per week    Comment: DAILY 1 beer per day  . Drug use: No  . Sexual activity: Yes    Partners: Female  Lifestyle  . Physical activity:    Days per week: Not on file    Minutes per session: Not on file  . Stress: Not on file  Relationships  . Social connections:    Talks on phone: Not on file    Gets together: Not on file    Attends religious service: Not on file    Active member of club or organization: Not on file    Attends meetings of clubs or organizations: Not on file    Relationship status: Not on file  . Intimate partner violence:    Fear of current or ex partner: Not on file    Emotionally abused: Not on file    Physically abused: Not on file    Forced sexual activity: Not on file  Other Topics Concern  . Not on file  Social History Narrative   Exercise- weights and walk on treadmill    Outpatient Medications Prior to Visit  Medication Sig Dispense Refill  .  albuterol (PROAIR HFA) 108 (90 Base) MCG/ACT inhaler Inhale 2 puffs into the lungs every 6 (six) hours as needed for wheezing or shortness of breath. 1 Inhaler 3  . AMBULATORY NON FORMULARY MEDICATION Medication Name: Gi cocktail 5-10 MLs every 4-6 hours as needed 450 mL 0  . atorvastatin (LIPITOR) 10 MG tablet TAKE 1 TABLET BY MOUTH  DAILY AT 6 PM 90 tablet 2  . BREO ELLIPTA 200-25 MCG/INH AEPB USE 1 INHALATION DAILY 180 each 0  . dutasteride (AVODART) 0.5 MG capsule Take 1 capsule (0.5 mg total) by mouth daily. 90 capsule 3  . fish oil-omega-3 fatty acids 1000 MG capsule Take 1 g by mouth daily.     [name] Kitchen ipratropium (ATROVENT) 0.03 % nasal spray Place 2 sprays into both nostrils 3 (three) times daily as needed for rhinitis. 90 mL 3  . levothyroxine (SYNTHROID, LEVOTHROID) 75 MCG tablet TAKE 1 TABLET BY MOUTH  DAILY BEFORE BREAKFAST 90 tablet 3  . lisinopril-hydrochlorothiazide (PRINZIDE,ZESTORETIC) 10-12.5 MG tablet Take 1 tablet by mouth daily. 90 tablet 1  . Multiple Vitamin (MULTIVITAMIN) tablet Take 1 tablet by mouth daily.    [name] Kitchen omeprazole (PRILOSEC) 40 MG capsule Take 1 capsule (40 mg total) by mouth daily. 90 capsule 3  . buPROPion (WELLBUTRIN XL) 300 MG 24 hr tablet Take 1 tablet (300 mg total) by mouth daily. 90 tablet 2  . sertraline (ZOLOFT) 25 MG tablet Take 1 tablet (25 mg total) by mouth daily. 30 tablet 1  . Calcium Carbonate-Vitamin D (CALCIUM 600/VITAMIN D) 600-400 MG-UNIT per chew tablet Chew 1 tablet by mouth daily.     Facility-Administered Medications Prior to Visit  Medication Dose Route Frequency Provider Last Rate Last Dose  . 0.9 %  sodium chloride infusion  500 mL Intravenous Continuous Irene Shipper, MD        Allergies  Allergen Reactions  . Codeine Anxiety    Review of Systems  Constitutional: Negative for fever and malaise/fatigue.  HENT: Negative for congestion.   Eyes: Negative for  blurred vision.  Respiratory: Negative for cough and shortness of breath.     Cardiovascular: Negative for chest pain, palpitations and leg swelling.  Gastrointestinal: Negative for vomiting.  Musculoskeletal: Negative for back pain.  Skin: Negative for rash.  Neurological: Negative for loss of consciousness and headaches.       Objective:    Physical Exam  Constitutional: He is oriented to person, place, and time. Vital signs are normal. He appears well-developed and well-nourished. He is sleeping.  HENT:  Head: Normocephalic and atraumatic.  Mouth/Throat: Oropharynx is clear and moist.  Eyes: Pupils are equal, round, and reactive to light. EOM are normal.  Neck: Normal range of motion. Neck supple. No thyromegaly present.  Cardiovascular: Normal rate and regular rhythm.  No murmur heard. Pulmonary/Chest: Effort normal and breath sounds normal. No respiratory distress. He has no wheezes. He has no rales. He exhibits no tenderness.  Musculoskeletal: He exhibits no edema or tenderness.  Neurological: He is alert and oriented to person, place, and time.  Skin: Skin is warm and dry.  Psychiatric: He has a normal mood and affect. His behavior is normal. Judgment and thought content normal.  Nursing note and vitals reviewed.   BP 118/70 (BP Location: Right Arm, Cuff Size: Normal)   Pulse 69   Temp 98.3 F (36.8 C) (Oral)   Resp 16   Ht 6\' 1"  (1.854 m)   Wt 184 lb 6.4 oz (83.6 kg)   SpO2 97%   BMI 24.33 kg/m  Wt Readings from Last 3 Encounters:  01/12/18 184 lb 6.4 oz (83.6 kg)  12/13/17 184 lb (83.5 kg)  09/19/17 194 lb (88 kg)   BP Readings from Last 3 Encounters:  01/12/18 118/70  12/13/17 114/61  09/19/17 114/67     Immunization History  Administered Date(s) Administered  . H1N1 05/28/2008  . Influenza Whole 04/28/2007, 03/26/2008, 04/16/2009, 05/07/2010, 04/10/2011, 04/13/2012  . Influenza, High Dose Seasonal PF 03/05/2014, 03/17/2015  . Influenza, Seasonal, Injecte, Preservative Fre 03/06/2013  . Influenza,inj,Quad PF,6+ Mos 03/04/2016   . Influenza-Unspecified 03/22/2011  . Pneumococcal Conjugate-13 12/05/2014  . Pneumococcal Polysaccharide-23 04/11/2008, 04/18/2013  . Td 05/22/2009  . Zoster 05/22/2009  . Zoster Recombinat (Shingrix) 03/21/2017, 09/10/2017    Health Maintenance  Topic Date Due  . INFLUENZA VACCINE  01/19/2018  . TETANUS/TDAP  05/23/2019  . COLONOSCOPY  05/11/2023  . Hepatitis C Screening  Completed  . PNA vac Low Risk Adult  Completed    Lab Results  Component Value Date   WBC 7.1 12/15/2017   HGB 15.7 12/15/2017   HCT 46.2 12/15/2017   PLT 226.0 12/15/2017   GLUCOSE 111 (H) 12/15/2017   CHOL 156 12/15/2017   TRIG 120.0 12/15/2017   HDL 45.30 12/15/2017   LDLDIRECT 105.5 08/19/2011   LDLCALC 87 12/15/2017   ALT 19 12/15/2017   AST 14 12/15/2017   NA 140 12/15/2017   K 4.5 12/15/2017   CL 103 12/15/2017   CREATININE 1.08 12/15/2017   BUN 17 12/15/2017   CO2 31 12/15/2017   TSH 1.37 12/15/2017   PSA 6.92 (H) 05/20/2008   MICROALBUR <0.7 12/05/2014    Lab Results  Component Value Date   TSH 1.37 12/15/2017   Lab Results  Component Value Date   WBC 7.1 12/15/2017   HGB 15.7 12/15/2017   HCT 46.2 12/15/2017   MCV 90.4 12/15/2017   PLT 226.0 12/15/2017   Lab Results  Component Value Date   NA 140 12/15/2017   K 4.5  12/15/2017   CO2 31 12/15/2017   GLUCOSE 111 (H) 12/15/2017   BUN 17 12/15/2017   CREATININE 1.08 12/15/2017   BILITOT 0.5 12/15/2017   ALKPHOS 62 12/15/2017   AST 14 12/15/2017   ALT 19 12/15/2017   PROT 6.3 12/15/2017   ALBUMIN 4.3 12/15/2017   CALCIUM 9.1 12/15/2017   GFR 71.43 12/15/2017   Lab Results  Component Value Date   CHOL 156 12/15/2017   Lab Results  Component Value Date   HDL 45.30 12/15/2017   Lab Results  Component Value Date   LDLCALC 87 12/15/2017   Lab Results  Component Value Date   TRIG 120.0 12/15/2017   Lab Results  Component Value Date   CHOLHDL 3 12/15/2017   No results found for: HGBA1C       Assessment &  Plan:   Problem List Items Addressed This Visit    None    Visit Diagnoses    PTSD (post-traumatic stress disorder)    -  Primary   Relevant Medications   sertraline (ZOLOFT) 50 MG tablet    stable rto 6 months or sooner prn   I have discontinued Henrene Dodge "DON"'s buPROPion and sertraline. I am also having him start on sertraline. Additionally, I am having him maintain his fish oil-omega-3 fatty acids, Calcium Carbonate-Vitamin D, multivitamin, albuterol, ipratropium, omeprazole, AMBULATORY NON FORMULARY MEDICATION, atorvastatin, BREO ELLIPTA, dutasteride, levothyroxine, and lisinopril-hydrochlorothiazide. We will continue to administer sodium chloride.  Meds ordered this encounter  Medications  . sertraline (ZOLOFT) 50 MG tablet    Sig: Take 1 tablet (50 mg total) by mouth daily.    Dispense:  90 tablet    Refill:  1    CMA served as Education administrator during this visit. History, Physical and Plan performed by medical provider. Documentation and orders reviewed and attested to.  Ann Held, DO

## 2018-01-12 NOTE — Patient Instructions (Signed)
Living With Post-Traumatic Stress Disorder If you have been diagnosed with post-traumatic stress disorder (PTSD), you may be relieved that you now know why you have felt or behaved a certain way. Still, you may feel overwhelmed about the treatment ahead. You may also wonder how to get the support you need and how to deal with the condition day-to-day. If you are living with PTSD, there are ways to help you recover from it and manage your symptoms. How to manage lifestyle changes Managing stress Stress is your body's reaction to life changes and events, both good and bad. Stress can make PTSD worse. Take the following steps to cope with stress:  Talk with your health care provider or a counselor if you would like to learn more about techniques to reduce your stress. He or she may suggest some stress reduction techniques such as: ? Muscle relaxation exercises. ? Regular exercise. ? Meditation, yoga, or other mind-body exercises. ? Breathing exercises. ? Listening to quiet music. ? Spending time outside.  Maintain a healthy lifestyle. Eat a healthy diet, exercise regularly, get plenty of sleep, and take time to relax.  Spend time with others. Talk with them about how you are feeling and what kind of support you need. Try to not isolate yourself, even though you may feel like doing that. Isolating yourself can delay your recovery.  Do activities and hobbies that you enjoy.  Pace yourself when doing stressful things. Take breaks, and reward yourself when you finish. Make sure that you do not overload your schedule.  Medicines Your health care provider may suggest certain medicines if he or she feels that they will help to improve your condition. Antidepressants or antipsychotic medicines may be used to treat PTSD. Avoid using alcohol and other substances that may prevent your medicines from working properly (may interact). It is also important to:  Talk with your pharmacist or health care  provider about all medicines that you take, their possible side effects, and which medicines are safe to take together.  Make it your goal to take part in all treatment decisions (shared decision-making). Ask about possible side effects of medicines that your health care provider recommends, and tell him or her how you feel about having those side effects. It is best if shared decision-making with your health care provider is part of your total treatment plan.  If your health care provider prescribes a medicine, you may not notice the full benefits of it for 4-8 weeks. Most people who are treated for PTSD need to take medicine for at least 6-12 months after they feel better. If you are taking medicines as part of your treatment, do not stop taking medicines before you ask your health care provider if it is safe to stop. You may need to have the medicine slowly decreased (tapered) over time to lower the risk of harmful side effects. Relationships Many people who have PTSD have difficulty trusting others. Make an effort to:  Take risks and develop trust with close friends and family members. Developing trust in others can help you feel safe and connect you with emotional support.  Be open and honest about your feelings.  Try to have fun and relax in safe spaces, such as with friends and family.  Think about going to couples counseling, family education classes, or family therapy. Your loved ones may not always know how to be supportive. Therapy can be helpful for everyone.  How to recognize changes in your condition Be aware of your   symptoms and how often you have them. The following symptoms mean that you need to seek help for your PTSD:  You feel suspicious and angry.  You have repeated flashbacks.  You avoid going out or being with others.  You have an increasing number of fights with close friends or family members, such as your spouse.  You have thoughts about hurting yourself or  others.  You cannot get relief from feelings of depression or anxiety.  Where to find support Talking to others  Explain that PTSD is a mental health problem. It is something that a person can develop after experiencing or seeing a life-threatening event. Tell them that PTSD makes you feel stress like you did during the event.  Talk to your loved ones about the symptoms you have. Also tell them what things or situations can cause symptoms to start (are triggers for you).  Assure your loved ones that there are treatments to help PTSD. Discuss possibly seeking family therapy or couples therapy.  If you are worried or fearful about seeking treatment, ask for support. Finances Not all insurance plans cover mental health care, so it is important to check with your insurance carrier. If paying for co-pays or counseling services is a problem, search for a local or county mental health care center. Public mental health care services may be offered there at a low cost or no cost when you are not able to see a private health care provider. If you are a veteran, contact a local veterans organization or veterans hospital for more information. If you are taking medicine for PTSD, you may be able to get the genericform, which may be less expensive than brand-name medicine. Some makers of prescription medicines also offer help to patients who cannot afford the medicines that they need. Community resources  Find a support group in your community. Often, groups are available for military veterans, trauma victims, and family members or caregivers.  Look into volunteer opportunities. Taking part in these can help you feel more connected to your community.  Contact a local organization to find out if you are eligible for a service dog.  Keep daily contact with at least one trusted friend or family member. Follow these instructions at home: Lifestyle  Exercise regularly. Try to do 30 or more minutes of  physical activity on most days of the week.  Try to get 7-9 hours of sleep each night. To help with sleep: ? Keep your bedroom cool and dark. ? Do not eat a heavy meal during the hour before you go to bed. ? Do not drink alcohol or caffeinated drinks before bed. ? Avoid screen time before bedtime. This means avoiding use of your TV, computer, tablet, and cell phone.  Avoid using alcohol or drugs.  Practice self-soothing skills and use them daily.  Try to have fun and seek humor in your life. General instructions  If your PTSD is affecting your marriage or family, seek help from a family therapist.  Take over-the-counter and prescription medicines only as told by your health care provider.  Make sure to let all of your health care providers know that you have PTSD. This is especially important if you are having surgery or need to be admitted to the hospital.  Keep all follow-up visits as told by your health care providers. This is important. Where to find more information: Go to this website to find more information about PTSD, treatment for PTSD, and how to get support:  National   Center for PTSD: www.ptsd.va.gov  Contact a health care provider if:  Your symptoms get worse or they do not get better. Get help right away if:  You have thoughts about hurting yourself or others. If you ever feel like you may hurt yourself or others, or have thoughts about taking your own life, get help right away. You can go to your nearest emergency department or call:  Your local emergency services (911 in the [place]  A suicide crisis helpline, such as the National Suicide Prevention Lifeline at 1-800-273-8255. This is open 24-hours a day.  Summary  If you are living with PTSD, there are ways to help you recover from it and manage your symptoms.  Find supportive environments and people who understand PTSD. Spend time in those places, and maintain contact with those people.  Work with your  health care team to create a management plan that includes counseling, stress management techniques, and healthy lifestyle habits. This information is not intended to replace advice given to you by your health care provider. Make sure you discuss any questions you have with your health care provider. Document Released: 10/07/2016 Document Revised: 10/07/2016 Document Reviewed: 10/07/2016 Elsevier Interactive Patient Education  2018 [employer]  

## 2018-01-17 ENCOUNTER — Other Ambulatory Visit: Payer: Self-pay | Admitting: Adult Health

## 2018-01-19 ENCOUNTER — Encounter: Payer: Self-pay | Admitting: Adult Health

## 2018-01-19 ENCOUNTER — Ambulatory Visit (INDEPENDENT_AMBULATORY_CARE_PROVIDER_SITE_OTHER): Payer: Medicare Other | Admitting: Adult Health

## 2018-01-19 DIAGNOSIS — J449 Chronic obstructive pulmonary disease, unspecified: Secondary | ICD-10-CM

## 2018-01-19 NOTE — Assessment & Plan Note (Signed)
Doing well on Dulera . Activity tolerance at baseline  No cough on ACE  Vaccine utd   Plan  Patient Instructions  Continue on BREO 1 puff daily, rinse after use  Advance activity as tolerated  Follow up in 1 year at [facility] with Dr. Lake Bells or [place] NP .  Please contact office for sooner follow up if symptoms do not improve or worsen or seek emergency care

## 2018-01-19 NOTE — Progress Notes (Signed)
Reviewed, agree 

## 2018-01-19 NOTE — Progress Notes (Signed)
@Patient  ID: Russell Brown, male    DOB: [birth year], 72 y.o.   MRN: [id number]  No chief complaint on file.   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  FENO 21ppm  01/19/2018 Follow up : COPD  Patient presents for a one-year follow-up.  Patient has underlying moderate COPD.  He remains on Breo  daily.  He denies any increased flare of cough or wheezing.  Does get short of breath with inclines. Can walk long distances.   He denies any chest pain orthopnea or increased edema. Chest x-ray one year ago showed stable severe emphysema and chronic pleural-parenchymal scarring Pneumovax and Prevnar 13 vaccines are up-to-date Remains very active. Exercises  At [employer] 2 days /week.  He volunteers a lot .        Allergies  Allergen Reactions  . Codeine Anxiety    Immunization History  Administered Date(s) Administered  . H1N1 05/28/2008  . Influenza Whole 04/28/2007, 03/26/2008, 04/16/2009, 05/07/2010, 04/10/2011, 04/13/2012  . Influenza, High Dose Seasonal PF 03/05/2014, 03/17/2015  . Influenza, Seasonal, Injecte, Preservative Fre 03/06/2013  . Influenza,inj,Quad PF,6+ Mos 03/04/2016  . Influenza-Unspecified 03/22/2011  . Pneumococcal Conjugate-13 12/05/2014  . Pneumococcal Polysaccharide-23 04/11/2008, 04/18/2013  . Td 05/22/2009  . Zoster 05/22/2009  . Zoster Recombinat (Shingrix) 03/21/2017, 09/10/2017    Past Medical History:  Diagnosis Date  . 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 Asthma    pt has albuteral inhaler.  [name] Kitchen COPD (chronic obstructive pulmonary disease) ([place])   . GERD (gastroesophageal reflux disease)   . Hyperlipemia   . Hypertension   . PTSD (post-traumatic stress disorder)   .  PTSD (post-traumatic stress disorder)    retired Engineer, structural   . Sleep apnea    mild - but does not wear a c-pap per pt  . Thyroid disease    thyroid nodules - half of thyroid was removed per pt    Tobacco History: Social History   Tobacco Use  Smoking Status Former Smoker  . Packs/day: 1.00  . Years: 35.00  . Pack years: 35.00  . Types: Cigarettes  . Last attempt to quit: 08/18/1996  . Years since quitting: 21.4  Smokeless Tobacco Never Used   Counseling given: Not Answered   Outpatient Medications Prior to Visit  Medication Sig Dispense Refill  . albuterol (PROAIR HFA) 108 (90 Base) MCG/ACT inhaler Inhale 2 puffs into the lungs every 6 (six) hours as needed for wheezing or shortness of breath. 1 Inhaler 3  . AMBULATORY NON FORMULARY MEDICATION Medication Name: Gi cocktail 5-10 MLs every 4-6 hours as needed 450 mL 0  . atorvastatin (LIPITOR) 10 MG tablet TAKE 1 TABLET BY MOUTH  DAILY AT 6 PM 90 tablet 2  . BREO ELLIPTA 200-25 MCG/INH AEPB USE 1 INHALATION DAILY 60 each 0  . dutasteride (AVODART) 0.5 MG capsule Take 1 capsule (0.5 mg total) by mouth daily. 90 capsule 3  . fish oil-omega-3 fatty acids 1000 MG capsule Take 1 g by mouth daily.     [name] Kitchen ipratropium (ATROVENT) 0.03 % nasal spray Place 2 sprays into both nostrils 3 (three) times daily as needed for rhinitis. 90 mL 3  . levothyroxine (SYNTHROID, LEVOTHROID) 75 MCG tablet  TAKE 1 TABLET BY MOUTH  DAILY BEFORE BREAKFAST 90 tablet 3  . lisinopril-hydrochlorothiazide (PRINZIDE,ZESTORETIC) 10-12.5 MG tablet Take 1 tablet by mouth daily. 90 tablet 1  . Multiple Vitamin (MULTIVITAMIN) tablet Take 1 tablet by mouth daily.    [name] Kitchen omeprazole (PRILOSEC) 40 MG capsule Take 1 capsule (40 mg total) by mouth daily. 90 capsule 3  . sertraline (ZOLOFT) 50 MG tablet Take 1 tablet (50 mg total) by mouth daily. 90 tablet 1  . Calcium Carbonate-Vitamin D (CALCIUM 600/VITAMIN D) 600-400 MG-UNIT per chew tablet Chew 1 tablet by mouth daily.      Facility-Administered Medications Prior to Visit  Medication Dose Route Frequency Provider Last Rate Last Dose  . 0.9 %  sodium chloride infusion  500 mL Intravenous Continuous Irene Shipper, MD         Review of Systems  Constitutional:   No  weight loss, night sweats,  Fevers, chills, fatigue, or  lassitude.  HEENT:   No headaches,  Difficulty swallowing,  Tooth/dental problems, or  Sore throat,                No sneezing, itching, ear ache, nasal congestion, post nasal drip,   CV:  No chest pain,  Orthopnea, PND, swelling in lower extremities, anasarca, dizziness, palpitations, syncope.   GI  No heartburn, indigestion, abdominal pain, nausea, vomiting, diarrhea, change in bowel habits, loss of appetite, bloody stools.   Resp:    No excess mucus, no productive cough,  No non-productive cough,  No coughing up of blood.  No change in color of mucus.  No wheezing.  No chest wall deformity  Skin: no rash or lesions.  GU: no dysuria, change in color of urine, no urgency or frequency.  No flank pain, no hematuria   MS:  No joint pain or swelling.  No decreased range of motion.  No back pain.    Physical Exam  BP 126/70 (BP Location: Left Arm, Cuff Size: Normal)   Pulse 67   Ht 6\' 1"  (1.854 m)   Wt 187 lb (84.8 kg)   SpO2 95%   BMI 24.67 kg/m   GEN: A/Ox3; pleasant , NAD, elderly    HEENT:  [place]/AT,  EACs-clear, TMs-wnl, NOSE-clear, THROAT-clear, no lesions, no postnasal drip or exudate noted.   NECK:  Supple w/ fair ROM; no JVD; normal carotid impulses w/o bruits; no thyromegaly or nodules palpated; no lymphadenopathy.    RESP  Decreased BS in bases . no accessory muscle use, no dullness to percussion  CARD:  RRR, no m/r/g, no peripheral edema, pulses intact, no cyanosis or clubbing.  GI:   Soft & nt; nml bowel sounds; no organomegaly or masses detected.   Musco: Warm bil, no deformities or joint swelling noted.  Left  -finger amputee   Neuro: alert, no focal  deficits noted.    Skin: Warm, no lesions or rashes    Lab Results:  CBC    Component Value Date/Time   WBC 7.1 12/15/2017 0835   RBC 5.11 12/15/2017 0835   HGB 15.7 12/15/2017 0835   HCT 46.2 12/15/2017 0835   PLT 226.0 12/15/2017 0835   MCV 90.4 12/15/2017 0835   MCH 29.8 02/20/2012 1257   MCHC 33.9 12/15/2017 0835   RDW 13.3 12/15/2017 0835   LYMPHSABS 1.7 12/15/2017 0835   MONOABS 0.6 12/15/2017 0835   EOSABS 0.3 12/15/2017 0835   BASOSABS 0.0 12/15/2017 0835    BMET    Component Value Date/Time  NA 140 12/15/2017 0835   K 4.5 12/15/2017 0835   CL 103 12/15/2017 0835   CO2 31 12/15/2017 0835   GLUCOSE 111 (H) 12/15/2017 0835   GLUCOSE 98 04/22/2006 1057   BUN 17 12/15/2017 0835   CREATININE 1.08 12/15/2017 0835   CALCIUM 9.1 12/15/2017 0835   GFRNONAA 87 (L) 02/20/2012 1257   GFRAA >90 02/20/2012 1257    BNP No results found for: BNP  ProBNP    Component Value Date/Time   PROBNP 91.2 02/20/2012 1257    Imaging: No results found.   Assessment & Plan:   COPD with asthma ([place]) Doing well on Dulera . Activity tolerance at baseline  No cough on ACE  Vaccine utd   Plan  Patient Instructions  Continue on BREO 1 puff daily, rinse after use  Advance activity as tolerated  Follow up in 1 year at [facility] with Dr. Lake Bells or Ezrael Sam NP .  Please contact office for sooner follow up if symptoms do not improve or worsen or seek emergency care           Rexene Edison, NP 01/19/2018

## 2018-01-19 NOTE — Patient Instructions (Signed)
Continue on BREO 1 puff daily, rinse after use  Advance activity as tolerated  Follow up in 1 year at [facility] with Dr. Lake Bells or Lorri Fukuhara NP .  Please contact office for sooner follow up if symptoms do not improve or worsen or seek emergency care

## 2018-02-14 ENCOUNTER — Other Ambulatory Visit: Payer: Self-pay

## 2018-02-14 MED ORDER — FLUTICASONE FUROATE-VILANTEROL 200-25 MCG/INH IN AEPB: 1.0000 | INHALATION_SPRAY | Freq: Every day | RESPIRATORY_TRACT | 3 refills | Status: DC

## 2018-02-20 ENCOUNTER — Encounter (HOSPITAL_BASED_OUTPATIENT_CLINIC_OR_DEPARTMENT_OTHER): Payer: Self-pay | Admitting: Emergency Medicine

## 2018-02-20 ENCOUNTER — Other Ambulatory Visit: Payer: Self-pay

## 2018-02-20 ENCOUNTER — Emergency Department (HOSPITAL_BASED_OUTPATIENT_CLINIC_OR_DEPARTMENT_OTHER): Payer: Medicare Other

## 2018-02-20 ENCOUNTER — Inpatient Hospital Stay (HOSPITAL_BASED_OUTPATIENT_CLINIC_OR_DEPARTMENT_OTHER)
Admission: EM | Admit: 2018-02-20 | Discharge: 2018-02-22 | DRG: 287 | Disposition: A | Discharge status: Home / Self Care | Payer: Medicare Other | Attending: Cardiovascular Disease | Admitting: Cardiovascular Disease

## 2018-02-20 DIAGNOSIS — F4310 Post-traumatic stress disorder, unspecified: Secondary | ICD-10-CM | POA: Diagnosis present

## 2018-02-20 DIAGNOSIS — Z808 Family history of malignant neoplasm of other organs or systems: Secondary | ICD-10-CM | POA: Diagnosis not present

## 2018-02-20 DIAGNOSIS — M19041 Primary osteoarthritis, right hand: Secondary | ICD-10-CM | POA: Diagnosis present

## 2018-02-20 DIAGNOSIS — Z7951 Long term (current) use of inhaled steroids: Secondary | ICD-10-CM

## 2018-02-20 DIAGNOSIS — I25110 Atherosclerotic heart disease of native coronary artery with unstable angina pectoris: Secondary | ICD-10-CM | POA: Diagnosis present

## 2018-02-20 DIAGNOSIS — J449 Chronic obstructive pulmonary disease, unspecified: Secondary | ICD-10-CM | POA: Diagnosis present

## 2018-02-20 DIAGNOSIS — I10 Essential (primary) hypertension: Secondary | ICD-10-CM | POA: Diagnosis present

## 2018-02-20 DIAGNOSIS — Z801 Family history of malignant neoplasm of trachea, bronchus and lung: Secondary | ICD-10-CM | POA: Diagnosis not present

## 2018-02-20 DIAGNOSIS — G4733 Obstructive sleep apnea (adult) (pediatric): Secondary | ICD-10-CM | POA: Diagnosis present

## 2018-02-20 DIAGNOSIS — K08409 Partial loss of teeth, unspecified cause, unspecified class: Secondary | ICD-10-CM | POA: Diagnosis present

## 2018-02-20 DIAGNOSIS — E890 Postprocedural hypothyroidism: Secondary | ICD-10-CM | POA: Diagnosis present

## 2018-02-20 DIAGNOSIS — R079 Chest pain, unspecified: Secondary | ICD-10-CM | POA: Diagnosis present

## 2018-02-20 DIAGNOSIS — E785 Hyperlipidemia, unspecified: Secondary | ICD-10-CM | POA: Diagnosis present

## 2018-02-20 DIAGNOSIS — Z8042 Family history of malignant neoplasm of prostate: Secondary | ICD-10-CM | POA: Diagnosis not present

## 2018-02-20 DIAGNOSIS — K219 Gastro-esophageal reflux disease without esophagitis: Secondary | ICD-10-CM | POA: Diagnosis present

## 2018-02-20 DIAGNOSIS — Z8701 Personal history of pneumonia (recurrent): Secondary | ICD-10-CM | POA: Diagnosis not present

## 2018-02-20 DIAGNOSIS — M19032 Primary osteoarthritis, left wrist: Secondary | ICD-10-CM | POA: Diagnosis present

## 2018-02-20 DIAGNOSIS — Z79899 Other long term (current) drug therapy: Secondary | ICD-10-CM | POA: Diagnosis not present

## 2018-02-20 DIAGNOSIS — Z7952 Long term (current) use of systemic steroids: Secondary | ICD-10-CM | POA: Diagnosis not present

## 2018-02-20 DIAGNOSIS — Z885 Allergy status to narcotic agent status: Secondary | ICD-10-CM

## 2018-02-20 DIAGNOSIS — Z825 Family history of asthma and other chronic lower respiratory diseases: Secondary | ICD-10-CM | POA: Diagnosis not present

## 2018-02-20 DIAGNOSIS — Z87891 Personal history of nicotine dependence: Secondary | ICD-10-CM | POA: Diagnosis not present

## 2018-02-20 DIAGNOSIS — Z8249 Family history of ischemic heart disease and other diseases of the circulatory system: Secondary | ICD-10-CM | POA: Diagnosis not present

## 2018-02-20 DIAGNOSIS — E039 Hypothyroidism, unspecified: Secondary | ICD-10-CM | POA: Diagnosis present

## 2018-02-20 HISTORY — DX: Hypothyroidism, unspecified: E03.9

## 2018-02-20 HISTORY — DX: Pneumonia, unspecified organism: J18.9

## 2018-02-20 HISTORY — DX: Personal history of other diseases of the digestive system: Z87.19

## 2018-02-20 LAB — CBC
HCT: 48.4 % (ref 39.0–52.0)
Hemoglobin: 16.6 g/dL (ref 13.0–17.0)
MCH: 30.7 pg (ref 26.0–34.0)
MCHC: 34.3 g/dL (ref 30.0–36.0)
MCV: 89.6 fL (ref 78.0–100.0)
PLATELETS: 211 10*3/uL (ref 150–400)
RBC: 5.4 MIL/uL (ref 4.22–5.81)
RDW: 13.8 % (ref 11.5–15.5)
WBC: 14.3 10*3/uL — AB (ref 4.0–10.5)

## 2018-02-20 LAB — HEPARIN LEVEL (UNFRACTIONATED): Heparin Unfractionated: 0.27 IU/mL — ABNORMAL LOW (ref 0.30–0.70)

## 2018-02-20 LAB — HEMOGLOBIN A1C
HEMOGLOBIN A1C: 5.6 % (ref 4.8–5.6)
MEAN PLASMA GLUCOSE: 114.02 mg/dL

## 2018-02-20 LAB — BASIC METABOLIC PANEL
ANION GAP: 11 (ref 5–15)
BUN: 14 mg/dL (ref 8–23)
CALCIUM: 9.3 mg/dL (ref 8.9–10.3)
CO2: 27 mmol/L (ref 22–32)
CREATININE: 1.04 mg/dL (ref 0.61–1.24)
Chloride: 103 mmol/L (ref 98–111)
Glucose, Bld: 108 mg/dL — ABNORMAL HIGH (ref 70–99)
Potassium: 4.2 mmol/L (ref 3.5–5.1)
Sodium: 141 mmol/L (ref 135–145)

## 2018-02-20 LAB — TROPONIN I
Troponin I: <0.03 ng/mL (ref <0.03)
Troponin I: <0.03 ng/mL (ref <0.03)

## 2018-02-20 LAB — TSH: TSH: 0.969 u[IU]/mL (ref 0.350–4.500)

## 2018-02-20 LAB — PROTIME-INR
INR: 1.05
PROTHROMBIN TIME: 13.6 s (ref 11.4–15.2)

## 2018-02-20 LAB — CBG MONITORING, ED: GLUCOSE-CAPILLARY: 100 mg/dL — AB (ref 70–99)

## 2018-02-20 LAB — MRSA PCR SCREENING: MRSA by PCR: NEGATIVE

## 2018-02-20 MED ORDER — SODIUM CHLORIDE 0.9 % WEIGHT BASED INFUSION
1.0000 mL/kg/h | INTRAVENOUS | Status: DC
  Administered 2018-02-21 (×2): 1 mL/kg/h via INTRAVENOUS

## 2018-02-20 MED ORDER — SODIUM CHLORIDE 0.9 % IV BOLUS
500.0000 mL | Freq: Once | INTRAVENOUS | Status: AC
  Administered 2018-02-20: 500 mL via INTRAVENOUS

## 2018-02-20 MED ORDER — OMEGA-3 FATTY ACIDS 1000 MG PO CAPS: 1.0000 g | ORAL_CAPSULE | Freq: Every day | ORAL | Status: DC

## 2018-02-20 MED ORDER — FLUTICASONE FUROATE-VILANTEROL 200-25 MCG/INH IN AEPB
1.0000 | INHALATION_SPRAY | Freq: Every day | RESPIRATORY_TRACT | Status: DC
  Administered 2018-02-21 – 2018-02-22 (×2): 1 via RESPIRATORY_TRACT
  Filled 2018-02-20: qty 28

## 2018-02-20 MED ORDER — IPRATROPIUM BROMIDE 0.06 % NA SOLN
2.0000 | Freq: Three times a day (TID) | NASAL | Status: DC | PRN
  Filled 2018-02-20: qty 15

## 2018-02-20 MED ORDER — ALBUTEROL SULFATE (2.5 MG/3ML) 0.083% IN NEBU: 2.5000 mg | INHALATION_SOLUTION | Freq: Four times a day (QID) | RESPIRATORY_TRACT | Status: DC | PRN

## 2018-02-20 MED ORDER — HEPARIN (PORCINE) IN NACL 100-0.45 UNIT/ML-% IJ SOLN
1150.0000 [IU]/h | INTRAMUSCULAR | Status: DC
  Administered 2018-02-20: 1000 [IU]/h via INTRAVENOUS
  Administered 2018-02-21: 1150 [IU]/h via INTRAVENOUS
  Filled 2018-02-20 (×2): qty 250

## 2018-02-20 MED ORDER — NITROGLYCERIN IN D5W 200-5 MCG/ML-% IV SOLN
0.0000 ug/min | Freq: Once | INTRAVENOUS | Status: AC
  Administered 2018-02-20: 5 ug/min via INTRAVENOUS
  Filled 2018-02-20: qty 250

## 2018-02-20 MED ORDER — ASPIRIN 81 MG PO CHEW
81.0000 mg | CHEWABLE_TABLET | ORAL | Status: AC
  Administered 2018-02-21: 81 mg via ORAL
  Filled 2018-02-20: qty 1

## 2018-02-20 MED ORDER — NITROGLYCERIN 0.4 MG SL SUBL: 0.4000 mg | SUBLINGUAL_TABLET | SUBLINGUAL | Status: DC | PRN

## 2018-02-20 MED ORDER — ONDANSETRON HCL 4 MG/2ML IJ SOLN: 4.0000 mg | Freq: Four times a day (QID) | INTRAMUSCULAR | Status: DC | PRN

## 2018-02-20 MED ORDER — ASPIRIN 81 MG PO CHEW
162.0000 mg | CHEWABLE_TABLET | Freq: Once | ORAL | Status: AC
  Administered 2018-02-20: 162 mg via ORAL
  Filled 2018-02-20: qty 2

## 2018-02-20 MED ORDER — SODIUM CHLORIDE 0.9 % WEIGHT BASED INFUSION
3.0000 mL/kg/h | INTRAVENOUS | Status: AC
  Administered 2018-02-21: 3 mL/kg/h via INTRAVENOUS

## 2018-02-20 MED ORDER — NITROGLYCERIN IN D5W 200-5 MCG/ML-% IV SOLN
0.0000 ug/min | INTRAVENOUS | Status: DC
  Administered 2018-02-20: 10 ug/min via INTRAVENOUS

## 2018-02-20 MED ORDER — DUTASTERIDE 0.5 MG PO CAPS
0.5000 mg | ORAL_CAPSULE | Freq: Every day | ORAL | Status: DC
  Administered 2018-02-21 – 2018-02-22 (×2): 0.5 mg via ORAL
  Filled 2018-02-20 (×2): qty 1

## 2018-02-20 MED ORDER — SERTRALINE HCL 50 MG PO TABS
50.0000 mg | ORAL_TABLET | Freq: Every day | ORAL | Status: DC
  Administered 2018-02-21 – 2018-02-22 (×2): 50 mg via ORAL
  Filled 2018-02-20 (×2): qty 1

## 2018-02-20 MED ORDER — SODIUM CHLORIDE 0.9% FLUSH
3.0000 mL | Freq: Two times a day (BID) | INTRAVENOUS | Status: DC
  Administered 2018-02-20 – 2018-02-21 (×2): 3 mL via INTRAVENOUS

## 2018-02-20 MED ORDER — ATORVASTATIN CALCIUM 10 MG PO TABS
10.0000 mg | ORAL_TABLET | Freq: Every day | ORAL | Status: DC
  Administered 2018-02-20: 10 mg via ORAL
  Filled 2018-02-20: qty 1

## 2018-02-20 MED ORDER — NITROGLYCERIN IN D5W 200-5 MCG/ML-% IV SOLN
0.0000 ug/min | Freq: Once | INTRAVENOUS | Status: AC
  Administered 2018-02-20: 10 ug/min via INTRAVENOUS

## 2018-02-20 MED ORDER — HEPARIN BOLUS VIA INFUSION
4000.0000 [IU] | Freq: Once | INTRAVENOUS | Status: AC
  Administered 2018-02-20: 4000 [IU] via INTRAVENOUS

## 2018-02-20 MED ORDER — PANTOPRAZOLE SODIUM 40 MG PO TBEC
40.0000 mg | DELAYED_RELEASE_TABLET | Freq: Every day | ORAL | Status: DC
  Administered 2018-02-20 – 2018-02-22 (×4): 40 mg via ORAL
  Filled 2018-02-20 (×3): qty 1

## 2018-02-20 MED ORDER — LEVOTHYROXINE SODIUM 75 MCG PO TABS
75.0000 ug | ORAL_TABLET | Freq: Every day | ORAL | Status: DC
  Administered 2018-02-21 – 2018-02-22 (×2): 75 ug via ORAL
  Filled 2018-02-20 (×2): qty 1

## 2018-02-20 MED ORDER — OMEGA-3-ACID ETHYL ESTERS 1 G PO CAPS
1.0000 g | ORAL_CAPSULE | Freq: Every day | ORAL | Status: DC
  Administered 2018-02-20 – 2018-02-22 (×3): 1 g via ORAL
  Filled 2018-02-20 (×3): qty 1

## 2018-02-20 MED ORDER — ZOLPIDEM TARTRATE 5 MG PO TABS: 5.0000 mg | ORAL_TABLET | Freq: Every evening | ORAL | Status: DC | PRN

## 2018-02-20 MED ORDER — SODIUM CHLORIDE 0.9 % IV SOLN: 250.0000 mL | INTRAVENOUS | Status: DC | PRN

## 2018-02-20 MED ORDER — NITROGLYCERIN 0.4 MG SL SUBL
0.4000 mg | SUBLINGUAL_TABLET | SUBLINGUAL | Status: DC | PRN
  Administered 2018-02-20 (×2): 0.4 mg via SUBLINGUAL
  Filled 2018-02-20: qty 1

## 2018-02-20 MED ORDER — SODIUM CHLORIDE 0.9% FLUSH: 3.0000 mL | INTRAVENOUS | Status: DC | PRN

## 2018-02-20 MED ORDER — ACETAMINOPHEN 325 MG PO TABS: 650.0000 mg | ORAL_TABLET | ORAL | Status: DC | PRN

## 2018-02-20 NOTE — H&P (Addendum)
Cardiology Admission History and Physical:   Patient ID: Russell Brown; MRN: [id number]; DOB: 1945/09/22   Admission date: 02/20/2018  Primary Care Provider: Carollee Herter, Alferd Apa, DO Primary Cardiologist:  New, Dr Lasonya Hubner Primary Electrophysiologist:  n/a  Chief Complaint:  Chest pain  Patient Profile:   Russell Brown is a 72 y.o. male with a history of asthma, OA, HTN, HLD, COPD, OSA not on CPAP, thyroid dz.   History of Present Illness:   Russell Brown was in his USOH. He goes to the gym, does weights and treadmill. Eats healthy.   He was sleeping, was wakened by crushing chest pain. Changed positions, no help. It was central chest pain, no change w/ deep inspiration. Did not feel more SOB than usual, no N&V or diaphoresis. 7/10, got his wife to take him to the hospital. He took ASA 81 mg x 4 at home, [facility] gave him 2 more.   He got SL NTG x 2, he started feeling better w/in a few minutes. The pain almost completely resolved before his transfer, now says it is a 0/10.   Never had anything like this before. He last went to the gym 6 days ago. He was on the treadmill at 3.5 mph. Always a little SOB because of the COPD but no chest pain.  Since that day, he has been active around the house without symptoms.  No LE edema, no orthopnea or PND. Had a sleep study, was told he snores, not bad enough for a CPAP.   No palpitations, no presyncope. Syncope in 2002, hospitalized in [facility], no cause found, suspected orthostatic sx.   No bleeding issues.   Past Medical History:  Diagnosis Date  . 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 Asthma    pt has albuteral inhaler.  [name] Kitchen COPD (chronic obstructive pulmonary disease) ([place])   . GERD (gastroesophageal reflux disease)   . Hyperlipemia   . Hypertension   . PTSD (post-traumatic stress disorder)    retired Engineer, structural   . Sleep apnea    mild - but does not wear a c-pap per pt  . Thyroid disease    thyroid nodules - half of thyroid was removed per pt    Past Surgical History:  Procedure Laterality Date  . ABDOMINAL SURGERY    . APPENDECTOMY    . BACK SURGERY     cspine  . CERVICAL SPINE SURGERY    . HAND AMPUTATION    . HERNIA REPAIR    . NISSEN FUNDOPLICATION    . SEPTOPLASTY    . STOMACH SURGERY    . THYROIDECTOMY    . WISDOM TOOTH EXTRACTION       Medications Prior to Admission: Prior to Admission medications   Medication Sig Start Date End Date Taking? Authorizing Provider  albuterol (PROAIR HFA) 108 (90 Base) MCG/ACT inhaler Inhale 2 puffs into the lungs every 6 (six) hours as needed for wheezing or shortness of breath. 02/19/16   Juanito Doom, MD  AMBULATORY NON FORMULARY MEDICATION Medication Name: Gi cocktail 5-10 MLs every 4-6 hours as needed 08/09/17   Levin Erp, PA  atorvastatin (LIPITOR) 10 MG tablet TAKE 1 TABLET BY MOUTH  DAILY AT 6 PM 10/18/17   Ann Held, DO  Calcium Carbonate-Vitamin D (CALCIUM 600/VITAMIN D) 600-400 MG-UNIT per chew tablet Chew 1 tablet by mouth daily. 08/19/11 08/12/16  Roma Schanz R, DO  dutasteride (  AVODART) 0.5 MG capsule Take 1 capsule (0.5 mg total) by mouth daily. 12/13/17   Roma Schanz R, DO  fish oil-omega-3 fatty acids 1000 MG capsule Take 1 g by mouth daily.     [provider]  fluticasone furoate-vilanterol (BREO ELLIPTA) 200-25 MCG/INH AEPB Inhale 1 puff into the lungs daily. 02/14/18   Rigoberto Noel, MD  ipratropium (ATROVENT) 0.03 % nasal spray Place 2 sprays into both nostrils 3 (three) times daily as needed for rhinitis. 02/24/16   Juanito Doom, MD  levothyroxine (SYNTHROID, LEVOTHROID) 75 MCG tablet TAKE 1 TABLET BY MOUTH  DAILY BEFORE BREAKFAST 12/13/17   Carollee Herter, Alferd Apa, DO  lisinopril-hydrochlorothiazide (PRINZIDE,ZESTORETIC) 10-12.5 MG tablet Take 1 tablet by mouth daily. 12/13/17   Ann Held, DO  Multiple Vitamin (MULTIVITAMIN) tablet Take 1 tablet by  mouth daily.    [provider]  omeprazole (PRILOSEC) 40 MG capsule Take 1 capsule (40 mg total) by mouth daily. 08/09/17   Levin Erp, PA  sertraline (ZOLOFT) 50 MG tablet Take 1 tablet (50 mg total) by mouth daily. 01/12/18   Ann Held, DO     Allergies:    Allergies  Allergen Reactions  . Codeine Anxiety    Social History:   Social History   Socioeconomic History  . Marital status: Married    Spouse name: Not on file  . Number of children: 4  . Years of education: Not on file  . Highest education level: Not on file  Occupational History  . Occupation: retired Corporate treasurer: [employer]  . Financial resource strain: Not on file  . Food insecurity:    Worry: Not on file    Inability: Not on file  . Transportation needs:    Medical: Not on file    Non-medical: Not on file  Tobacco Use  . Smoking status: Former Smoker    Packs/day: 1.00    Years: 35.00    Pack years: 35.00    Types: Cigarettes    Last attempt to quit: 08/18/1996    Years since quitting: 21.5  . Smokeless tobacco: Never Used  Substance and Sexual Activity  . Alcohol use: Yes    Alcohol/week: 1.0 standard drinks    Types: 1 Cans of beer per week    Comment: DAILY 1 beer per day  . Drug use: No  . Sexual activity: Yes    Partners: Female  Lifestyle  . Physical activity:    Days per week: Not on file    Minutes per session: Not on file  . Stress: Not on file  Relationships  . Social connections:    Talks on phone: Not on file    Gets together: Not on file    Attends religious service: Not on file    Active member of club or organization: Not on file    Attends meetings of clubs or organizations: Not on file    Relationship status: Not on file  . Intimate partner violence:    Fear of current or ex partner: Not on file    Emotionally abused: Not on file    Physically abused: Not on file    Forced sexual activity: Not on file    Other Topics Concern  . Not on file  Social History Narrative   Exercise- weights and walk on treadmill    Family History:   The patient's family history includes Alzheimer's disease  in his paternal uncle; Asthma in his son; Cancer in his father and maternal grandmother; Heart disease in his paternal grandfather and paternal grandmother; Hyperlipidemia in his father; Hypertension in his father; Osteoporosis in his maternal grandmother, mother, and paternal aunt; Parkinsonism in his mother; Pneumonia in his mother; Prostate cancer in his father. There is no history of Colon cancer, Pancreatic cancer, Stomach cancer, Esophageal cancer, Liver cancer, or Rectal cancer.   The patient He indicated that his mother is deceased. He indicated that his father is deceased. He indicated that his maternal grandmother is deceased. He indicated that his maternal grandfather is deceased. He indicated that his paternal grandmother is deceased. He indicated that his paternal grandfather is deceased. He indicated that his son is alive. He indicated that his paternal aunt is alive. He indicated that his paternal uncle is deceased. He indicated that the status of his neg hx is unknown.  ROS:  Please see the history of present illness.  All other ROS reviewed and negative.     Physical Exam/Data:   Vitals:   02/20/18 1000 02/20/18 1015 02/20/18 1030 02/20/18 1158  BP: 100/67 (!) 115/103 (!) 117/94 116/78  Pulse: 62 60 75 (!) 59  Resp: 15 19 16    Temp:    97.8 F (36.6 C)  TempSrc:    Oral  SpO2: 98% 97% 90% 97%  Weight:    84.6 kg  Height:    6\' 1"  (1.854 m)    Intake/Output Summary (Last 24 hours) at 02/20/2018 1237 Last data filed at 02/20/2018 1020 Gross per 24 hour  Intake 1000.24 ml  Output -  Net 1000.24 ml   Filed Weights   02/20/18 0724 02/20/18 1158  Weight: 83.9 kg 84.6 kg   Body mass index is 24.62 kg/m.  General:  Well nourished, well developed, in no acute distress HEENT: normal Lymph:  no adenopathy Neck:  JVD not elevated Endocrine:  No thryomegaly Vascular: No carotid bruits; FA pulses 2+ bilaterally  Cardiac:  normal S1, S2; RRR; no murmur, no rub or gallop  Lungs:  clear to auscultation bilaterally, no wheezing, rhonchi, few scattered rales  Abd: soft, nontender, no hepatomegaly  Ext: No edema Musculoskeletal:  No deformities, BUE and BLE strength normal and equal Skin: warm and dry  Neuro:  CNs 2-12 intact, no focal abnormalities noted Psych:  Normal affect    EKG:  The ECG that was done today was personally reviewed and demonstrates sinus rhythm/sinus bradycardia, heart rate 55, minimal ST changes in the anterior leads.  Relevant CV Studies:  ECHO:   CATH:   Laboratory Data:  Chemistry Recent Labs  Lab 02/20/18 0731  NA 141  K 4.2  CL 103  CO2 27  GLUCOSE 108*  BUN 14  CREATININE 1.04  CALCIUM 9.3  GFRNONAA >60  GFRAA >60  ANIONGAP 11    Hematology Recent Labs  Lab 02/20/18 0731  WBC 14.3*  RBC 5.40  HGB 16.6  HCT 48.4  MCV 89.6  MCH 30.7  MCHC 34.3  RDW 13.8  PLT 211   Cardiac Enzymes Recent Labs  Lab 02/20/18 0731 02/20/18 1041  TROPONINI <0.03 <0.03   Lab Results  Component Value Date   TSH 1.37 12/15/2017     Radiology/Studies:  Dg Chest 2 View  Result Date: 02/20/2018 CLINICAL DATA:  Sternal chest pain woke pt from sleep at 6am. Continuous since then, Patient has a hx of HTN, hyperlipidemia, COPD, Asthma, no other complaints EXAM: CHEST - 2 VIEW  COMPARISON:  12/06/2016 FINDINGS: The lungs are hyperinflated. There are significant emphysematous changes. Perihilar peribronchial thickening is noted. The heart size is normal. Chronic scarring is identified in the lung bases. No focal consolidation or pleural effusion. No pulmonary edema. Surgical clips are seen in the LEFT UPPER QUADRANT. IMPRESSION: 1.  Emphysema (ICD10-J43.9). 2. Bronchitic changes. 3. No acute abnormality. Electronically Signed   By: Nolon Nations  M.D.   On: 02/20/2018 08:12    Assessment and Plan:   Principal Problem: 1.  Chest pain with high risk for cardiac etiology -Admit, continue to cycle enzymes - He has no cardiac history, and symptoms are very concerning. - With resting chest pain that is relieved by nitroglycerin and multiple cardiac risk factors, favor cardiac catheterization to further define his anatomy. -We will also order an echo -Continue aspirin, nitrates, no beta-blocker with resting bradycardia  Active Problems: 2.  Hypothyroidism -TSH was normal in June - Recheck, continue home dose of Synthroid  2.  Hyperlipidemia LDL goal <100 -Check a profile, goal LDL is now less than 70  3.  Essential hypertension -Hold lisinopril HCTZ for cath  Severity of Illness: The appropriate patient status for this patient is INPATIENT. Inpatient status is judged to be reasonable and necessary in order to provide the required intensity of service to ensure the patient's safety. The patient's presenting symptoms, physical exam findings, and initial radiographic and laboratory data in the context of their chronic comorbidities is felt to place them at high risk for further clinical deterioration. Furthermore, it is not anticipated that the patient will be medically stable for discharge from the hospital within 2 midnights of admission. The following factors support the patient status of inpatient.   " The patient's presenting symptoms include chest pain. " The worrisome physical exam findings include abnormal ECG. " The initial radiographic and laboratory data are worrisome because of abnormal ECG. " The chronic co-morbidities include hypertension, hyperlipidemia.   * I certify that at the point of admission it is my clinical judgment that the patient will require inpatient hospital care spanning beyond 2 midnights from the point of admission due to high intensity of service, high risk for further deterioration and high frequency  of surveillance required.*    For questions or updates, please contact [place] Please consult [email or web address] for contact info under Cardiology/STEMI.    Signed, Rosaria Ferries, PA-C  02/20/2018 12:37 PM    Attending Note:   The patient was seen and examined.  Agree with assessment and plan as noted above.  Changes made to the above note as needed.  Patient seen and independently examined with Rosaria Ferries, PA .   We discussed all aspects of the encounter. I agree with the assessment and plan as stated above.  1.  Unstable angina: The patient presents with symptoms that are concerning for unstable angina.  He had severe chest pain that woke him up from sleep.  The pain was relieved with 2 sublingual nitroglycerin at [facility]. He is currently pain-free on a nitroglycerin drip and  heparin drip.  ECG reveals sinus bradycardia 55 beats minute.  He has peaked T waves.  He is completely pain-free at this time.  We discussed cardiac catheterization with possible PCI. We have discussed the risks, benefits, options.  He understands and agrees to proceed to have cath tomorrow morning.    I have spent a total of 40 minutes with patient reviewing hospital  notes , telemetry, EKGs, labs and examining  patient as well as establishing an assessment and plan that was discussed with the patient. > 50% of time was spent in direct patient care.    Thayer Headings, Brooke Bonito., MD, [facility] 02/20/2018, 12:59 PM [address],  [place] Pager [phone]

## 2018-02-20 NOTE — ED Notes (Signed)
Patient transported to X-ray 

## 2018-02-20 NOTE — Progress Notes (Signed)
[facility] for heparin Indication: chest pain/ACS  Allergies  Allergen Reactions  . Codeine Anxiety    Reaction if taken for extended periods of time.    Patient Measurements: Height: 6\' 1"  (185.4 cm) Weight: 186 lb 9.6 oz (84.6 kg) IBW/kg (Calculated) : 79.9 Heparin Dosing Weight: 83.9kg  Vital Signs: Temp: 97.8 F (36.6 C) (09/02 1158) Temp Source: Oral (09/02 1158) BP: 116/78 (09/02 1158) Pulse Rate: 59 (09/02 1158)  Labs: Recent Labs    02/20/18 0731 02/20/18 1041 02/20/18 1612 02/20/18 1725  HGB 16.6  --   --   --   HCT 48.4  --   --   --   PLT 211  --   --   --   LABPROT  --   --  13.6  --   INR  --   --  1.05  --   HEPARINUNFRC  --   --   --  0.27*  CREATININE 1.04  --   --   --   TROPONINI <0.03 <0.03 <0.03  --     Estimated Creatinine Clearance: 72.6 mL/min (by C-G formula based on SCr of 1.04 mg/dL).    Assessment: 39 yom presented to the hospital with CP. To start IV heparin. Baseline CBC is WNL and he is not on anticoagulation PTA.  Initial heparin level = 0.27  Goal of Therapy:  Heparin level 0.3-0.7 units/ml Monitor platelets by anticoagulation protocol: Yes   Plan:  Increase heparin to 1150 units / hr Daily heparin level and CBC  Follow up after cath 9/3  Thank you Anette Guarneri, PharmD [phone]  02/20/2018,6:14 PM

## 2018-02-20 NOTE — Progress Notes (Signed)
ANTICOAGULATION CONSULT NOTE - Initial Consult  Pharmacy Consult for heparin Indication: chest pain/ACS  Allergies  Allergen Reactions  . Codeine Anxiety    Patient Measurements: Height: 6\' 1"  (185.4 cm) Weight: 185 lb (83.9 kg) IBW/kg (Calculated) : 79.9 Heparin Dosing Weight: 83.9kg  Vital Signs: Temp: 98.2 F (36.8 C) (09/02 0728) Temp Source: Oral (09/02 0728) BP: 92/62 (09/02 0921) Pulse Rate: 56 (09/02 0921)  Labs: Recent Labs    02/20/18 0731  HGB 16.6  HCT 48.4  PLT 211  CREATININE 1.04  TROPONINI <0.03    Estimated Creatinine Clearance: 72.6 mL/min (by C-G formula based on SCr of 1.04 mg/dL).   Medical History: Past Medical History:  Diagnosis Date  . 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 Asthma    pt has albuteral inhaler.  [name] Kitchen COPD (chronic obstructive pulmonary disease) ([place])   . GERD (gastroesophageal reflux disease)   . Hyperlipemia   . Hypertension   . PTSD (post-traumatic stress disorder)   . PTSD (post-traumatic stress disorder)    retired Engineer, structural   . Sleep apnea    mild - but does not wear a c-pap per pt  . Thyroid disease    thyroid nodules - half of thyroid was removed per pt    Medications:  Infusions:  . sodium chloride    . heparin    . nitroGLYCERIN    . sodium chloride      Assessment: 39 yom presented to the hospital with CP. To start IV heparin. Baseline CBC is WNL and he is not on anticoagulation PTA.  Goal of Therapy:  Heparin level 0.3-0.7 units/ml Monitor platelets by anticoagulation protocol: Yes   Plan:  Heparin bolus 4000 units IV x 1  Heparin gtt 1000 units/hr Check an 8 hr heparin level Daily heparin level and CBC  Jayah Balthazar, Rande Lawman 02/20/2018,9:31 AM

## 2018-02-20 NOTE — ED Triage Notes (Signed)
Sternal chest pain woke pt from sleep at 6am.  Continuous since then.  Nothing makes it better or worse. No radiation. Denies n/v.

## 2018-02-20 NOTE — ED Provider Notes (Signed)
[facility] EMERGENCY DEPARTMENT Provider Note   CSN: [id number] Arrival date & time: 02/20/18  4010     History   Chief Complaint Chief Complaint  Patient presents with  . Chest Pain    HPI Russell Brown is a 72 y.o. male.  72 yo M with a chief complaint of chest pain.  This woke him up from sleep about an hour ago.  Described as a crushing severe pain across the anterior part of his chest.  He denies radiation but then later states may be a goes to his neck.  Has been short of breath.  Denies diaphoresis or vomiting.  Is unable to find a position that makes this more comfortable.  Has a history of reflux disease but thinks that this is different.  Has a history of hypertension hyperlipidemia.  Denies diabetes no family history of MI.  Denies smoking history.  Denies history of PE or DVT.  Denies unilateral lower extremity edema denies hemoptysis denies recent immobilization denies recent surgery or prolonged travel.  Denies testosterone use.  The history is provided by the patient and the spouse.  Chest Pain   This is a new problem. The current episode started 1 to 2 hours ago. The problem occurs constantly. The problem has not changed since onset.The pain is present in the substernal region. The pain is at a severity of 8/10. The pain is severe. The quality of the pain is described as heavy. The pain radiates to the right neck. Duration of episode(s) is 90 minutes. Associated symptoms include shortness of breath. Pertinent negatives include no abdominal pain, no fever, no headaches, no palpitations and no vomiting. He has tried nothing for the symptoms. The treatment provided no relief.  His past medical history is significant for hyperlipidemia and hypertension.  Pertinent negatives for past medical history include no diabetes, no DVT, no MI and no PE.  Pertinent negatives for family medical history include: no early MI.    Past Medical History:  Diagnosis Date  .  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 Asthma    pt has albuteral inhaler.  [name] Kitchen COPD (chronic obstructive pulmonary disease) ([place])   . GERD (gastroesophageal reflux disease)   . Hyperlipemia   . Hypertension   . PTSD (post-traumatic stress disorder)   . PTSD (post-traumatic stress disorder)    retired Engineer, structural   . Sleep apnea    mild - but does not wear a c-pap per pt  . Thyroid disease    thyroid nodules - half of thyroid was removed per pt    Patient Active Problem List   Diagnosis Date Noted  . Preventative health care 12/06/2016  . Acute bronchitis 12/06/2016  . Dyspnea 02/19/2016  . 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 Anxiety 06/20/2012  . Head injury, closed, with brief LOC ([place]) 06/20/2012  . CAP (community acquired pneumonia) 03/03/2012  . Tinnitus 03/03/2012  . Benign prostate hyperplasia 05/22/2009  . Asthma 05/20/2008  . COPD with asthma ([place]) 03/26/2008  . ELEVATED PROSTATE SPECIFIC ANTIGEN 10/28/2006  . Hypothyroidism 10/26/2006  . Hyperlipidemia LDL goal <100 10/26/2006  . ERECTILE DYSFUNCTION 10/26/2006  . Essential hypertension 10/26/2006  . GERD 10/26/2006    Past Surgical History:  Procedure Laterality Date  . ABDOMINAL SURGERY    . APPENDECTOMY    . BACK SURGERY     cspine  . CERVICAL SPINE SURGERY    . HAND AMPUTATION    .  HERNIA REPAIR    . NISSEN FUNDOPLICATION    . SEPTOPLASTY    . STOMACH SURGERY    . THYROIDECTOMY    . WISDOM TOOTH EXTRACTION          Home Medications    Prior to Admission medications   Medication Sig Start Date End Date Taking? Authorizing Provider  albuterol (PROAIR HFA) 108 (90 Base) MCG/ACT inhaler Inhale 2 puffs into the lungs every 6 (six) hours as needed for wheezing or shortness of breath. 02/19/16   Juanito Doom, MD  AMBULATORY NON FORMULARY MEDICATION Medication Name: Gi cocktail 5-10 MLs every 4-6 hours as needed 08/09/17   Levin Erp, PA  atorvastatin  (LIPITOR) 10 MG tablet TAKE 1 TABLET BY MOUTH  DAILY AT 6 PM 10/18/17   Ann Held, DO  Calcium Carbonate-Vitamin D (CALCIUM 600/VITAMIN D) 600-400 MG-UNIT per chew tablet Chew 1 tablet by mouth daily. 08/19/11 08/12/16  Ann Held, DO  dutasteride (AVODART) 0.5 MG capsule Take 1 capsule (0.5 mg total) by mouth daily. 12/13/17   Roma Schanz R, DO  fish oil-omega-3 fatty acids 1000 MG capsule Take 1 g by mouth daily.     [provider]  fluticasone furoate-vilanterol (BREO ELLIPTA) 200-25 MCG/INH AEPB Inhale 1 puff into the lungs daily. 02/14/18   Rigoberto Noel, MD  ipratropium (ATROVENT) 0.03 % nasal spray Place 2 sprays into both nostrils 3 (three) times daily as needed for rhinitis. 02/24/16   Juanito Doom, MD  levothyroxine (SYNTHROID, LEVOTHROID) 75 MCG tablet TAKE 1 TABLET BY MOUTH  DAILY BEFORE BREAKFAST 12/13/17   Carollee Herter, Alferd Apa, DO  lisinopril-hydrochlorothiazide (PRINZIDE,ZESTORETIC) 10-12.5 MG tablet Take 1 tablet by mouth daily. 12/13/17   Ann Held, DO  Multiple Vitamin (MULTIVITAMIN) tablet Take 1 tablet by mouth daily.    [provider]  omeprazole (PRILOSEC) 40 MG capsule Take 1 capsule (40 mg total) by mouth daily. 08/09/17   Levin Erp, PA  sertraline (ZOLOFT) 50 MG tablet Take 1 tablet (50 mg total) by mouth daily. 01/12/18   Ann Held, DO    Family History Family History  Problem Relation Age of Onset  . Parkinsonism Mother   . Osteoporosis Mother   . Cancer Father        prostate,  skin  . Hyperlipidemia Father   . Hypertension Father   . Prostate cancer Father   . Asthma Son   . Osteoporosis Paternal Aunt   . Alzheimer's disease Paternal Uncle   . Osteoporosis Maternal Grandmother   . Cancer Maternal Grandmother        lung  . Heart disease Paternal Grandmother        MI  . Heart disease Paternal Grandfather        MI  . Colon cancer Neg Hx   . Pancreatic cancer Neg Hx   .  Stomach cancer Neg Hx   . Esophageal cancer Neg Hx   . Liver cancer Neg Hx   . Rectal cancer Neg Hx     Social History Social History   Tobacco Use  . Smoking status: Former Smoker    Packs/day: 1.00    Years: 35.00    Pack years: 35.00    Types: Cigarettes    Last attempt to quit: 08/18/1996    Years since quitting: 21.5  . Smokeless tobacco: Never Used  Substance Use Topics  . Alcohol use: Yes  Alcohol/week: 1.0 standard drinks    Types: 1 Cans of beer per week    Comment: DAILY 1 beer per day  . Drug use: No     Allergies   Codeine   Review of Systems Review of Systems  Constitutional: Negative for chills and fever.  HENT: Negative for congestion and facial swelling.   Eyes: Negative for discharge and visual disturbance.  Respiratory: Positive for shortness of breath.   Cardiovascular: Positive for chest pain. Negative for palpitations.  Gastrointestinal: Negative for abdominal pain, diarrhea and vomiting.  Musculoskeletal: Negative for arthralgias and myalgias.  Skin: Negative for color change and rash.  Neurological: Negative for tremors, syncope and headaches.  Psychiatric/Behavioral: Negative for confusion and dysphoric mood.     Physical Exam Updated Vital Signs BP 92/62   Pulse (!) 56   Temp 98.2 F (36.8 C) (Oral)   Resp 13   Ht 6\' 1"  (1.854 m)   Wt 83.9 kg   SpO2 98%   BMI 24.41 kg/m   Physical Exam  Constitutional: He is oriented to person, place, and time. He appears well-developed and well-nourished.  HENT:  Head: Normocephalic and atraumatic.  Eyes: Pupils are equal, round, and reactive to light. EOM are normal.  Neck: Normal range of motion. Neck supple. No JVD present.  Cardiovascular: Normal rate and regular rhythm. Exam reveals no gallop and no friction rub.  No murmur heard. Pulmonary/Chest: No respiratory distress. He has no wheezes.  Abdominal: He exhibits no distension and no mass. There is no tenderness. There is no rebound  and no guarding.  Musculoskeletal: Normal range of motion.  2-3 digit surgically absent from L hand  Neurological: He is alert and oriented to person, place, and time.  Skin: No rash noted. No pallor.  Psychiatric: He has a normal mood and affect. His behavior is normal.  Nursing note and vitals reviewed.    ED Treatments / Results  Labs (all labs ordered are listed, but only abnormal results are displayed) Labs Reviewed  BASIC METABOLIC PANEL - Abnormal; Notable for the following components:      Result Value   Glucose, Bld 108 (*)    All other components within normal limits  CBC - Abnormal; Notable for the following components:   WBC 14.3 (*)    All other components within normal limits  CBG MONITORING, ED - Abnormal; Notable for the following components:   Glucose-Capillary 100 (*)    All other components within normal limits  TROPONIN I  TROPONIN I    EKG EKG Interpretation  Date/Time:  Monday February 20 2018 07:25:35 EDT Ventricular Rate:  68 PR Interval:    QRS Duration: 108 QT Interval:  406 QTC Calculation: 432 R Axis:   -24 Text Interpretation:  Sinus rhythm Ventricular premature complex Borderline prolonged PR interval Borderline left axis deviation Abnormal R-wave progression, early transition Minimal ST elevation, anterior leads Baseline wander in lead(s) V5 No significant change since last tracing Confirmed by Deno Etienne [phone]) on 02/20/2018 7:36:14 AM Also confirmed by Deno Etienne [phone]), editor Philomena Doheny [phone])  on 02/20/2018 8:33:54 AM   Radiology Dg Chest 2 View  Result Date: 02/20/2018 CLINICAL DATA:  Sternal chest pain woke pt from sleep at 6am. Continuous since then, Patient has a hx of HTN, hyperlipidemia, COPD, Asthma, no other complaints EXAM: CHEST - 2 VIEW COMPARISON:  12/06/2016 FINDINGS: The lungs are hyperinflated. There are significant emphysematous changes. Perihilar peribronchial thickening is noted. The heart size is normal. Chronic  scarring is identified in the lung bases. No focal consolidation or pleural effusion. No pulmonary edema. Surgical clips are seen in the LEFT UPPER QUADRANT. IMPRESSION: 1.  Emphysema (ICD10-J43.9). 2. Bronchitic changes. 3. No acute abnormality. Electronically Signed   By: Nolon Nations M.D.   On: 02/20/2018 08:12    Procedures Procedures (including critical care time)  Medications Ordered in ED Medications  nitroGLYCERIN (NITROSTAT) SL tablet 0.4 mg (0.4 mg Sublingual Given 02/20/18 0749)  nitroGLYCERIN 50 mg in dextrose 5 % 250 mL (0.2 mg/mL) infusion (has no administration in time range)  sodium chloride 0.9 % bolus 500 mL (has no administration in time range)  heparin bolus via infusion 4,000 Units (has no administration in time range)  heparin ADULT infusion 100 units/mL (25000 units/250mL sodium chloride 0.45%) (has no administration in time range)  aspirin chewable tablet 162 mg (162 mg Oral Given 02/20/18 0738)  sodium chloride 0.9 % bolus 500 mL ( Intravenous Stopped 02/20/18 0859)     Initial Impression / Assessment and Plan / ED Course  I have reviewed the triage vital signs and the nursing notes.  Pertinent labs & imaging results that were available during my care of the patient were reviewed by me and considered in my medical decision making (see chart for details).     72 yo M with a chief complaint of chest pain.  His EKG shows hyperacute T waves though this appears to be a chronic finding for him.  He took aspirin at home, will give him 162 here.  Given nitroglycerin.  Chest x-ray troponin labs reassess.  Initial troponin is negative.  Patient has a leukocytosis.  Chest x-ray with chronic findings no acute finding per my view.  Patient continues to have pain.  He went from 10 out of 10 to 3 out of 10 rapidly with nitroglycerin.  His blood pressure unfortunately has dropped into the 80s.  Was given a fluid bolus.  I discussed the case with Dr. Acie Fredrickson, cardiology.  Recommended  that I start the patient on a heparin drip and transferred to [facility] for further evaluation.  CRITICAL CARE Performed by: Cecilio Asper   Total critical care time: 35 minutes  Critical care time was exclusive of separately billable procedures and treating other patients.  Critical care was necessary to treat or prevent imminent or life-threatening deterioration.  Critical care was time spent personally by me on the following activities: development of treatment plan with patient and/or surrogate as well as nursing, discussions with consultants, evaluation of patient's response to treatment, examination of patient, obtaining history from patient or surrogate, ordering and performing treatments and interventions, ordering and review of laboratory studies, ordering and review of radiographic studies, pulse oximetry and re-evaluation of patient's condition.  The patients results and plan were reviewed and discussed.   Any x-rays performed were independently reviewed by myself.   Differential diagnosis were considered with the presenting HPI.  Medications  nitroGLYCERIN (NITROSTAT) SL tablet 0.4 mg (0.4 mg Sublingual Given 02/20/18 0749)  nitroGLYCERIN 50 mg in dextrose 5 % 250 mL (0.2 mg/mL) infusion (has no administration in time range)  sodium chloride 0.9 % bolus 500 mL (has no administration in time range)  heparin bolus via infusion 4,000 Units (has no administration in time range)  heparin ADULT infusion 100 units/mL (25000 units/222mL sodium chloride 0.45%) (has no administration in time range)  aspirin chewable tablet 162 mg (162 mg Oral Given 02/20/18 0738)  sodium chloride 0.9 % bolus 500  mL ( Intravenous Stopped 02/20/18 0859)    Vitals:   02/20/18 0800 02/20/18 0805 02/20/18 0829 02/20/18 0921  BP: (!) 77/55 (!) 84/59 (!) 86/56 92/62  Pulse: 70 (!) 56 (!) 55 (!) 56  Resp: 11 14 13 13   Temp:      TempSrc:      SpO2: 98% 100% 100% 98%  Weight:      Height:         Final diagnoses:  Chest pain with high risk for cardiac etiology    Admission/ observation were discussed with the admitting physician, patient and/or family and they are comfortable with the plan.   Final Clinical Impressions(s) / ED Diagnoses   Final diagnoses:  Chest pain with high risk for cardiac etiology    ED Discharge Orders    None       Deno Etienne, DO 02/20/18 3744

## 2018-02-20 NOTE — ED Notes (Signed)
ED Provider at bedside. 

## 2018-02-21 ENCOUNTER — Other Ambulatory Visit (HOSPITAL_COMMUNITY): Payer: Medicare Other

## 2018-02-21 ENCOUNTER — Encounter (HOSPITAL_COMMUNITY)
Admission: EM | Disposition: A | Discharge status: Home / Self Care | Payer: Self-pay | Source: Home / Self Care | Attending: Cardiovascular Disease

## 2018-02-21 DIAGNOSIS — E785 Hyperlipidemia, unspecified: Secondary | ICD-10-CM

## 2018-02-21 HISTORY — PX: LEFT HEART CATH AND CORONARY ANGIOGRAPHY: CATH118249

## 2018-02-21 LAB — LIPID PANEL
Cholesterol: 126 mg/dL (ref 0–200)
HDL: 41 mg/dL (ref >40)
LDL CALC: 68 mg/dL (ref 0–99)
Total CHOL/HDL Ratio: 3.1 RATIO
Triglycerides: 85 mg/dL (ref <150)
VLDL: 17 mg/dL (ref 0–40)

## 2018-02-21 LAB — CBC
HEMATOCRIT: 41.1 % (ref 39.0–52.0)
Hemoglobin: 13.4 g/dL (ref 13.0–17.0)
MCH: 30 pg (ref 26.0–34.0)
MCHC: 32.6 g/dL (ref 30.0–36.0)
MCV: 91.9 fL (ref 78.0–100.0)
PLATELETS: 177 10*3/uL (ref 150–400)
RBC: 4.47 MIL/uL (ref 4.22–5.81)
RDW: 13 % (ref 11.5–15.5)
WBC: 8 10*3/uL (ref 4.0–10.5)

## 2018-02-21 LAB — COMPREHENSIVE METABOLIC PANEL
ALK PHOS: 53 U/L (ref 38–126)
ALT: 19 U/L (ref 0–44)
AST: 16 U/L (ref 15–41)
Albumin: 3 g/dL — ABNORMAL LOW (ref 3.5–5.0)
Anion gap: 8 (ref 5–15)
BUN: 12 mg/dL (ref 8–23)
CALCIUM: 8.1 mg/dL — AB (ref 8.9–10.3)
CHLORIDE: 109 mmol/L (ref 98–111)
CO2: 24 mmol/L (ref 22–32)
CREATININE: 1.02 mg/dL (ref 0.61–1.24)
GFR calc non Af Amer: >60 mL/min (ref >60)
GLUCOSE: 106 mg/dL — AB (ref 70–99)
Potassium: 3.8 mmol/L (ref 3.5–5.1)
Sodium: 141 mmol/L (ref 135–145)
Total Bilirubin: 0.9 mg/dL (ref 0.3–1.2)
Total Protein: 5.1 g/dL — ABNORMAL LOW (ref 6.5–8.1)

## 2018-02-21 LAB — HEPARIN LEVEL (UNFRACTIONATED): Heparin Unfractionated: 0.49 IU/mL (ref 0.30–0.70)

## 2018-02-21 LAB — TROPONIN I: Troponin I: <0.03 ng/mL (ref <0.03)

## 2018-02-21 SURGERY — LEFT HEART CATH AND CORONARY ANGIOGRAPHY
Anesthesia: LOCAL

## 2018-02-21 MED ORDER — HEPARIN SODIUM (PORCINE) 1000 UNIT/ML IJ SOLN
INTRAMUSCULAR | Status: AC
  Filled 2018-02-21: qty 1

## 2018-02-21 MED ORDER — VERAPAMIL HCL 2.5 MG/ML IV SOLN
INTRAVENOUS | Status: DC | PRN
  Administered 2018-02-21: 16:00:00 via INTRA_ARTERIAL

## 2018-02-21 MED ORDER — VERAPAMIL HCL 2.5 MG/ML IV SOLN
INTRAVENOUS | Status: AC
  Filled 2018-02-21: qty 2

## 2018-02-21 MED ORDER — SODIUM CHLORIDE 0.9 % IV SOLN: INTRAVENOUS | Status: DC

## 2018-02-21 MED ORDER — FENTANYL CITRATE (PF) 100 MCG/2ML IJ SOLN
INTRAMUSCULAR | Status: DC | PRN
  Administered 2018-02-21: 25 ug via INTRAVENOUS

## 2018-02-21 MED ORDER — FENTANYL CITRATE (PF) 100 MCG/2ML IJ SOLN
INTRAMUSCULAR | Status: AC
  Filled 2018-02-21: qty 2

## 2018-02-21 MED ORDER — DIAZEPAM 5 MG PO TABS: 5.0000 mg | ORAL_TABLET | ORAL | Status: DC | PRN

## 2018-02-21 MED ORDER — SODIUM CHLORIDE 0.9 % IV SOLN: 250.0000 mL | INTRAVENOUS | Status: DC | PRN

## 2018-02-21 MED ORDER — MIDAZOLAM HCL 2 MG/2ML IJ SOLN
INTRAMUSCULAR | Status: AC
  Filled 2018-02-21: qty 2

## 2018-02-21 MED ORDER — HEPARIN (PORCINE) IN NACL 1000-0.9 UT/500ML-% IV SOLN
INTRAVENOUS | Status: DC | PRN
  Administered 2018-02-21: 500 mL

## 2018-02-21 MED ORDER — HEPARIN (PORCINE) IN NACL 1000-0.9 UT/500ML-% IV SOLN
INTRAVENOUS | Status: AC
  Filled 2018-02-21: qty 1000

## 2018-02-21 MED ORDER — ATORVASTATIN CALCIUM 80 MG PO TABS
80.0000 mg | ORAL_TABLET | Freq: Every day | ORAL | Status: DC
  Administered 2018-02-21: 80 mg via ORAL
  Filled 2018-02-21: qty 1

## 2018-02-21 MED ORDER — ASPIRIN 81 MG PO CHEW
81.0000 mg | CHEWABLE_TABLET | Freq: Every day | ORAL | Status: DC
  Administered 2018-02-22: 81 mg via ORAL
  Filled 2018-02-21: qty 1

## 2018-02-21 MED ORDER — HEPARIN SODIUM (PORCINE) 1000 UNIT/ML IJ SOLN
INTRAMUSCULAR | Status: DC | PRN
  Administered 2018-02-21: 4000 [IU] via INTRAVENOUS

## 2018-02-21 MED ORDER — ACETAMINOPHEN 325 MG PO TABS: 650.0000 mg | ORAL_TABLET | ORAL | Status: DC | PRN

## 2018-02-21 MED ORDER — LIDOCAINE HCL (PF) 1 % IJ SOLN
INTRAMUSCULAR | Status: AC
  Filled 2018-02-21: qty 30

## 2018-02-21 MED ORDER — SODIUM CHLORIDE 0.9% FLUSH
3.0000 mL | Freq: Two times a day (BID) | INTRAVENOUS | Status: DC
  Administered 2018-02-21 – 2018-02-22 (×2): 3 mL via INTRAVENOUS

## 2018-02-21 MED ORDER — LIDOCAINE HCL (PF) 1 % IJ SOLN
INTRAMUSCULAR | Status: DC | PRN
  Administered 2018-02-21: 2 mL

## 2018-02-21 MED ORDER — ONDANSETRON HCL 4 MG/2ML IJ SOLN: 4.0000 mg | Freq: Four times a day (QID) | INTRAMUSCULAR | Status: DC | PRN

## 2018-02-21 MED ORDER — SODIUM CHLORIDE 0.9% FLUSH: 3.0000 mL | INTRAVENOUS | Status: DC | PRN

## 2018-02-21 MED ORDER — IOHEXOL 350 MG/ML SOLN
INTRAVENOUS | Status: DC | PRN
  Administered 2018-02-21: 80 mL via INTRA_ARTERIAL

## 2018-02-21 MED ORDER — MIDAZOLAM HCL 2 MG/2ML IJ SOLN
INTRAMUSCULAR | Status: DC | PRN
  Administered 2018-02-21: 1 mg via INTRAVENOUS

## 2018-02-21 SURGICAL SUPPLY — 11 items

## 2018-02-21 NOTE — Progress Notes (Signed)
Russell Brown for heparin Indication: chest pain/ACS  Allergies  Allergen Reactions  . Codeine Anxiety    Reaction if taken for extended periods of time.    Patient Measurements: Height: 6\' 1"  (185.4 cm) Weight: 183 lb 8 oz (83.2 kg) IBW/kg (Calculated) : 79.9 Heparin Dosing Weight: 83.9kg  Vital Signs: Temp: 98.2 F (36.8 C) (09/03 0550) Temp Source: Oral (09/03 0550) BP: 112/71 (09/03 0550) Pulse Rate: 58 (09/03 0550)  Labs: Recent Labs    02/20/18 0731  02/20/18 1612 02/20/18 1725 02/20/18 2154 02/21/18 0429  HGB 16.6  --   --   --   --  13.4  HCT 48.4  --   --   --   --  41.1  PLT 211  --   --   --   --  177  LABPROT  --   --  13.6  --   --   --   INR  --   --  1.05  --   --   --   HEPARINUNFRC  --   --   --  0.27*  --  0.49  CREATININE 1.04  --   --   --   --  1.02  TROPONINI <0.03   < > <0.03  --  <0.03 <0.03   < > = values in this interval not displayed.    Estimated Creatinine Clearance: 74 mL/min (by C-G formula based on SCr of 1.02 mg/dL).    Assessment: 9 yom presented to the hospital with CP. on IV heparin.  He is not on anticoagulation PTA. Plans for cath today.  -heparin level= 0.49   Goal of Therapy:  Heparin level 0.3-0.7 units/ml Monitor platelets by anticoagulation protocol: Yes   Plan:  -no heparin changes needed -Daily heparin level and CBC -Will follow plans post cath  Hildred Laser, PharmD Clinical Pharmacist Please check Amion for pharmacy contact number

## 2018-02-21 NOTE — Research (Signed)
CADFEM Informed Consent   Subject Name: Russell Brown  Subject met inclusion and exclusion criteria.  The informed consent form, study requirements and expectations were reviewed with the subject and questions and concerns were addressed prior to the signing of the consent form.  The subject verbalized understanding of the trail requirements.  The subject agreed to participate in the CADFEM trial and signed the informed consent.  The informed consent was obtained prior to performance of any protocol-specific procedures for the subject.  A copy of the signed informed consent was given to the subject and a copy was placed in the subject's medical record.  [name] 02/21/2018, 12:18 PM

## 2018-02-21 NOTE — H&P (View-Only) (Signed)
Progress Note  Patient Name: Russell Brown Date of Encounter: 02/21/2018  Primary Cardiologist: Dr. Liam Rogers  Subjective   Russell Brown is a 72 y.o. male with a history of asthma, OA, HTN, HLD, COPD, OSA not on CPAP, thyroid dz.  he was admitted from [facility] with chest pain responsive to nitroglycerin.  He is remained pain-free on IV heparin and nitro.  Enzymes have been negative and his EKG shows no acute changes.  He is scheduled for cardiac catheterization today.  Inpatient Medications    Scheduled Meds: . atorvastatin  10 mg Oral q1800  . dutasteride  0.5 mg Oral Daily  . fluticasone furoate-vilanterol  1 puff Inhalation Daily  . levothyroxine  75 mcg Oral QAC breakfast  . omega-3 acid ethyl esters  1 g Oral Daily  . pantoprazole  40 mg Oral Daily  . sertraline  50 mg Oral Daily  . sodium chloride flush  3 mL Intravenous Q12H   Continuous Infusions: . sodium chloride    . sodium chloride 1 mL/kg/hr (02/21/18 0500)  . heparin 1,150 Units/hr (02/21/18 0358)  . nitroGLYCERIN 10 mcg/min (02/21/18 0651)   PRN Meds: sodium chloride, acetaminophen, albuterol, ipratropium, nitroGLYCERIN, ondansetron (ZOFRAN) IV, sodium chloride flush, zolpidem   Vital Signs    Vitals:   02/20/18 1957 02/20/18 2041 02/21/18 0550 02/21/18 0911  BP: 117/61  112/71   Pulse: 66  (!) 58   Resp: 18 (!) 22 16   Temp: 97.7 F (36.5 C)  98.2 F (36.8 C)   TempSrc: Oral  Oral   SpO2: 96%  96% 96%  Weight:   83.2 kg   Height:        Intake/Output Summary (Last 24 hours) at 02/21/2018 1000 Last data filed at 02/21/2018 0651 Gross per 24 hour  Intake 1329.95 ml  Output 550 ml  Net 779.95 ml   Filed Weights   02/20/18 0724 02/20/18 1158 02/21/18 0550  Weight: 83.9 kg 84.6 kg 83.2 kg    Telemetry    Sinus rhythm with occasional PVCs- Personally Reviewed  ECG    Sinus bradycardia without ST or T wave changes- Personally Reviewed  Physical Exam   GEN: No acute  distress.   Neck: No JVD Cardiac: RRR, no murmurs, rubs, or gallops.  Respiratory: Clear to auscultation bilaterally. GI: Soft, nontender, non-distended  MS: No edema; No deformity. Neuro:  Nonfocal  Psych: Normal affect   Labs    Chemistry Recent Labs  Lab 02/20/18 0731 02/21/18 0429  NA 141 141  K 4.2 3.8  CL 103 109  CO2 27 24  GLUCOSE 108* 106*  BUN 14 12  CREATININE 1.04 1.02  CALCIUM 9.3 8.1*  PROT  --  5.1*  ALBUMIN  --  3.0*  AST  --  16  ALT  --  19  ALKPHOS  --  53  BILITOT  --  0.9  GFRNONAA >60 >60  GFRAA >60 >60  ANIONGAP 11 8     Hematology Recent Labs  Lab 02/20/18 0731 02/21/18 0429  WBC 14.3* 8.0  RBC 5.40 4.47  HGB 16.6 13.4  HCT 48.4 41.1  MCV 89.6 91.9  MCH 30.7 30.0  MCHC 34.3 32.6  RDW 13.8 13.0  PLT 211 177    Cardiac Enzymes Recent Labs  Lab 02/20/18 1041 02/20/18 1612 02/20/18 2154 02/21/18 0429  TROPONINI <0.03 <0.03 <0.03 <0.03   No results for input(s): TROPIPOC in the last 168 hours.  BNPNo results for input(s): BNP, PROBNP in the last 168 hours.   DDimer No results for input(s): DDIMER in the last 168 hours.   Radiology    Dg Chest 2 View  Result Date: 02/20/2018 CLINICAL DATA:  Sternal chest pain woke pt from sleep at 6am. Continuous since then, Patient has a hx of HTN, hyperlipidemia, COPD, Asthma, no other complaints EXAM: CHEST - 2 VIEW COMPARISON:  12/06/2016 FINDINGS: The lungs are hyperinflated. There are significant emphysematous changes. Perihilar peribronchial thickening is noted. The heart size is normal. Chronic scarring is identified in the lung bases. No focal consolidation or pleural effusion. No pulmonary edema. Surgical clips are seen in the LEFT UPPER QUADRANT. IMPRESSION: 1.  Emphysema (ICD10-J43.9). 2. Bronchitic changes. 3. No acute abnormality. Electronically Signed   By: Nolon Nations M.D.   On: 02/20/2018 08:12    Cardiac Studies   None performed  Patient Profile     72 y.o. male  married Caucasian male without prior cardiac history who was admitted in transfer from [place] with symptoms consistent with unstable angina.  He has a history of remote tobacco abuse with COPD history of hypertension hyperlipidemia.  He is never had a heart attack or stroke.  There is no family history.  He works out frequently and [employer].  He is a retired Engineer, structural.  His enzymes have been negative and his EKG shows no acute changes.  He is remained pain-free on IV heparin and nitroglycerin for cardiac cath today.  Assessment & Plan    1: Unstable angina- on low-dose aspirin, IV heparin and nitro.  He is pain-free.  Enzymes are negative and his EKG shows no acute changes.  Scheduled for cardiac catheterization today. The patient understands that risks included but are not limited to stroke (1 in 1000), death (1 in 56), kidney failure [usually temporary] (1 in 500), bleeding (1 in 200), allergic reaction [possibly serious] (1 in 200). The patient understands and agrees to proceed  2: Essential hypertension- blood pressure controlled currently on no antihypertensive medications.  3: Hyperlipidemia- history of hyperlipidemia the total cholesterol is 126 on statin therapy  For questions or updates, please contact [place] Please consult [email or web address] for contact info under Cardiology/STEMI.      Signed, Quay Burow, MD  02/21/2018, 10:00 AM

## 2018-02-21 NOTE — Interval H&P Note (Signed)
Cath Lab Visit (complete for each Cath Lab visit)  Clinical Evaluation Leading to the Procedure:   ACS: No.  Non-ACS:    Anginal Classification: CCS IV  Anti-ischemic medical therapy: Minimal Therapy (1 class of medications)  Non-Invasive Test Results: No non-invasive testing performed  Prior CABG: No previous CABG      History and Physical Interval Note:  02/21/2018 3:31 PM  Russell Brown  has presented today for surgery, with the diagnosis of unstable angina  The various methods of treatment have been discussed with the patient and family. After consideration of risks, benefits and other options for treatment, the patient has consented to  Procedure(s): LEFT HEART CATH AND CORONARY ANGIOGRAPHY (N/A) as a surgical intervention .  The patient's history has been reviewed, patient examined, no change in status, stable for surgery.  I have reviewed the patient's chart and labs.  Questions were answered to the patient's satisfaction.     Shelva Majestic

## 2018-02-21 NOTE — Progress Notes (Signed)
Progress Note  Patient Name: Russell Brown Date of Encounter: 02/21/2018  Primary Cardiologist: Dr. Liam Rogers  Subjective   Russell Brown is a 72 y.o. male with a history of asthma, OA, HTN, HLD, COPD, OSA not on CPAP, thyroid dz.  he was admitted from [place] with chest pain responsive to nitroglycerin.  He is remained pain-free on IV heparin and nitro.  Enzymes have been negative and his EKG shows no acute changes.  He is scheduled for cardiac catheterization today.  Inpatient Medications    Scheduled Meds: . atorvastatin  10 mg Oral q1800  . dutasteride  0.5 mg Oral Daily  . fluticasone furoate-vilanterol  1 puff Inhalation Daily  . levothyroxine  75 mcg Oral QAC breakfast  . omega-3 acid ethyl esters  1 g Oral Daily  . pantoprazole  40 mg Oral Daily  . sertraline  50 mg Oral Daily  . sodium chloride flush  3 mL Intravenous Q12H   Continuous Infusions: . sodium chloride    . sodium chloride 1 mL/kg/hr (02/21/18 0500)  . heparin 1,150 Units/hr (02/21/18 0358)  . nitroGLYCERIN 10 mcg/min (02/21/18 0651)   PRN Meds: sodium chloride, acetaminophen, albuterol, ipratropium, nitroGLYCERIN, ondansetron (ZOFRAN) IV, sodium chloride flush, zolpidem   Vital Signs    Vitals:   02/20/18 1957 02/20/18 2041 02/21/18 0550 02/21/18 0911  BP: 117/61  112/71   Pulse: 66  (!) 58   Resp: 18 (!) 22 16   Temp: 97.7 F (36.5 C)  98.2 F (36.8 C)   TempSrc: Oral  Oral   SpO2: 96%  96% 96%  Weight:   83.2 kg   Height:        Intake/Output Summary (Last 24 hours) at 02/21/2018 1000 Last data filed at 02/21/2018 0651 Gross per 24 hour  Intake 1329.95 ml  Output 550 ml  Net 779.95 ml   Filed Weights   02/20/18 0724 02/20/18 1158 02/21/18 0550  Weight: 83.9 kg 84.6 kg 83.2 kg    Telemetry    Sinus rhythm with occasional PVCs- Personally Reviewed  ECG    Sinus bradycardia without ST or T wave changes- Personally Reviewed  Physical Exam   GEN: No acute  distress.   Neck: No JVD Cardiac: RRR, no murmurs, rubs, or gallops.  Respiratory: Clear to auscultation bilaterally. GI: Soft, nontender, non-distended  MS: No edema; No deformity. Neuro:  Nonfocal  Psych: Normal affect   Labs    Chemistry Recent Labs  Lab 02/20/18 0731 02/21/18 0429  NA 141 141  K 4.2 3.8  CL 103 109  CO2 27 24  GLUCOSE 108* 106*  BUN 14 12  CREATININE 1.04 1.02  CALCIUM 9.3 8.1*  PROT  --  5.1*  ALBUMIN  --  3.0*  AST  --  16  ALT  --  19  ALKPHOS  --  53  BILITOT  --  0.9  GFRNONAA >60 >60  GFRAA >60 >60  ANIONGAP 11 8     Hematology Recent Labs  Lab 02/20/18 0731 02/21/18 0429  WBC 14.3* 8.0  RBC 5.40 4.47  HGB 16.6 13.4  HCT 48.4 41.1  MCV 89.6 91.9  MCH 30.7 30.0  MCHC 34.3 32.6  RDW 13.8 13.0  PLT 211 177    Cardiac Enzymes Recent Labs  Lab 02/20/18 1041 02/20/18 1612 02/20/18 2154 02/21/18 0429  TROPONINI <0.03 <0.03 <0.03 <0.03   No results for input(s): TROPIPOC in the last 168 hours.  BNPNo results for input(s): BNP, PROBNP in the last 168 hours.   DDimer No results for input(s): DDIMER in the last 168 hours.   Radiology    Dg Chest 2 View  Result Date: 02/20/2018 CLINICAL DATA:  Sternal chest pain woke pt from sleep at 6am. Continuous since then, Patient has a hx of HTN, hyperlipidemia, COPD, Asthma, no other complaints EXAM: CHEST - 2 VIEW COMPARISON:  12/06/2016 FINDINGS: The lungs are hyperinflated. There are significant emphysematous changes. Perihilar peribronchial thickening is noted. The heart size is normal. Chronic scarring is identified in the lung bases. No focal consolidation or pleural effusion. No pulmonary edema. Surgical clips are seen in the LEFT UPPER QUADRANT. IMPRESSION: 1.  Emphysema (ICD10-J43.9). 2. Bronchitic changes. 3. No acute abnormality. Electronically Signed   By: Nolon Nations M.D.   On: 02/20/2018 08:12    Cardiac Studies   None performed  Patient Profile     72 y.o. male  married Caucasian male without prior cardiac history who was admitted in transfer from [place] with symptoms consistent with unstable angina.  He has a history of remote tobacco abuse with COPD history of hypertension hyperlipidemia.  He is never had a heart attack or stroke.  There is no family history.  He works out frequently and [employer].  He is a retired Engineer, structural.  His enzymes have been negative and his EKG shows no acute changes.  He is remained pain-free on IV heparin and nitroglycerin for cardiac cath today.  Assessment & Plan    1: Unstable angina- on low-dose aspirin, IV heparin and nitro.  He is pain-free.  Enzymes are negative and his EKG shows no acute changes.  Scheduled for cardiac catheterization today. The patient understands that risks included but are not limited to stroke (1 in 1000), death (1 in 49), kidney failure [usually temporary] (1 in 500), bleeding (1 in 200), allergic reaction [possibly serious] (1 in 200). The patient understands and agrees to proceed  2: Essential hypertension- blood pressure controlled currently on no antihypertensive medications.  3: Hyperlipidemia- history of hyperlipidemia the total cholesterol is 126 on statin therapy  For questions or updates, please contact [facility] Please consult [email or web address] for contact info under Cardiology/STEMI.      Signed, Quay Burow, MD  02/21/2018, 10:00 AM

## 2018-02-22 ENCOUNTER — Other Ambulatory Visit (HOSPITAL_COMMUNITY): Payer: Medicare Other

## 2018-02-22 ENCOUNTER — Other Ambulatory Visit: Payer: Self-pay | Admitting: Cardiology

## 2018-02-22 ENCOUNTER — Encounter (HOSPITAL_COMMUNITY): Payer: Self-pay | Admitting: Cardiovascular Disease

## 2018-02-22 ENCOUNTER — Telehealth: Payer: Self-pay | Admitting: *Deleted

## 2018-02-22 ENCOUNTER — Other Ambulatory Visit: Payer: Self-pay | Admitting: Cardiovascular Disease

## 2018-02-22 DIAGNOSIS — R0609 Other forms of dyspnea: Secondary | ICD-10-CM

## 2018-02-22 DIAGNOSIS — J449 Chronic obstructive pulmonary disease, unspecified: Secondary | ICD-10-CM

## 2018-02-22 DIAGNOSIS — I10 Essential (primary) hypertension: Secondary | ICD-10-CM

## 2018-02-22 DIAGNOSIS — R0683 Snoring: Secondary | ICD-10-CM

## 2018-02-22 LAB — BASIC METABOLIC PANEL
ANION GAP: 9 (ref 5–15)
BUN: 11 mg/dL (ref 8–23)
CALCIUM: 8.2 mg/dL — AB (ref 8.9–10.3)
CO2: 23 mmol/L (ref 22–32)
CREATININE: 0.91 mg/dL (ref 0.61–1.24)
Chloride: 111 mmol/L (ref 98–111)
GFR calc Af Amer: >60 mL/min (ref >60)
Glucose, Bld: 99 mg/dL (ref 70–99)
Potassium: 3.9 mmol/L (ref 3.5–5.1)
Sodium: 143 mmol/L (ref 135–145)

## 2018-02-22 LAB — CBC
HEMATOCRIT: 41.4 % (ref 39.0–52.0)
Hemoglobin: 13.4 g/dL (ref 13.0–17.0)
MCH: 29.9 pg (ref 26.0–34.0)
MCHC: 32.4 g/dL (ref 30.0–36.0)
MCV: 92.4 fL (ref 78.0–100.0)
PLATELETS: 180 10*3/uL (ref 150–400)
RBC: 4.48 MIL/uL (ref 4.22–5.81)
RDW: 13.2 % (ref 11.5–15.5)
WBC: 6.6 10*3/uL (ref 4.0–10.5)

## 2018-02-22 NOTE — Telephone Encounter (Signed)
Sleep study appointment details given to patient's wife.

## 2018-02-22 NOTE — Discharge Summary (Addendum)
Discharge Summary    Patient ID: Russell Brown,  MRN: [id number], DOB/AGE: 1946-05-19 72 y.o.  Admit date: 02/20/2018 Discharge date: 02/22/2018  Primary Care Provider: Roma Schanz R Primary Cardiologist: Dr. Acie Fredrickson  Discharge Diagnoses    Principal Problem:   Chest pain with high risk for cardiac etiology Active Problems:   Hypothyroidism   Hyperlipidemia LDL goal <100   Essential hypertension   Chest pain with moderate risk for cardiac etiology  Allergies Allergies  Allergen Reactions  . Codeine Anxiety    Reaction if taken for extended periods of time.    Diagnostic Studies/Procedures    Cath: 02/21/18   Mid RCA lesion is 25% stenosed.  The left ventricular ejection fraction is 50-55% by visual estimate.  The left ventricular systolic function is normal.  LV end diastolic pressure is normal.   Normal LV function with an ejection fraction of 50 to 55% without definitive wall motion abnormalities.  LVEDP 16 mmHg.  No significant coronary obstructive disease with a normal left main, LAD, ramus intermediate and left circumflex vessel.  The RCA is dominant and there is smooth 25% mid stenosis.   RECOMMENDATION: Medical therapy.  With the patient being awakened from sleep early morning hours there is concern for potential untreated sleep apnea resulting in transient nocturnal hypoxemia and possible coronary vasospasm versus esophageal spasm.  His chest pain was nitrate responsive.  Since his blood pressure was low in the laboratory, IC nitroglycerin was not administered in his RCA.  Recommend aggressive lipid-lowering with target LDL less than 70.  Consider reevaluation for possible obstructive sleep apnea. Recommend Aspirin 81mg  daily for moderate CAD. _____________   History of Present Illness     Russell Brown is a 72 yo male with PMH of asthma, OA, HTN, HL, COPD and OSA who was in his USOH. He goes to the gym, does weights and treadmill. Eats healthy.     He was sleeping, was wakened by crushing chest pain. Changed positions, which did not help. It was central chest pain, no change w/ deep inspiration. Did not feel more SOB than usual, no N&V or diaphoresis. 7/10, got his wife to take him to the hospital. He took ASA 81 mg x 4 at home, [facility] gave him 2 more.   He got SL NTG x 2, he started feeling better w/in a few minutes. The pain almost completely resolved before his transfer.   Never had anything like this before. He last went to the gym 6 days ago. He was on the treadmill at 3.5 mph. Always a little SOB because of the COPD but no chest pain.  Since that day, he had been active around the house without symptoms.  No LE edema, no orthopnea or PND. Had a sleep study, was told he snores, not bad enough for a CPAP.   No palpitations, no presyncope. Syncope in 2002, hospitalized in [facility], no cause found, suspected orthostatic sx.   No bleeding issues. Given his symptoms he was admitted for further work up.   Hospital Course     Given ongoing symptoms he was set up for cardiac cath noted above with normal LV function and 25% mRCA lesion. His symptoms seemed suspicious for untreated OSA. No further chest pain since admission. Recommendation for 81mg  daily given mild CAD. Post cath labs were stable. He was continued on his home medications without changes. Arrangements made for outpatient sleep study.    Russell Brown was seen by  Dr. Gwenlyn Found and determined stable for discharge home. Follow up in the office has been arranged. Medications are listed below.   _____________  Discharge Vitals Blood pressure 119/71, pulse (!) 51, temperature 98 F (36.7 C), temperature source Oral, resp. rate 13, height 6\' 1"  (1.854 m), weight 83.2 kg, SpO2 97 %.  Filed Weights   02/20/18 0724 02/20/18 1158 02/21/18 0550  Weight: 83.9 kg 84.6 kg 83.2 kg    Labs & Radiologic Studies    CBC Recent Labs    02/21/18 0429 02/22/18 0250  WBC 8.0 6.6   HGB 13.4 13.4  HCT 41.1 41.4  MCV 91.9 92.4  PLT 177 130   Basic Metabolic Panel Recent Labs    02/21/18 0429 02/22/18 0250  NA 141 143  K 3.8 3.9  CL 109 111  CO2 24 23  GLUCOSE 106* 99  BUN 12 11  CREATININE 1.02 0.91  CALCIUM 8.1* 8.2*   Liver Function Tests Recent Labs    02/21/18 0429  AST 16  ALT 19  ALKPHOS 53  BILITOT 0.9  PROT 5.1*  ALBUMIN 3.0*   No results for input(s): LIPASE, AMYLASE in the last 72 hours. Cardiac Enzymes Recent Labs    02/20/18 1612 02/20/18 2154 02/21/18 0429  TROPONINI <0.03 <0.03 <0.03   BNP Invalid input(s): POCBNP D-Dimer No results for input(s): DDIMER in the last 72 hours. Hemoglobin A1C Recent Labs    02/20/18 1612  HGBA1C 5.6   Fasting Lipid Panel Recent Labs    02/21/18 0429  CHOL 126  HDL 41  LDLCALC 68  TRIG 85  CHOLHDL 3.1   Thyroid Function Tests Recent Labs    02/20/18 1612  TSH 0.969   _____________  Dg Chest 2 View  Result Date: 02/20/2018 CLINICAL DATA:  Sternal chest pain woke pt from sleep at 6am. Continuous since then, Patient has a hx of HTN, hyperlipidemia, COPD, Asthma, no other complaints EXAM: CHEST - 2 VIEW COMPARISON:  12/06/2016 FINDINGS: The lungs are hyperinflated. There are significant emphysematous changes. Perihilar peribronchial thickening is noted. The heart size is normal. Chronic scarring is identified in the lung bases. No focal consolidation or pleural effusion. No pulmonary edema. Surgical clips are seen in the LEFT UPPER QUADRANT. IMPRESSION: 1.  Emphysema (ICD10-J43.9). 2. Bronchitic changes. 3. No acute abnormality. Electronically Signed   By: Nolon Nations M.D.   On: 02/20/2018 08:12   Disposition   Pt is being discharged home today in good condition.  Follow-up Plans & Appointments    Follow-up Information    Consuelo Pandy, PA-C Follow up on 03/07/2018.   Specialties:  Cardiology, Radiology Why:  at 9:30am for your follow up appt.  Contact  information: [address] [place] [address] [phone]          Discharge Instructions    Call MD for:  redness, tenderness, or signs of infection (pain, swelling, redness, odor or green/yellow discharge around incision site)   Complete by:  As directed    Diet - low sodium heart healthy   Complete by:  As directed    Discharge instructions   Complete by:  As directed    Radial Site Care Refer to this sheet in the next few weeks. These instructions provide you with information on caring for yourself after your procedure. Your caregiver may also give you more specific instructions. Your treatment has been planned according to current medical practices, but problems sometimes occur. Call your caregiver if you  have any problems or questions after your procedure. HOME CARE INSTRUCTIONS You may shower the day after the procedure.Remove the bandage (dressing) and gently wash the site with plain soap and water.Gently pat the site dry.  Do not apply powder or lotion to the site.  Do not submerge the affected site in water for 3 to 5 days.  Inspect the site at least twice daily.  Do not flex or bend the affected arm for 24 hours.  No lifting over 5 pounds (2.3 kg) for 5 days after your procedure.  Do not drive home if you are discharged the same day of the procedure. Have someone else drive you.  You may drive 24 hours after the procedure unless otherwise instructed by your caregiver.  What to expect: Any bruising will usually fade within 1 to 2 weeks.  Blood that collects in the tissue (hematoma) may be painful to the touch. It should usually decrease in size and tenderness within 1 to 2 weeks.  SEEK IMMEDIATE MEDICAL CARE IF: You have unusual pain at the radial site.  You have redness, warmth, swelling, or pain at the radial site.  You have drainage (other than a small amount of blood on the dressing).  You have chills.  You have a fever or persistent symptoms for more  than 72 hours.  You have a fever and your symptoms suddenly get worse.  Your arm becomes pale, cool, tingly, or numb.  You have heavy bleeding from the site. Hold pressure on the site.   Increase activity slowly   Complete by:  As directed        Discharge Medications     Medication List    STOP taking these medications   AMBULATORY NON FORMULARY MEDICATION     TAKE these medications   albuterol 108 (90 Base) MCG/ACT inhaler Commonly known as:  PROVENTIL HFA;VENTOLIN HFA Inhale 2 puffs into the lungs every 6 (six) hours as needed for wheezing or shortness of breath.   atorvastatin 10 MG tablet Commonly known as:  LIPITOR TAKE 1 TABLET BY MOUTH  DAILY AT 6 PM What changed:  See the new instructions.   Calcium Carbonate-Vitamin D 600-400 MG-UNIT chew tablet Chew 1 tablet by mouth daily. What changed:  when to take this   dutasteride 0.5 MG capsule Commonly known as:  AVODART Take 1 capsule (0.5 mg total) by mouth daily.   fish oil-omega-3 fatty acids 1000 MG capsule Take 1 g by mouth daily.   fluticasone furoate-vilanterol 200-25 MCG/INH Aepb Commonly known as:  BREO ELLIPTA Inhale 1 puff into the lungs daily.   ibuprofen 200 MG tablet Commonly known as:  ADVIL,MOTRIN Take 800 mg by mouth every 6 (six) hours as needed (pain).   ipratropium 0.03 % nasal spray Commonly known as:  ATROVENT Place 2 sprays into both nostrils 3 (three) times daily as needed for rhinitis.   levothyroxine 75 MCG tablet Commonly known as:  SYNTHROID, LEVOTHROID TAKE 1 TABLET BY MOUTH  DAILY BEFORE BREAKFAST What changed:    how much to take  how to take this  when to take this  additional instructions   lisinopril-hydrochlorothiazide 10-12.5 MG tablet Commonly known as:  PRINZIDE,ZESTORETIC Take 1 tablet by mouth daily.   multivitamin with minerals Tabs tablet Take 1 tablet by mouth daily.   omeprazole 40 MG capsule Commonly known as:  PRILOSEC Take 1 capsule (40 mg  total) by mouth daily. What changed:  when to take this   sertraline 50  MG tablet Commonly known as:  ZOLOFT Take 1 tablet (50 mg total) by mouth daily.       Acute coronary syndrome (MI, NSTEMI, STEMI, etc) this admission?: No.     Outstanding Labs/Studies   Sleep Study   Duration of Discharge Encounter   Greater than 30 minutes including physician time.  Signed, Reino Bellis NP-C 02/22/2018, 10:45 AM  Agree with note by Reino Bellis NP-C  Russell Brown was admitted with chest pain and non-STEMI.  He had positive risk factors.  He underwent cardiac catheterization revealing minimal CAD and normal LV function.  He does have symptoms of obstructive sleep apnea.  Exam is benign.  He stable for discharge home today.  He will follow-up with Dr. Acie Fredrickson.  Lorretta Harp, M.D., [address], [facility], [place] [address] [place], [place]  [address]  [phone] 02/22/2018 12:42 PM

## 2018-02-23 ENCOUNTER — Telehealth: Payer: Self-pay

## 2018-02-23 NOTE — Telephone Encounter (Signed)
[facility] follow up call made to patient. States he will not need follow up with Dr. Carollee Herter at this time but will follow up with Cardiology and he has a sleep study scheduled. States he will call office if needed,

## 2018-03-07 ENCOUNTER — Ambulatory Visit (INDEPENDENT_AMBULATORY_CARE_PROVIDER_SITE_OTHER): Payer: Medicare Other | Admitting: Cardiology

## 2018-03-07 ENCOUNTER — Encounter: Payer: Self-pay | Admitting: Cardiology

## 2018-03-07 VITALS — BP 102/60 | HR 78 | Ht 73.0 in | Wt 185.8 lb

## 2018-03-07 DIAGNOSIS — I2510 Atherosclerotic heart disease of native coronary artery without angina pectoris: Secondary | ICD-10-CM | POA: Diagnosis not present

## 2018-03-07 MED ORDER — NITROGLYCERIN 0.4 MG SL SUBL: 0.4000 mg | SUBLINGUAL_TABLET | SUBLINGUAL | 3 refills | Status: DC | PRN

## 2018-03-07 NOTE — Progress Notes (Signed)
03/07/2018 Russell Brown   07/21/1945  [id number]  Primary Physician Ann Held, DO Primary Cardiologist: Dr. Acie Fredrickson   Reason for Visit/CC: [facility] f/u for CP   HPI:  Russell Brown is a 72 y.o. male who is being seen today for post hospital f/u for CP.   To summarize, Mr.McDanielis a 72 yo male with PMH of asthma, OA, HTN, HL, COPD and untreated OSA. He goes to the gym, does weights and treadmill. Eats healthy. He presented to [facility] on 02/20/18 with new onset, cushing SSCP that occurred at night, awakening him from his sleep. He was not wearing CPAP (long history of snoring and daytime fatigue). He was given ASA and SL NTG in the ED and symptoms improved. EKG showed no acute ST abnormalities and intitial troponin was negative, but chest pain was concerning for potential cardiac etiology, thus he was admitted. Cardiac enzymes were cycled x 3 and all negative. However definative LHC was recommended and performed on 02/21/18. Cath showed No significant coronary obstructive disease with a normal left main, LAD, ramus intermediate and left circumflex vessel.  The RCA is dominant and there is smooth 25% mid stenosis. With the patient being awakened from sleep early morning hours there was concern for potential untreated sleep apnea resulting in transient nocturnal hypoxemia and possible coronary vasospasm versus esophageal spasm.  His chest pain was nitrate responsive.  Since his blood pressure was low in the laboratory, IC nitroglycerin was not administered in his RCA.  Recommendations were made for medical management w/ aggressive lipid-lowering therapy w/ target LDL < 70 mg/dL. ASA 81 mg daily also recommended for moderate CAD as well as reevaluation for possible OSA. Of note, lipid panel showed LDL to be 68 mg/dL. He was continued on Lipitor 10 mg.   He presents to clinic today for post hospital f/u. He denies any recurrent CP. He reports full compliance with ASA and Lipitor. BP is  well controlled at 102/60. Right radial cath site is stable. He has sleep study scheduled for early October and plans to f/u with his pulmonologist if he is diagnosed with OSA.    Cardiac Studies   Procedures   LEFT HEART CATH AND CORONARY ANGIOGRAPHY  Conclusion     Mid RCA lesion is 25% stenosed.  The left ventricular ejection fraction is 50-55% by visual estimate.  The left ventricular systolic function is normal.  LV end diastolic pressure is normal.   Normal LV function with an ejection fraction of 50 to 55% without definitive wall motion abnormalities.  LVEDP 16 mmHg.  No significant coronary obstructive disease with a normal left main, LAD, ramus intermediate and left circumflex vessel.  The RCA is dominant and there is smooth 25% mid stenosis.       Current Meds  Medication Sig  . albuterol (PROAIR HFA) 108 (90 Base) MCG/ACT inhaler Inhale 2 puffs into the lungs every 6 (six) hours as needed for wheezing or shortness of breath.  [name] Kitchen aspirin EC 81 MG tablet Take 81 mg by mouth daily.  [name] Kitchen atorvastatin (LIPITOR) 10 MG tablet TAKE 1 TABLET BY MOUTH  DAILY AT 6 PM  . Calcium Carbonate-Vitamin D (CALCIUM 600/VITAMIN D) 600-400 MG-UNIT per chew tablet Chew 1 tablet by mouth daily.  [name] Kitchen dutasteride (AVODART) 0.5 MG capsule Take 1 capsule (0.5 mg total) by mouth daily.  . fish oil-omega-3 fatty acids 1000 MG capsule Take 1 g by mouth daily.   . fluticasone furoate-vilanterol (BREO ELLIPTA) 200-25  MCG/INH AEPB Inhale 1 puff into the lungs daily.  [name] Kitchen ipratropium (ATROVENT) 0.03 % nasal spray Place 2 sprays into both nostrils 3 (three) times daily as needed for rhinitis.  [name] Kitchen levothyroxine (SYNTHROID, LEVOTHROID) 75 MCG tablet TAKE 1 TABLET BY MOUTH  DAILY BEFORE BREAKFAST  . lisinopril-hydrochlorothiazide (PRINZIDE,ZESTORETIC) 10-12.5 MG tablet Take 1 tablet by mouth daily.  . Multiple Vitamin (MULTIVITAMIN WITH MINERALS) TABS tablet Take 1 tablet by mouth daily.  [name] Kitchen omeprazole  (PRILOSEC) 40 MG capsule Take 1 capsule (40 mg total) by mouth daily.  . sertraline (ZOLOFT) 50 MG tablet Take 1 tablet (50 mg total) by mouth daily.   Current Facility-Administered Medications for the 03/07/18 encounter (Office Visit) with Consuelo Pandy, PA-C  Medication  . 0.9 %  sodium chloride infusion   Allergies  Allergen Reactions  . Codeine Anxiety    Reaction if taken for extended periods of time.   Past Medical History:  Diagnosis Date  . 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 Asthma    pt has albuteral inhaler.  [name] Kitchen COPD (chronic obstructive pulmonary disease) ([place])   . GERD (gastroesophageal reflux disease)   . History of hiatal hernia   . Hyperlipemia   . Hypertension   . Hypothyroidism   . Pneumonia 2013; 2017  . PTSD (post-traumatic stress disorder)    retired Engineer, structural   . Sleep apnea    mild - but does not wear a c-pap per pt  . Thyroid disease    thyroid nodules - half of thyroid was removed per pt   Family History  Problem Relation Age of Onset  . Parkinsonism Mother   . Osteoporosis Mother   . Pneumonia Mother        110  . Cancer Father        prostate,  skin  . Hyperlipidemia Father   . Hypertension Father   . Prostate cancer Father   . Asthma Son   . Osteoporosis Paternal Aunt   . Alzheimer's disease Paternal Uncle   . Osteoporosis Maternal Grandmother   . Cancer Maternal Grandmother        lung  . Heart disease Paternal Grandmother        MI  . Heart disease Paternal Grandfather        MI  . Colon cancer Neg Hx   . Pancreatic cancer Neg Hx   . Stomach cancer Neg Hx   . Esophageal cancer Neg Hx   . Liver cancer Neg Hx   . Rectal cancer Neg Hx    Past Surgical History:  Procedure Laterality Date  . APPENDECTOMY    . BACK SURGERY    . CARDIAC CATHETERIZATION  1980s   "no blockage at that time" (02/20/2018)  . CERVICAL SPINE SURGERY     "enlarged hole that nerves went thru"  .  FINGER AMPUTATION Left 2004   2nd and 3rd digits; "table saw injury"  . FOOT FRACTURE SURGERY Left    "heel OR; put 2 steel rods in and took them out 6 wks later"  . FRACTURE SURGERY    . HERNIA REPAIR Right 1965  . LEFT HEART CATH AND CORONARY ANGIOGRAPHY N/A 02/21/2018   Procedure: LEFT HEART CATH AND CORONARY ANGIOGRAPHY;  Surgeon: Troy Sine, MD;  Location: [facility];  Service: Cardiovascular;  Laterality: N/A;  . NASAL SEPTUM SURGERY  1986  . NISSEN FUNDOPLICATION  9485I   "w/hernia  repair"  . THYROIDECTOMY, PARTIAL  2000s  . WISDOM TOOTH EXTRACTION     Social History   Socioeconomic History  . Marital status: Married    Spouse name: Not on file  . Number of children: 4  . Years of education: Not on file  . Highest education level: Not on file  Occupational History  . Occupation: retired Corporate treasurer: [employer]  . Financial resource strain: Not on file  . Food insecurity:    Worry: Not on file    Inability: Not on file  . Transportation needs:    Medical: Not on file    Non-medical: Not on file  Tobacco Use  . Smoking status: Former Smoker    Packs/day: 1.00    Years: 35.00    Pack years: 35.00    Types: Cigarettes    Last attempt to quit: 08/18/1996    Years since quitting: 21.5  . Smokeless tobacco: Never Used  Substance and Sexual Activity  . Alcohol use: Yes    Alcohol/week: 7.0 standard drinks    Types: 7 Cans of beer per week  . Drug use: Never  . Sexual activity: Not Currently    Partners: Female  Lifestyle  . Physical activity:    Days per week: Not on file    Minutes per session: Not on file  . Stress: Not on file  Relationships  . Social connections:    Talks on phone: Not on file    Gets together: Not on file    Attends religious service: Not on file    Active member of club or organization: Not on file    Attends meetings of clubs or organizations: Not on file    Relationship status: Not on  file  . Intimate partner violence:    Fear of current or ex partner: Not on file    Emotionally abused: Not on file    Physically abused: Not on file    Forced sexual activity: Not on file  Other Topics Concern  . Not on file  Social History Narrative   Exercise- weights and walk on treadmill     Review of Systems: General: negative for chills, fever, night sweats or weight changes.  Cardiovascular: negative for chest pain, dyspnea on exertion, edema, orthopnea, palpitations, paroxysmal nocturnal dyspnea or shortness of breath Dermatological: negative for rash Respiratory: negative for cough or wheezing Urologic: negative for hematuria Abdominal: negative for nausea, vomiting, diarrhea, bright red blood per rectum, melena, or hematemesis Neurologic: negative for visual changes, syncope, or dizziness All other systems reviewed and are otherwise negative except as noted above.   Physical Exam:  Blood pressure 102/60, pulse 78, height 6\' 1"  (1.854 m), weight 185 lb 12.8 oz (84.3 kg), SpO2 98 %.  General appearance: alert, cooperative and no distress Neck: no carotid bruit and no JVD Lungs: clear to auscultation bilaterally Heart: regular rate and rhythm, S1, S2 normal, no murmur, click, rub or gallop Extremities: extremities normal, atraumatic, no cyanosis or edema Pulses: 2+ and symmetric Skin: Skin color, texture, turgor normal. No rashes or lesions Neurologic: Grossly normal  EKG not performed -- personally reviewed   ASSESSMENT AND PLAN:   1. CAD: mild, nonobstructive single vessel disease with only 25% mid RCA disease noted on recent LHC. Recommendations are for low dose ASA 81 mg daily and continuation of statin therapy w/ Lipitor for LDL reduction and plaque stabilization. He is on Lipitor 10 mg and recent  lipid panel showed controlled LDL at 68 mg/dL (goal is <70 mg/dL). His PCP will continue to follow this. His BP is controlled. No post cath complications. I encouraged pt  to continue to live active lifestyle and continue low fat, heart healthy diet. Plan yearly f/u with Dr. Acie Fredrickson.  2. ? OSA: long history of snoring and daytime fatigue. With the patient being awakened from sleep early morning hours with recent chest pain, there was concern for potential untreated sleep apnea resulting in transient nocturnal hypoxemia and possible coronary vasospasm versus esophageal spasm.  Outpatient sleep study has been recommended and scheduled for early October. Pt plans to f/u with his pulmonologist, for treatment, if he is diagnosed with OSA.   Follow-Up in 1 year w/ Dr. Acie Fredrickson.   Aldous Housel Ladoris Gene, MHS [facility] 03/07/2018 10:00 AM

## 2018-03-07 NOTE — Patient Instructions (Addendum)
Medication Instructions:  Your physician has recommended you make the following change in your medication:  1.  START Nitroglycerin AS NEEDED.  USE AS DIRECTED ON THE BOTTLE    Labwork: None ordered  Testing/Procedures: None ordered  Follow-Up: Your physician wants you to follow-up in: [place] DR. Acie Fredrickson   You will receive a reminder letter in the mail two months in advance. If you don't receive a letter, please call our office to schedule the follow-up appointment.    Any Other Special Instructions Will Be Listed Below (If Applicable).  Coronary Artery Disease, Male Coronary artery disease (CAD) is a condition in which the arteries that lead to the heart (coronary arteries) become narrow or blocked. The narrowing or blockage can lead to decreased blood flow to the heart. Prolonged reduced blood flow can cause a heart attack (myocardial infarction or MI). This condition may also be called coronary heart disease. Because CAD is the leading cause of death in men, it is important to understand what causes this condition and how it is treated. What are the causes? CAD is most often caused by atherosclerosis. This is the buildup of fat and cholesterol (plaque) on the inside of the arteries. Over time, the plaque may narrow or block the artery, reducing blood flow to the heart. Plaque can also become weak and break off within a coronary artery and cause a sudden blockage. Other less common causes of CAD include:  An embolism or blood clot in a coronary artery.  A tearing of the artery (spontaneous coronary artery dissection).  An aneurysm.  Inflammation (vasculitis) in the artery wall.  What increases the risk? The following factors may make you more likely to develop this condition:  Age. Men over age 86 are at a greater risk of CAD.  Family history of CAD.  Gender. Men often develop CAD earlier in life than women.  High blood pressure (hypertension).  Diabetes.  High  cholesterol levels.  Tobacco use.  Excessive alcohol use.  Lack of exercise.  A diet high in saturated and trans fats, such as fried food and processed meat.  Other possible risk factors include:  High stress levels.  Depression.  Obesity.  Sleep apnea.  What are the signs or symptoms? Many people do not have any symptoms during the early stages of CAD. As the condition progresses, symptoms may include:  Chest pain (angina). The pain can: ? Feel like a crushing or squeezing, or a tightness, pressure, fullness, or heaviness in the chest. ? Last more than a few minutes or can stop and recur. The pain tends to get worse with exercise or stress and to fade with rest.  Pain in the arms, neck, jaw, or back.  Unexplained heartburn or indigestion.  Shortness of breath.  Nausea or vomiting.  Sudden light-headedness.  Sudden cold sweats.  Fluttering or fast heartbeat (palpitations).  How is this diagnosed? This condition is diagnosed based on:  Your family and medical history.  A physical exam.  Tests, including: ? A test to check the electrical signals in your heart (electrocardiogram). ? Exercise stress test. This looks for signs of blockage when the heart is stressed with exercise, such as running on a treadmill. ? Pharmacologic stress test. This test looks for signs of blockage when the heart is being stressed with a medicine. ? Blood tests. ? Coronary angiogram. This is a procedure to look at the coronary arteries to see if there is any blockage. During this test, a dye  is injected into your arteries so they appear on an X-ray. ? A test that uses sound waves to take a picture of your heart (echocardiogram). ? Chest X-ray.  How is this treated? This condition may be treated by:  Healthy lifestyle changes to reduce risk factors.  Medicines such as: ? Antiplatelet medicines and blood-thinning medicines, such as aspirin. These help to prevent blood  clots. ? Nitroglycerin. ? Blood pressure medicines. ? Cholesterol-lowering medicine.  Coronary angioplasty and stenting. During this procedure, a thin, flexible tube is inserted through a blood vessel and into a blocked artery. A balloon or similar device on the end of the tube is inflated to open up the artery. In some cases, a small, mesh tube (stent) is inserted into the artery to keep it open.  Coronary artery bypass surgery. During this surgery, veins or arteries from other parts of the body are used to create a bypass around the blockage and allow blood to reach your heart.  Follow these instructions at home: Medicines  Take over-the-counter and prescription medicines only as told by your health care provider.  Do not take the following medicines unless your health care provider approves: ? NSAIDs, such as ibuprofen, naproxen, or celecoxib. ? Vitamin supplements that contain vitamin A, vitamin E, or both. Lifestyle  Follow an exercise program approved by your health care provider. Aim for 150 minutes of moderate exercise or 75 minutes of vigorous exercise each week.  Maintain a healthy weight or lose weight as approved by your health care provider.  Rest when you are tired.  Learn to manage stress or try to limit your stress. Ask your health care provider for suggestions if you need help.  Get screened for depression and seek treatment, if needed.  Do not use any products that contain nicotine or tobacco, such as cigarettes and e-cigarettes. If you need help quitting, ask your health care provider.  Do not use illegal drugs. Eating and drinking  Follow a heart-healthy diet. A dietitian can help educate you about healthy food options and changes. In general, eat plenty of fruits and vegetables, lean meats, and whole grains.  Avoid foods high in: ? Sugar. ? Salt (sodium). ? Saturated fat, such as processed or fatty meat. ? Trans fat, such as fried foods.  Use healthy  cooking methods such as roasting, grilling, broiling, baking, poaching, steaming, or stir-frying.  If you drink alcohol, and your health care provider approves, limit your alcohol intake to no more than 2 drinks per day. One drink equals 12 ounces of beer, 5 ounces of wine, or 1 ounces of hard liquor. General instructions  Manage any other health conditions, such as hypertension and diabetes. These conditions affect your heart.  Your health care provider may ask you to monitor your blood pressure. Ideally, your blood pressure should be below 130/80.  Keep all follow-up visits as told by your health care provider. This is important. Get help right away if:  You have pain in your chest, neck, arm, jaw, stomach, or back that: ? Lasts more than a few minutes. ? Is recurring. ? Is not relieved by taking medicine under your tongue (sublingualnitroglycerin).  You have too much (profuse) sweating without cause.  You have unexplained: ? Heartburn or indigestion. ? Shortness of breath or difficulty breathing. ? Fluttering or fast heartbeat (palpitations). ? Nausea or vomiting. ? Fatigue. ? Feelings of nervousness or anxiety. ? Weakness. ? Diarrhea.  You have sudden light-headedness or dizziness.  You faint.  You  feel like hurting yourself or think about taking your own life. These symptoms may represent a serious problem that is an emergency. Do not wait to see if the symptoms will go away. Get medical help right away. Call your local emergency services (911 in the [place] Do not drive yourself to the hospital. Summary  Coronary artery disease (CAD) is a process in which the arteries that lead to the heart (coronary arteries) become narrow or blocked. The narrowing or blockage can lead to a heart attack.  Many people do not have any symptoms during the early stages of CAD. This is called "silent CAD."  CAD can be treated with lifestyle changes, medicines, surgery, or a combination of  these treatments. This information is not intended to replace advice given to you by your health care provider. Make sure you discuss any questions you have with your health care provider. Document Released: 01/02/2014 Document Revised: 05/28/2016 Document Reviewed: 05/28/2016 Elsevier Interactive Patient Education  [employer].   If you need a refill on your cardiac medications before your next appointment, please call your pharmacy.

## 2018-03-21 ENCOUNTER — Telehealth: Payer: Self-pay | Admitting: *Deleted

## 2018-03-21 DIAGNOSIS — E785 Hyperlipidemia, unspecified: Secondary | ICD-10-CM

## 2018-03-21 DIAGNOSIS — R079 Chest pain, unspecified: Secondary | ICD-10-CM

## 2018-03-21 MED ORDER — ATORVASTATIN CALCIUM 20 MG PO TABS: 20.0000 mg | ORAL_TABLET | Freq: Every day | ORAL | 3 refills | Status: DC

## 2018-03-21 NOTE — Telephone Encounter (Signed)
Received letter from patient to inform provider that he had a "mild Heart Attack" on 02/20/18 and spent 2.5 days in the [facility] Cardiac Unit. He goes on to state that "the cardiologist wanted him to increase his Atorvastatin from 10 mg to 20 mg in order to stabilize the blockage". He told the Cardiologist that "you as my family doctor managed all my medications and that he would contact Dr. Carollee Herter to see if she would fax him a new Rx for 20 mg to [facility]". Patient went on to say that cardiologist concurred his MI was caused by sleep apnea and responded to the Nitroglycerin given to him at the hospital, so he has a sleep test scheduled for 03/22/18. Per provider instructions, new order for Atorvastatin 20 mg #90 sent e-script to [facility]. Called patient and informed to take two of his 10mg  daily until his 20 mg arrive; he is doing well and very happy with his medical care, provider informed/SLS 10/01

## 2018-03-22 ENCOUNTER — Encounter (HOSPITAL_BASED_OUTPATIENT_CLINIC_OR_DEPARTMENT_OTHER): Payer: Medicare Other

## 2018-03-25 ENCOUNTER — Ambulatory Visit (HOSPITAL_BASED_OUTPATIENT_CLINIC_OR_DEPARTMENT_OTHER): Payer: Medicare Other | Attending: Cardiovascular Disease | Admitting: Cardiovascular Disease

## 2018-03-25 VITALS — Ht 73.0 in | Wt 185.0 lb

## 2018-03-25 DIAGNOSIS — Z79890 Hormone replacement therapy (postmenopausal): Secondary | ICD-10-CM | POA: Insufficient documentation

## 2018-03-25 DIAGNOSIS — G4761 Periodic limb movement disorder: Secondary | ICD-10-CM | POA: Diagnosis not present

## 2018-03-25 DIAGNOSIS — R0609 Other forms of dyspnea: Secondary | ICD-10-CM | POA: Diagnosis present

## 2018-03-25 DIAGNOSIS — R0902 Hypoxemia: Secondary | ICD-10-CM | POA: Diagnosis not present

## 2018-03-25 DIAGNOSIS — G478 Other sleep disorders: Secondary | ICD-10-CM | POA: Diagnosis not present

## 2018-03-25 DIAGNOSIS — J449 Chronic obstructive pulmonary disease, unspecified: Secondary | ICD-10-CM | POA: Insufficient documentation

## 2018-03-25 DIAGNOSIS — Z79899 Other long term (current) drug therapy: Secondary | ICD-10-CM | POA: Insufficient documentation

## 2018-03-25 DIAGNOSIS — Z7982 Long term (current) use of aspirin: Secondary | ICD-10-CM | POA: Diagnosis not present

## 2018-03-25 DIAGNOSIS — I10 Essential (primary) hypertension: Secondary | ICD-10-CM | POA: Insufficient documentation

## 2018-04-12 ENCOUNTER — Telehealth: Payer: Self-pay | Admitting: Pulmonary Disease

## 2018-04-12 NOTE — Telephone Encounter (Signed)
Spoke with pt advised I would send message to Dr. Elsworth Soho. It looks like the split study is in process. RA please advise on results to split night sleep study.

## 2018-04-13 ENCOUNTER — Telehealth: Payer: Self-pay | Admitting: Pulmonary Disease

## 2018-04-13 NOTE — Telephone Encounter (Signed)
NO Please let pt know to contact their office for results

## 2018-04-13 NOTE — Telephone Encounter (Signed)
Spoke with the pt and notified of Dr Bari Mantis response  He verbalized understanding

## 2018-04-13 NOTE — Telephone Encounter (Signed)
Message stated in previous message due to the other one being closed stated that pt needed to call Dr. Evette Georges office to receive the results of his sleep study since it was ordered by their office and since they were the ones who resulted the study.   Attempted to call pt but unable to reach him. Left message for pt to return call x1

## 2018-04-13 NOTE — Telephone Encounter (Signed)
Will do. Thanks!  Called and spoke with pt's wife Malachy Mood letting her know that pt needs to contact Dr. Evette Georges office to get the results of the sleep study since they were the ones who ordered it and have resulted the study.  Malachy Mood expressed understanding and stated she would pass the message to pt. Nothing further needed.

## 2018-04-13 NOTE — Telephone Encounter (Signed)
Looked at the procedures tab and saw that the split night study was ordered by Dr. Evette Georges office and there was a comment stating for Dr. Claiborne Billings to read the study.  Dr. Elsworth Soho, please advise if you want [place] to call the sleep lab to see if they can get this added in your box based on the information stated above. Thanks!

## 2018-04-13 NOTE — Telephone Encounter (Signed)
Study done on 10/5 Pl ask sleep lab to place in my inbox I see this is in dr Evette Georges inbox - unless this was ordered by them

## 2018-04-13 NOTE — Telephone Encounter (Signed)
I am in receipt of his letter. Sorry to hear that he had a heart attack.  This is the first time hearing about this.  Since his sleep study was ordered by his heart doctors, it goes to Dr. Ellouise Newer to read who is a cardiologist.  For some reason I see that it has not been read in the past few weeks.  I will forward this to Dr. Claiborne Billings so that they can expedite the reading and get him started on a CPAP machine if required

## 2018-04-13 NOTE — Telephone Encounter (Signed)
Thank you. We have called pt to let him know that he will need to call Dr. Evette Georges office to get the results of the sleep study.

## 2018-04-14 ENCOUNTER — Telehealth: Payer: Self-pay | Admitting: *Deleted

## 2018-04-14 ENCOUNTER — Encounter (HOSPITAL_BASED_OUTPATIENT_CLINIC_OR_DEPARTMENT_OTHER): Payer: Self-pay | Admitting: Cardiovascular Disease

## 2018-04-14 NOTE — Telephone Encounter (Signed)
Patient returned a call and was given his sleep study results and recommendations. He declines medication at this time for PLMS. States his wife "kicked him out of the room" years ago because he was "running in his sleep." He states that now he sleeps alone it does't bother him. He will call back if he changes his mind.

## 2018-04-14 NOTE — Telephone Encounter (Signed)
Left message to return a call to discuss sleep study results. 

## 2018-04-14 NOTE — Procedures (Signed)
Patient Name: Russell Brown, Russell Brown Date: 03/25/2018 Gender: Male D.O.B: 26-Jul-1945 Age (years): 72 Referring Provider: Lorretta Harp Height (inches): 73 Interpreting Physician: Shelva Majestic MD, ABSM Weight (lbs): 185 RPSGT: Lanae Boast BMI: 24 MRN: [id number] Neck Size: 15.00  CLINICAL INFORMATION Sleep Study Type: NPSG  Indication for sleep study: N/A  Epworth Sleepiness Score: 2  SLEEP STUDY TECHNIQUE As per the AASM Manual for the Scoring of Sleep and Associated Events v2.3 (April 2016) with a hypopnea requiring 4% desaturations.  The channels recorded and monitored were frontal, central and occipital EEG, electrooculogram (EOG), submentalis EMG (chin), nasal and oral airflow, thoracic and abdominal wall motion, anterior tibialis EMG, snore microphone, electrocardiogram, and pulse oximetry.  MEDICATIONS     albuterol (PROAIR HFA) 108 (90 Base) MCG/ACT inhaler             aspirin EC 81 MG tablet         atorvastatin (LIPITOR) 20 MG tablet         Calcium Carbonate-Vitamin D (CALCIUM 600/VITAMIN D) 600-400 MG-UNIT per chew tablet (Expired)         dutasteride (AVODART) 0.5 MG capsule         fish oil-omega-3 fatty acids 1000 MG capsule         fluticasone furoate-vilanterol (BREO ELLIPTA) 200-25 MCG/INH AEPB         ipratropium (ATROVENT) 0.03 % nasal spray         levothyroxine (SYNTHROID, LEVOTHROID) 75 MCG tablet         lisinopril-hydrochlorothiazide (PRINZIDE,ZESTORETIC) 10-12.5 MG tablet         Multiple Vitamin (MULTIVITAMIN WITH MINERALS) TABS tablet         nitroGLYCERIN (NITROSTAT) 0.4 MG SL tablet         omeprazole (PRILOSEC) 40 MG capsule         sertraline (ZOLOFT) 50 MG tablet      Medications self-administered by patient taken the night of the study : N/A  SLEEP ARCHITECTURE The study was initiated at 10:08:27 PM and ended at 5:03:53 AM.  Sleep onset time was 50.7 minutes and the sleep efficiency was 76.9%%. The total sleep time  was 319.5 minutes.  Stage REM latency was 302.5 minutes.  The patient spent 28.2%% of the night in stage N1 sleep, 59.2%% in stage N2 sleep, 2.2%% in stage N3 and 10.5% in REM.  Alpha intrusion was absent.  Supine sleep was 83.66%.  RESPIRATORY PARAMETERS The overall apnea/hypopnea index (AHI) was 1.1 per hour. The respiratory disturbance index (RDI) was 7.1/h. There were 3 total apneas, including 3 obstructive, 0 central and 0 mixed apneas. There were 3 hypopneas and 32 RERAs.  The AHI during Stage REM sleep was 3.6 per hour.  AHI while supine was 1.3 per hour.  The mean oxygen saturation was 93.4%. The minimum SpO2 during sleep was 88.0%.  Loud snoring was noted during this study.  CARDIAC DATA The 2 lead EKG demonstrated sinus rhythm. The mean heart rate was 55.6 beats per minute. Other EKG findings include: PVCs.  LEG MOVEMENT DATA The total PLMS were 0 with a resulting PLMS index of 0.0. Associated arousal with leg movement index was 12.8 .  IMPRESSIONS - Increased upper airway resistance (UARS) without significant obstructive sleep (AHI  1.1/h; however RDI 7.1/h). - No significant central sleep apnea occurred during this study (CAI = 0.0/h). - Minimal oxygen desaturation to a nadir of 88%. - Abnormal sleep architecture with decreased slow wave sleep and  prolonged latency to REM sleep.  - The arousal index was abnormal. - The patient snored with loud snoring volume. - EKG findings include PVCs. - Clinically significant periodic limb movements did not occur during sleep. Associated arousals were significant.  DIAGNOSIS - Upper Airway Resistance Syndrome (UARS) - Periodic Limb Movement During Sleep (327.51 [G47.61 ICD-10]) - Nocturnal Hypoxemia (327.26 [G47.36 ICD-10])  RECOMMENDATIONS - At present patient does not meet criteria for CPAP. - Efforts should be made to optimize nasal and oropharyngeal patency. - Consider alternatives for the treatment of snoring.  -  Mirapex, Requip, or Sinemet for treatment of Periodic Leg Movements of Sleep. - Avoid alcohol, sedatives and other CNS depressants that may worsen sleep apnea and disrupt normal sleep architecture. - Sleep hygiene should be reviewed to assess factors that may improve sleep quality. - Weight management and regular exercise should be initiated or continued if appropriate.  [Electronically signed] 04/14/2018 07:23 AM  Shelva Majestic MD, [facility], [place], American Board of Sleep Medicine   NPI: [id number] [place] PH: [phone]   FX: [phone] [place]

## 2018-04-14 NOTE — Telephone Encounter (Signed)
-----   Message from Troy Sine, MD sent at 04/14/2018  7:27 AM EDT ----- Mariann Laster, please notify of the results.

## 2018-04-26 ENCOUNTER — Emergency Department (HOSPITAL_BASED_OUTPATIENT_CLINIC_OR_DEPARTMENT_OTHER)
Admission: EM | Admit: 2018-04-26 | Discharge: 2018-04-26 | Disposition: A | Discharge status: Home / Self Care | Payer: Medicare Other | Attending: Emergency Medicine | Admitting: Emergency Medicine

## 2018-04-26 ENCOUNTER — Other Ambulatory Visit: Payer: Self-pay

## 2018-04-26 ENCOUNTER — Ambulatory Visit: Payer: Self-pay | Admitting: *Deleted

## 2018-04-26 ENCOUNTER — Encounter (HOSPITAL_BASED_OUTPATIENT_CLINIC_OR_DEPARTMENT_OTHER): Payer: Self-pay | Admitting: Emergency Medicine

## 2018-04-26 DIAGNOSIS — E039 Hypothyroidism, unspecified: Secondary | ICD-10-CM | POA: Insufficient documentation

## 2018-04-26 DIAGNOSIS — Z87891 Personal history of nicotine dependence: Secondary | ICD-10-CM | POA: Diagnosis not present

## 2018-04-26 DIAGNOSIS — Z7982 Long term (current) use of aspirin: Secondary | ICD-10-CM | POA: Diagnosis not present

## 2018-04-26 DIAGNOSIS — J45909 Unspecified asthma, uncomplicated: Secondary | ICD-10-CM | POA: Insufficient documentation

## 2018-04-26 DIAGNOSIS — E860 Dehydration: Secondary | ICD-10-CM | POA: Insufficient documentation

## 2018-04-26 DIAGNOSIS — Z79899 Other long term (current) drug therapy: Secondary | ICD-10-CM | POA: Insufficient documentation

## 2018-04-26 DIAGNOSIS — R197 Diarrhea, unspecified: Secondary | ICD-10-CM | POA: Insufficient documentation

## 2018-04-26 DIAGNOSIS — I10 Essential (primary) hypertension: Secondary | ICD-10-CM | POA: Diagnosis not present

## 2018-04-26 LAB — COMPREHENSIVE METABOLIC PANEL
ALT: 34 U/L (ref 0–44)
AST: 33 U/L (ref 15–41)
Albumin: 4.1 g/dL (ref 3.5–5.0)
Alkaline Phosphatase: 70 U/L (ref 38–126)
Anion gap: 10 (ref 5–15)
BUN: 12 mg/dL (ref 8–23)
CHLORIDE: 101 mmol/L (ref 98–111)
CO2: 26 mmol/L (ref 22–32)
CREATININE: 0.95 mg/dL (ref 0.61–1.24)
Calcium: 8.7 mg/dL — ABNORMAL LOW (ref 8.9–10.3)
GFR calc Af Amer: >60 mL/min (ref >60)
GFR calc non Af Amer: >60 mL/min (ref >60)
GLUCOSE: 109 mg/dL — AB (ref 70–99)
Potassium: 3.7 mmol/L (ref 3.5–5.1)
Sodium: 137 mmol/L (ref 135–145)
Total Bilirubin: 0.7 mg/dL (ref 0.3–1.2)
Total Protein: 6.9 g/dL (ref 6.5–8.1)

## 2018-04-26 LAB — CBC
HCT: 49.3 % (ref 39.0–52.0)
HEMOGLOBIN: 16 g/dL (ref 13.0–17.0)
MCH: 30 pg (ref 26.0–34.0)
MCHC: 32.5 g/dL (ref 30.0–36.0)
MCV: 92.5 fL (ref 80.0–100.0)
PLATELETS: 249 10*3/uL (ref 150–400)
RBC: 5.33 MIL/uL (ref 4.22–5.81)
RDW: 13.3 % (ref 11.5–15.5)
WBC: 15.5 10*3/uL — ABNORMAL HIGH (ref 4.0–10.5)
nRBC: 0 % (ref 0.0–0.2)

## 2018-04-26 LAB — C DIFFICILE QUICK SCREEN W PCR REFLEX
C Diff antigen: NEGATIVE
C Diff interpretation: NOT DETECTED
C Diff toxin: NEGATIVE

## 2018-04-26 MED ORDER — SODIUM CHLORIDE 0.9 % IV SOLN
1000.0000 mL | INTRAVENOUS | Status: DC
  Administered 2018-04-26: 1000 mL via INTRAVENOUS

## 2018-04-26 MED ORDER — METRONIDAZOLE 500 MG PO TABS
500.0000 mg | ORAL_TABLET | Freq: Once | ORAL | Status: AC
  Administered 2018-04-26: 500 mg via ORAL
  Filled 2018-04-26: qty 1

## 2018-04-26 MED ORDER — METRONIDAZOLE 500 MG PO TABS: 500.0000 mg | ORAL_TABLET | Freq: Two times a day (BID) | ORAL | 0 refills | Status: DC

## 2018-04-26 MED ORDER — SODIUM CHLORIDE 0.9 % IV BOLUS (SEPSIS)
1000.0000 mL | Freq: Once | INTRAVENOUS | Status: AC
  Administered 2018-04-26: 1000 mL via INTRAVENOUS

## 2018-04-26 MED FILL — metroNIDAZOLE 500 MG TABS: 500 | 7 days supply | Qty: 14 | Fill #0

## 2018-04-26 NOTE — Telephone Encounter (Signed)
Pt having abd pain and diarrhea for 1 week. [place] and started having diarrhea. Next he had steak and had diarrhea and the last thing was lasagne and had diarrhea. First he thought it was food poisoning.  But it has continued. The stool is watery but has had about 20 stools last night into this morning. He is having abd pain just below his belly button on the right lower side, that is worst when he laying down. He is slightly bloated. Burping and passing lots of gas.  He has tried imodium and gas x. He also is having problems getting started urinating. He is also experiencing some dizziness and weakness. Per protocol, pt going to [place] emergency department. Will route to flow at [facility].  Reason for Disposition . Patient sounds very sick or weak to the triager . [1] SEVERE diarrhea (e.g., 7 or more times / day more than normal) AND [2] age > 60 years  Answer Assessment - Initial Assessment Questions 1. DIARRHEA SEVERITY: "How bad is the diarrhea?" "How many extra stools have you had in the past 24 hours than normal?"    - NO DIARRHEA (SCALE 0)   - MILD (SCALE 1-3): Few loose or mushy BMs; increase of 1-3 stools over normal daily number of stools; mild increase in ostomy output.   -  MODERATE (SCALE 4-7): Increase of 4-6 stools daily over normal; moderate increase in ostomy output. * SEVERE (SCALE 8-10; OR 'WORST POSSIBLE'): Increase of 7 or more stools daily over normal; moderate increase in ostomy output; incontinence.     severe 2. ONSET: "When did the diarrhea begin?"      A week ago 3. BM CONSISTENCY: "How loose or watery is the diarrhea?"      watery 4. VOMITING: "Are you also vomiting?" If so, ask: "How many times in the past 24 hours?"      no 5. ABDOMINAL PAIN: "Are you having any abdominal pain?" If yes: "What does it feel like?" (e.g., crampy, dull, intermittent, constant)      crampy 6. ABDOMINAL PAIN SEVERITY: If present, ask:  "How bad is the pain?"  (e.g., Scale 1-10; mild, moderate, or severe)   - MILD (1-3): doesn't interfere with normal activities, abdomen soft and not tender to touch    - MODERATE (4-7): interferes with normal activities or awakens from sleep, tender to touch    - SEVERE (8-10): excruciating pain, doubled over, unable to do any normal activities       Worst when laying down, 7 or 8 and 3 when sitting up 7. ORAL INTAKE: If vomiting, "Have you been able to drink liquids?" "How much fluids have you had in the past 24 hours?"     Been trying to drink 8. HYDRATION: "Any signs of dehydration?" (e.g., dry mouth [not just dry lips], too weak to stand, dizziness, new weight loss) "When did you last urinate?"     Dizziness, weak when standing a little. Last urinated about 20 mins ago and having problems starting 9. EXPOSURE: "Have you traveled to a foreign country recently?" "Have you been exposed to anyone with diarrhea?" "Could you have eaten any food that was spoiled?"     No.  10. ANTIBIOTIC USE: "Are you taking antibiotics now or have you taken antibiotics in the past 2 months?"       no 11. OTHER SYMPTOMS: "Do you have any other symptoms?" (e.g., fever,  blood in stool)       Pain in lower right side.  Protocols used: [facility]

## 2018-04-26 NOTE — ED Provider Notes (Signed)
[facility] EMERGENCY DEPARTMENT Provider Note   CSN: [id number] Arrival date & time: 04/26/18  0846     History   Chief Complaint Chief Complaint  Patient presents with  . Abdominal Pain  . Diarrhea    HPI Russell Brown is a 72 y.o. male.  HPI 72 year old male who presents the emergency department with diffuse diarrhea over the past week.  Some nausea.  Denies vomiting.  Reports crampy lower abdominal discomfort.  He states his stool is watery.  No fevers or chills.  [place] all [place]  Several other members had diarrhea the first night but his diarrhea has since persisted.  He was hospitalized 2 months ago for a cardiac event.  No history of C. difficile colitis.  No recent antibiotics.  No travel outside the [place].  Denies chills.  Reports generalized weakness.   Past Medical History:  Diagnosis Date  . 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 Asthma    pt has albuteral inhaler.  [name] Kitchen COPD (chronic obstructive pulmonary disease) ([place])   . GERD (gastroesophageal reflux disease)   . History of hiatal hernia   . Hyperlipemia   . Hypertension   . Hypothyroidism   . Pneumonia 2013; 2017  . PTSD (post-traumatic stress disorder)    retired Engineer, structural   . Sleep apnea    mild - but does not wear a c-pap per pt  . Thyroid disease    thyroid nodules - half of thyroid was removed per pt    Patient Active Problem List   Diagnosis Date Noted  . Chest pain with high risk for cardiac etiology 02/20/2018  . Chest pain with moderate risk for cardiac etiology 02/20/2018  . Preventative health care 12/06/2016  . Acute bronchitis 12/06/2016  . Dyspnea 02/19/2016  . 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 Anxiety 06/20/2012  . Head injury, closed, with brief LOC ([place]) 06/20/2012  . CAP (community acquired pneumonia) 03/03/2012  . Tinnitus 03/03/2012  . Benign prostate  hyperplasia 05/22/2009  . Asthma 05/20/2008  . COPD with asthma ([place]) 03/26/2008  . ELEVATED PROSTATE SPECIFIC ANTIGEN 10/28/2006  . Hypothyroidism 10/26/2006  . Hyperlipidemia LDL goal <100 10/26/2006  . ERECTILE DYSFUNCTION 10/26/2006  . Essential hypertension 10/26/2006  . GERD 10/26/2006    Past Surgical History:  Procedure Laterality Date  . APPENDECTOMY    . BACK SURGERY    . CARDIAC CATHETERIZATION  1980s   "no blockage at that time" (02/20/2018)  . CERVICAL SPINE SURGERY     "enlarged hole that nerves went thru"  . FINGER AMPUTATION Left 2004   2nd and 3rd digits; "table saw injury"  . FOOT FRACTURE SURGERY Left    "heel OR; put 2 steel rods in and took them out 6 wks later"  . FRACTURE SURGERY    . HERNIA REPAIR Right 1965  . LEFT HEART CATH AND CORONARY ANGIOGRAPHY N/A 02/21/2018   Procedure: LEFT HEART CATH AND CORONARY ANGIOGRAPHY;  Surgeon: Troy Sine, MD;  Location: [facility];  Service: Cardiovascular;  Laterality: N/A;  . NASAL SEPTUM SURGERY  1986  . NISSEN FUNDOPLICATION  3474Q   "w/hernia repair"  . THYROIDECTOMY, PARTIAL  2000s  . WISDOM TOOTH EXTRACTION          Home Medications    Prior to Admission medications   Medication Sig Start Date  End Date Taking? Authorizing Provider  albuterol (PROAIR HFA) 108 (90 Base) MCG/ACT inhaler Inhale 2 puffs into the lungs every 6 (six) hours as needed for wheezing or shortness of breath. 02/19/16   Juanito Doom, MD  aspirin EC 81 MG tablet Take 81 mg by mouth daily.    [provider]  atorvastatin (LIPITOR) 20 MG tablet Take 1 tablet (20 mg total) by mouth daily at 6 PM. 03/21/18   Carollee Herter, Alferd Apa, DO  Calcium Carbonate-Vitamin D (CALCIUM 600/VITAMIN D) 600-400 MG-UNIT per chew tablet Chew 1 tablet by mouth daily. 08/19/11 03/22/18  Ann Held, DO  dutasteride (AVODART) 0.5 MG capsule Take 1 capsule (0.5 mg total) by mouth daily. 12/13/17   Roma Schanz R, DO  fish  oil-omega-3 fatty acids 1000 MG capsule Take 1 g by mouth daily.     [provider]  fluticasone furoate-vilanterol (BREO ELLIPTA) 200-25 MCG/INH AEPB Inhale 1 puff into the lungs daily. 02/14/18   Rigoberto Noel, MD  ipratropium (ATROVENT) 0.03 % nasal spray Place 2 sprays into both nostrils 3 (three) times daily as needed for rhinitis. 02/24/16   Juanito Doom, MD  levothyroxine (SYNTHROID, LEVOTHROID) 75 MCG tablet TAKE 1 TABLET BY MOUTH  DAILY BEFORE BREAKFAST 12/13/17   Carollee Herter, Alferd Apa, DO  lisinopril-hydrochlorothiazide (PRINZIDE,ZESTORETIC) 10-12.5 MG tablet Take 1 tablet by mouth daily. 12/13/17   Ann Held, DO  metroNIDAZOLE (FLAGYL) 500 MG tablet Take 1 tablet (500 mg total) by mouth 2 (two) times daily. 04/26/18   Jola Schmidt, MD  Multiple Vitamin (MULTIVITAMIN WITH MINERALS) TABS tablet Take 1 tablet by mouth daily.    [provider]  nitroGLYCERIN (NITROSTAT) 0.4 MG SL tablet Place 1 tablet (0.4 mg total) under the tongue every 5 (five) minutes as needed. 03/07/18 06/05/18  Lyda Jester M, PA-C  omeprazole (PRILOSEC) 40 MG capsule Take 1 capsule (40 mg total) by mouth daily. 08/09/17   Levin Erp, PA  sertraline (ZOLOFT) 50 MG tablet Take 1 tablet (50 mg total) by mouth daily. 01/12/18   Ann Held, DO    Family History Family History  Problem Relation Age of Onset  . Parkinsonism Mother   . Osteoporosis Mother   . Pneumonia Mother        78  . Cancer Father        prostate,  skin  . Hyperlipidemia Father   . Hypertension Father   . Prostate cancer Father   . Asthma Son   . Osteoporosis Paternal Aunt   . Alzheimer's disease Paternal Uncle   . Osteoporosis Maternal Grandmother   . Cancer Maternal Grandmother        lung  . Heart disease Paternal Grandmother        MI  . Heart disease Paternal Grandfather        MI  . Colon cancer Neg Hx   . Pancreatic cancer Neg Hx   . Stomach cancer Neg Hx   .  Esophageal cancer Neg Hx   . Liver cancer Neg Hx   . Rectal cancer Neg Hx     Social History Social History   Tobacco Use  . Smoking status: Former Smoker    Packs/day: 1.00    Years: 35.00    Pack years: 35.00    Types: Cigarettes    Last attempt to quit: 08/18/1996    Years since quitting: 21.7  . Smokeless tobacco: Never Used  Substance  Use Topics  . Alcohol use: Yes    Alcohol/week: 7.0 standard drinks    Types: 7 Cans of beer per week  . Drug use: Never     Allergies   Codeine   Review of Systems Review of Systems  All other systems reviewed and are negative.    Physical Exam Updated Vital Signs BP 121/74   Pulse 60   Temp 98.1 F (36.7 C) (Oral)   Resp 14   Ht 6\' 1"  (1.854 m)   Wt 83.9 kg   SpO2 96%   BMI 24.41 kg/m   Physical Exam  Constitutional: He is oriented to person, place, and time. He appears well-developed and well-nourished.  HENT:  Head: Normocephalic and atraumatic.  Eyes: EOM are normal.  Neck: Normal range of motion.  Cardiovascular: Normal rate, regular rhythm, normal heart sounds and intact distal pulses.  Pulmonary/Chest: Effort normal and breath sounds normal. No respiratory distress.  Abdominal: Soft. He exhibits no distension. There is no tenderness.  Musculoskeletal: Normal range of motion.  Neurological: He is alert and oriented to person, place, and time.  Skin: Skin is warm and dry.  Psychiatric: He has a normal mood and affect. Judgment normal.  Nursing note and vitals reviewed.    ED Treatments / Results  Labs (all labs ordered are listed, but only abnormal results are displayed) Labs Reviewed  CBC - Abnormal; Notable for the following components:      Result Value   WBC 15.5 (*)    All other components within normal limits  COMPREHENSIVE METABOLIC PANEL - Abnormal; Notable for the following components:   Glucose, Bld 109 (*)    Calcium 8.7 (*)    All other components within normal limits  C DIFFICILE QUICK  SCREEN W PCR REFLEX    EKG None  Radiology No results found.  Procedures Procedures (including critical care time)  Medications Ordered in ED Medications  sodium chloride 0.9 % bolus 1,000 mL (0 mLs Intravenous Stopped 04/26/18 1033)    Followed by  0.9 %  sodium chloride infusion (0 mLs Intravenous Stopped 04/26/18 1248)  metroNIDAZOLE (FLAGYL) tablet 500 mg (500 mg Oral Given 04/26/18 1247)     Initial Impression / Assessment and Plan / ED Course  I have reviewed the triage vital signs and the nursing notes.  Pertinent labs & imaging results that were available during my care of the patient were reviewed by me and considered in my medical decision making (see chart for details).     No significant abdominal tenderness on examination.  No indication for acute imaging.  Suspected infectious diarrhea.  C. difficile pending.  Patient will be initiated on Flagyl given the persistence and quantity of his diarrheal episodes.  Outpatient GI follow-up.  Strict handwashing precautions given for the household.  Patient understands return to the ER for new or worsening symptoms  Final Clinical Impressions(s) / ED Diagnoses   Final diagnoses:  Diarrhea of presumed infectious origin  Acute dehydration    ED Discharge Orders         Ordered    metroNIDAZOLE (FLAGYL) 500 MG tablet  2 times daily     04/26/18 1317           Jola Schmidt, MD 04/26/18 1319

## 2018-04-26 NOTE — ED Triage Notes (Signed)
Pt reports low abd pain and watery diarrhea 10-20x per day x 1 week. Denies vomiting.

## 2018-04-28 ENCOUNTER — Other Ambulatory Visit (INDEPENDENT_AMBULATORY_CARE_PROVIDER_SITE_OTHER): Payer: Medicare Other

## 2018-04-28 ENCOUNTER — Encounter: Payer: Self-pay | Admitting: Physician Assistant

## 2018-04-28 ENCOUNTER — Ambulatory Visit (INDEPENDENT_AMBULATORY_CARE_PROVIDER_SITE_OTHER): Payer: Medicare Other | Admitting: Physician Assistant

## 2018-04-28 VITALS — BP 102/58 | HR 91 | Ht 73.0 in | Wt 186.0 lb

## 2018-04-28 DIAGNOSIS — R197 Diarrhea, unspecified: Secondary | ICD-10-CM | POA: Diagnosis not present

## 2018-04-28 DIAGNOSIS — R1031 Right lower quadrant pain: Secondary | ICD-10-CM

## 2018-04-28 DIAGNOSIS — R1032 Left lower quadrant pain: Secondary | ICD-10-CM

## 2018-04-28 LAB — COMPREHENSIVE METABOLIC PANEL
ALBUMIN: 4.3 g/dL (ref 3.5–5.2)
ALK PHOS: 60 U/L (ref 39–117)
ALT: 33 U/L (ref 0–53)
AST: 27 U/L (ref 0–37)
BILIRUBIN TOTAL: 0.4 mg/dL (ref 0.2–1.2)
BUN: 20 mg/dL (ref 6–23)
CO2: 27 mEq/L (ref 19–32)
Calcium: 9 mg/dL (ref 8.4–10.5)
Chloride: 106 mEq/L (ref 96–112)
Creatinine, Ser: 1.01 mg/dL (ref 0.40–1.50)
GFR: 77.09 mL/min (ref >60.00)
GLUCOSE: 88 mg/dL (ref 70–99)
POTASSIUM: 3.9 meq/L (ref 3.5–5.1)
Sodium: 142 mEq/L (ref 135–145)
TOTAL PROTEIN: 6.7 g/dL (ref 6.0–8.3)

## 2018-04-28 NOTE — Patient Instructions (Signed)
Your provider has requested that you go to the basement level for lab work before leaving today. Press "B" on the elevator. The lab is located at the first door on the left as you exit the elevator. Finish the Metronidazole as prescribed.  Call our office if symptoms return at [phone], choose option 2 and ask for Jennifer's nurse.   Normal BMI (Body Mass Index- based on height and weight) is between 23 and 30. Your BMI today is Body mass index is 24.54 kg/m. [name] Kitchen Please consider follow up  regarding your BMI with your Primary Care Provider.

## 2018-04-28 NOTE — Progress Notes (Signed)
Chief Complaint: Diarrhea  HPI:    Russell Brown is a 72 year old male with a past medical history as listed below including COPD, GERD and PTSD, known to Dr. Henrene Pastor, who presents to clinic today for diarrhea.    08/09/2017 office visit with me to discuss reflux.  At that time was discussed to status post Nissen fundoplication in 3329 he was having breakthrough symptoms on omeprazole 20 mg.  He was scheduled for an EGD with Dr. Henrene Pastor.  Also increased omeprazole to 40 mg.    09/19/17 at that time EGD was normal other than a finding of the prior fundoplication wrap undone.  Increased Omeprazole to 40 mg twice daily.    04/26/2018 ER visit for diffuse diarrhea for a week with some nausea.  This was accompanied by crampy lower abdominal discomfort.  Describes his stool as watery.  At that time it was discussed that he was hospitalized 2 months ago for a cardiac event.  Labs showed an elevated white count of 15.5 and otherwise normal.  C. difficile PCR was negative.  He was given Flagyl.    Today, patient presents to clinic and explains that last week he went on a fishing trip and ate lots of food that he does not normally eat including Monday when he went out for fried fish.  Tells me he will typically have diarrhea after eating a fried meal because "my wife does not normally let me eat that", but he had diarrhea that night which continued into the next day and then started being about every 30 minutes through Tuesday night, 04/25/2018.  Describes as watery with a lower abdominal cramping/discomfort rated as a 7/10.  He proceeded to the ER and they gave him fluids as well as Flagyl 500 mg twice daily which he has been on since then.  Patient took one Flagyl in the ER and has not had another bowel movement since then until this morning which was very small and more formed.  His abdominal discomfort is gradually going away rated now as a 1-2/10. Associated symptoms include a 5 pound weight loss over time of diarrhea  as well as some dizziness over the past 2 days.  He has maintained a mostly bland diet over the past few days.    Denies fever, chills, anorexia, nausea, vomiting or symptoms that awaken him from sleep.  Past Medical History:  Diagnosis Date  . 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 Asthma    pt has albuteral inhaler.  [name] Kitchen COPD (chronic obstructive pulmonary disease) ([place])   . GERD (gastroesophageal reflux disease)   . History of hiatal hernia   . Hyperlipemia   . Hypertension   . Hypothyroidism   . Pneumonia 2013; 2017  . PTSD (post-traumatic stress disorder)    retired Engineer, structural   . Sleep apnea    mild - but does not wear a c-pap per pt  . Thyroid disease    thyroid nodules - half of thyroid was removed per pt    Past Surgical History:  Procedure Laterality Date  . APPENDECTOMY    . BACK SURGERY    . CARDIAC CATHETERIZATION  1980s   "no blockage at that time" (02/20/2018)  . CERVICAL SPINE SURGERY     "enlarged hole that nerves went thru"  . FINGER AMPUTATION Left 2004   2nd and 3rd digits; "table saw injury"  . FOOT FRACTURE SURGERY Left    "  heel OR; put 2 steel rods in and took them out 6 wks later"  . FRACTURE SURGERY    . HERNIA REPAIR Right 1965  . LEFT HEART CATH AND CORONARY ANGIOGRAPHY N/A 02/21/2018   Procedure: LEFT HEART CATH AND CORONARY ANGIOGRAPHY;  Surgeon: Troy Sine, MD;  Location: [facility];  Service: Cardiovascular;  Laterality: N/A;  . NASAL SEPTUM SURGERY  1986  . NISSEN FUNDOPLICATION  3299M   "w/hernia repair"  . THYROIDECTOMY, PARTIAL  2000s  . WISDOM TOOTH EXTRACTION      Current Outpatient Medications  Medication Sig Dispense Refill  . albuterol (PROAIR HFA) 108 (90 Base) MCG/ACT inhaler Inhale 2 puffs into the lungs every 6 (six) hours as needed for wheezing or shortness of breath. 1 Inhaler 3  . aspirin EC 81 MG tablet Take 81 mg by mouth daily.    [name] Kitchen atorvastatin (LIPITOR) 20 MG  tablet Take 1 tablet (20 mg total) by mouth daily at 6 PM. 90 tablet 3  . dutasteride (AVODART) 0.5 MG capsule Take 1 capsule (0.5 mg total) by mouth daily. 90 capsule 3  . fish oil-omega-3 fatty acids 1000 MG capsule Take 1 g by mouth daily.     . fluticasone furoate-vilanterol (BREO ELLIPTA) 200-25 MCG/INH AEPB Inhale 1 puff into the lungs daily. 180 each 3  . ipratropium (ATROVENT) 0.03 % nasal spray Place 2 sprays into both nostrils 3 (three) times daily as needed for rhinitis. 90 mL 3  . levothyroxine (SYNTHROID, LEVOTHROID) 75 MCG tablet TAKE 1 TABLET BY MOUTH  DAILY BEFORE BREAKFAST 90 tablet 3  . lisinopril-hydrochlorothiazide (PRINZIDE,ZESTORETIC) 10-12.5 MG tablet Take 1 tablet by mouth daily. 90 tablet 1  . metroNIDAZOLE (FLAGYL) 500 MG tablet Take 1 tablet (500 mg total) by mouth 2 (two) times daily. 14 tablet 0  . Multiple Vitamin (MULTIVITAMIN WITH MINERALS) TABS tablet Take 1 tablet by mouth daily.    . nitroGLYCERIN (NITROSTAT) 0.4 MG SL tablet Place 1 tablet (0.4 mg total) under the tongue every 5 (five) minutes as needed. 25 tablet 3  . omeprazole (PRILOSEC) 40 MG capsule Take 1 capsule (40 mg total) by mouth daily. 90 capsule 3  . sertraline (ZOLOFT) 50 MG tablet Take 1 tablet (50 mg total) by mouth daily. 90 tablet 1  . Calcium Carbonate-Vitamin D (CALCIUM 600/VITAMIN D) 600-400 MG-UNIT per chew tablet Chew 1 tablet by mouth daily.     No current facility-administered medications for this visit.     Allergies as of 04/28/2018 - Review Complete 04/28/2018  Allergen Reaction Noted  . Codeine Anxiety 07/04/2008    Family History  Problem Relation Age of Onset  . Parkinsonism Mother   . Osteoporosis Mother   . Pneumonia Mother        61  . Cancer Father        prostate,  skin  . Hyperlipidemia Father   . Hypertension Father   . Prostate cancer Father   . Asthma Son   . Osteoporosis Paternal Aunt   . Alzheimer's disease Paternal Uncle   . Osteoporosis Maternal  Grandmother   . Cancer Maternal Grandmother        lung  . Heart disease Paternal Grandmother        MI  . Heart disease Paternal Grandfather        MI  . Colon cancer Neg Hx   . Pancreatic cancer Neg Hx   . Stomach cancer Neg Hx   . Esophageal cancer Neg Hx   .  Liver cancer Neg Hx   . Rectal cancer Neg Hx     Social History   Socioeconomic History  . Marital status: Married    Spouse name: Not on file  . Number of children: 4  . Years of education: Not on file  . Highest education level: Not on file  Occupational History  . Occupation: retired Corporate treasurer: [employer]  . Financial resource strain: Not on file  . Food insecurity:    Worry: Not on file    Inability: Not on file  . Transportation needs:    Medical: Not on file    Non-medical: Not on file  Tobacco Use  . Smoking status: Former Smoker    Packs/day: 1.00    Years: 35.00    Pack years: 35.00    Types: Cigarettes    Last attempt to quit: 08/18/1996    Years since quitting: 21.7  . Smokeless tobacco: Never Used  Substance and Sexual Activity  . Alcohol use: Yes    Alcohol/week: 7.0 standard drinks    Types: 7 Cans of beer per week  . Drug use: Never  . Sexual activity: Not Currently    Partners: Female  Lifestyle  . Physical activity:    Days per week: Not on file    Minutes per session: Not on file  . Stress: Not on file  Relationships  . Social connections:    Talks on phone: Not on file    Gets together: Not on file    Attends religious service: Not on file    Active member of club or organization: Not on file    Attends meetings of clubs or organizations: Not on file    Relationship status: Not on file  . Intimate partner violence:    Fear of current or ex partner: Not on file    Emotionally abused: Not on file    Physically abused: Not on file    Forced sexual activity: Not on file  Other Topics Concern  . Not on file  Social History Narrative    Exercise- weights and walk on treadmill    Review of Systems:    Constitutional: No fever or chills Cardiovascular: No chest pain Respiratory: No SOB Gastrointestinal: See HPI and otherwise negative   Physical Exam:  Vital signs: BP (!) 102/58   Pulse 91   Ht 6\' 1"  (1.854 m)   Wt 186 lb (84.4 kg)   BMI 24.54 kg/m    Constitutional:   Very Pleasant Caucasian male appears to be in NAD, Well developed, Well nourished, alert and cooperative Respiratory: Respirations even and unlabored. Lungs clear to auscultation bilaterally.   No wheezes, crackles, or rhonchi.  Cardiovascular: Normal S1, S2. No MRG. Regular rate and rhythm. No peripheral edema, cyanosis or pallor.  Gastrointestinal:  Soft, nondistended, nontender. No rebound or guarding. Normal bowel sounds. No appreciable masses or hepatomegaly. Psychiatric: Demonstrates good judgement and reason without abnormal affect or behaviors.  RELEVANT LABS AND IMAGING: CBC    Component Value Date/Time   WBC 15.5 (H) 04/26/2018 0913   RBC 5.33 04/26/2018 0913   HGB 16.0 04/26/2018 0913   HCT 49.3 04/26/2018 0913   PLT 249 04/26/2018 0913   MCV 92.5 04/26/2018 0913   MCH 30.0 04/26/2018 0913   MCHC 32.5 04/26/2018 0913   RDW 13.3 04/26/2018 0913   LYMPHSABS 1.7 12/15/2017 0835   MONOABS 0.6 12/15/2017 0835   EOSABS 0.3 12/15/2017 0835  BASOSABS 0.0 12/15/2017 0835    CMP     Component Value Date/Time   NA 137 04/26/2018 0913   K 3.7 04/26/2018 0913   CL 101 04/26/2018 0913   CO2 26 04/26/2018 0913   GLUCOSE 109 (H) 04/26/2018 0913   GLUCOSE 98 04/22/2006 1057   BUN 12 04/26/2018 0913   CREATININE 0.95 04/26/2018 0913   CALCIUM 8.7 (L) 04/26/2018 0913   PROT 6.9 04/26/2018 0913   ALBUMIN 4.1 04/26/2018 0913   AST 33 04/26/2018 0913   ALT 34 04/26/2018 0913   ALKPHOS 70 04/26/2018 0913   BILITOT 0.7 04/26/2018 0913   GFRNONAA >60 04/26/2018 0913   GFRAA >60 04/26/2018 0913    Assessment: 1.  Acute diarrhea:  Developed 72 hours ago, went to the ER and is now on Flagyl 500 mg twice daily, stools have started to return to more normal as of this morning and his abdominal pain is decreasing, C. difficile testing negative; consider infectious cause versus viral 2.  Lower abdominal cramping  Plan: 1.  Instructed the patient to finish his Flagyl 500 mg twice daily as prescribed over the next 4 days 2.  Would recommend the patient maintain a bland diet until Monday of next week 3.  Ordered a CMP as patient complained of some dizziness today to check his electrolyte levels  4.  Instructed the patient to call our clinic if his symptoms return 5.  Recommend the patient increase his fluid intake to at least eight 8 ounce glasses of water per day.  He can also use Pedialyte or G2 Gatorade to help with electrolyte replacement. 6.  Patient to follow in clinic with myself or Dr. Henrene Pastor as needed in the future  Ellouise Newer, PA-C [facility] 04/28/2018, 10:14 AM  Cc: Carollee Herter, Alferd Apa, *

## 2018-05-02 NOTE — Progress Notes (Signed)
Assessment and plan reviewed 

## 2018-05-10 ENCOUNTER — Other Ambulatory Visit: Payer: Self-pay | Admitting: Family Medicine

## 2018-05-10 DIAGNOSIS — I10 Essential (primary) hypertension: Secondary | ICD-10-CM

## 2018-05-10 DIAGNOSIS — F4310 Post-traumatic stress disorder, unspecified: Secondary | ICD-10-CM

## 2018-05-15 ENCOUNTER — Ambulatory Visit: Payer: Medicare Other | Admitting: Pulmonary Disease

## 2018-05-16 ENCOUNTER — Ambulatory Visit: Payer: Medicare Other | Admitting: Pulmonary Disease

## 2018-06-27 ENCOUNTER — Encounter: Payer: Self-pay | Admitting: Nurse Practitioner

## 2018-06-27 ENCOUNTER — Ambulatory Visit (INDEPENDENT_AMBULATORY_CARE_PROVIDER_SITE_OTHER): Payer: Medicare Other | Admitting: Nurse Practitioner

## 2018-06-27 ENCOUNTER — Ambulatory Visit (INDEPENDENT_AMBULATORY_CARE_PROVIDER_SITE_OTHER)
Admission: RE | Admit: 2018-06-27 | Discharge: 2018-06-27 | Disposition: A | Discharge status: Home / Self Care | Payer: Medicare Other | Source: Ambulatory Visit | Attending: Nurse Practitioner | Admitting: Nurse Practitioner

## 2018-06-27 VITALS — BP 126/70 | HR 78 | Temp 98.2°F | Ht 73.0 in | Wt 186.0 lb

## 2018-06-27 DIAGNOSIS — R0602 Shortness of breath: Secondary | ICD-10-CM

## 2018-06-27 DIAGNOSIS — J209 Acute bronchitis, unspecified: Secondary | ICD-10-CM | POA: Diagnosis not present

## 2018-06-27 MED ORDER — DOXYCYCLINE HYCLATE 100 MG PO TABS: 100.0000 mg | ORAL_TABLET | Freq: Two times a day (BID) | ORAL | 0 refills | Status: DC

## 2018-06-27 MED ORDER — LEVALBUTEROL HCL 0.63 MG/3ML IN NEBU
0.6300 mg | INHALATION_SOLUTION | Freq: Once | RESPIRATORY_TRACT | Status: AC
  Administered 2018-06-27: 0.63 mg via RESPIRATORY_TRACT

## 2018-06-27 NOTE — Assessment & Plan Note (Signed)
Will treat for bronchitis and underlying sinusitis. Will check chest x ray.  Patient Instructions  Will order doxycycline xopenex neb given in office today May take mucinex May take delsym for cough Will order chest x ray and call with results Follow up with Dr. Elsworth Soho at his first available appointment or sooner if symptoms worsen

## 2018-06-27 NOTE — Patient Instructions (Signed)
Will order doxycycline xopenex neb given in office today May take mucinex May take delsym for cough Will order chest x ray and call with results Follow up with Dr. Elsworth Soho at his first available appointment or sooner if symptoms worsen

## 2018-06-27 NOTE — Progress Notes (Signed)
@Patient  ID: Russell Brown, male    DOB: April 23, 1946, 73 y.o.   MRN: [id number]  Chief Complaint  Patient presents with  . Shortness of Breath    with chest congestion    Referring provider: Ann Held, *  HPI  73 year old male former smoker with COPD and a history of pneumonia followed by Dr. Elsworth Soho.  Tests: CXR 02/20/18>> emphysema.  Bronchitic changes.  No acute abnormality. CXR 12/06/16>> stable chest x-ray without evidence of acute cardiopulmonary process.  Severe emphysema. CXR 06/24/16>> areas of scarring.  No edema or consolidation.   OV 06/27/18 - Chest congestion Patient presents with cough and chest congestion.  Cough has been nonproductive.  He states his symptoms started 1 week ago and have progressively worsened.  He does complain of sinus congestion and postnasal drip.  He states that he has been short of breath.  He is compliant with Breo and Proventil.  He denies any fever, chest pain, or edema.  His vital signs are stable in office today.  He is afebrile today.  His O2 sats are 97% on room air.     Allergies  Allergen Reactions  . Codeine Anxiety    Reaction if taken for extended periods of time.    Immunization History  Administered Date(s) Administered  . H1N1 05/28/2008  . Influenza Whole 04/28/2007, 03/26/2008, 04/16/2009, 05/07/2010, 04/10/2011, 04/13/2012  . Influenza, High Dose Seasonal PF 03/05/2014, 03/17/2015, 03/17/2018  . Influenza, Seasonal, Injecte, Preservative Fre 03/06/2013  . Influenza,inj,Quad PF,6+ Mos 03/04/2016  . Influenza-Unspecified 03/22/2011  . Pneumococcal Conjugate-13 12/05/2014  . Pneumococcal Polysaccharide-23 04/11/2008, 04/18/2013  . Td 05/22/2009  . Zoster 05/22/2009  . Zoster Recombinat (Shingrix) 03/21/2017, 09/10/2017    Past Medical History:  Diagnosis Date  . 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 Asthma    pt has albuteral inhaler.  [name] Kitchen COPD (chronic  obstructive pulmonary disease) ([place])   . GERD (gastroesophageal reflux disease)   . History of hiatal hernia   . Hyperlipemia   . Hypertension   . Hypothyroidism   . Pneumonia 2013; 2017  . PTSD (post-traumatic stress disorder)    retired Engineer, structural   . Sleep apnea    mild - but does not wear a c-pap per pt  . Thyroid disease    thyroid nodules - half of thyroid was removed per pt    Tobacco History: Social History   Tobacco Use  Smoking Status Former Smoker  . Packs/day: 1.00  . Years: 35.00  . Pack years: 35.00  . Types: Cigarettes  . Last attempt to quit: 08/18/1996  . Years since quitting: 21.8  Smokeless Tobacco Never Used   Counseling given: Not Answered   Outpatient Encounter Medications as of 06/27/2018  Medication Sig  . albuterol (PROAIR HFA) 108 (90 Base) MCG/ACT inhaler Inhale 2 puffs into the lungs every 6 (six) hours as needed for wheezing or shortness of breath.  [name] Kitchen aspirin EC 81 MG tablet Take 81 mg by mouth daily.  [name] Kitchen atorvastatin (LIPITOR) 20 MG tablet Take 1 tablet (20 mg total) by mouth daily at 6 PM.  . dutasteride (AVODART) 0.5 MG capsule Take 1 capsule (0.5 mg total) by mouth daily.  . fish oil-omega-3 fatty acids 1000 MG capsule Take 1 g by mouth daily.   . fluticasone furoate-vilanterol (BREO ELLIPTA) 200-25 MCG/INH AEPB Inhale 1 puff into the lungs daily.  [name] Kitchen ipratropium (ATROVENT) 0.03 % nasal spray  Place 2 sprays into both nostrils 3 (three) times daily as needed for rhinitis.  [name] Kitchen levothyroxine (SYNTHROID, LEVOTHROID) 75 MCG tablet TAKE 1 TABLET BY MOUTH  DAILY BEFORE BREAKFAST  . lisinopril-hydrochlorothiazide (PRINZIDE,ZESTORETIC) 10-12.5 MG tablet TAKE 1 TABLET BY MOUTH  DAILY  . Multiple Vitamin (MULTIVITAMIN WITH MINERALS) TABS tablet Take 1 tablet by mouth daily.  [name] Kitchen omeprazole (PRILOSEC) 40 MG capsule Take 1 capsule (40 mg total) by mouth daily.  . sertraline (ZOLOFT) 50 MG tablet TAKE 1 TABLET BY MOUTH  DAILY  . Calcium Carbonate-Vitamin D  (CALCIUM 600/VITAMIN D) 600-400 MG-UNIT per chew tablet Chew 1 tablet by mouth daily.  [name] Kitchen doxycycline (VIBRA-TABS) 100 MG tablet Take 1 tablet (100 mg total) by mouth 2 (two) times daily.  . metroNIDAZOLE (FLAGYL) 500 MG tablet Take 1 tablet (500 mg total) by mouth 2 (two) times daily. (Patient not taking: Reported on 06/27/2018)  . nitroGLYCERIN (NITROSTAT) 0.4 MG SL tablet Place 1 tablet (0.4 mg total) under the tongue every 5 (five) minutes as needed.  . [EXPIRED] levalbuterol (XOPENEX) nebulizer solution 0.63 mg    No facility-administered encounter medications on file as of 06/27/2018.      Review of Systems  Review of Systems  Constitutional: Negative.   HENT: Positive for congestion, postnasal drip, sinus pressure and sinus pain.   Respiratory: Positive for cough and shortness of breath.   Cardiovascular: Negative.  Negative for chest pain, palpitations and leg swelling.  Gastrointestinal: Negative.   Allergic/Immunologic: Negative.   Neurological: Negative.   Psychiatric/Behavioral: Negative.        Physical Exam  BP 126/70 (BP Location: Right Arm, Patient Position: Sitting, Cuff Size: Normal)   Pulse 78   Temp 98.2 F (36.8 C)   Ht 6\' 1"  (1.854 m)   Wt 186 lb (84.4 kg)   SpO2 97%   BMI 24.54 kg/m   Wt Readings from Last 5 Encounters:  06/27/18 186 lb (84.4 kg)  04/28/18 186 lb (84.4 kg)  04/26/18 185 lb (83.9 kg)  03/25/18 185 lb (83.9 kg)  03/07/18 185 lb 12.8 oz (84.3 kg)     Physical Exam Vitals signs and nursing note reviewed.  Constitutional:      General: He is not in acute distress.    Appearance: He is well-developed.  Cardiovascular:     Rate and Rhythm: Normal rate and regular rhythm.  Pulmonary:     Effort: Pulmonary effort is normal.     Breath sounds: Normal breath sounds. No decreased breath sounds, wheezing or rhonchi.  Musculoskeletal:     Right lower leg: No edema.  Skin:    General: Skin is warm and dry.  Neurological:     Mental  Status: He is alert and oriented to person, place, and time.       Assessment & Plan:   Acute bronchitis Will treat for bronchitis and underlying sinusitis. Will check chest x ray.  Patient Instructions  Will order doxycycline xopenex neb given in office today May take mucinex May take delsym for cough Will order chest x ray and call with results Follow up with Dr. Elsworth Soho at his first available appointment or sooner if symptoms worsen       Fenton Foy, NP 06/27/2018

## 2018-10-27 ENCOUNTER — Other Ambulatory Visit: Payer: Self-pay | Admitting: Family Medicine

## 2018-10-27 DIAGNOSIS — F4310 Post-traumatic stress disorder, unspecified: Secondary | ICD-10-CM

## 2018-10-27 DIAGNOSIS — I10 Essential (primary) hypertension: Secondary | ICD-10-CM

## 2018-11-07 ENCOUNTER — Telehealth: Payer: Self-pay | Admitting: *Deleted

## 2018-11-07 NOTE — Telephone Encounter (Signed)
Left message on machine to call back to schedule follow up appointment.  Dr. Etter Sjogren had wanted to see him back in January.

## 2018-11-17 ENCOUNTER — Other Ambulatory Visit: Payer: Self-pay | Admitting: Family Medicine

## 2018-11-17 DIAGNOSIS — N401 Enlarged prostate with lower urinary tract symptoms: Secondary | ICD-10-CM

## 2018-11-17 DIAGNOSIS — E039 Hypothyroidism, unspecified: Secondary | ICD-10-CM

## 2019-01-20 ENCOUNTER — Other Ambulatory Visit: Payer: Self-pay | Admitting: Family Medicine

## 2019-01-20 ENCOUNTER — Other Ambulatory Visit: Payer: Self-pay | Admitting: Pulmonary Disease

## 2019-01-20 DIAGNOSIS — R079 Chest pain, unspecified: Secondary | ICD-10-CM

## 2019-01-20 DIAGNOSIS — E785 Hyperlipidemia, unspecified: Secondary | ICD-10-CM

## 2019-01-20 DIAGNOSIS — I10 Essential (primary) hypertension: Secondary | ICD-10-CM

## 2019-01-20 DIAGNOSIS — F4310 Post-traumatic stress disorder, unspecified: Secondary | ICD-10-CM

## 2019-02-10 ENCOUNTER — Other Ambulatory Visit: Payer: Self-pay | Admitting: Family Medicine

## 2019-02-10 DIAGNOSIS — N401 Enlarged prostate with lower urinary tract symptoms: Secondary | ICD-10-CM

## 2019-02-10 DIAGNOSIS — E039 Hypothyroidism, unspecified: Secondary | ICD-10-CM

## 2019-02-12 ENCOUNTER — Telehealth: Payer: Self-pay | Admitting: *Deleted

## 2019-02-12 NOTE — Telephone Encounter (Signed)
Patient called to see when can he make an appt.  He stated that he will call back later this week to schedule, because he was not near calendar to make.

## 2019-02-25 ENCOUNTER — Other Ambulatory Visit: Payer: Self-pay | Admitting: Internal Medicine

## 2019-03-07 ENCOUNTER — Encounter: Payer: Medicare Other | Admitting: Family Medicine

## 2019-03-15 ENCOUNTER — Other Ambulatory Visit: Payer: Self-pay

## 2019-03-16 ENCOUNTER — Ambulatory Visit (INDEPENDENT_AMBULATORY_CARE_PROVIDER_SITE_OTHER): Payer: Medicare Other | Admitting: Family Medicine

## 2019-03-16 ENCOUNTER — Other Ambulatory Visit: Payer: Self-pay

## 2019-03-16 ENCOUNTER — Encounter: Payer: Self-pay | Admitting: Family Medicine

## 2019-03-16 VITALS — BP 110/60 | HR 68 | Temp 97.2°F | Resp 18 | Ht 73.0 in | Wt 190.8 lb

## 2019-03-16 DIAGNOSIS — J449 Chronic obstructive pulmonary disease, unspecified: Secondary | ICD-10-CM

## 2019-03-16 DIAGNOSIS — E785 Hyperlipidemia, unspecified: Secondary | ICD-10-CM

## 2019-03-16 DIAGNOSIS — E039 Hypothyroidism, unspecified: Secondary | ICD-10-CM | POA: Diagnosis not present

## 2019-03-16 DIAGNOSIS — Z23 Encounter for immunization: Secondary | ICD-10-CM

## 2019-03-16 DIAGNOSIS — I25110 Atherosclerotic heart disease of native coronary artery with unstable angina pectoris: Secondary | ICD-10-CM | POA: Diagnosis not present

## 2019-03-16 DIAGNOSIS — I10 Essential (primary) hypertension: Secondary | ICD-10-CM

## 2019-03-16 LAB — COMPREHENSIVE METABOLIC PANEL
ALT: 28 U/L (ref 0–53)
AST: 20 U/L (ref 0–37)
Albumin: 4.3 g/dL (ref 3.5–5.2)
Alkaline Phosphatase: 74 U/L (ref 39–117)
BUN: 12 mg/dL (ref 6–23)
CO2: 29 mEq/L (ref 19–32)
Calcium: 9.5 mg/dL (ref 8.4–10.5)
Chloride: 103 mEq/L (ref 96–112)
Creatinine, Ser: 0.96 mg/dL (ref 0.40–1.50)
GFR: 76.72 mL/min (ref >60.00)
Glucose, Bld: 100 mg/dL — ABNORMAL HIGH (ref 70–99)
Potassium: 4.8 mEq/L (ref 3.5–5.1)
Sodium: 142 mEq/L (ref 135–145)
Total Bilirubin: 0.7 mg/dL (ref 0.2–1.2)
Total Protein: 6.3 g/dL (ref 6.0–8.3)

## 2019-03-16 LAB — LIPID PANEL
Cholesterol: 141 mg/dL (ref 0–200)
HDL: 41.5 mg/dL (ref >39.00)
LDL Cholesterol: 61 mg/dL (ref 0–99)
NonHDL: 99.41
Total CHOL/HDL Ratio: 3
Triglycerides: 194 mg/dL — ABNORMAL HIGH (ref 0.0–149.0)
VLDL: 38.8 mg/dL (ref 0.0–40.0)

## 2019-03-16 LAB — TSH: TSH: 1.9 u[IU]/mL (ref 0.35–4.50)

## 2019-03-16 MED ORDER — NITROGLYCERIN 0.4 MG SL SUBL: 0.4000 mg | SUBLINGUAL_TABLET | SUBLINGUAL | 3 refills | Status: DC | PRN

## 2019-03-16 MED ORDER — ALBUTEROL SULFATE HFA 108 (90 BASE) MCG/ACT IN AERS: 2.0000 | INHALATION_SPRAY | Freq: Four times a day (QID) | RESPIRATORY_TRACT | 1 refills | Status: DC | PRN

## 2019-03-16 MED FILL — ALBUTEROL SULFATE HFA 108 (: 108 (90 BAS | 25 days supply | Qty: 9 | Fill #0

## 2019-03-16 MED FILL — NITROGLYCERIN 0.4 MG TAB SL: 0.4 | 7 days supply | Qty: 25 | Fill #0

## 2019-03-16 NOTE — Progress Notes (Signed)
Patient ID: Russell Brown, male    DOB: 02/17/1946  Age: 73 y.o. MRN: [id number]    Subjective:  Subjective   HPI DALONTE Brown presents for f/u asthma and flu shot.  He had unstable angina last fall .  No recent chest pain but he has not had a f/u with cardiology.     Review of Systems  Constitutional: Negative for appetite change, diaphoresis, fatigue and unexpected weight change.  Eyes: Negative for pain, redness and visual disturbance.  Respiratory: Negative for cough, chest tightness, shortness of breath and wheezing.   Cardiovascular: Negative for chest pain, palpitations and leg swelling.  Endocrine: Negative for cold intolerance, heat intolerance, polydipsia, polyphagia and polyuria.  Genitourinary: Negative for difficulty urinating, dysuria and frequency.  Neurological: Negative for dizziness, light-headedness, numbness and headaches.    History Past Medical History:  Diagnosis Date  . 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 Asthma    pt has albuteral inhaler.  Russell Brown COPD (chronic obstructive pulmonary disease) ([place])   . GERD (gastroesophageal reflux disease)   . History of hiatal hernia   . Hyperlipemia   . Hypertension   . Hypothyroidism   . Pneumonia 2013; 2017  . PTSD (post-traumatic stress disorder)    retired Engineer, structural   . Sleep apnea    mild - but does not wear a c-pap per pt  . Thyroid disease    thyroid nodules - half of thyroid was removed per pt    He has a past surgical history that includes Appendectomy; Nissen fundoplication ([id number]); Cervical spine surgery; Wisdom tooth extraction; Hernia repair (Right, 1965); Back surgery; Foot fracture surgery (Left); Fracture surgery; Nasal septum surgery (1986); Finger amputation (Left, 2004); Thyroidectomy, partial (2000s); Cardiac catheterization (1980s); and LEFT HEART CATH AND CORONARY ANGIOGRAPHY (N/A, 02/21/2018).   His family history includes Alzheimer's disease  in his paternal uncle; Asthma in his son; Cancer in his father and maternal grandmother; Heart disease in his paternal grandfather and paternal grandmother; Hyperlipidemia in his father; Hypertension in his father; Osteoporosis in his maternal grandmother, mother, and paternal aunt; Parkinsonism in his mother; Pneumonia in his mother; Prostate cancer in his father.He reports that he quit smoking about 22 years ago. His smoking use included cigarettes. He has a 35.00 pack-year smoking history. He has never used smokeless tobacco. He reports current alcohol use of about 7.0 standard drinks of alcohol per week. He reports that he does not use drugs.  Current Outpatient Medications on File Prior to Visit  Medication Sig Dispense Refill  . aspirin EC 81 MG tablet Take 81 mg by mouth daily.    Russell Brown atorvastatin (LIPITOR) 20 MG tablet TAKE 1 TABLET BY MOUTH  DAILY AT 6 PM. 90 tablet 1  . BREO ELLIPTA 200-25 MCG/INH AEPB USE 1 INHALATION BY MOUTH  DAILY 180 each 0  . dutasteride (AVODART) 0.5 MG capsule TAKE 1 CAPSULE BY MOUTH  DAILY 90 capsule 0  . fish oil-omega-3 fatty acids 1000 MG capsule Take 1 g by mouth daily.     Russell Brown ipratropium (ATROVENT) 0.03 % nasal spray Place 2 sprays into both nostrils 3 (three) times daily as needed for rhinitis. 90 mL 3  . levothyroxine (SYNTHROID) 75 MCG tablet TAKE 1 TABLET BY MOUTH  DAILY BEFORE BREAKFAST 90 tablet 0  . lisinopril-hydrochlorothiazide (ZESTORETIC) 10-12.5 MG tablet TAKE 1 TABLET BY MOUTH  DAILY 90 tablet 1  . Multiple Vitamin (MULTIVITAMIN WITH MINERALS)  TABS tablet Take 1 tablet by mouth daily.    Russell Brown omeprazole (PRILOSEC) 40 MG capsule TAKE 1 CAPSULE BY MOUTH TWO TIMES DAILY 180 capsule 3  . sertraline (ZOLOFT) 50 MG tablet TAKE 1 TABLET BY MOUTH  DAILY 90 tablet 1   No current facility-administered medications on file prior to visit.      Objective:  Objective  Physical Exam Vitals signs and nursing note reviewed.  Constitutional:      General: He is  sleeping.     Appearance: He is well-developed.  HENT:     Head: Normocephalic and atraumatic.  Eyes:     Pupils: Pupils are equal, round, and reactive to light.  Neck:     Musculoskeletal: Normal range of motion and neck supple.     Thyroid: No thyromegaly.  Cardiovascular:     Rate and Rhythm: Normal rate and regular rhythm.     Heart sounds: No murmur.  Pulmonary:     Effort: Pulmonary effort is normal. No respiratory distress.     Breath sounds: Normal breath sounds. No wheezing or rales.  Chest:     Chest wall: No tenderness.  Musculoskeletal:        General: No tenderness.  Skin:    General: Skin is warm and dry.  Neurological:     Mental Status: He is oriented to person, place, and time.  Psychiatric:        Behavior: Behavior normal.        Thought Content: Thought content normal.        Judgment: Judgment normal.    BP 110/60 (BP Location: Right Arm, Patient Position: Sitting, Cuff Size: Normal)   Pulse 68   Temp (!) 97.2 F (36.2 C) (Temporal)   Resp 18   Ht 6\' 1"  (1.854 m)   Wt 190 lb 12.8 oz (86.5 kg)   SpO2 96%   BMI 25.17 kg/m  Wt Readings from Last 3 Encounters:  03/16/19 190 lb 12.8 oz (86.5 kg)  06/27/18 186 lb (84.4 kg)  04/28/18 186 lb (84.4 kg)     Lab Results  Component Value Date   WBC 15.5 (H) 04/26/2018   HGB 16.0 04/26/2018   HCT 49.3 04/26/2018   PLT 249 04/26/2018   GLUCOSE 88 04/28/2018   CHOL 126 02/21/2018   TRIG 85 02/21/2018   HDL 41 02/21/2018   LDLDIRECT 105.5 08/19/2011   LDLCALC 68 02/21/2018   ALT 33 04/28/2018   AST 27 04/28/2018   NA 142 04/28/2018   K 3.9 04/28/2018   CL 106 04/28/2018   CREATININE 1.01 04/28/2018   BUN 20 04/28/2018   CO2 27 04/28/2018   TSH 0.969 02/20/2018   PSA 6.92 (H) 05/20/2008   INR 1.05 02/20/2018   HGBA1C 5.6 02/20/2018   MICROALBUR <0.7 12/05/2014    Dg Chest 2 View  Result Date: 06/27/2018 CLINICAL DATA:  Shortness of breath for 7 days EXAM: CHEST - 2 VIEW COMPARISON:   02/20/2018 FINDINGS: There is bilateral mild chronic interstitial thickening. The lungs are hyperinflated likely secondary to COPD. There is no focal parenchymal opacity. There is no pleural effusion or pneumothorax. The heart and mediastinal contours are unremarkable. The osseous structures are unremarkable. IMPRESSION: No active cardiopulmonary disease. COPD. Electronically Signed   By: Kathreen Devoid   On: 06/27/2018 11:14     Assessment & Plan:  Plan  I have discontinued Henrene Dodge "DON"'s Calcium Carbonate-Vitamin D, metroNIDAZOLE, and doxycycline. I am also having him maintain  his fish oil-omega-3 fatty acids, ipratropium, multivitamin with minerals, aspirin EC, sertraline, atorvastatin, lisinopril-hydrochlorothiazide, Breo Ellipta, dutasteride, levothyroxine, omeprazole, albuterol, and nitroGLYCERIN.  Meds ordered this encounter  Medications  . albuterol (PROAIR HFA) 108 (90 Base) MCG/ACT inhaler    Sig: Inhale 2 puffs into the lungs every 6 (six) hours as needed for wheezing or shortness of breath.    Dispense:  18 g    Refill:  1  . nitroGLYCERIN (NITROSTAT) 0.4 MG SL tablet    Sig: Place 1 tablet (0.4 mg total) under the tongue every 5 (five) minutes as needed.    Dispense:  25 tablet    Refill:  3    Problem List Items Addressed This Visit      Unprioritized   COPD with asthma ([place])    Stable con't inhalers       Relevant Medications   albuterol (PROAIR HFA) 108 (90 Base) MCG/ACT inhaler   Essential hypertension    Well controlled, no changes to meds. Encouraged heart healthy diet such as the DASH diet and exercise as tolerated.       Relevant Medications   nitroGLYCERIN (NITROSTAT) 0.4 MG SL tablet   Hyperlipidemia    Tolerating statin, encouraged heart healthy diet, avoid trans fats, minimize simple carbs and saturated fats. Increase exercise as tolerated      Relevant Medications   nitroGLYCERIN (NITROSTAT) 0.4 MG SL tablet   Other Relevant Orders   Lipid  panel   Comprehensive metabolic panel   Hypothyroidism    Check labs con't meds      Relevant Orders   TSH   Unstable angina pectoris due to coronary arteriosclerosis [facility])    F/u cardiology No recent chest pain       Relevant Medications   nitroGLYCERIN (NITROSTAT) 0.4 MG SL tablet    Other Visit Diagnoses    Chronic obstructive pulmonary disease, unspecified COPD type ([place])    -  Primary   Relevant Medications   albuterol (PROAIR HFA) 108 (90 Base) MCG/ACT inhaler   Need for influenza vaccination       Relevant Orders   Flu Vaccine QUAD High Dose(Fluad) (Completed)   Need for pneumococcal vaccination       Relevant Orders   Pneumococcal polysaccharide vaccine 23-valent greater than or equal to 2yo subcutaneous/IM (Completed)      Follow-up: Return in about 6 months (around 09/13/2019) for annual exam, fasting.  Ann Held, DO

## 2019-03-16 NOTE — Patient Instructions (Signed)
Angina  Angina is extreme discomfort in the chest, neck, arm, jaw, or back. The discomfort is caused by a lack of blood in the middle layer of the heart wall (myocardium). There are four types of angina:  Stable angina. This is triggered by vigorous activity or exercise. It goes away when you rest or take angina medicine.  Unstable angina. This is a warning sign and can lead to a heart attack (acute coronary syndrome). This is a medical emergency. Symptoms come at rest and last a long time.  Microvascular angina. This affects the small coronary arteries. Symptoms include feeling tired and being short of breath.  Prinzmetal or variant angina. This is caused by a tightening (spasm) of the arteries that go to your heart. What are the causes? This condition is caused by atherosclerosis. This is the buildup of fat and cholesterol (plaque) in your arteries. The plaque may narrow or block the artery. Other causes of angina include:  Sudden tightening of the muscles of the arteries in the heart (coronary spasm).  Small artery disease (microvascular dysfunction).  Problems with any of your heart valves (heart valve disease).  A tear in an artery in your heart (coronary artery dissection).  Diseases of the heart muscle (cardiomyopathy), or other heart diseases. What increases the risk? You are more likely to develop this condition if you have:  High cholesterol.  High blood pressure (hypertension).  Diabetes.  A family history of heart disease.  An inactive (sedentary) lifestyle, or you do not exercise enough.  Depression.  Had radiation treatment to the left side of your chest. Other risk factors include:  Using tobacco.  Being obese.  Eating a diet high in saturated fats.  Being exposed to high stress or triggers of stress.  Using drugs, such as cocaine. Women have a greater risk for angina if:  They are older than 55.  They have gone through menopause (are  postmenopausal). What are the signs or symptoms? Common symptoms of this condition in both men and women may include:  Chest pain, which may: ? Feel like a crushing or squeezing in the chest, or like a tightness, pressure, fullness, or heaviness in the chest. ? Last for more than a few minutes at a time, or it may stop and come back (recur) over the course of a few minutes.  Pain in the neck, arm, jaw, or back.  Unexplained heartburn or indigestion.  Shortness of breath.  Nausea.  Sudden cold sweats. Women and people with diabetes may have unusual (atypical) symptoms, such as:  Fatigue.  Unexplained feelings of nervousness or anxiety.  Unexplained weakness.  Dizziness or fainting. How is this diagnosed? This condition may be diagnosed based on:  Your symptoms and medical history.  Electrocardiogram (ECG) to measure the electrical activity in your heart.  Blood tests.  Stress test to look for signs of blockage when your heart is stressed.  CT angiogram to examine your heart and the blood flow to it.  Coronary angiogram to check your coronary arteries for blockage. How is this treated? Angina may be treated with:  Medicines to: ? Prevent blood clots and heart attack. ? Relax blood vessels and improve blood flow to the heart (nitrates). ? Reduce blood pressure, improve the pumping action of the heart, and relax blood vessels that are spasming. ? Reduce cholesterol and help treat atherosclerosis.  A procedure to widen a narrowed or blocked coronary artery (angioplasty). A mesh tube may be placed in a coronary artery to   keep it open (coronary stenting).  Surgery to allow blood to go around a blocked artery (coronary artery bypass surgery). Follow these instructions at home: Medicines  Take over-the-counter and prescription medicines only as told by your health care provider.  Do not take the following medicines unless your health care provider approves: ? NSAIDs,  such as ibuprofen or naproxen. ? Vitamin supplements that contain vitamin A, vitamin E, or both. ? Hormone replacement therapy that contains estrogen with or without progestin. Eating and drinking   Eat a heart-healthy diet. This includes plenty of fresh fruits and vegetables, whole grains, low-fat (lean) protein, and low-fat dairy products.  Follow instructions from your health care provider about eating or drinking restrictions. Activity  Follow an exercise program approved by your health care provider.  Consider joining a cardiac rehabilitation program.  Take a break when you feel fatigued. Plan rest periods in your daily activities. Lifestyle   Do not use any products that contain nicotine or tobacco, such as cigarettes, e-cigarettes, and chewing tobacco. If you need help quitting, ask your health care provider.  If your health care provider says you can drink alcohol: ? Limit how much you use to:  0-1 drink a day for nonpregnant women.  0-2 drinks a day for men. ? Be aware of how much alcohol is in your drink. In the [place] one drink equals one 12 oz bottle of beer (355 mL), one 5 oz glass of wine (148 mL), or one 1 oz glass of hard liquor (44 mL). General instructions  Maintain a healthy weight.  Learn to manage stress.  Keep your vaccinations up to date. Get the flu (influenza) vaccine every year.  Talk to your health care provider if you feel depressed. Take a depression screening test to see if you are at risk for depression.  Work with your health care provider to manage other health conditions, such as hypertension or diabetes.  Keep all follow-up visits as told by your health care provider. This is important. Get help right away if:  You have pain in your chest, neck, arm, jaw, or back, and the pain: ? Lasts more than a few minutes. ? Is recurring. ? Is not relieved by taking medicines under the tongue (sublingual nitroglycerin). ? Increases in intensity or  frequency.  You have a lot of sweating without cause.  You have unexplained: ? Heartburn or indigestion. ? Shortness of breath or difficulty breathing. ? Nausea or vomiting. ? Fatigue. ? Feelings of nervousness or anxiety. ? Weakness.  You have sudden light-headedness or dizziness.  You faint. These symptoms may represent a serious problem that is an emergency. Do not wait to see if the symptoms will go away. Get medical help right away. Call your local emergency services (911 in the [place] Do not drive yourself to the hospital. Summary  Angina is extreme discomfort in the chest, neck, arm, jaw, or back that is caused by a lack of blood in the heart wall.  There are many symptoms of angina. They include chest pain, unexplained heartburn or indigestion, sudden cold sweats, and fatigue.  Angina may be treated with behavioral changes, medicine, or surgery.  Symptoms of angina may represent an emergency. Get medical help right away. Call your local emergency services (911 in the [place] Do not drive yourself to the hospital. This information is not intended to replace advice given to you by your health care provider. Make sure you discuss any questions you have with your health care provider.   Document Released: 06/07/2005 Document Revised: 01/23/2018 Document Reviewed: 01/23/2018 °Elsevier Patient Education © 2020 Elsevier Inc. ° °

## 2019-03-16 NOTE — Assessment & Plan Note (Signed)
Stable  con't inhalers 

## 2019-03-16 NOTE — Assessment & Plan Note (Signed)
Well controlled, no changes to meds. Encouraged heart healthy diet such as the DASH diet and exercise as tolerated.  °

## 2019-03-16 NOTE — Assessment & Plan Note (Signed)
Tolerating statin, encouraged heart healthy diet, avoid trans fats, minimize simple carbs and saturated fats. Increase exercise as tolerated 

## 2019-03-16 NOTE — Assessment & Plan Note (Signed)
F/u cardiology No recent chest pain

## 2019-03-16 NOTE — Assessment & Plan Note (Signed)
Check labs con't meds 

## 2019-03-20 NOTE — Progress Notes (Signed)
Referring-Russell Lowne Chase DO Reason for referral-chest pain  HPI: 73 year old male for evaluation of chest pain at request of [name].  Patient admitted with chest pain September 2019.  Pain was improved with nitroglycerin.  Enzymes negative.  Cardiac catheterization September 2019 showed a 25% right coronary artery and 50 to 55% ejection fraction.  Since last seen he denies palpitations or syncope.  He has occasional substernal chest pain lasting several minutes and resolving spontaneously.  No radiation.  Not pleuritic.  Not exertional.  He has some dyspnea on exertion that he attributes to COPD.  No orthopnea, PND or pedal edema.  Current Outpatient Medications  Medication Sig Dispense Refill  . albuterol (PROAIR HFA) 108 (90 Base) MCG/ACT inhaler Inhale 2 puffs into the lungs every 6 (six) hours as needed for wheezing or shortness of breath. 18 g 1  . aspirin EC 81 MG tablet Take 81 mg by mouth daily.    [name] Kitchen atorvastatin (LIPITOR) 20 MG tablet TAKE 1 TABLET BY MOUTH  DAILY AT 6 PM. 90 tablet 1  . BREO ELLIPTA 200-25 MCG/INH AEPB USE 1 INHALATION BY MOUTH  DAILY 180 each 0  . dutasteride (AVODART) 0.5 MG capsule TAKE 1 CAPSULE BY MOUTH  DAILY 90 capsule 0  . fish oil-omega-3 fatty acids 1000 MG capsule Take 1 g by mouth daily.     [name] Kitchen ipratropium (ATROVENT) 0.03 % nasal spray Place 2 sprays into both nostrils 3 (three) times daily as needed for rhinitis. 90 mL 3  . levothyroxine (SYNTHROID) 75 MCG tablet TAKE 1 TABLET BY MOUTH  DAILY BEFORE BREAKFAST 90 tablet 0  . lisinopril-hydrochlorothiazide (ZESTORETIC) 10-12.5 MG tablet TAKE 1 TABLET BY MOUTH  DAILY 90 tablet 1  . Multiple Vitamin (MULTIVITAMIN WITH MINERALS) TABS tablet Take 1 tablet by mouth daily.    . nitroGLYCERIN (NITROSTAT) 0.4 MG SL tablet Place 1 tablet (0.4 mg total) under the tongue every 5 (five) minutes as needed. 25 tablet 3  . omeprazole (PRILOSEC) 40 MG capsule TAKE 1 CAPSULE BY MOUTH TWO TIMES DAILY 180  capsule 3  . sertraline (ZOLOFT) 50 MG tablet TAKE 1 TABLET BY MOUTH  DAILY 90 tablet 1   No current facility-administered medications for this visit.     Allergies  Allergen Reactions  . Codeine Anxiety    Reaction if taken for extended periods of time.     Past Medical History:  Diagnosis Date  . 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 Asthma    pt has albuteral inhaler.  [name] Kitchen COPD (chronic obstructive pulmonary disease) ([place])   . GERD (gastroesophageal reflux disease)   . History of hiatal hernia   . Hyperlipemia   . Hypertension   . Hypothyroidism   . Pneumonia 2013; 2017  . PTSD (post-traumatic stress disorder)    retired Engineer, structural   . Sleep apnea    mild - but does not wear a c-pap per pt  . Thyroid disease    thyroid nodules - half of thyroid was removed per pt    Past Surgical History:  Procedure Laterality Date  . APPENDECTOMY    . BACK SURGERY    . CARDIAC CATHETERIZATION  1980s   "no blockage at that time" (02/20/2018)  . CERVICAL SPINE SURGERY     "enlarged hole that nerves went thru"  . FINGER AMPUTATION Left 2004   2nd and 3rd digits; "table saw injury"  .  FOOT FRACTURE SURGERY Left    "heel OR; put 2 steel rods in and took them out 6 wks later"  . FRACTURE SURGERY    . HERNIA REPAIR Right 1965  . LEFT HEART CATH AND CORONARY ANGIOGRAPHY N/A 02/21/2018   Procedure: LEFT HEART CATH AND CORONARY ANGIOGRAPHY;  Surgeon: Troy Sine, MD;  Location: [facility];  Service: Cardiovascular;  Laterality: N/A;  . NASAL SEPTUM SURGERY  1986  . NISSEN FUNDOPLICATION  [id number]   "w/hernia repair"  . THYROIDECTOMY, PARTIAL  2000s  . WISDOM TOOTH EXTRACTION      Social History   Socioeconomic History  . Marital status: Married    Spouse name: Not on file  . Number of children: 4  . Years of education: Not on file  . Highest education level: Not on file  Occupational History  . Occupation: retired Teacher, English as a foreign language: [employer]  . Financial resource strain: Not on file  . Food insecurity    Worry: Not on file    Inability: Not on file  . Transportation needs    Medical: Not on file    Non-medical: Not on file  Tobacco Use  . Smoking status: Former Smoker    Packs/day: 1.00    Years: 35.00    Pack years: 35.00    Types: Cigarettes    Quit date: 08/18/1996    Years since quitting: 22.6  . Smokeless tobacco: Never Used  Substance and Sexual Activity  . Alcohol use: Yes    Alcohol/week: 7.0 standard drinks    Types: 7 Cans of beer per week  . Drug use: Never  . Sexual activity: Not Currently    Partners: Female  Lifestyle  . Physical activity    Days per week: Not on file    Minutes per session: Not on file  . Stress: Not on file  Relationships  . Social Herbalist on phone: Not on file    Gets together: Not on file    Attends religious service: Not on file    Active member of club or organization: Not on file    Attends meetings of clubs or organizations: Not on file    Relationship status: Not on file  . Intimate partner violence    Fear of current or ex partner: Not on file    Emotionally abused: Not on file    Physically abused: Not on file    Forced sexual activity: Not on file  Other Topics Concern  . Not on file  Social History Narrative   Exercise- weights and walk on treadmill    Family History  Problem Relation Age of Onset  . Parkinsonism Mother   . Osteoporosis Mother   . Pneumonia Mother        6  . Cancer Father        prostate,  skin  . Hyperlipidemia Father   . Hypertension Father   . Prostate cancer Father   . Asthma Son   . Osteoporosis Paternal Aunt   . Alzheimer's disease Paternal Uncle   . Osteoporosis Maternal Grandmother   . Cancer Maternal Grandmother        lung  . Heart disease Paternal Grandmother        MI  . Heart disease Paternal Grandfather        MI  . Colon cancer Neg Hx   .  Pancreatic cancer Neg Hx   .  Stomach cancer Neg Hx   . Esophageal cancer Neg Hx   . Liver cancer Neg Hx   . Rectal cancer Neg Hx     ROS: no fevers or chills, productive cough, hemoptysis, dysphasia, odynophagia, melena, hematochezia, dysuria, hematuria, rash, seizure activity, orthopnea, PND, pedal edema, claudication. Remaining systems are negative.  Physical Exam:   Blood pressure 130/74, pulse 68, height 6\' 1"  (1.854 m), weight 190 lb (86.2 kg).  General:  Well developed/well nourished in NAD Skin warm/dry Patient not depressed No peripheral clubbing Back-normal HEENT-normal/normal eyelids Neck supple/normal carotid upstroke bilaterally; no bruits; no JVD; no thyromegaly chest -diminished breath sounds CV - RRR/normal S1 and S2; no murmurs, rubs or gallops;  PMI nondisplaced Abdomen -NT/ND, no HSM, no mass, + bowel sounds, positive bruit 2+ femoral pulses, no bruits Ext-no edema, chords, 2+ DP Neuro-grossly nonfocal  ECG -normal sinus rhythm at a rate of 66, no ST changes.  Personally reviewed  A/P  1 coronary artery disease-plan to continue medical therapy with aspirin and statin.  He has had some chest pain that is atypical.  His electrocardiogram shows no ST changes.  Previous catheterization revealed minor disease.  We will not pursue further ischemia evaluation at this point.  Possibly secondary to COPD.  2 hyperlipidemia-continue statin.  3 hypertension-blood pressure is controlled.  Continue present medications and follow.  4 COPD-Per primary care.  5 bruit-schedule abdominal ultrasound to exclude aneurysm.  Kirk Ruths, MD

## 2019-03-21 ENCOUNTER — Encounter: Payer: Medicare Other | Admitting: Family Medicine

## 2019-03-21 ENCOUNTER — Ambulatory Visit (INDEPENDENT_AMBULATORY_CARE_PROVIDER_SITE_OTHER): Payer: Medicare Other | Admitting: Cardiology

## 2019-03-21 ENCOUNTER — Other Ambulatory Visit: Payer: Self-pay

## 2019-03-21 ENCOUNTER — Encounter: Payer: Self-pay | Admitting: Cardiology

## 2019-03-21 VITALS — BP 130/74 | HR 68 | Ht 73.0 in | Wt 190.0 lb

## 2019-03-21 DIAGNOSIS — I10 Essential (primary) hypertension: Secondary | ICD-10-CM | POA: Diagnosis not present

## 2019-03-21 DIAGNOSIS — I2510 Atherosclerotic heart disease of native coronary artery without angina pectoris: Secondary | ICD-10-CM

## 2019-03-21 DIAGNOSIS — R0989 Other specified symptoms and signs involving the circulatory and respiratory systems: Secondary | ICD-10-CM | POA: Diagnosis not present

## 2019-03-21 DIAGNOSIS — E7800 Pure hypercholesterolemia, unspecified: Secondary | ICD-10-CM

## 2019-03-21 NOTE — Patient Instructions (Signed)
Medication Instructions:  NO CHANGE If you need a refill on your cardiac medications before your next appointment, please call your pharmacy.   Lab work: If you have labs (blood work) drawn today and your tests are completely normal, you will receive your results only by: [name] Kitchen MyChart Message (if you have MyChart) OR . A paper copy in the mail If you have any lab test that is abnormal or we need to change your treatment, we will call you to review the results.  Testing/Procedures: Your physician has requested that you have an abdominal aorta duplex. During this test, an ultrasound is used to evaluate the aorta. Allow 30 minutes for this exam. Do not eat after midnight the day before and avoid carbonated beverages AT THE [place] [facility]  Follow-Up: At [facility], you and your health needs are our priority.  As part of our continuing mission to provide you with exceptional heart care, we have created designated Provider Care Teams.  These Care Teams include your primary Cardiologist (physician) and Advanced Practice Providers (APPs -  Physician Assistants and Nurse Practitioners) who all work together to provide you with the care you need, when you need it. Your physician wants you to follow-up in: [place] will receive a reminder letter in the mail two months in advance. If you don't receive a letter, please call our office to schedule the follow-up appointment.

## 2019-03-25 ENCOUNTER — Other Ambulatory Visit: Payer: Self-pay | Admitting: Pulmonary Disease

## 2019-03-29 ENCOUNTER — Other Ambulatory Visit: Payer: Self-pay

## 2019-03-29 ENCOUNTER — Ambulatory Visit (HOSPITAL_BASED_OUTPATIENT_CLINIC_OR_DEPARTMENT_OTHER)
Admission: RE | Admit: 2019-03-29 | Discharge: 2019-03-29 | Disposition: A | Discharge status: Home / Self Care | Payer: Medicare Other | Source: Ambulatory Visit | Attending: Cardiology | Admitting: Cardiology

## 2019-03-29 DIAGNOSIS — E785 Hyperlipidemia, unspecified: Secondary | ICD-10-CM | POA: Diagnosis not present

## 2019-03-29 DIAGNOSIS — R0989 Other specified symptoms and signs involving the circulatory and respiratory systems: Secondary | ICD-10-CM | POA: Diagnosis present

## 2019-03-29 DIAGNOSIS — I10 Essential (primary) hypertension: Secondary | ICD-10-CM | POA: Insufficient documentation

## 2019-03-29 NOTE — Progress Notes (Signed)
Abdominal aorta doppler performed      03/29/19 Cardell Peach

## 2019-04-04 ENCOUNTER — Encounter: Payer: Self-pay | Admitting: Adult Health

## 2019-04-04 ENCOUNTER — Ambulatory Visit (INDEPENDENT_AMBULATORY_CARE_PROVIDER_SITE_OTHER): Payer: Medicare Other | Admitting: Adult Health

## 2019-04-04 ENCOUNTER — Ambulatory Visit (INDEPENDENT_AMBULATORY_CARE_PROVIDER_SITE_OTHER): Payer: Medicare Other

## 2019-04-04 ENCOUNTER — Other Ambulatory Visit: Payer: Self-pay

## 2019-04-04 VITALS — BP 122/60 | HR 60 | Ht 72.56 in | Wt 189.8 lb

## 2019-04-04 DIAGNOSIS — R0602 Shortness of breath: Secondary | ICD-10-CM

## 2019-04-04 DIAGNOSIS — J449 Chronic obstructive pulmonary disease, unspecified: Secondary | ICD-10-CM

## 2019-04-04 LAB — CBC WITH DIFFERENTIAL/PLATELET
Basophils Absolute: 0 10*3/uL (ref 0.0–0.1)
Basophils Relative: 0.4 % (ref 0.0–3.0)
Eosinophils Absolute: 0.1 10*3/uL (ref 0.0–0.7)
Eosinophils Relative: 1.7 % (ref 0.0–5.0)
HCT: 45.8 % (ref 39.0–52.0)
Hemoglobin: 15.1 g/dL (ref 13.0–17.0)
Lymphocytes Relative: 14.8 % (ref 12.0–46.0)
Lymphs Abs: 1.1 10*3/uL (ref 0.7–4.0)
MCHC: 33.1 g/dL (ref 30.0–36.0)
MCV: 89.8 fl (ref 78.0–100.0)
Monocytes Absolute: 0.8 10*3/uL (ref 0.1–1.0)
Monocytes Relative: 10 % (ref 3.0–12.0)
Neutro Abs: 5.7 10*3/uL (ref 1.4–7.7)
Neutrophils Relative %: 73.1 % (ref 43.0–77.0)
Platelets: 225 10*3/uL (ref 150.0–400.0)
RBC: 5.1 Mil/uL (ref 4.22–5.81)
RDW: 13.7 % (ref 11.5–15.5)
WBC: 7.7 10*3/uL (ref 4.0–10.5)

## 2019-04-04 MED ORDER — ANORO ELLIPTA 62.5-25 MCG/INH IN AEPB: 1.0000 | INHALATION_SPRAY | Freq: Every day | RESPIRATORY_TRACT | 0 refills | Status: DC

## 2019-04-04 NOTE — Addendum Note (Signed)
Addended by: Suzzanne Cloud E on: 04/04/2019 10:07 AM   Modules accepted: Orders

## 2019-04-04 NOTE — Addendum Note (Signed)
Addended by: Parke Poisson E on: 04/04/2019 10:36 AM   Modules accepted: Orders

## 2019-04-04 NOTE — Patient Instructions (Addendum)
Change BREO to ANORO 1 puff daily .  Advance Activity as tolerated  Chest xray today  Labs today .  Follow up in 3 months with Dr. Halford Chessman  or Parrett NP .  Please contact office for sooner follow up if symptoms do not improve or worsen or seek emergency care

## 2019-04-04 NOTE — Assessment & Plan Note (Signed)
Dyspnea with activity.  Suspect is related to his moderate COPD and suspected deconditioning.  Patient is advance activity as tolerated.  He has no chest tightness or chest pain with activity so low suspicion for cardiac issues. We will check chest x-ray and CBC

## 2019-04-04 NOTE — Assessment & Plan Note (Signed)
COPD/asthma overlap syndrome.  Patient does very well without any significant exacerbations.  He does seem to be having increased symptom burden with shortness of breath with activity.  Most likely due to deconditioning from not exercising over the last 6 months.  We will check a chest x-ray and CBC to rule out other etiology. Patient has mostly fixed chronic obstructive disease.  We will change Breo to Anoro a trial of this to see if this gives him any improvement in symptom burden.   Plan  Patient Instructions  Change BREO to ANORO 1 puff daily .  Advance Activity as tolerated  Chest xray today  Labs today .  Follow up in 3 months with Dr. Halford Chessman  or Damarri Rampy NP .  Please contact office for sooner follow up if symptoms do not improve or worsen or seek emergency care

## 2019-04-04 NOTE — Progress Notes (Signed)
@Patient  ID: Russell Brown, male    DOB: 09/19/1945, 73 y.o.   MRN: [id number]  Chief Complaint  Patient presents with  . 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  Last seen January 2020.  Patient has moderate COPD.  He remains on Breo.  Has no albuterol use. Says he tries to be active but has noticed over the last 6 months that he gets more winded with activities and activity tolerance has decreased.  Patient says prior to the pandemic he was exercising on a regular basis.  Has not been exercising in the last 6 to 7 months.  He does walk has no trouble walking on a flat surface.  But notices when he tries to do it incline or stairs or carry something heavy that his breathing is stressed.  He is on ACE inhibitor.  Denies any cough or wheezing. Chest x-ray January 2020 showed no acute process.  CBC November 2019 was unrevealing except for an elevated white blood cell count. Weight has been stable.  He denies any nausea vomiting diarrhea.  Has a good appetite.  He denies any chest pain, exertional chest pain, palpitation, orthopnea or edema.  Allergies  Allergen Reactions  . Codeine Anxiety    Reaction if taken for extended periods of time.    Immunization History  Administered Date(s) Administered  . Fluad Quad(high Dose 65+) 03/16/2019  . H1N1 05/28/2008  . Influenza Whole 04/28/2007, 03/26/2008, 04/16/2009, 05/07/2010, 04/10/2011, 04/13/2012  . Influenza, High Dose Seasonal PF 03/05/2014, 03/17/2015, 03/17/2018, 03/26/2019  . Influenza, Seasonal, Injecte, Preservative Fre 03/06/2013  . Influenza,inj,Quad PF,6+ Mos 03/04/2016  . Influenza-Unspecified 03/22/2011  . Pneumococcal Conjugate-13  12/05/2014  . Pneumococcal Polysaccharide-23 04/11/2008, 04/18/2013, 03/16/2019  . Td 05/22/2009  . Zoster 05/22/2009  . Zoster Recombinat (Shingrix) 03/21/2017, 09/10/2017    Past Medical History:  Diagnosis Date  . 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 Asthma    pt has albuteral inhaler.  [name] Kitchen COPD (chronic obstructive pulmonary disease) ([place])   . GERD (gastroesophageal reflux disease)   . History of hiatal hernia   . Hyperlipemia   . Hypertension   . Hypothyroidism   . Pneumonia 2013; 2017  . PTSD (post-traumatic stress disorder)    retired Engineer, structural   . Sleep apnea    mild - but does not wear a c-pap per pt  . Thyroid disease    thyroid nodules - half of thyroid was removed per pt    Tobacco History: Social History   Tobacco Use  Smoking Status Former Smoker  . Packs/day: 1.00  . Years: 35.00  . Pack years: 35.00  . Types: Cigarettes  . Quit date: 08/18/1996  . Years since quitting: 22.6  Smokeless Tobacco Never Used   Counseling given: Not Answered   Outpatient Medications Prior to Visit  Medication Sig Dispense Refill  . albuterol (PROAIR HFA) 108 (90 Base) MCG/ACT inhaler Inhale 2 puffs into the lungs every 6 (six) hours as needed for wheezing or shortness of breath. 18 g 1  . aspirin EC 81 MG tablet Take 81 mg by mouth daily.    [name] Kitchen  atorvastatin (LIPITOR) 20 MG tablet TAKE 1 TABLET BY MOUTH  DAILY AT 6 PM. 90 tablet 1  . BREO ELLIPTA 200-25 MCG/INH AEPB USE 1 INHALATION BY MOUTH  DAILY 180 each 3  . dutasteride (AVODART) 0.5 MG capsule TAKE 1 CAPSULE BY MOUTH  DAILY 90 capsule 0  . fish oil-omega-3 fatty acids 1000 MG capsule Take 2 g by mouth daily.     [name] Kitchen ipratropium (ATROVENT) 0.03 % nasal spray Place 2 sprays into both nostrils 3 (three) times daily as needed for rhinitis. 90 mL 3  . levothyroxine (SYNTHROID) 75 MCG tablet TAKE 1 TABLET BY MOUTH  DAILY BEFORE BREAKFAST 90 tablet 0  .  lisinopril-hydrochlorothiazide (ZESTORETIC) 10-12.5 MG tablet TAKE 1 TABLET BY MOUTH  DAILY 90 tablet 1  . Multiple Vitamin (MULTIVITAMIN WITH MINERALS) TABS tablet Take 1 tablet by mouth daily.    . nitroGLYCERIN (NITROSTAT) 0.4 MG SL tablet Place 1 tablet (0.4 mg total) under the tongue every 5 (five) minutes as needed. 25 tablet 3  . omeprazole (PRILOSEC) 40 MG capsule TAKE 1 CAPSULE BY MOUTH TWO TIMES DAILY 180 capsule 3  . sertraline (ZOLOFT) 50 MG tablet TAKE 1 TABLET BY MOUTH  DAILY 90 tablet 1   No facility-administered medications prior to visit.      Review of Systems:   Constitutional:   No  weight loss, night sweats,  Fevers, chills, fatigue, or  lassitude.  HEENT:   No headaches,  Difficulty swallowing,  Tooth/dental problems, or  Sore throat,                No sneezing, itching, ear ache, nasal congestion, post nasal drip,   CV:  No chest pain,  Orthopnea, PND, swelling in lower extremities, anasarca, dizziness, palpitations, syncope.   GI  No heartburn, indigestion, abdominal pain, nausea, vomiting, diarrhea, change in bowel habits, loss of appetite, bloody stools.   Resp.  No excess mucus, no productive cough,  No non-productive cough,  No coughing up of blood.  No change in color of mucus.  No wheezing.  No chest wall deformity  Skin: no rash or lesions.  GU: no dysuria, change in color of urine, no urgency or frequency.  No flank pain, no hematuria   MS:  No joint pain or swelling.  No decreased range of motion.  No back pain.    Physical Exam  BP 122/60 (BP Location: Left Arm, Cuff Size: Normal)   Pulse 60   Ht 6' 0.56" (1.843 m)   Wt 189 lb 12.8 oz (86.1 kg)   SpO2 98%   BMI 25.35 kg/m   GEN: A/Ox3; pleasant , NAD, well nourished    HEENT:  [place]/AT, , NOSE-clear, THROAT-clear, no lesions, no postnasal drip or exudate noted.   NECK:  Supple w/ fair ROM; no JVD; normal carotid impulses w/o bruits; no thyromegaly or nodules palpated; no lymphadenopathy.     RESP  Clear  P & A; w/o, wheezes/ rales/ or rhonchi. no accessory muscle use, no dullness to percussion  CARD:  RRR, no m/r/g, no peripheral edema, pulses intact, no cyanosis or clubbing.  GI:   Soft & nt; nml bowel sounds; no organomegaly or masses detected.   Musco: Warm bil, no deformities or joint swelling noted.   Neuro: alert, no focal deficits noted.    Skin: Warm, no lesions or rashes    Lab Results:  CBC    Component Value Date/Time   WBC 15.5 (H) 04/26/2018 0913   RBC  5.33 04/26/2018 0913   HGB 16.0 04/26/2018 0913   HCT 49.3 04/26/2018 0913   PLT 249 04/26/2018 0913   MCV 92.5 04/26/2018 0913   MCH 30.0 04/26/2018 0913   MCHC 32.5 04/26/2018 0913   RDW 13.3 04/26/2018 0913   LYMPHSABS 1.7 12/15/2017 0835   MONOABS 0.6 12/15/2017 0835   EOSABS 0.3 12/15/2017 0835   BASOSABS 0.0 12/15/2017 0835    BMET    Component Value Date/Time   NA 142 03/16/2019 0847   K 4.8 03/16/2019 0847   CL 103 03/16/2019 0847   CO2 29 03/16/2019 0847   GLUCOSE 100 (H) 03/16/2019 0847   GLUCOSE 98 04/22/2006 1057   BUN 12 03/16/2019 0847   CREATININE 0.96 03/16/2019 0847   CALCIUM 9.5 03/16/2019 0847   GFRNONAA >60 04/26/2018 0913   GFRAA >60 04/26/2018 0913    BNP No results found for: BNP  ProBNP    Component Value Date/Time   PROBNP 91.2 02/20/2012 1257    Imaging: Vas US Aorta Medicare Screen  Result Date: 03/29/2019 ABDOMINAL AORTA STUDY Indications: Bruit Risk Factors: Hypertension, hyperlipidemia. Limitations: Air/bowel gas.  Performing Technologist: Cardell Peach RDCS, RVT  Examination Guidelines: A complete evaluation includes B-mode imaging, spectral Doppler, color Doppler, and power Doppler as needed of all accessible portions of each vessel. Bilateral testing is considered an integral part of a complete examination. Limited examinations for reoccurring indications may be performed as noted.  Abdominal Aorta Findings:  +-----------+-------+----------+----------+---------+--------+--------+ Location   AP (cm)Trans (cm)PSV (cm/s)Waveform ThrombusComments +-----------+-------+----------+----------+---------+--------+--------+ Proximal   2.02   2.17      90        triphasic                 +-----------+-------+----------+----------+---------+--------+--------+ Mid        1.62   1.86      59        triphasic                 +-----------+-------+----------+----------+---------+--------+--------+ Distal     170.00 1.60      68        triphasic                 +-----------+-------+----------+----------+---------+--------+--------+ RT CIA Prox1.0    1.0       133                                 +-----------+-------+----------+----------+---------+--------+--------+ LT CIA Prox0.9    1.0       124                                 +-----------+-------+----------+----------+---------+--------+--------+ IVC/Iliac Findings: +----------+------+--------+--------+    IVC    PatentThrombusComments +----------+------+--------+--------+ IVC Prox  patent                 +----------+------+--------+--------+ IVC Mid   patent                 +----------+------+--------+--------+ IVC Distalpatent                 +----------+------+--------+--------+    Summary: Abdominal Aorta: No evidence of an abdominal aortic aneurysm was visualized. No previous exam available for comparison. IVC/Iliac: There is no evidence of thrombus involving the IVC.  *See table(s) above for measurements and observations.  Electronically signed by Shirlee More MD on 03/29/2019 at 6:17:51 PM.  Final       No flowsheet data found.  Lab Results  Component Value Date   NITRICOXIDE 21 02/19/2016        Assessment & Plan:   COPD with asthma ([place]) COPD/asthma overlap syndrome.  Patient does very well without any significant exacerbations.  He does seem to be having increased symptom burden with  shortness of breath with activity.  Most likely due to deconditioning from not exercising over the last 6 months.  We will check a chest x-ray and CBC to rule out other etiology. Patient has mostly fixed chronic obstructive disease.  We will change Breo to Anoro a trial of this to see if this gives him any improvement in symptom burden.   Plan  Patient Instructions  Change BREO to ANORO 1 puff daily .  Advance Activity as tolerated  Chest xray today  Labs today .  Follow up in 3 months with Dr. Halford Chessman  or Parrett NP .  Please contact office for sooner follow up if symptoms do not improve or worsen or seek emergency care        Dyspnea Dyspnea with activity.  Suspect is related to his moderate COPD and suspected deconditioning.  Patient is advance activity as tolerated.  He has no chest tightness or chest pain with activity so low suspicion for cardiac issues. We will check chest x-ray and CBC      Tammy Parrett, NP 04/04/2019

## 2019-04-05 NOTE — Progress Notes (Signed)
Reviewed and agree with assessment/plan.   Arista Kettlewell, MD  Pulmonary/Critical Care 06/16/2016, 12:24 PM Pager:  [phone]  

## 2019-04-06 NOTE — Progress Notes (Signed)
Pt aware of results and recommendations Nothing further needed.

## 2019-05-03 ENCOUNTER — Other Ambulatory Visit: Payer: Self-pay | Admitting: Family Medicine

## 2019-05-03 DIAGNOSIS — E039 Hypothyroidism, unspecified: Secondary | ICD-10-CM

## 2019-05-03 DIAGNOSIS — N401 Enlarged prostate with lower urinary tract symptoms: Secondary | ICD-10-CM

## 2019-08-06 ENCOUNTER — Other Ambulatory Visit: Payer: Self-pay

## 2019-08-06 ENCOUNTER — Ambulatory Visit: Payer: Medicare Other | Attending: Internal Medicine

## 2019-08-06 DIAGNOSIS — Z23 Encounter for immunization: Secondary | ICD-10-CM | POA: Insufficient documentation

## 2019-08-06 NOTE — Progress Notes (Signed)
   Covid-19 Vaccination Clinic  Name:  Russell Brown    MRN: [id number] DOB: 1946-05-26  08/06/2019  Mr. Russell Brown was observed post Covid-19 immunization for 15 minutes without incidence. He was provided with Vaccine Information Sheet and instruction to access the V-Safe system.   Mr. Russell Brown was instructed to call 911 with any severe reactions post vaccine: [name] Kitchen Difficulty breathing  . Swelling of your face and throat  . A fast heartbeat  . A bad rash all over your body  . Dizziness and weakness    Immunizations Administered    Name Date Dose VIS Date Route   Moderna COVID-19 Vaccine 08/06/2019 10:06 AM 0.5 mL 05/22/2019 Intramuscular   Manufacturer: Moderna   Lot: [id number]   [id number]

## 2019-08-19 ENCOUNTER — Other Ambulatory Visit: Payer: Self-pay | Admitting: Family Medicine

## 2019-08-19 DIAGNOSIS — F4310 Post-traumatic stress disorder, unspecified: Secondary | ICD-10-CM

## 2019-08-19 DIAGNOSIS — E785 Hyperlipidemia, unspecified: Secondary | ICD-10-CM

## 2019-08-19 DIAGNOSIS — I10 Essential (primary) hypertension: Secondary | ICD-10-CM

## 2019-08-19 DIAGNOSIS — R079 Chest pain, unspecified: Secondary | ICD-10-CM

## 2019-08-23 ENCOUNTER — Other Ambulatory Visit: Payer: Self-pay

## 2019-08-23 MED ORDER — ANORO ELLIPTA 62.5-25 MCG/INH IN AEPB: 1.0000 | INHALATION_SPRAY | Freq: Every day | RESPIRATORY_TRACT | 5 refills | Status: DC

## 2019-08-28 MED ORDER — ANORO ELLIPTA 62.5-25 MCG/INH IN AEPB: 1.0000 | INHALATION_SPRAY | Freq: Every day | RESPIRATORY_TRACT | 1 refills | Status: DC

## 2019-09-04 ENCOUNTER — Ambulatory Visit: Payer: Medicare Other | Attending: Internal Medicine

## 2019-09-04 DIAGNOSIS — Z23 Encounter for immunization: Secondary | ICD-10-CM

## 2019-09-04 NOTE — Progress Notes (Signed)
   Covid-19 Vaccination Clinic  Name:  Russell Brown    MRN: [id number] DOB: 1945/06/29  09/04/2019  [name] was observed post Covid-19 immunization for 15 minutes without incident. He was provided with Vaccine Information Sheet and instruction to access the V-Safe system.   [name] was instructed to call 911 with any severe reactions post vaccine: [name] Kitchen Difficulty breathing  . Swelling of face and throat  . A fast heartbeat  . A bad rash all over body  . Dizziness and weakness   Immunizations Administered    Name Date Dose VIS Date Route   Moderna COVID-19 Vaccine 09/04/2019  9:45 AM 0.5 mL 05/22/2019 Intramuscular   Manufacturer: Moderna   Lot: [id number]   [id number]

## 2019-10-22 ENCOUNTER — Telehealth: Payer: Self-pay | Admitting: Family Medicine

## 2019-10-22 NOTE — Telephone Encounter (Signed)
Dr Etter Sjogren is out of the office but I will approve the transfer for her

## 2019-10-22 NOTE — Telephone Encounter (Signed)
Patient wife is calling and wanted to [facility] from Dr. Etter Sjogren to Dr. Ethelene Hal. Pls advise. CB is [phone]

## 2019-10-22 NOTE — Telephone Encounter (Signed)
That would be okay with me.

## 2019-10-22 NOTE — Telephone Encounter (Signed)
Contacted patient for scheduling for TOC with Dr. Ethelene Hal unable to lvm.

## 2019-11-10 IMAGING — DX DG CHEST 2V
2 series · 2 of 2 positions shown · non-contrast
Comparison: 02/20/2018

CLINICAL DATA: Shortness of breath for 7 days

EXAM:
CHEST - 2 VIEW

[chest pa]
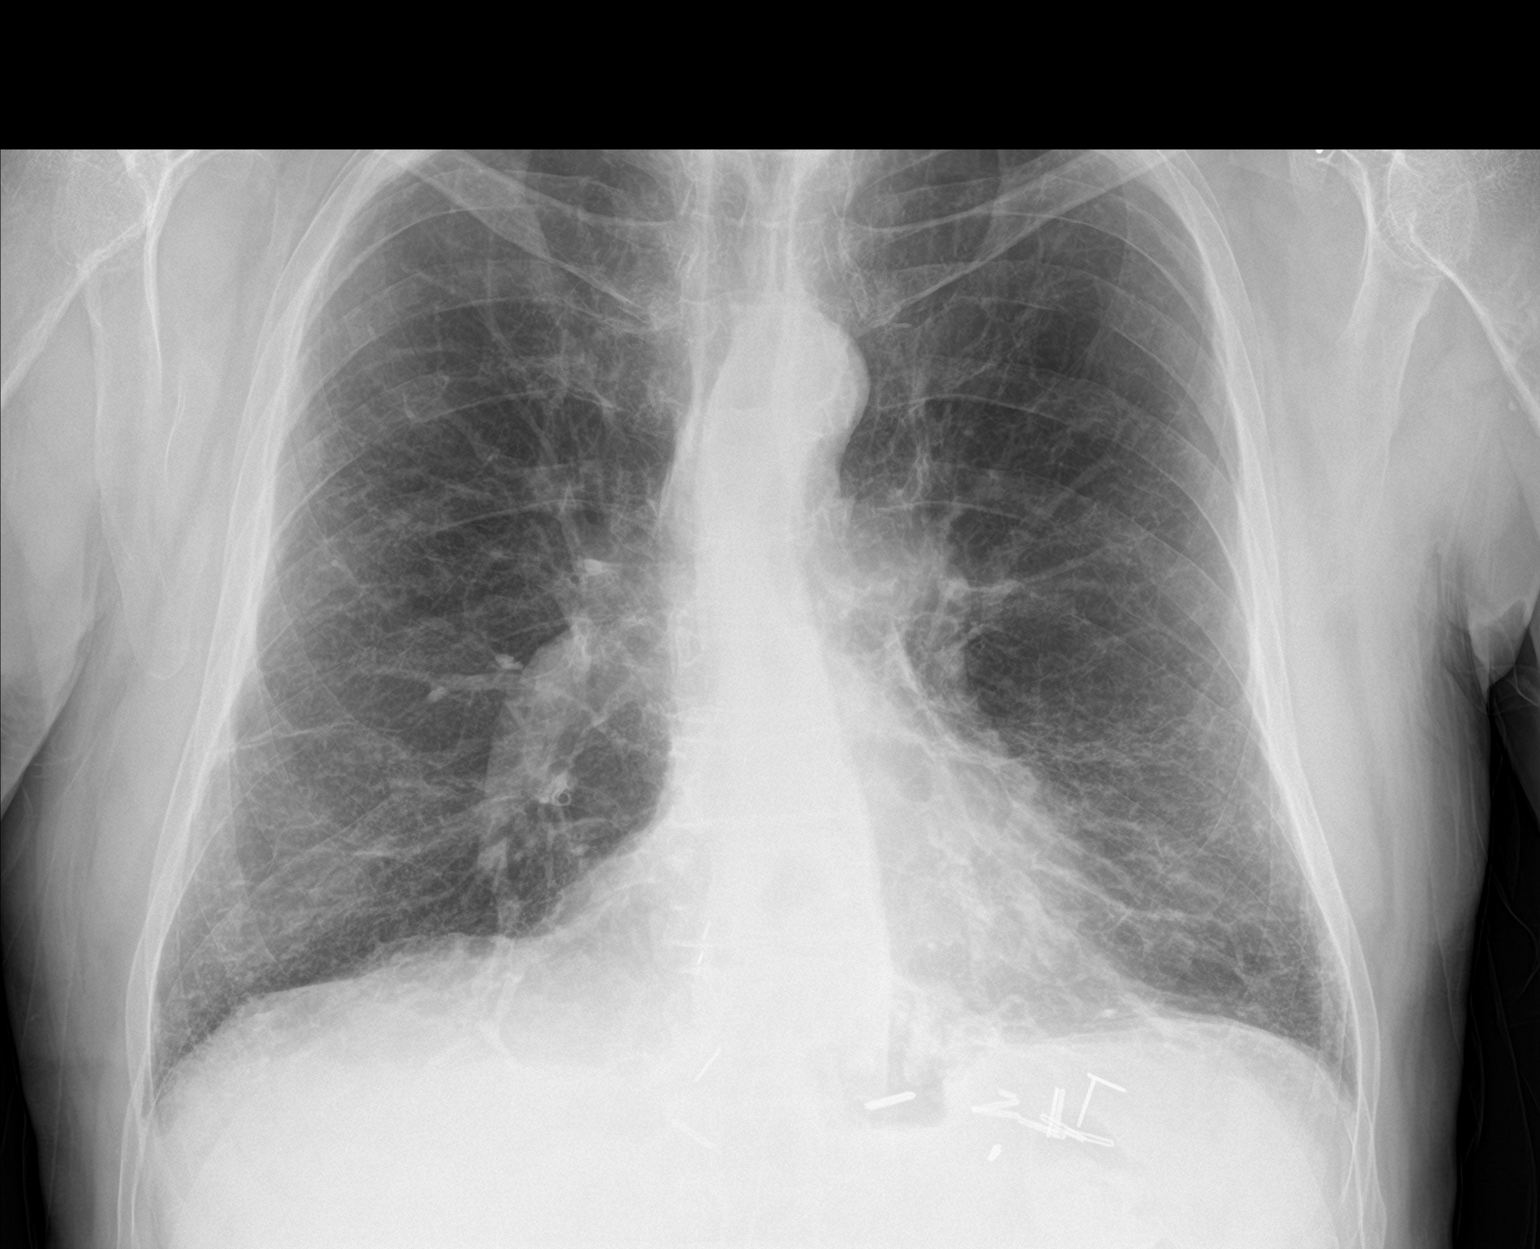

[chest lat]
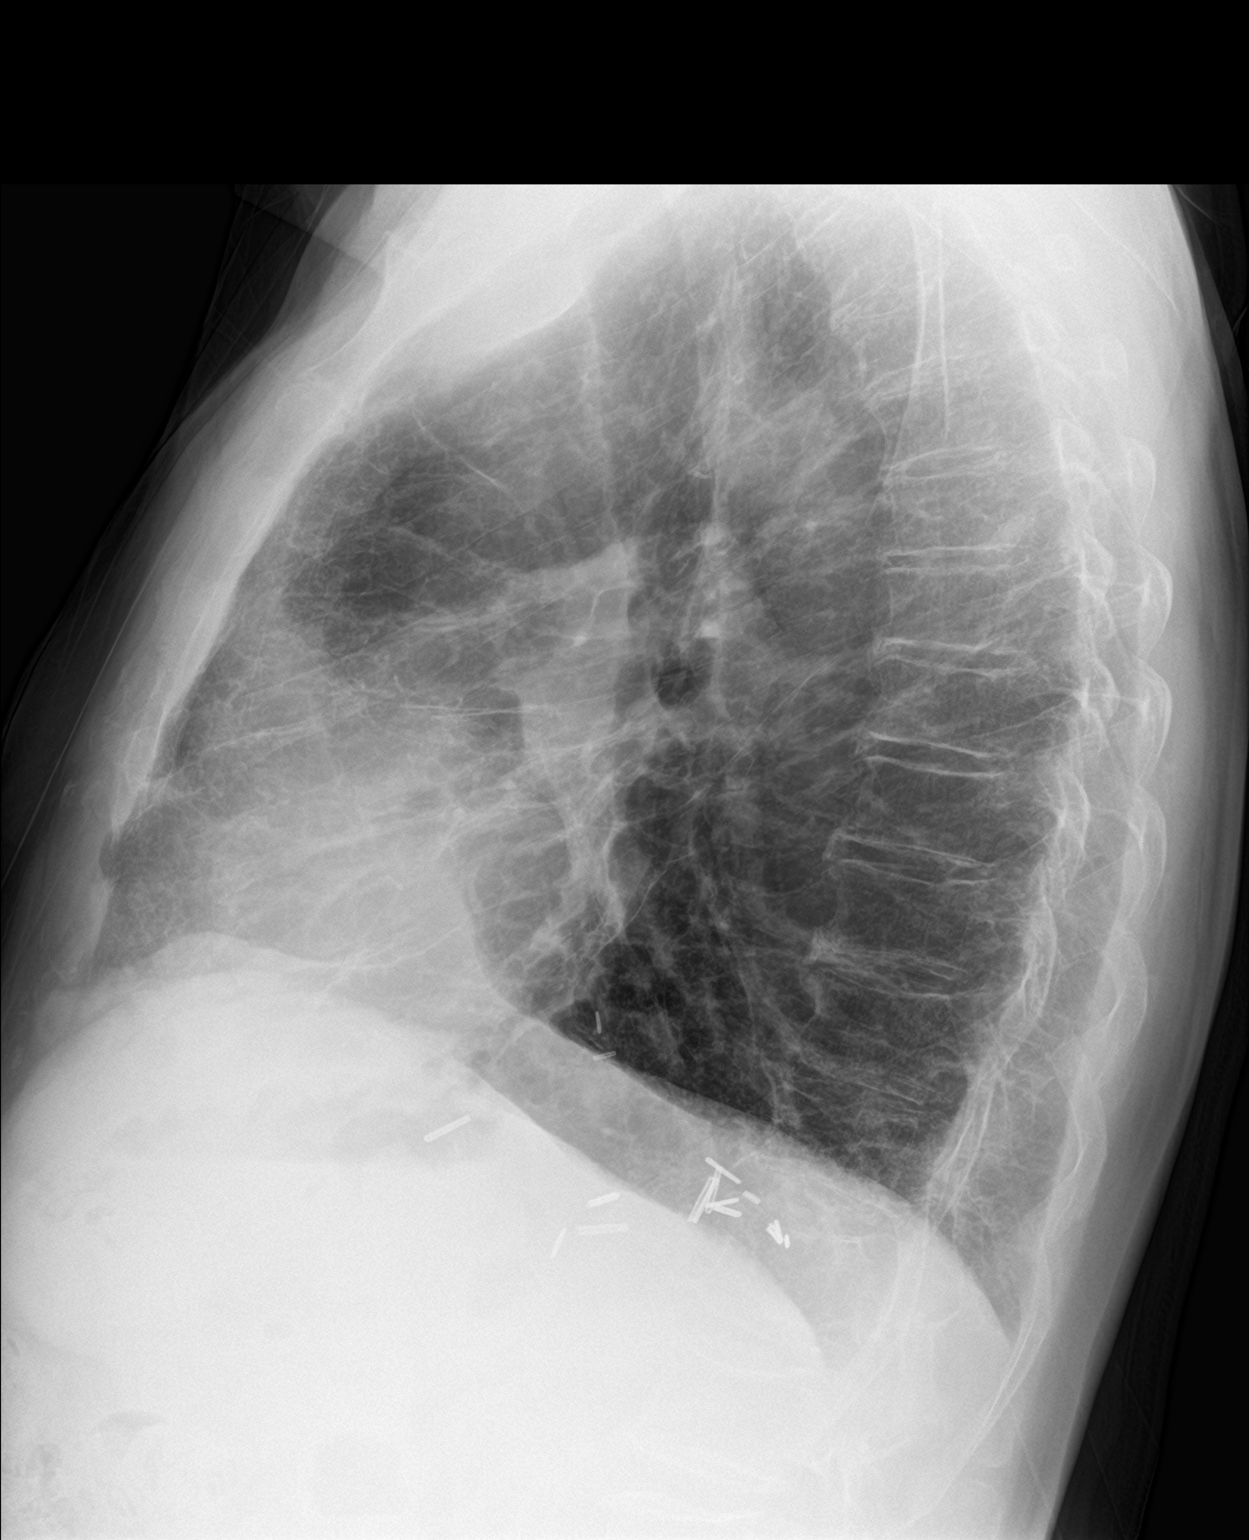

[2 of 2 positions shown; findings below may reference images not displayed]

FINDINGS: There is bilateral mild chronic interstitial thickening. The lungs
are hyperinflated likely secondary to COPD. There is no focal
parenchymal opacity. There is no pleural effusion or pneumothorax.
The heart and mediastinal contours are unremarkable.

The osseous structures are unremarkable.
IMPRESSION: No active cardiopulmonary disease.

COPD.

## 2020-01-01 ENCOUNTER — Other Ambulatory Visit: Payer: Self-pay | Admitting: Family Medicine

## 2020-01-01 DIAGNOSIS — F4310 Post-traumatic stress disorder, unspecified: Secondary | ICD-10-CM

## 2020-01-01 DIAGNOSIS — E785 Hyperlipidemia, unspecified: Secondary | ICD-10-CM

## 2020-01-01 DIAGNOSIS — I10 Essential (primary) hypertension: Secondary | ICD-10-CM

## 2020-01-01 DIAGNOSIS — R079 Chest pain, unspecified: Secondary | ICD-10-CM

## 2020-01-02 ENCOUNTER — Other Ambulatory Visit: Payer: Self-pay | Admitting: Adult Health

## 2020-01-18 ENCOUNTER — Other Ambulatory Visit: Payer: Self-pay | Admitting: Adult Health

## 2020-02-14 ENCOUNTER — Other Ambulatory Visit: Payer: Self-pay | Admitting: Family Medicine

## 2020-02-14 DIAGNOSIS — F4310 Post-traumatic stress disorder, unspecified: Secondary | ICD-10-CM

## 2020-03-16 ENCOUNTER — Other Ambulatory Visit: Payer: Self-pay | Admitting: Adult Health

## 2020-03-17 ENCOUNTER — Encounter: Payer: Self-pay | Admitting: Family Medicine

## 2020-03-17 ENCOUNTER — Other Ambulatory Visit: Payer: Self-pay

## 2020-03-17 ENCOUNTER — Ambulatory Visit (INDEPENDENT_AMBULATORY_CARE_PROVIDER_SITE_OTHER): Payer: Medicare Other | Admitting: Family Medicine

## 2020-03-17 VITALS — BP 134/76 | HR 66 | Temp 97.4°F | Ht 72.5 in | Wt 186.6 lb

## 2020-03-17 DIAGNOSIS — E785 Hyperlipidemia, unspecified: Secondary | ICD-10-CM | POA: Diagnosis not present

## 2020-03-17 DIAGNOSIS — M5416 Radiculopathy, lumbar region: Secondary | ICD-10-CM | POA: Diagnosis not present

## 2020-03-17 DIAGNOSIS — F419 Anxiety disorder, unspecified: Secondary | ICD-10-CM

## 2020-03-17 DIAGNOSIS — E041 Nontoxic single thyroid nodule: Secondary | ICD-10-CM

## 2020-03-17 DIAGNOSIS — I10 Essential (primary) hypertension: Secondary | ICD-10-CM

## 2020-03-17 DIAGNOSIS — E039 Hypothyroidism, unspecified: Secondary | ICD-10-CM

## 2020-03-17 DIAGNOSIS — F4310 Post-traumatic stress disorder, unspecified: Secondary | ICD-10-CM

## 2020-03-17 LAB — COMPREHENSIVE METABOLIC PANEL
ALT: 26 U/L (ref 0–53)
AST: 21 U/L (ref 0–37)
Albumin: 4.3 g/dL (ref 3.5–5.2)
Alkaline Phosphatase: 64 U/L (ref 39–117)
BUN: 15 mg/dL (ref 6–23)
CO2: 27 mEq/L (ref 19–32)
Calcium: 9 mg/dL (ref 8.4–10.5)
Chloride: 104 mEq/L (ref 96–112)
Creatinine, Ser: 1.11 mg/dL (ref 0.40–1.50)
GFR: 64.71 mL/min (ref >60.00)
Glucose, Bld: 113 mg/dL — ABNORMAL HIGH (ref 70–99)
Potassium: 3.5 mEq/L (ref 3.5–5.1)
Sodium: 141 mEq/L (ref 135–145)
Total Bilirubin: 0.8 mg/dL (ref 0.2–1.2)
Total Protein: 6.3 g/dL (ref 6.0–8.3)

## 2020-03-17 LAB — URINALYSIS, ROUTINE W REFLEX MICROSCOPIC
Bilirubin Urine: NEGATIVE
Hgb urine dipstick: NEGATIVE
Ketones, ur: NEGATIVE
Leukocytes,Ua: NEGATIVE
Nitrite: NEGATIVE
RBC / HPF: NONE SEEN (ref 0–?)
Specific Gravity, Urine: 1.02 (ref 1.000–1.030)
Total Protein, Urine: NEGATIVE
Urine Glucose: NEGATIVE
Urobilinogen, UA: 0.2 (ref 0.0–1.0)
pH: 6 (ref 5.0–8.0)

## 2020-03-17 LAB — CBC
HCT: 46.3 % (ref 39.0–52.0)
Hemoglobin: 15.3 g/dL (ref 13.0–17.0)
MCHC: 33.1 g/dL (ref 30.0–36.0)
MCV: 89 fl (ref 78.0–100.0)
Platelets: 196 10*3/uL (ref 150.0–400.0)
RBC: 5.2 Mil/uL (ref 4.22–5.81)
RDW: 14.3 % (ref 11.5–15.5)
WBC: 7.1 10*3/uL (ref 4.0–10.5)

## 2020-03-17 LAB — TSH: TSH: 1.67 u[IU]/mL (ref 0.35–4.50)

## 2020-03-17 LAB — LIPID PANEL
Cholesterol: 132 mg/dL (ref 0–200)
HDL: 40.2 mg/dL (ref >39.00)
LDL Cholesterol: 61 mg/dL (ref 0–99)
NonHDL: 92.19
Total CHOL/HDL Ratio: 3
Triglycerides: 157 mg/dL — ABNORMAL HIGH (ref 0.0–149.0)
VLDL: 31.4 mg/dL (ref 0.0–40.0)

## 2020-03-17 MED ORDER — SERTRALINE HCL 50 MG PO TABS: 50.0000 mg | ORAL_TABLET | Freq: Every day | ORAL | 2 refills | Status: DC

## 2020-03-17 NOTE — Patient Instructions (Signed)
Health Maintenance After Age 74 After age 74, you are at a higher risk for certain long-term diseases and infections as well as injuries from falls. Falls are a major cause of broken bones and head injuries in people who are older than age 74. Getting regular preventive care can help to keep you healthy and well. Preventive care includes getting regular testing and making lifestyle changes as recommended by your health care provider. Talk with your health care provider about:  Which screenings and tests you should have. A screening is a test that checks for a disease when you have no symptoms.  A diet and exercise plan that is right for you. What should I know about screenings and tests to prevent falls? Screening and testing are the best ways to find a health problem early. Early diagnosis and treatment give you the best chance of managing medical conditions that are common after age 74. Certain conditions and lifestyle choices may make you more likely to have a fall. Your health care provider may recommend:  Regular vision checks. Poor vision and conditions such as cataracts can make you more likely to have a fall. If you wear glasses, make sure to get your prescription updated if your vision changes.  Medicine review. Work with your health care provider to regularly review all of the medicines you are taking, including over-the-counter medicines. Ask your health care provider about any side effects that may make you more likely to have a fall. Tell your health care provider if any medicines that you take make you feel dizzy or sleepy.  Osteoporosis screening. Osteoporosis is a condition that causes the bones to get weaker. This can make the bones weak and cause them to break more easily.  Blood pressure screening. Blood pressure changes and medicines to control blood pressure can make you feel dizzy.  Strength and balance checks. Your health care provider may recommend certain tests to check your  strength and balance while standing, walking, or changing positions.  Foot health exam. Foot pain and numbness, as well as not wearing proper footwear, can make you more likely to have a fall.  Depression screening. You may be more likely to have a fall if you have a fear of falling, feel emotionally low, or feel unable to do activities that you used to do.  Alcohol use screening. Using too much alcohol can affect your balance and may make you more likely to have a fall. What actions can I take to lower my risk of falls? General instructions  Talk with your health care provider about your risks for falling. Tell your health care provider if: ? You fall. Be sure to tell your health care provider about all falls, even ones that seem minor. ? You feel dizzy, sleepy, or off-balance.  Take over-the-counter and prescription medicines only as told by your health care provider. These include any supplements.  Eat a healthy diet and maintain a healthy weight. A healthy diet includes low-fat dairy products, low-fat (lean) meats, and fiber from whole grains, beans, and lots of fruits and vegetables. Home safety  Remove any tripping hazards, such as rugs, cords, and clutter.  Install safety equipment such as grab bars in bathrooms and safety rails on stairs.  Keep rooms and walkways well-lit. Activity   Follow a regular exercise program to stay fit. This will help you maintain your balance. Ask your health care provider what types of exercise are appropriate for you.  If you need a cane or   walker, use it as recommended by your health care provider.  Wear supportive shoes that have nonskid soles. Lifestyle  Do not drink alcohol if your health care provider tells you not to drink.  If you drink alcohol, limit how much you have: ? 0-1 drink a day for women. ? 0-2 drinks a day for men.  Be aware of how much alcohol is in your drink. In the [place] one drink equals one typical bottle of beer (12  oz), one-half glass of wine (5 oz), or one shot of hard liquor (1 oz).  Do not use any products that contain nicotine or tobacco, such as cigarettes and e-cigarettes. If you need help quitting, ask your health care provider. Summary  Having a healthy lifestyle and getting preventive care can help to protect your health and wellness after age 74.  Screening and testing are the best way to find a health problem early and help you avoid having a fall. Early diagnosis and treatment give you the best chance for managing medical conditions that are more common for people who are older than age 74.  Falls are a major cause of broken bones and head injuries in people who are older than age 74. Take precautions to prevent a fall at home.  Work with your health care provider to learn what changes you can make to improve your health and wellness and to prevent falls. This information is not intended to replace advice given to you by your health care provider. Make sure you discuss any questions you have with your health care provider. Document Revised: 09/28/2018 Document Reviewed: 04/20/2017 Elsevier Patient Education  2020 [employer]  

## 2020-03-17 NOTE — Progress Notes (Signed)
Established Patient Office Visit  Subjective:  Patient ID: Russell Brown, male    DOB: 10/28/1945  Age: 74 y.o. MRN: [id number]  CC:  Chief Complaint  Patient presents with  . Establish Care    TOC-CPE/lab, fatsing this am.  numbness in the back of legs x months.    HPI Russell Brown presents for establishment of care by way of transfer.  He is doing well.  Retired married living with his wife.  He is quite active by hiking on a daily basis.  Quit smoking years ago but he does carry a history of COPD.  This is well compensated with current medication.  Blood pressure and cholesterol are well controlled he does have a history of coronary artery disease.  Currently seeing urology for follow-up of BPH symptoms.  PSA has been normal.  Scheduled for follow-up colonoscopy per GI.  Seeing the dentist regularly.  Tells of some numbness and tingling in the back of his left thigh and also to some extent in the right posterior thigh.  Thinks that might have been a gym related injury.  Denies saddle paresthesias or changes in his bowel or bladder function.  He is watching it now.  Anxiety associated with PTSD has been well controlled with low-dose sotalol.  He would like to continue it.  History of thyroid lobectomy associated with nodules.  Currently treated for hypothyroidism  Past Medical History:  Diagnosis Date  . 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 Asthma    pt has albuteral inhaler.  [name] Kitchen COPD (chronic obstructive pulmonary disease) ([place])   . GERD (gastroesophageal reflux disease)   . History of hiatal hernia   . Hyperlipemia   . Hypertension   . Hypothyroidism   . Pneumonia 2013; 2017  . PTSD (post-traumatic stress disorder)    retired Engineer, structural   . Sleep apnea    mild - but does not wear a c-pap per pt  . Thyroid disease    thyroid nodules - half of thyroid was removed per pt    Past Surgical History:  Procedure Laterality  Date  . APPENDECTOMY    . BACK SURGERY    . CARDIAC CATHETERIZATION  1980s   "no blockage at that time" (02/20/2018)  . CERVICAL SPINE SURGERY     "enlarged hole that nerves went thru"  . FINGER AMPUTATION Left 2004   2nd and 3rd digits; "table saw injury"  . FOOT FRACTURE SURGERY Left    "heel OR; put 2 steel rods in and took them out 6 wks later"  . FRACTURE SURGERY    . HERNIA REPAIR Right 1965  . LEFT HEART CATH AND CORONARY ANGIOGRAPHY N/A 02/21/2018   Procedure: LEFT HEART CATH AND CORONARY ANGIOGRAPHY;  Surgeon: Troy Sine, MD;  Location: [facility];  Service: Cardiovascular;  Laterality: N/A;  . NASAL SEPTUM SURGERY  1986  . NISSEN FUNDOPLICATION  1062I   "w/hernia repair"  . THYROIDECTOMY, PARTIAL  2000s  . WISDOM TOOTH EXTRACTION      Family History  Problem Relation Age of Onset  . Parkinsonism Mother   . Osteoporosis Mother   . Pneumonia Mother        84  . Cancer Father        prostate,  skin  . Hyperlipidemia Father   . Hypertension Father   . Prostate cancer Father   . Asthma Son   .  Osteoporosis Paternal Aunt   . Alzheimer's disease Paternal Uncle   . Osteoporosis Maternal Grandmother   . Cancer Maternal Grandmother        lung  . Heart disease Paternal Grandmother        MI  . Heart disease Paternal Grandfather        MI  . Colon cancer Neg Hx   . Pancreatic cancer Neg Hx   . Stomach cancer Neg Hx   . Esophageal cancer Neg Hx   . Liver cancer Neg Hx   . Rectal cancer Neg Hx     Social History   Socioeconomic History  . Marital status: Married    Spouse name: Not on file  . Number of children: 4  . Years of education: Not on file  . Highest education level: Not on file  Occupational History  . Occupation: retired Corporate treasurer: [employer]  Tobacco Use  . Smoking status: Former Smoker    Packs/day: 1.00    Years: 35.00    Pack years: 35.00    Types: Cigarettes    Quit date: 08/18/1996    Years since  quitting: 23.5  . Smokeless tobacco: Never Used  Vaping Use  . Vaping Use: Never used  Substance and Sexual Activity  . Alcohol use: Yes    Alcohol/week: 7.0 standard drinks    Types: 7 Cans of beer per week  . Drug use: Never  . Sexual activity: Not Currently    Partners: Female  Other Topics Concern  . Not on file  Social History Narrative   Exercise- weights and walk on treadmill   Social Determinants of Health   Financial Resource Strain:   . Difficulty of Paying Living Expenses: Not on file  Food Insecurity:   . Worried About Charity fundraiser in the Last Year: Not on file  . Ran Out of Food in the Last Year: Not on file  Transportation Needs:   . Lack of Transportation (Medical): Not on file  . Lack of Transportation (Non-Medical): Not on file  Physical Activity:   . Days of Exercise per Week: Not on file  . Minutes of Exercise per Session: Not on file  Stress:   . Feeling of Stress : Not on file  Social Connections:   . Frequency of Communication with Friends and Family: Not on file  . Frequency of Social Gatherings with Friends and Family: Not on file  . Attends Religious Services: Not on file  . Active Member of Clubs or Organizations: Not on file  . Attends Archivist Meetings: Not on file  . Marital Status: Not on file  Intimate Partner Violence:   . Fear of Current or Ex-Partner: Not on file  . Emotionally Abused: Not on file  . Physically Abused: Not on file  . Sexually Abused: Not on file    Outpatient Medications Prior to Visit  Medication Sig Dispense Refill  . albuterol (PROAIR HFA) 108 (90 Base) MCG/ACT inhaler Inhale 2 puffs into the lungs every 6 (six) hours as needed for wheezing or shortness of breath. 18 g 1  . ANORO ELLIPTA 62.5-25 MCG/INH AEPB USE 1 INHALATION BY MOUTH  DAILY 180 each 0  . aspirin EC 81 MG tablet Take 81 mg by mouth daily.    [name] Kitchen atorvastatin (LIPITOR) 20 MG tablet Take 1 tablet (20 mg total) by mouth daily. Pt  needs OV for further refills. 30 tablet 0  . dutasteride (  AVODART) 0.5 MG capsule TAKE 1 CAPSULE BY MOUTH  DAILY 90 capsule 3  . fish oil-omega-3 fatty acids 1000 MG capsule Take 2 g by mouth daily.     [name] Kitchen levothyroxine (SYNTHROID) 75 MCG tablet TAKE 1 TABLET BY MOUTH  DAILY BEFORE BREAKFAST 90 tablet 3  . lisinopril-hydrochlorothiazide (ZESTORETIC) 10-12.5 MG tablet Take 1 tablet by mouth daily. Pt needs OV for further refills. 30 tablet 0  . Multiple Vitamin (MULTIVITAMIN WITH MINERALS) TABS tablet Take 1 tablet by mouth daily.    [name] Kitchen omeprazole (PRILOSEC) 40 MG capsule TAKE 1 CAPSULE BY MOUTH TWO TIMES DAILY 180 capsule 3  . sertraline (ZOLOFT) 50 MG tablet TAKE 1 TABLET BY MOUTH  DAILY 30 tablet 11  . BREO ELLIPTA 200-25 MCG/INH AEPB USE 1 INHALATION BY MOUTH  DAILY 180 each 3  . ipratropium (ATROVENT) 0.03 % nasal spray Place 2 sprays into both nostrils 3 (three) times daily as needed for rhinitis. 90 mL 3  . nitroGLYCERIN (NITROSTAT) 0.4 MG SL tablet Place 1 tablet (0.4 mg total) under the tongue every 5 (five) minutes as needed. 25 tablet 3   No facility-administered medications prior to visit.    Allergies  Allergen Reactions  . Codeine Anxiety    Reaction if taken for extended periods of time.    ROS Review of Systems  Constitutional: Negative.   HENT: Negative.   Eyes: Negative for photophobia and visual disturbance.  Respiratory: Negative for choking, shortness of breath and wheezing.   Cardiovascular: Negative.  Negative for chest pain and palpitations.  Gastrointestinal: Negative.   Endocrine: Negative for polyphagia and polyuria.  Genitourinary: Negative for difficulty urinating, frequency and urgency.  Musculoskeletal: Positive for back pain. Negative for myalgias.  Skin: Negative.   Neurological: Positive for numbness. Negative for weakness.  Hematological: Does not bruise/bleed easily.  Psychiatric/Behavioral: Negative.       Objective:    Physical Exam Vitals  and nursing note reviewed.  Constitutional:      General: He is not in acute distress.    Appearance: Normal appearance. He is normal weight. He is not ill-appearing, toxic-appearing or diaphoretic.  HENT:     Head: Normocephalic and atraumatic.     Right Ear: Tympanic membrane, ear canal and external ear normal.     Left Ear: Tympanic membrane, ear canal and external ear normal.     Mouth/Throat:     Mouth: Mucous membranes are moist.     Pharynx: Oropharynx is clear. No oropharyngeal exudate or posterior oropharyngeal erythema.  Eyes:     General: No scleral icterus.       Right eye: No discharge.        Left eye: No discharge.     Extraocular Movements: Extraocular movements intact.     Conjunctiva/sclera: Conjunctivae normal.     Pupils: Pupils are equal, round, and reactive to light.  Cardiovascular:     Rate and Rhythm: Normal rate and regular rhythm.     Pulses:          Posterior tibial pulses are 1+ on the right side and 1+ on the left side.  Pulmonary:     Effort: Pulmonary effort is normal.     Breath sounds: Normal breath sounds.  Abdominal:     General: Abdomen is flat. Bowel sounds are normal. There is no distension.     Palpations: There is no mass.     Tenderness: There is no abdominal tenderness. There is no guarding or rebound.  Hernia: No hernia is present.  Musculoskeletal:     Cervical back: Normal range of motion. No rigidity or tenderness.     Lumbar back: No tenderness or bony tenderness. Normal range of motion. Negative right straight leg raise test and negative left straight leg raise test.     Right hip: Normal. Normal range of motion.     Left hip: Normal. Normal range of motion.     Right lower leg: No edema.     Left lower leg: No edema.  Lymphadenopathy:     Cervical: No cervical adenopathy.  Skin:    General: Skin is warm and dry.     Coloration: Skin is not jaundiced.  Neurological:     General: No focal deficit present.     Mental  Status: He is alert and oriented to person, place, and time.     Motor: Motor function is intact. No weakness.  Psychiatric:        Mood and Affect: Mood normal.        Behavior: Behavior normal.     BP 134/76   Pulse 66   Temp (!) 97.4 F (36.3 C) (Temporal)   Ht 6' 0.5" (1.842 m)   Wt 186 lb 9.6 oz (84.6 kg)   SpO2 97%   BMI 24.96 kg/m  Wt Readings from Last 3 Encounters:  03/17/20 186 lb 9.6 oz (84.6 kg)  04/04/19 189 lb 12.8 oz (86.1 kg)  03/21/19 190 lb (86.2 kg)     Health Maintenance Due  Topic Date Due  . TETANUS/TDAP  05/23/2019    There are no preventive care reminders to display for this patient.  Lab Results  Component Value Date   TSH 1.90 03/16/2019   Lab Results  Component Value Date   WBC 7.7 04/04/2019   HGB 15.1 04/04/2019   HCT 45.8 04/04/2019   MCV 89.8 04/04/2019   PLT 225.0 04/04/2019   Lab Results  Component Value Date   NA 142 03/16/2019   K 4.8 03/16/2019   CO2 29 03/16/2019   GLUCOSE 100 (H) 03/16/2019   BUN 12 03/16/2019   CREATININE 0.96 03/16/2019   BILITOT 0.7 03/16/2019   ALKPHOS 74 03/16/2019   AST 20 03/16/2019   ALT 28 03/16/2019   PROT 6.3 03/16/2019   ALBUMIN 4.3 03/16/2019   CALCIUM 9.5 03/16/2019   ANIONGAP 10 04/26/2018   GFR 76.72 03/16/2019   Lab Results  Component Value Date   CHOL 141 03/16/2019   Lab Results  Component Value Date   HDL 41.50 03/16/2019   Lab Results  Component Value Date   LDLCALC 61 03/16/2019   Lab Results  Component Value Date   TRIG 194.0 (H) 03/16/2019   Lab Results  Component Value Date   CHOLHDL 3 03/16/2019   Lab Results  Component Value Date   HGBA1C 5.6 02/20/2018      Assessment & Plan:   Problem List Items Addressed This Visit      Cardiovascular and Mediastinum   Essential hypertension   Relevant Orders   CBC   Comprehensive metabolic panel     Endocrine   Hypothyroidism     Nervous and Auditory   Lumbar radiculopathy   Relevant Medications    sertraline (ZOLOFT) 50 MG tablet     Other   Hyperlipidemia LDL goal <100 - Primary   Anxiety   Relevant Medications   sertraline (ZOLOFT) 50 MG tablet    Other Visit Diagnoses  Thyroid nodule       Relevant Orders   Urinalysis, Routine w reflex microscopic   TSH   US THYROID   PTSD (post-traumatic stress disorder)       Relevant Medications   sertraline (ZOLOFT) 50 MG tablet      Meds ordered this encounter  Medications  . sertraline (ZOLOFT) 50 MG tablet    Sig: Take 1 tablet (50 mg total) by mouth daily.    Dispense:  90 tablet    Refill:  2    Requesting 1 year supply    Follow-up: Return in about 6 months (around 09/14/2020).  He will follow his low back pain with radiculopathy for the time being we discussed possible sports medicine orthopedic referral.  He may consider chiropractic.  We discussed worrisome signs and symptoms.  Libby Maw, MD

## 2020-03-26 ENCOUNTER — Other Ambulatory Visit: Payer: Self-pay | Admitting: Family Medicine

## 2020-03-26 DIAGNOSIS — N401 Enlarged prostate with lower urinary tract symptoms: Secondary | ICD-10-CM

## 2020-04-01 ENCOUNTER — Other Ambulatory Visit: Payer: Self-pay

## 2020-04-01 ENCOUNTER — Other Ambulatory Visit: Payer: Self-pay | Admitting: Adult Health

## 2020-04-01 ENCOUNTER — Ambulatory Visit (HOSPITAL_BASED_OUTPATIENT_CLINIC_OR_DEPARTMENT_OTHER)
Admission: RE | Admit: 2020-04-01 | Discharge: 2020-04-01 | Disposition: A | Discharge status: Home / Self Care | Payer: Medicare Other | Source: Ambulatory Visit | Attending: Family Medicine | Admitting: Family Medicine

## 2020-04-01 DIAGNOSIS — E041 Nontoxic single thyroid nodule: Secondary | ICD-10-CM | POA: Diagnosis present

## 2020-04-02 NOTE — Telephone Encounter (Signed)
Called and left message on voicemail to please return call to set up an office visit with provider. Contact number provided.

## 2020-04-10 ENCOUNTER — Other Ambulatory Visit: Payer: Self-pay | Admitting: Internal Medicine

## 2020-04-11 ENCOUNTER — Other Ambulatory Visit: Payer: Self-pay

## 2020-04-11 DIAGNOSIS — I10 Essential (primary) hypertension: Secondary | ICD-10-CM

## 2020-04-11 DIAGNOSIS — E785 Hyperlipidemia, unspecified: Secondary | ICD-10-CM

## 2020-04-11 DIAGNOSIS — R079 Chest pain, unspecified: Secondary | ICD-10-CM

## 2020-04-11 MED ORDER — LISINOPRIL-HYDROCHLOROTHIAZIDE 10-12.5 MG PO TABS: 1.0000 | ORAL_TABLET | Freq: Every day | ORAL | 1 refills | Status: DC

## 2020-04-11 MED ORDER — ATORVASTATIN CALCIUM 20 MG PO TABS: 20.0000 mg | ORAL_TABLET | Freq: Every day | ORAL | 1 refills | Status: DC

## 2020-04-24 ENCOUNTER — Ambulatory Visit (INDEPENDENT_AMBULATORY_CARE_PROVIDER_SITE_OTHER): Payer: Medicare Other

## 2020-04-24 VITALS — Ht 72.5 in | Wt 186.0 lb

## 2020-04-24 DIAGNOSIS — Z0000 Encounter for general adult medical examination without abnormal findings: Secondary | ICD-10-CM | POA: Diagnosis not present

## 2020-04-24 NOTE — Patient Instructions (Signed)
Russell Brown , Thank you for taking time to complete your Medicare Wellness Visit. I appreciate your ongoing commitment to your health goals. Please review the following plan we discussed and let me know if I can assist you in the future.   Screening recommendations/referrals: Colonoscopy: Completed 05/10/2013- No longer required after age 74. Recommended yearly ophthalmology/optometry visit for glaucoma screening and checkup Recommended yearly dental visit for hygiene and checkup  Vaccinations: Influenza vaccine: Up to date Pneumococcal vaccine: Completed vaccines Tdap vaccine: Up to date- Due 02/15/2021 Shingles vaccine: Completed vaccines   Covid-19: Completed vaccines  Advanced directives: Please bring a copy for your chart  Conditions/risks identified: See problem list  Next appointment: Follow up in one year for your annual wellness visit. 04/28/2021 @ 2:15  Preventive Care 65 Years and Older, Male Preventive care refers to lifestyle choices and visits with your health care provider that can promote health and wellness. What does preventive care include?  A yearly physical exam. This is also called an annual well check.  Dental exams once or twice a year.  Routine eye exams. Ask your health care provider how often you should have your eyes checked.  Personal lifestyle choices, including:  Daily care of your teeth and gums.  Regular physical activity.  Eating a healthy diet.  Avoiding tobacco and drug use.  Limiting alcohol use.  Practicing safe sex.  Taking low doses of aspirin every day.  Taking vitamin and mineral supplements as recommended by your health care provider. What happens during an annual well check? The services and screenings done by your health care provider during your annual well check will depend on your age, overall health, lifestyle risk factors, and family history of disease. Counseling  Your health care provider may ask you questions about  your:  Alcohol use.  Tobacco use.  Drug use.  Emotional well-being.  Home and relationship well-being.  Sexual activity.  Eating habits.  History of falls.  Memory and ability to understand (cognition).  Work and work Statistician. Screening  You may have the following tests or measurements:  Height, weight, and BMI.  Blood pressure.  Lipid and cholesterol levels. These may be checked every 5 years, or more frequently if you are over 54 years old.  Skin check.  Lung cancer screening. You may have this screening every year starting at age 49 if you have a 30-pack-year history of smoking and currently smoke or have quit within the past 15 years.  Fecal occult blood test (FOBT) of the stool. You may have this test every year starting at age 26.  Flexible sigmoidoscopy or colonoscopy. You may have a sigmoidoscopy every 5 years or a colonoscopy every 10 years starting at age 41.  Prostate cancer screening. Recommendations will vary depending on your family history and other risks.  Hepatitis C blood test.  Hepatitis B blood test.  Sexually transmitted disease (STD) testing.  Diabetes screening. This is done by checking your blood sugar (glucose) after you have not eaten for a while (fasting). You may have this done every 1-3 years.  Abdominal aortic aneurysm (AAA) screening. You may need this if you are a current or former smoker.  Osteoporosis. You may be screened starting at age 60 if you are at high risk. Talk with your health care provider about your test results, treatment options, and if necessary, the need for more tests. Vaccines  Your health care provider may recommend certain vaccines, such as:  Influenza vaccine. This is recommended every  year.  Tetanus, diphtheria, and acellular pertussis (Tdap, Td) vaccine. You may need a Td booster every 10 years.  Zoster vaccine. You may need this after age 5.  Pneumococcal 13-valent conjugate (PCV13) vaccine.  One dose is recommended after age 79.  Pneumococcal polysaccharide (PPSV23) vaccine. One dose is recommended after age 78. Talk to your health care provider about which screenings and vaccines you need and how often you need them. This information is not intended to replace advice given to you by your health care provider. Make sure you discuss any questions you have with your health care provider. Document Released: 07/04/2015 Document Revised: 02/25/2016 Document Reviewed: 04/08/2015 Elsevier Interactive Patient Education  2017 [place] Prevention in the Home Falls can cause injuries. They can happen to people of all ages. There are many things you can do to make your home safe and to help prevent falls. What can I do on the outside of my home?  Regularly fix the edges of walkways and driveways and fix any cracks.  Remove anything that might make you trip as you walk through a door, such as a raised step or threshold.  Trim any bushes or trees on the path to your home.  Use bright outdoor lighting.  Clear any walking paths of anything that might make someone trip, such as rocks or tools.  Regularly check to see if handrails are loose or broken. Make sure that both sides of any steps have handrails.  Any raised decks and porches should have guardrails on the edges.  Have any leaves, snow, or ice cleared regularly.  Use sand or salt on walking paths during winter.  Clean up any spills in your garage right away. This includes oil or grease spills. What can I do in the bathroom?  Use night lights.  Install grab bars by the toilet and in the tub and shower. Do not use towel bars as grab bars.  Use non-skid mats or decals in the tub or shower.  If you need to sit down in the shower, use a plastic, non-slip stool.  Keep the floor dry. Clean up any water that spills on the floor as soon as it happens.  Remove soap buildup in the tub or shower regularly.  Attach bath  mats securely with double-sided non-slip rug tape.  Do not have throw rugs and other things on the floor that can make you trip. What can I do in the bedroom?  Use night lights.  Make sure that you have a light by your bed that is easy to reach.  Do not use any sheets or blankets that are too big for your bed. They should not hang down onto the floor.  Have a firm chair that has side arms. You can use this for support while you get dressed.  Do not have throw rugs and other things on the floor that can make you trip. What can I do in the kitchen?  Clean up any spills right away.  Avoid walking on wet floors.  Keep items that you use a lot in easy-to-reach places.  If you need to reach something above you, use a strong step stool that has a grab bar.  Keep electrical cords out of the way.  Do not use floor polish or wax that makes floors slippery. If you must use wax, use non-skid floor wax.  Do not have throw rugs and other things on the floor that can make you trip. What can  I do with my stairs?  Do not leave any items on the stairs.  Make sure that there are handrails on both sides of the stairs and use them. Fix handrails that are broken or loose. Make sure that handrails are as long as the stairways.  Check any carpeting to make sure that it is firmly attached to the stairs. Fix any carpet that is loose or worn.  Avoid having throw rugs at the top or bottom of the stairs. If you do have throw rugs, attach them to the floor with carpet tape.  Make sure that you have a light switch at the top of the stairs and the bottom of the stairs. If you do not have them, ask someone to add them for you. What else can I do to help prevent falls?  Wear shoes that:  Do not have high heels.  Have rubber bottoms.  Are comfortable and fit you well.  Are closed at the toe. Do not wear sandals.  If you use a stepladder:  Make sure that it is fully opened. Do not climb a closed  stepladder.  Make sure that both sides of the stepladder are locked into place.  Ask someone to hold it for you, if possible.  Clearly mark and make sure that you can see:  Any grab bars or handrails.  First and last steps.  Where the edge of each step is.  Use tools that help you move around (mobility aids) if they are needed. These include:  Canes.  Walkers.  Scooters.  Crutches.  Turn on the lights when you go into a dark area. Replace any light bulbs as soon as they burn out.  Set up your furniture so you have a clear path. Avoid moving your furniture around.  If any of your floors are uneven, fix them.  If there are any pets around you, be aware of where they are.  Review your medicines with your doctor. Some medicines can make you feel dizzy. This can increase your chance of falling. Ask your doctor what other things that you can do to help prevent falls. This information is not intended to replace advice given to you by your health care provider. Make sure you discuss any questions you have with your health care provider. Document Released: 04/03/2009 Document Revised: 11/13/2015 Document Reviewed: 07/12/2014 Elsevier Interactive Patient Education  2017 [employer].

## 2020-04-24 NOTE — Progress Notes (Signed)
Subjective:   Russell Brown is a 74 y.o. male who presents for Medicare Annual/Subsequent preventive examination.   .I connected with Florian today by telephone and verified that I am speaking with the correct person using two identifiers. Location patient: home Location provider: work Persons participating in the virtual visit: patient, Marine scientist.    I discussed the limitations, risks, security and privacy concerns of performing an evaluation and management service by telephone and the availability of in person appointments. I also discussed with the patient that there may be a patient responsible charge related to this service. The patient expressed understanding and verbally consented to this telephonic visit.    Interactive audio and video telecommunications were attempted between this provider and patient, however failed, due to patient having technical difficulties OR patient did not have access to video capability.  We continued and completed visit with audio only.  Some vital signs may be absent or patient reported.   Time Spent with patient on telephone encounter: 20 minutes  Review of Systems     Cardiac Risk Factors include: advanced age (>1men, >44 women);male gender;sedentary lifestyle;hypertension;dyslipidemia     Objective:    Today's Vitals   04/24/20 1333  Weight: 186 lb (84.4 kg)  Height: 6' 0.5" (1.842 m)  PainSc: 3    Body mass index is 24.88 kg/m.  Advanced Directives 04/24/2020 04/26/2018 03/25/2018 02/20/2018 02/20/2018 01/02/2016  Does Patient Have a Medical Advance Directive? Yes No Yes Yes Yes Yes  Type of Paramedic of [place];Living will - [place];Living will [place];Living will - [place];Living will  Does patient want to make changes to medical advance directive? - - - No - Patient declined No - Patient declined Yes - information given  Copy of [place] in Chart? No - copy requested - No - copy requested No - copy requested - No - copy requested    Current Medications (verified) Outpatient Encounter Medications as of 04/24/2020  Medication Sig  . albuterol (PROAIR HFA) 108 (90 Base) MCG/ACT inhaler Inhale 2 puffs into the lungs every 6 (six) hours as needed for wheezing or shortness of breath.  Jearl Klinefelter ELLIPTA 62.5-25 MCG/INH AEPB USE 1 INHALATION BY MOUTH  ONCE DAILY AT THE SAME TIME EACH DAY  . aspirin EC 81 MG tablet Take 81 mg by mouth daily.  [name] Kitchen atorvastatin (LIPITOR) 20 MG tablet Take 1 tablet (20 mg total) by mouth daily. Pt needs OV for further refills.  . dutasteride (AVODART) 0.5 MG capsule TAKE 1 CAPSULE BY MOUTH  DAILY  . fish oil-omega-3 fatty acids 1000 MG capsule Take 2 g by mouth daily.   [name] Kitchen levothyroxine (SYNTHROID) 75 MCG tablet TAKE 1 TABLET BY MOUTH  DAILY BEFORE BREAKFAST  . lisinopril-hydrochlorothiazide (ZESTORETIC) 10-12.5 MG tablet Take 1 tablet by mouth daily. Pt needs OV for further refills.  . Multiple Vitamin (MULTIVITAMIN WITH MINERALS) TABS tablet Take 1 tablet by mouth daily.  [name] Kitchen omeprazole (PRILOSEC) 40 MG capsule Take 1 capsule (40 mg total) by mouth 2 (two) times daily. Office visit for further refills  . sertraline (ZOLOFT) 50 MG tablet Take 1 tablet (50 mg total) by mouth daily.  . nitroGLYCERIN (NITROSTAT) 0.4 MG SL tablet Place 1 tablet (0.4 mg total) under the tongue every 5 (five) minutes as needed.   No facility-administered encounter medications on file as of 04/24/2020.    Allergies (verified) Codeine   History: Past Medical History:  Diagnosis Date  . 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 Asthma    pt has albuteral inhaler.  [name] Kitchen COPD (chronic obstructive pulmonary disease) ([place])   . GERD (gastroesophageal reflux disease)   . History of hiatal hernia   . Hyperlipemia   . Hypertension   . Hypothyroidism   . Pneumonia 2013; 2017  . PTSD  (post-traumatic stress disorder)    retired Engineer, structural   . Sleep apnea    mild - but does not wear a c-pap per pt  . Thyroid disease    thyroid nodules - half of thyroid was removed per pt   Past Surgical History:  Procedure Laterality Date  . APPENDECTOMY    . BACK SURGERY    . CARDIAC CATHETERIZATION  1980s   "no blockage at that time" (02/20/2018)  . CERVICAL SPINE SURGERY     "enlarged hole that nerves went thru"  . FINGER AMPUTATION Left 2004   2nd and 3rd digits; "table saw injury"  . FOOT FRACTURE SURGERY Left    "heel OR; put 2 steel rods in and took them out 6 wks later"  . FRACTURE SURGERY    . HERNIA REPAIR Right 1965  . LEFT HEART CATH AND CORONARY ANGIOGRAPHY N/A 02/21/2018   Procedure: LEFT HEART CATH AND CORONARY ANGIOGRAPHY;  Surgeon: Troy Sine, MD;  Location: [facility];  Service: Cardiovascular;  Laterality: N/A;  . NASAL SEPTUM SURGERY  1986  . NISSEN FUNDOPLICATION  5284X   "w/hernia repair"  . THYROIDECTOMY, PARTIAL  2000s  . WISDOM TOOTH EXTRACTION     Family History  Problem Relation Age of Onset  . Parkinsonism Mother   . Osteoporosis Mother   . Pneumonia Mother        36  . Cancer Father        prostate,  skin  . Hyperlipidemia Father   . Hypertension Father   . Prostate cancer Father   . Asthma Son   . Osteoporosis Paternal Aunt   . Alzheimer's disease Paternal Uncle   . Osteoporosis Maternal Grandmother   . Cancer Maternal Grandmother        lung  . Heart disease Paternal Grandmother        MI  . Heart disease Paternal Grandfather        MI  . Colon cancer Neg Hx   . Pancreatic cancer Neg Hx   . Stomach cancer Neg Hx   . Esophageal cancer Neg Hx   . Liver cancer Neg Hx   . Rectal cancer Neg Hx    Social History   Socioeconomic History  . Marital status: Married    Spouse name: Not on file  . Number of children: 4  . Years of education: Not on file  . Highest education level: Not on file  Occupational History  .  Occupation: retired Corporate treasurer: [employer]  Tobacco Use  . Smoking status: Former Smoker    Packs/day: 1.00    Years: 35.00    Pack years: 35.00    Types: Cigarettes    Quit date: 08/18/1996    Years since quitting: 23.6  . Smokeless tobacco: Never Used  Vaping Use  . Vaping Use: Never used  Substance and Sexual Activity  . Alcohol use: Yes    Alcohol/week: 7.0 standard drinks    Types: 7 Cans of beer per week  . Drug use:  Never  . Sexual activity: Not Currently    Partners: Female  Other Topics Concern  . Not on file  Social History Narrative   Exercise- weights and walk on treadmill   Social Determinants of Health   Financial Resource Strain: Low Risk   . Difficulty of Paying Living Expenses: Not hard at all  Food Insecurity: No Food Insecurity  . Worried About Charity fundraiser in the Last Year: Never true  . Ran Out of Food in the Last Year: Never true  Transportation Needs: No Transportation Needs  . Lack of Transportation (Medical): No  . Lack of Transportation (Non-Medical): No  Physical Activity: Inactive  . Days of Exercise per Week: 0 days  . Minutes of Exercise per Session: 0 min  Stress: No Stress Concern Present  . Feeling of Stress : Not at all  Social Connections: Socially Integrated  . Frequency of Communication with Friends and Family: More than three times a week  . Frequency of Social Gatherings with Friends and Family: Once a week  . Attends Religious Services: More than 4 times per year  . Active Member of Clubs or Organizations: Yes  . Attends Archivist Meetings: More than 4 times per year  . Marital Status: Married    Tobacco Counseling Counseling given: Not Answered   Clinical Intake:  Pre-visit preparation completed: Yes  Pain : 0-10 Pain Score: 3  Pain Type: Chronic pain Pain Location: Back Pain Onset: More than a month ago Pain Frequency: Constant     Nutritional Status: BMI of 19-24   Normal Nutritional Risks: None Diabetes: No  How often do you need to have someone help you when you read instructions, pamphlets, or other written materials from your doctor or pharmacy?: 1 - Never What is the last grade level you completed in school?: 3 yrs of college  Diabetic?No  Interpreter Needed?: No  Information entered by :: Caroleen Hamman LPN   Activities of Daily Living In your present state of health, do you have any difficulty performing the following activities: 04/24/2020  Hearing? N  Vision? N  Difficulty concentrating or making decisions? N  Walking or climbing stairs? N  Dressing or bathing? N  Doing errands, shopping? N  Preparing Food and eating ? N  Using the Toilet? N  In the past six months, have you accidently leaked urine? N  Do you have problems with loss of bowel control? N  Managing your Medications? N  Managing your Finances? N  Housekeeping or managing your Housekeeping? N  Some recent data might be hidden    Patient Care Team: Libby Maw, MD as PCP - General (Family Medicine) Festus Aloe, MD as Consulting Physician (Urology) Juanito Doom, MD as Consulting Physician (Pulmonary Disease) Nahser, Wonda Cheng, MD as Consulting Physician (Cardiology)  Indicate any recent Medical Services you may have received from other than [facility] providers in the past year (date may be approximate).     Assessment:   This is a routine wellness examination for Ender.  Hearing/Vision screen  Hearing Screening   125Hz  250Hz  500Hz  1000Hz  2000Hz  3000Hz  4000Hz  6000Hz  8000Hz   Right ear:           Left ear:           Comments: No issues  Vision Screening Comments: Wears contacts Last eye exam-01/29/20-Dr. Sharen Counter  Dietary issues and exercise activities discussed: Current Exercise Habits: The patient does not participate in regular exercise at present, Exercise limited by:  None identified  Goals    . Patient Stated     Increase activity       Depression Screen PHQ 2/9 Scores 04/24/2020 03/17/2020 01/12/2018 12/13/2017 07/02/2016 01/02/2016 12/05/2014  PHQ - 2 Score 0 0 0 0 0 0 0  PHQ- 9 Score - - 2 - - - -    Fall Risk Fall Risk  04/24/2020 03/17/2020 12/13/2017 07/02/2016 01/02/2016  Falls in the past year? 0 0 No No No  Number falls in past yr: 0 0 - - -  Injury with Fall? 0 0 - - -  Follow up Falls prevention discussed - - - -    Any stairs in or around the home? Yes  If so, are there any without handrails? No  Home free of loose throw rugs in walkways, pet beds, electrical cords, etc? Yes  Adequate lighting in your home to reduce risk of falls? Yes   ASSISTIVE DEVICES UTILIZED TO PREVENT FALLS:  Life alert? No  Use of a cane, walker or w/c? No  Grab bars in the bathroom? Yes  Shower chair or bench in shower? No  Elevated toilet seat or a handicapped toilet? No   TIMED UP AND GO:  Was the test performed? No . Phone visit   Cognitive Function:No cognitive impairment noted        Immunizations Immunization History  Administered Date(s) Administered  . Fluad Quad(high Dose 65+) 03/16/2019  . H1N1 05/28/2008  . Influenza Whole 04/28/2007, 03/26/2008, 04/16/2009, 05/07/2010, 04/10/2011, 04/13/2012  . Influenza, High Dose Seasonal PF 03/05/2014, 03/17/2015, 03/17/2018, 03/26/2019  . Influenza, Seasonal, Injecte, Preservative Fre 03/06/2013  . Influenza,inj,Quad PF,6+ Mos 03/04/2016  . Influenza-Unspecified 03/22/2011, 03/11/2020  . Moderna SARS-COVID-2 Vaccination 08/06/2019, 09/04/2019  . Pneumococcal Conjugate-13 12/05/2014  . Pneumococcal Polysaccharide-23 04/11/2008, 04/18/2013, 03/16/2019  . Td 05/22/2009, 02/16/2011  . Zoster 05/22/2009  . Zoster Recombinat (Shingrix) 03/21/2017, 09/10/2017    TDAP status: Due, Education has been provided regarding the importance of this vaccine. Advised may receive this vaccine at local pharmacy or Health Dept. Aware to provide a copy of the vaccination record if obtained  from local pharmacy or Health Dept. Verbalized acceptance and understanding.   Flu Vaccine status: Up to date   Pneumococcal vaccine status: Up to date   Covid-19 vaccine status: Completed vaccines  Qualifies for Shingles Vaccine? No   Zostavax completed Yes   Shingrix Completed?: Yes  Screening Tests Health Maintenance  Topic Date Due  . TETANUS/TDAP  02/15/2021  . COLONOSCOPY  05/11/2023  . INFLUENZA VACCINE  Completed  . COVID-19 Vaccine  Completed  . Hepatitis C Screening  Completed  . PNA vac Low Risk Adult  Completed    Health Maintenance  There are no preventive care reminders to display for this patient.  Colorectal cancer screening: Completed Colonoscopy 05/10/2013. Repeat every 10 years  Lung Cancer Screening: (Low Dose CT Chest recommended if Age 65-80 years, 30 pack-year currently smoking OR have quit w/in 15years.) does not qualify.     Additional Screening:  Hepatitis C Screening: Completed 12/06/2016  Vision Screening: Recommended annual ophthalmology exams for early detection of glaucoma and other disorders of the eye. Is the patient up to date with their annual eye exam?  Yes  Who is the provider or what is the name of the office in which the patient attends annual eye exams? Dr. Sharen Counter   Dental Screening: Recommended annual dental exams for proper oral hygiene  Community Resource Referral / Chronic Care Management: CRR  required this visit?  No   CCM required this visit?  No      Plan:     I have personally reviewed and noted the following in the patient's chart:   . Medical and social history . Use of alcohol, tobacco or illicit drugs  . Current medications and supplements . Functional ability and status . Nutritional status . Physical activity . Advanced directives . List of other physicians . Hospitalizations, surgeries, and ER visits in previous 12 months . Vitals . Screenings to include cognitive, depression, and falls . Referrals  and appointments  In addition, I have reviewed and discussed with patient certain preventive protocols, quality metrics, and best practice recommendations. A written personalized care plan for preventive services as well as general preventive health recommendations were provided to patient.   Due to this being a telephonic visit, the after visit summary with patients personalized plan was offered to patient via mail or my-chart. Patient would like to access on my-chart.  Marta Antu, LPN   [id number]  Nurse Health Advisor  Nurse Notes: None

## 2020-05-05 ENCOUNTER — Other Ambulatory Visit: Payer: Self-pay | Admitting: Pulmonary Disease

## 2020-05-05 ENCOUNTER — Ambulatory Visit: Payer: Medicare Other | Admitting: Adult Health

## 2020-05-05 ENCOUNTER — Ambulatory Visit (INDEPENDENT_AMBULATORY_CARE_PROVIDER_SITE_OTHER): Payer: Medicare Other

## 2020-05-05 ENCOUNTER — Ambulatory Visit (INDEPENDENT_AMBULATORY_CARE_PROVIDER_SITE_OTHER): Payer: Medicare Other | Admitting: Pulmonary Disease

## 2020-05-05 ENCOUNTER — Other Ambulatory Visit: Payer: Self-pay

## 2020-05-05 ENCOUNTER — Encounter: Payer: Self-pay | Admitting: Pulmonary Disease

## 2020-05-05 VITALS — BP 118/70 | HR 77 | Temp 97.8°F | Ht 73.0 in | Wt 188.8 lb

## 2020-05-05 DIAGNOSIS — J449 Chronic obstructive pulmonary disease, unspecified: Secondary | ICD-10-CM

## 2020-05-05 DIAGNOSIS — J4489 Other specified chronic obstructive pulmonary disease: Secondary | ICD-10-CM

## 2020-05-05 DIAGNOSIS — Z87891 Personal history of nicotine dependence: Secondary | ICD-10-CM

## 2020-05-05 DIAGNOSIS — Z0000 Encounter for general adult medical examination without abnormal findings: Secondary | ICD-10-CM

## 2020-05-05 DIAGNOSIS — R918 Other nonspecific abnormal finding of lung field: Secondary | ICD-10-CM

## 2020-05-05 DIAGNOSIS — R0602 Shortness of breath: Secondary | ICD-10-CM

## 2020-05-05 NOTE — Assessment & Plan Note (Signed)
2017 spirometry showed moderate obstruction October/2020 CBC with differential showed eosinophil count of 100 August/2017 FeNO-21 No formal hour-long pulmonary function testing on file Walk today in office stable  Plan: Full pulmonary pulmonary function testing ordered Continue Anoro Ellipta Educated patient on use of rescue inhaler when symptomatic Encourage patient to start increasing overall physical mobility and activity, additional education provided on AVS After full PFTs likely will need to consider pulmonary rehab Patient up-to-date with first set of COVID-19 vaccinations and flu vaccine

## 2020-05-05 NOTE — Patient Instructions (Addendum)
You were seen today by Lauraine Rinne, NP  for:   It was a pleasure meeting you today.  We will obtain a breathing test to further evaluate your COPD.  This will be completed in our office.  We will also get a chest x-ray today as well as walking in office.  For right now we will leave you on the Anoro Ellipta inhaler.  Based off the results of your breathing test we may consider switching this.  We will see you back in office for the breathing test in 4 to 6 weeks.  If you would like to have an appointment with [place] after that breathing test please feel free to schedule.  We will also get you scheduled for a 30-minute appointment to establish care with Dr. Halford Chessman.  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 Shortness of breath  - DG Chest 2 View; Future - Pulmonary function test; Future  Walk today in office  Anoro Ellipta  >>> Take 1 puff daily in the morning right when you wake up >>>Rinse your mouth out after use >>>This is a daily maintenance inhaler, NOT a rescue inhaler >>>Contact our office if you are having difficulties affording or obtaining this medication >>>It is important for you to be able to take this daily and not miss any doses   Only use your albuterol as a rescue medication to be used if you can't catch your breath by resting or doing a relaxed purse lip breathing pattern.  - The less you use it, the better it will work when you need it. - Ok to use up to 2 puffs  every 4 hours if you must but call for immediate appointment if use goes up over your usual need - Don't leave home without it !!  (think of it like the spare tire for your car)   Note your daily symptoms > remember "red flags" for COPD:   >>>Increase in cough >>>increase in sputum production >>>increase in shortness of breath or activity  intolerance.   If you notice these symptoms, please call the office to be seen.    3. Preventative health  care  I would recommend that you obtain your COVID-19 booster vaccine if you have not done so already  [place] job already receiving your flu vaccine for fall/2021  Work on increasing overall physical activity, see exercises listed below  4. Former smoker  - DG Chest 2 View; Future  We will get a chest x-ray today measure former smoker  Continue not smoke  We recommend today:  Orders Placed This Encounter  Procedures  . DG Chest 2 View    Standing Status:   Future    Number of Occurrences:   1    Standing Expiration Date:   09/02/2020    Order Specific Question:   Reason for Exam (SYMPTOM  OR DIAGNOSIS REQUIRED)    Answer:   doe    Order Specific Question:   Preferred imaging location?    Answer:   Internal    Order Specific Question:   Radiology Contrast Protocol - do NOT remove file path    Answer:   [email or web address]  . Pulmonary function test    Standing Status:   Future    Standing Expiration Date:   05/05/2021    Order Specific Question:   Where should this test be performed?    Answer:   [facility]    Order Specific Question:   Full PFT: includes the following: basic spirometry, spirometry pre & post bronchodilator, diffusion capacity (DLCO), lung volumes    Answer:   Full PFT   Orders Placed This Encounter  Procedures  . DG Chest 2 View  . Pulmonary function test   No orders of the defined types were placed in this encounter.   Follow Up:    Return in about 2 months (around 07/05/2020), or if symptoms worsen or fail to improve, for Follow up with Dr. Craige Cotta, 30 MINUTE SLOT. Please schedule a 60-minute breathing test at next available time slot Please ensure the next appointment is a 30-minute appointment with Dr. Craige Cotta to establish care, former BQ patient  Notification of test results are managed in the following manner: If there are  any recommendations or changes to the  plan of care discussed in office  today,  we will contact you and let you know what they are. If you do not hear from [place], then your results are normal and you can view them through your  MyChart account , or a letter will be sent to you. Thank you again for trusting [place] with your care  - Thank you, [facility]    It is flu season:   >>> Best ways to protect herself from the flu: Receive the yearly flu vaccine, practice good hand hygiene washing with soap and also using hand sanitizer when available, eat a nutritious meals, get adequate rest, hydrate appropriately       Please contact the office if your symptoms worsen or you have concerns that you are not improving.   Thank you for choosing [facility] for your healthcare, and for allowing [place] to partner with you on your healthcare journey. I am thankful to be able to provide care to you today.   Elisha Headland FNP-C     Sit-to-Stand Exercise  The sit-to-stand exercise (also known as the chair stand or chair rise exercise) strengthens your lower body and helps you maintain or improve your mobility and independence. The goal is to do the sit-to-stand exercise without using your hands. This will be easier as you become stronger. You should always talk with your health care provider before starting any exercise program, especially if you have had recent surgery. Do the exercise exactly as told by your health care provider and adjust it as directed. It is normal to feel mild stretching, pulling, tightness, or discomfort as you do this exercise, but you should stop right away if you feel sudden pain or your pain gets worse. Do not begin doing this exercise until told by your health care provider. What the sit-to-stand exercise does The sit-to-stand exercise helps to strengthen the muscles in your thighs and the muscles in the center of your body that give you stability (core muscles). This exercise is especially helpful if:  You have had knee or hip surgery.  You  have trouble getting up from a chair, out of a car, or off the toilet. How to do the sit-to-stand exercise 1. Sit toward the front edge of a sturdy chair without armrests. Your knees should be bent and your feet should be flat on the floor and shoulder-width apart. 2. Place your hands lightly on each side of the seat. Keep your back and neck as straight as possible, with your chest slightly forward. 3. Breathe in slowly. Lean forward and slightly shift your weight to the front of your feet.  4. Breathe out as you slowly stand up. Use your hands as little as possible. 5. Stand and pause for a full breath in and out. 6. Breathe in as you sit down slowly. Tighten your core and abdominal muscles to control your lowering as much as possible. 7. Breathe out slowly. 8. Do this exercise 10-15 times. If needed, do it fewer times until you build up strength. 9. Rest for 1 minute, then do another set of 10-15 repetitions. To change the difficulty of the sit-to-stand exercise  If the exercise is too difficult, use a chair with sturdy armrests, and push off the armrests to help you come to the standing position. You can also use the armrests to help slowly lower yourself back to sitting. As this gets easier, try to use your arms less. You can also place a firm cushion or pillow on the chair to make the surface higher.  If this exercise is too easy, do not use your arms to help raise or lower yourself. You can also wear a weighted vest, use hand weights, increase your repetitions, or try a lower chair. General tips  You may feel tired when starting an exercise routine. This is normal.  You may have muscle soreness that lasts a few days. This is normal. As you get stronger, you may not feel muscle soreness.  Use smooth, steady movements.  Do not  hold your breath during strength exercises. This can cause unsafe changes in your blood pressure.  Breathe in slowly through your nose, and breathe out slowly  through your mouth. Summary  Strengthening your lower body is an important step to help you move safely and independently.  The sit-to-stand exercise helps strengthen the muscles in your thighs and core.  You should always talk with your health care provider before starting any exercise program, especially if you have had recent surgery. This information is not intended to replace advice given to you by your health care provider. Make sure you discuss any questions you have with your health care provider. Document Revised: 04/05/2018 Document Reviewed: 07/29/2016 Elsevier Patient Education  [place].  Exercises To Do While Sitting  Exercises that you do while sitting (chair exercises) can give you many of the same benefits as full exercise. Benefits include strengthening your heart, burning calories, and keeping muscles and joints healthy. Exercise can also improve your mood and help with depression and anxiety. You may benefit from chair exercises if you are unable to do standing exercises because of:  Diabetic foot pain.  Obesity.  Illness.  Arthritis.  Recovery from surgery or injury.  Breathing problems.  Balance problems.  Another type of disability. Before starting chair exercises, check with your health care provider or a physical therapist to find out how much exercise you can tolerate and which exercises are safe for you. If your health care provider approves:  Start out slowly and build up over time. Aim to work up to about 10-20 minutes for each exercise session.  Make exercise part of your daily routine.  Drink water when you exercise. Do not wait until you are thirsty. Drink every 10-15 minutes.  Stop exercising right away if you have pain, nausea, shortness of breath, or dizziness.  If you are exercising in a wheelchair, make sure to lock the wheels.  Ask your health care provider whether you can do tai chi or yoga. Many positions in these mind-body  exercises can be modified to do while seated. Warm-up Before starting  other exercises: 1. Sit up as straight as you can. Have your knees bent at 90 degrees, which is the shape of the capital letter "L." Keep your feet flat on the floor. 2. Sit at the front edge of your chair, if you can. 3. Pull in (tighten) the muscles in your abdomen and stretch your spine and neck as straight as you can. Hold this position for a few minutes. 4. Breathe in and out evenly. Try to concentrate on your breathing, and relax your mind. Stretching Exercise A: Arm stretch 1. Hold your arms out straight in front of your body. 2. Bend your hands at the wrist with your fingers pointing up, as if signaling someone to stop. Notice the slight tension in your forearms as you hold the position. 3. Keeping your arms out and your hands bent, rotate your hands outward as far as you can and hold this stretch. Aim to have your thumbs pointing up and your pinkie fingers pointing down. Slowly repeat arm stretches for one minute as tolerated. Exercise B: Leg stretch 1. If you can move your legs, try to "draw" letters on the floor with the toes of your foot. Write your name with one foot. 2. Write your name with the toes of your other foot. Slowly repeat the movements for one minute as tolerated. Exercise C: Reach for the sky 1. Reach your hands as far over your head as you can to stretch your spine. 2. Move your hands and arms as if you are climbing a rope. Slowly repeat the movements for one minute as tolerated. Range of motion exercises Exercise A: Shoulder roll 1. Let your arms hang loosely at your sides. 2. Lift just your shoulders up toward your ears, then let them relax back down. 3. When your shoulders feel loose, rotate your shoulders in backward and forward circles. Do shoulder rolls slowly for one minute as tolerated. Exercise B: March in place 1. As if you are marching, pump your arms and lift your legs up and down.  Lift your knees as high as you can. ? If you are unable to lift your knees, just pump your arms and move your ankles and feet up and down. March in place for one minute as tolerated. Exercise C: Seated jumping jacks 1. Let your arms hang down straight. 2. Keeping your arms straight, lift them up over your head. Aim to point your fingers to the ceiling. 3. While you lift your arms, straighten your legs and slide your heels along the floor to your sides, as wide as you can. 4. As you bring your arms back down to your sides, slide your legs back together. ? If you are unable to use your legs, just move your arms. Slowly repeat seated jumping jacks for one minute as tolerated. Strengthening exercises Exercise A: Shoulder squeeze 1. Hold your arms straight out from your body to your sides, with your elbows bent and your fists pointed at the ceiling. 2. Keeping your arms in the bent position, move them forward so your elbows and forearms meet in front of your face. 3. Open your arms back out as wide as you can with your elbows still bent, until you feel your shoulder blades squeezing together. Hold for 5 seconds. Slowly repeat the movements forward and backward for one minute as tolerated. Contact a health care provider if you:  Had to stop exercising due to any of the following: ? Pain. ? Nausea. ? Shortness of breath. ? Dizziness. ?  Fatigue.  Have significant pain or soreness after exercising. Get help right away if you have:  Chest pain.  Difficulty breathing. These symptoms may represent a serious problem that is an emergency. Do not wait to see if the symptoms will go away. Get medical help right away. Call your local emergency services (911 in the [place] Do not drive yourself to the hospital. This information is not intended to replace advice given to you by your health care provider. Make sure you discuss any questions you have with your health care provider. Document Revised:  09/28/2018 Document Reviewed: 04/20/2017 Elsevier Patient Education  [place].  COPD and Physical Activity Chronic obstructive pulmonary disease (COPD) is a long-term (chronic) condition that affects the lungs. COPD is a general term that can be used to describe many different lung problems that cause lung swelling (inflammation) and limit airflow, including chronic bronchitis and emphysema. The main symptom of COPD is shortness of breath, which makes it harder to do even simple tasks. This can also make it harder to exercise and be active. Talk with your health care provider about treatments to help you breathe better and actions you can take to prevent breathing problems during physical activity. What are the benefits of exercising with COPD? Exercising regularly is an important part of a healthy lifestyle. You can still exercise and do physical activities even though you have COPD. Exercise and physical activity improve your shortness of breath by increasing blood flow (circulation). This causes your heart to pump more oxygen through your body. Moderate exercise can improve your:  Oxygen use.  Energy level.  Shortness of breath.  Strength in your breathing muscles.  Heart health.  Sleep.  Self-esteem and feelings of self-worth.  Depression, stress, and anxiety levels. Exercise can benefit everyone with COPD. The severity of your disease may affect how hard you can exercise, especially at first, but everyone can benefit. Talk with your health care provider about how much exercise is safe for you, and which activities and exercises are safe for you. What actions can I take to prevent breathing problems during physical activity?  Sign up for a pulmonary rehabilitation program. This type of program may include: ? Education about lung diseases. ? Exercise classes that teach you how to exercise and be more active while improving your breathing. This usually involves:  Exercise  using your lower extremities, such as a stationary bicycle.  About 30 minutes of exercise, 2 to 5 times per week, for 6 to 12 weeks  Strength training, such as push ups or leg lifts. ? Nutrition education. ? Group classes in which you can talk with others who also have COPD and learn ways to manage stress.  If you use an oxygen tank, you should use it while you exercise. Work with your health care provider to adjust your oxygen for your physical activity. Your resting flow rate is different from your flow rate during physical activity.  While you are exercising: ? Take slow breaths. ? Pace yourself and do not try to go too fast. ? Purse your lips while breathing out. Pursing your lips is similar to a kissing or whistling position. ? If doing exercise that uses a quick burst of effort, such as weight lifting:  Breathe in before starting the exercise.  Breathe out during the hardest part of the exercise (such as raising the weights). Where to find support You can find support for exercising with COPD from:  Your health care provider.  A  pulmonary rehabilitation program.  Your local health department or community health programs.  Support groups, online or in-person. Your health care provider may be able to recommend support groups. Where to find more information You can find more information about exercising with COPD from:  American Lung Association: [employer].  COPD Foundation: https://www.rivera.net/. Contact a health care provider if:  Your symptoms get worse.  You have chest pain.  You have nausea.  You have a fever.  You have trouble talking or catching your breath.  You want to start a new exercise program or a new activity. Summary  COPD is a general term that can be used to describe many different lung problems that cause lung swelling (inflammation) and limit airflow. This includes chronic bronchitis and emphysema.  Exercise and physical activity improve your  shortness of breath by increasing blood flow (circulation). This causes your heart to provide more oxygen to your body.  Contact your health care provider before starting any exercise program or new activity. Ask your health care provider what exercises and activities are safe for you. This information is not intended to replace advice given to you by your health care provider. Make sure you discuss any questions you have with your health care provider. Document Revised: 09/27/2018 Document Reviewed: 06/30/2017 Elsevier Patient Education  2020 [employer].

## 2020-05-05 NOTE — Assessment & Plan Note (Signed)
Plan: Obtain COVID-19 booster vaccination Up-to-date with flu vaccine

## 2020-05-05 NOTE — Assessment & Plan Note (Signed)
Worsened dyspnea over the last year  Discussion: Suspect the worsening dyspnea is likely multifactorial given patient's suspected COPD, needs pulmonary function testing, increased sedentary lifestyle due to the COVID-19 pandemic.  Plan: Walk today in office stable with no oxygen desaturations We will order full pulmonary function testing to further evaluate COPD

## 2020-05-05 NOTE — Assessment & Plan Note (Signed)
Plan: Not a candidate for the lung cancer screening program given the fact the patient stop smoking over 15 years ago Chest x-ray today

## 2020-05-05 NOTE — Progress Notes (Signed)
@Patient  ID: Russell Brown, male    DOB: [birth year], 74 y.o.   MRN: [id number]  Chief Complaint  Patient presents with  . Follow-up    COPD    Referring provider: Libby Maw  HPI:  74 year old male former smoker followed in our office for COPD  PMH: Hypothyroidism, hyperlipidemia, hypertension, GERD, anxiety Smoker/ Smoking History: Former smoker.  Quit 1998.  35-pack-year smoking history Maintenance: Anoro Ellipta Pt of: Dr. Halford Chessman  05/05/2020  - Visit   74 year old male former smoker followed in our office for COPD.  Patient was last seen in our office in October/2020.  He is established with Dr. Halford Chessman.  He was seen by TP NP.  He was encouraged to switch inhalers from [place] to [employer].  Chest x-ray was performed.  He is also encouraged to follow-up with our office in 3 months.  Patient presented to her office today reporting he has been more shortness of breath over the last year.  He does admit that he has not been as active given the COVID-19 pandemic.  He recently tried to go on a hike which is only 1 mile long he reported that he had to take multiple breaks due to shortness of breath.  Only a couple of years ago patient was able to go on mile long hikes with a ease.  Was averaging over 5 miles hike.  This is concerning for him.  He remains adherent to Anoro Ellipta.  He does not use his rescue inhaler often.  He denies prednisone or antibiotics recently for any sort of COPD exacerbations.  He has an occasional cough in the morning with clear sputum.  He feels this is at his baseline.  He has received his first set of COVID-19 vaccinations.  He has received his seasonal flu vaccine.  Patient walked in office today was able to complete multiple laps in office without any oxygen desaturations.  Questionaires / Pulmonary Flowsheets:   ACT:  No flowsheet data found.  MMRC: mMRC Dyspnea Scale mMRC Score  05/05/2020 2    Epworth:  No flowsheet data  found.  Tests:   04/04/2019-chest x-ray-stable probable scarring is noted throughout both lungs, stable findings consistent with COPD, no acute cardiopulmonary abnormality is noted  04/04/2019-CBC with differential-eosinophils relative 1.7, eosinophils absolute 0.1  03/25/2018-split-night sleep study-AHI 1.1  02/19/2016-spirometry-FVC 3.6, ratio 57, FEV1 2.0 (56% predicted) >>>Moderately severe obstruction  FENO:  Lab Results  Component Value Date   NITRICOXIDE 21 02/19/2016    PFT: No flowsheet data found.  WALK:  SIX MIN WALK 05/05/2020  Supplimental Oxygen during Test? (L/min) No  Tech Comments: Average pace with no desats. Tolerated well.    Imaging: No results found.  Lab Results:  CBC    Component Value Date/Time   WBC 7.1 03/17/2020 0851   RBC 5.20 03/17/2020 0851   HGB 15.3 03/17/2020 0851   HCT 46.3 03/17/2020 0851   PLT 196.0 03/17/2020 0851   MCV 89.0 03/17/2020 0851   MCH 30.0 04/26/2018 0913   MCHC 33.1 03/17/2020 0851   RDW 14.3 03/17/2020 0851   LYMPHSABS 1.1 04/04/2019 1008   MONOABS 0.8 04/04/2019 1008   EOSABS 0.1 04/04/2019 1008   BASOSABS 0.0 04/04/2019 1008    BMET    Component Value Date/Time   NA 141 03/17/2020 0851   K 3.5 03/17/2020 0851   CL 104 03/17/2020 0851   CO2 27 03/17/2020 0851   GLUCOSE 113 (H) 03/17/2020 0851   GLUCOSE  98 04/22/2006 1057   BUN 15 03/17/2020 0851   CREATININE 1.11 03/17/2020 0851   CALCIUM 9.0 03/17/2020 0851   GFRNONAA >60 04/26/2018 0913   GFRAA >60 04/26/2018 0913    BNP No results found for: BNP  ProBNP    Component Value Date/Time   PROBNP 91.2 02/20/2012 1257    Specialty Problems      Pulmonary Problems   COPD with asthma [facility])    August 2017 simple spirometry ratio 57%, FEV1 2.05 L 56% predicted, FVC 3.61 L, 73% predicted August 2017 exhaled nitric oxide testing 21 ppm      Asthma    Qualifier: Diagnosis of  By: Jerold Coombe        CAP (community acquired pneumonia)    Dyspnea   Vasomotor rhinitis   Acute bronchitis      Allergies  Allergen Reactions  . Codeine Anxiety    Reaction if taken for extended periods of time.    Immunization History  Administered Date(s) Administered  . Fluad Quad(high Dose 65+) 03/16/2019  . H1N1 05/28/2008  . Influenza Whole 04/28/2007, 03/26/2008, 04/16/2009, 05/07/2010, 04/10/2011, 04/13/2012  . Influenza, High Dose Seasonal PF 03/05/2014, 03/17/2015, 03/17/2018, 03/26/2019  . Influenza, Seasonal, Injecte, Preservative Fre 03/06/2013  . Influenza,inj,Quad PF,6+ Mos 03/04/2016  . Influenza-Unspecified 03/22/2011, 03/11/2020  . Moderna SARS-COVID-2 Vaccination 08/06/2019, 09/04/2019  . Pneumococcal Conjugate-13 12/05/2014  . Pneumococcal Polysaccharide-23 04/11/2008, 04/18/2013, 03/16/2019  . Td 05/22/2009, 02/16/2011  . Zoster 05/22/2009  . Zoster Recombinat (Shingrix) 03/21/2017, 09/10/2017    Past Medical History:  Diagnosis Date  . 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 Asthma    pt has albuteral inhaler.  [name] Kitchen COPD (chronic obstructive pulmonary disease) ([place])   . GERD (gastroesophageal reflux disease)   . History of hiatal hernia   . Hyperlipemia   . Hypertension   . Hypothyroidism   . Pneumonia 2013; 2017  . PTSD (post-traumatic stress disorder)    retired Engineer, structural   . Sleep apnea    mild - but does not wear a c-pap per pt  . Thyroid disease    thyroid nodules - half of thyroid was removed per pt    Tobacco History: Social History   Tobacco Use  Smoking Status Former Smoker  . Packs/day: 1.00  . Years: 35.00  . Pack years: 35.00  . Types: Cigarettes  . Quit date: 08/18/1996  . Years since quitting: 23.7  Smokeless Tobacco Never Used   Counseling given: Not Answered   Continue to not smoke  Outpatient Encounter Medications as of 05/05/2020  Medication Sig  . albuterol (PROAIR HFA) 108 (90 Base) MCG/ACT inhaler Inhale 2 puffs into  the lungs every 6 (six) hours as needed for wheezing or shortness of breath.  Jearl Klinefelter ELLIPTA 62.5-25 MCG/INH AEPB USE 1 INHALATION BY MOUTH  ONCE DAILY AT THE SAME TIME EACH DAY  . aspirin EC 81 MG tablet Take 81 mg by mouth daily.  [name] Kitchen atorvastatin (LIPITOR) 20 MG tablet Take 1 tablet (20 mg total) by mouth daily. Pt needs OV for further refills.  . dutasteride (AVODART) 0.5 MG capsule TAKE 1 CAPSULE BY MOUTH  DAILY  . fish oil-omega-3 fatty acids 1000 MG capsule Take 2 g by mouth daily.   [name] Kitchen levothyroxine (SYNTHROID) 75 MCG tablet TAKE 1 TABLET BY MOUTH  DAILY BEFORE BREAKFAST  . lisinopril-hydrochlorothiazide (ZESTORETIC) 10-12.5 MG tablet Take 1 tablet by mouth daily. Pt needs  OV for further refills.  . Multiple Vitamin (MULTIVITAMIN WITH MINERALS) TABS tablet Take 1 tablet by mouth daily.  [name] Kitchen omeprazole (PRILOSEC) 40 MG capsule Take 1 capsule (40 mg total) by mouth 2 (two) times daily. Office visit for further refills  . sertraline (ZOLOFT) 50 MG tablet Take 1 tablet (50 mg total) by mouth daily.  . nitroGLYCERIN (NITROSTAT) 0.4 MG SL tablet Place 1 tablet (0.4 mg total) under the tongue every 5 (five) minutes as needed.   No facility-administered encounter medications on file as of 05/05/2020.     Review of Systems  Review of Systems  Constitutional: Positive for fatigue. Negative for activity change, chills, fever and unexpected weight change.  HENT: Positive for congestion (small amount of clear mucous). Negative for postnasal drip, rhinorrhea, sinus pressure, sinus pain and sore throat.   Eyes: Negative.   Respiratory: Positive for cough (usually in mornings) and shortness of breath. Negative for wheezing.   Cardiovascular: Negative for chest pain and palpitations.  Gastrointestinal: Negative for constipation, diarrhea, nausea and vomiting.  Endocrine: Negative.   Genitourinary: Negative.   Musculoskeletal: Negative.   Skin: Negative.   Neurological: Negative for dizziness and  headaches.  Psychiatric/Behavioral: Negative.  Negative for dysphoric mood. The patient is not nervous/anxious.   All other systems reviewed and are negative.    Physical Exam  BP 118/70 (BP Location: Left Arm, Cuff Size: Normal)   Pulse 77   Temp 97.8 F (36.6 C) (Oral)   Ht 6\' 1"  (1.854 m)   Wt 188 lb 12.8 oz (85.6 kg)   SpO2 98%   BMI 24.91 kg/m   Wt Readings from Last 5 Encounters:  05/05/20 188 lb 12.8 oz (85.6 kg)  04/24/20 186 lb (84.4 kg)  03/17/20 186 lb 9.6 oz (84.6 kg)  04/04/19 189 lb 12.8 oz (86.1 kg)  03/21/19 190 lb (86.2 kg)    BMI Readings from Last 5 Encounters:  05/05/20 24.91 kg/m  04/24/20 24.88 kg/m  03/17/20 24.96 kg/m  04/04/19 25.35 kg/m  03/21/19 25.07 kg/m     Physical Exam Vitals and nursing note reviewed.  Constitutional:      General: He is not in acute distress.    Appearance: Normal appearance. He is normal weight.  HENT:     Head: Normocephalic and atraumatic.     Right Ear: Hearing, tympanic membrane, ear canal and external ear normal. There is no impacted cerumen.     Left Ear: Hearing, tympanic membrane, ear canal and external ear normal. There is no impacted cerumen.     Nose: Rhinorrhea present. No mucosal edema.     Right Turbinates: Not enlarged.     Left Turbinates: Not enlarged.     Mouth/Throat:     Mouth: Mucous membranes are dry.     Pharynx: Oropharynx is clear. No oropharyngeal exudate.  Eyes:     Pupils: Pupils are equal, round, and reactive to light.  Cardiovascular:     Rate and Rhythm: Normal rate and regular rhythm.     Pulses: Normal pulses.     Heart sounds: Normal heart sounds. No murmur heard.   Pulmonary:     Effort: Pulmonary effort is normal.     Breath sounds: Normal breath sounds. No decreased breath sounds, wheezing or rales.  Musculoskeletal:     Cervical back: Normal range of motion.     Right lower leg: No edema.     Left lower leg: No edema.  Lymphadenopathy:     Cervical: No  cervical adenopathy.  Skin:    General: Skin is warm and dry.     Capillary Refill: Capillary refill takes less than 2 seconds.     Findings: No erythema or rash.  Neurological:     General: No focal deficit present.     Mental Status: He is alert and oriented to person, place, and time.     Motor: No weakness.     Coordination: Coordination normal.     Gait: Gait is intact. Gait normal.  Psychiatric:        Mood and Affect: Mood normal.        Behavior: Behavior normal. Behavior is cooperative.        Thought Content: Thought content normal.        Judgment: Judgment normal.       Assessment & Plan:   COPD with asthma ([place]) 2017 spirometry showed moderate obstruction October/2020 CBC with differential showed eosinophil count of 100 August/2017 FeNO-21 No formal hour-long pulmonary function testing on file Walk today in office stable  Plan: Full pulmonary pulmonary function testing ordered Continue Anoro Ellipta Educated patient on use of rescue inhaler when symptomatic Encourage patient to start increasing overall physical mobility and activity, additional education provided on AVS After full PFTs likely will need to consider pulmonary rehab Patient up-to-date with first set of COVID-19 vaccinations and flu vaccine  Dyspnea Worsened dyspnea over the last year  Discussion: Suspect the worsening dyspnea is likely multifactorial given patient's suspected COPD, needs pulmonary function testing, increased sedentary lifestyle due to the COVID-19 pandemic.  Plan: Walk today in office stable with no oxygen desaturations We will order full pulmonary function testing to further evaluate COPD   Former smoker Plan: Not a candidate for the lung cancer screening program given the fact the patient stop smoking over 15 years ago Chest x-ray today   Preventative health care Plan: Obtain COVID-19 booster vaccination Up-to-date with flu vaccine    Return in about 2 months  (around 07/05/2020), or if symptoms worsen or fail to improve, for Follow up with Dr. Halford Chessman, [place].   Lauraine Rinne, NP 05/05/2020   This appointment required 34 minutes of patient care (this includes precharting, chart review, review of results, face-to-face care, etc.).

## 2020-05-06 ENCOUNTER — Other Ambulatory Visit (INDEPENDENT_AMBULATORY_CARE_PROVIDER_SITE_OTHER): Payer: Medicare Other

## 2020-05-06 ENCOUNTER — Other Ambulatory Visit: Payer: Self-pay | Admitting: Pulmonary Disease

## 2020-05-06 DIAGNOSIS — R0602 Shortness of breath: Secondary | ICD-10-CM

## 2020-05-06 DIAGNOSIS — R918 Other nonspecific abnormal finding of lung field: Secondary | ICD-10-CM

## 2020-05-06 LAB — SEDIMENTATION RATE: Sed Rate: 4 mm/hr (ref 0–20)

## 2020-05-06 LAB — BRAIN NATRIURETIC PEPTIDE: Pro B Natriuretic peptide (BNP): 26 pg/mL (ref 0.0–100.0)

## 2020-05-06 NOTE — Progress Notes (Signed)
See telephone note.  High-resolution CT chest ordered.  Wyn Quaker, FNP

## 2020-05-06 NOTE — Progress Notes (Signed)
Reviewed and agree with assessment/plan.   Chesley Mires, MD [facility] Pulmonary/Critical Care 05/06/2020, 8:56 AM Pager:  [phone]

## 2020-05-12 ENCOUNTER — Other Ambulatory Visit: Payer: Self-pay

## 2020-05-12 ENCOUNTER — Ambulatory Visit (HOSPITAL_BASED_OUTPATIENT_CLINIC_OR_DEPARTMENT_OTHER)
Admission: RE | Admit: 2020-05-12 | Discharge: 2020-05-12 | Disposition: A | Discharge status: Home / Self Care | Payer: Medicare Other | Source: Ambulatory Visit | Attending: Pulmonary Disease | Admitting: Pulmonary Disease

## 2020-05-12 DIAGNOSIS — R0602 Shortness of breath: Secondary | ICD-10-CM | POA: Insufficient documentation

## 2020-05-28 ENCOUNTER — Other Ambulatory Visit: Payer: Self-pay

## 2020-05-28 ENCOUNTER — Ambulatory Visit (INDEPENDENT_AMBULATORY_CARE_PROVIDER_SITE_OTHER): Payer: Medicare Other | Admitting: Pulmonary Disease

## 2020-05-28 DIAGNOSIS — J449 Chronic obstructive pulmonary disease, unspecified: Secondary | ICD-10-CM

## 2020-05-28 NOTE — Progress Notes (Signed)
PFT completed today.  

## 2020-06-04 ENCOUNTER — Other Ambulatory Visit: Payer: Self-pay | Admitting: Family Medicine

## 2020-06-04 DIAGNOSIS — E039 Hypothyroidism, unspecified: Secondary | ICD-10-CM

## 2020-06-06 ENCOUNTER — Other Ambulatory Visit: Payer: Self-pay | Admitting: Internal Medicine

## 2020-06-06 ENCOUNTER — Other Ambulatory Visit: Payer: Self-pay

## 2020-06-06 ENCOUNTER — Encounter: Payer: Self-pay | Admitting: Family Medicine

## 2020-06-06 MED ORDER — ANORO ELLIPTA 62.5-25 MCG/INH IN AEPB: 1.0000 | INHALATION_SPRAY | Freq: Every day | RESPIRATORY_TRACT | 3 refills | Status: DC

## 2020-06-06 MED ORDER — OMEPRAZOLE 40 MG PO CPDR
40.0000 mg | DELAYED_RELEASE_CAPSULE | Freq: Two times a day (BID) | ORAL | 0 refills | Status: DC
Start: 2020-06-06 — End: 2020-08-13

## 2020-06-17 NOTE — Progress Notes (Signed)
HPI: FU CP. Patient admitted with chest pain September 2019. Cardiac catheterization September 2019 showed a 25% right coronary artery and 50 to 55% ejection fraction. Abd ultrasound 10/20 showed no aneurysm.   Chest CT November 2021 showed severe emphysema, findings suggestive of mild pulmonary fibrosis and coronary artery disease.  Since last seen patient states that for the past 6 months he has had occasional chest pressure.  It is sudden in onset, lasts approximately 2 minutes and resolves spontaneously.  No radiation or associated symptoms.  Not pleuritic or related to food.  Note he can walk extended distances with no chest pain and he does not have any symptoms with any exertion.  He notes some dyspnea on exertion due to his COPD but no orthopnea, PND or pedal edema.  No syncope.  Current Outpatient Medications  Medication Sig Dispense Refill  . albuterol (PROAIR HFA) 108 (90 Base) MCG/ACT inhaler Inhale 2 puffs into the lungs every 6 (six) hours as needed for wheezing or shortness of breath. 18 g 1  . aspirin EC 81 MG tablet Take 81 mg by mouth daily.    [name] Kitchen atorvastatin (LIPITOR) 20 MG tablet Take 1 tablet (20 mg total) by mouth daily. Pt needs OV for further refills. 90 tablet 1  . dutasteride (AVODART) 0.5 MG capsule TAKE 1 CAPSULE BY MOUTH  DAILY 90 capsule 3  . fish oil-omega-3 fatty acids 1000 MG capsule Take 2 g by mouth daily.     [name] Kitchen levothyroxine (SYNTHROID) 75 MCG tablet TAKE 1 TABLET BY MOUTH  DAILY BEFORE BREAKFAST 90 tablet 3  . lisinopril-hydrochlorothiazide (ZESTORETIC) 10-12.5 MG tablet Take 1 tablet by mouth daily. Pt needs OV for further refills. 90 tablet 1  . Multiple Vitamin (MULTIVITAMIN WITH MINERALS) TABS tablet Take 1 tablet by mouth daily.    [name] Kitchen omeprazole (PRILOSEC) 40 MG capsule Take 1 capsule (40 mg total) by mouth 2 (two) times daily. Office visit for further refills 180 capsule 0  . sertraline (ZOLOFT) 50 MG tablet Take 1 tablet (50 mg total) by mouth daily.  90 tablet 2  . umeclidinium-vilanterol (ANORO ELLIPTA) 62.5-25 MCG/INH AEPB Inhale 1 puff into the lungs daily. 180 each 3  . nitroGLYCERIN (NITROSTAT) 0.4 MG SL tablet Place 1 tablet (0.4 mg total) under the tongue every 5 (five) minutes as needed. 25 tablet 3   No current facility-administered medications for this visit.     Past Medical History:  Diagnosis Date  . 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 Asthma    pt has albuteral inhaler.  [name] Kitchen COPD (chronic obstructive pulmonary disease) ([facility])   . GERD (gastroesophageal reflux disease)   . History of hiatal hernia   . Hyperlipemia   . Hypertension   . Hypothyroidism   . Pneumonia 2013; 2017  . PTSD (post-traumatic stress disorder)    retired Emergency planning/management officer   . Sleep apnea    mild - but does not wear a c-pap per pt  . Thyroid disease    thyroid nodules - half of thyroid was removed per pt    Past Surgical History:  Procedure Laterality Date  . APPENDECTOMY    . BACK SURGERY    . CARDIAC CATHETERIZATION  1980s   "no blockage at that time" (02/20/2018)  . CERVICAL SPINE SURGERY     "enlarged hole that nerves went thru"  . FINGER AMPUTATION Left 2004   2nd and 3rd digits; "  table saw injury"  . FOOT FRACTURE SURGERY Left    "heel OR; put 2 steel rods in and took them out 6 wks later"  . FRACTURE SURGERY    . HERNIA REPAIR Right 1965  . LEFT HEART CATH AND CORONARY ANGIOGRAPHY N/A 02/21/2018   Procedure: LEFT HEART CATH AND CORONARY ANGIOGRAPHY;  Surgeon: Troy Sine, MD;  Location: [facility];  Service: Cardiovascular;  Laterality: N/A;  . NASAL SEPTUM SURGERY  1986  . NISSEN FUNDOPLICATION  [id number]   "w/hernia repair"  . THYROIDECTOMY, PARTIAL  2000s  . WISDOM TOOTH EXTRACTION      Social History   Socioeconomic History  . Marital status: Married    Spouse name: Not on file  . Number of children: 4  . Years of education: Not on file  . Highest education level: Not  on file  Occupational History  . Occupation: retired Corporate treasurer: [employer]  Tobacco Use  . Smoking status: Former Smoker    Packs/day: 1.00    Years: 35.00    Pack years: 35.00    Types: Cigarettes    Quit date: 08/18/1996    Years since quitting: 23.8  . Smokeless tobacco: Never Used  Vaping Use  . Vaping Use: Never used  Substance and Sexual Activity  . Alcohol use: Yes    Alcohol/week: 7.0 standard drinks    Types: 7 Cans of beer per week  . Drug use: Never  . Sexual activity: Not Currently    Partners: Female  Other Topics Concern  . Not on file  Social History Narrative   Exercise- weights and walk on treadmill   Social Determinants of Health   Financial Resource Strain: Low Risk   . Difficulty of Paying Living Expenses: Not hard at all  Food Insecurity: No Food Insecurity  . Worried About Charity fundraiser in the Last Year: Never true  . Ran Out of Food in the Last Year: Never true  Transportation Needs: No Transportation Needs  . Lack of Transportation (Medical): No  . Lack of Transportation (Non-Medical): No  Physical Activity: Inactive  . Days of Exercise per Week: 0 days  . Minutes of Exercise per Session: 0 min  Stress: No Stress Concern Present  . Feeling of Stress : Not at all  Social Connections: Socially Integrated  . Frequency of Communication with Friends and Family: More than three times a week  . Frequency of Social Gatherings with Friends and Family: Once a week  . Attends Religious Services: More than 4 times per year  . Active Member of Clubs or Organizations: Yes  . Attends Archivist Meetings: More than 4 times per year  . Marital Status: Married  Human resources officer Violence: Not At Risk  . Fear of Current or Ex-Partner: No  . Emotionally Abused: No  . Physically Abused: No  . Sexually Abused: No    Family History  Problem Relation Age of Onset  . Parkinsonism Mother   . Osteoporosis Mother   .  Pneumonia Mother        12  . Cancer Father        prostate,  skin  . Hyperlipidemia Father   . Hypertension Father   . Prostate cancer Father   . Asthma Son   . Osteoporosis Paternal Aunt   . Alzheimer's disease Paternal Uncle   . Osteoporosis Maternal Grandmother   . Cancer Maternal Grandmother  lung  . Heart disease Paternal Grandmother        MI  . Heart disease Paternal Grandfather        MI  . Colon cancer Neg Hx   . Pancreatic cancer Neg Hx   . Stomach cancer Neg Hx   . Esophageal cancer Neg Hx   . Liver cancer Neg Hx   . Rectal cancer Neg Hx     ROS: no fevers or chills, productive cough, hemoptysis, dysphasia, odynophagia, melena, hematochezia, dysuria, hematuria, rash, seizure activity, orthopnea, PND, pedal edema, claudication. Remaining systems are negative.  Physical Exam: Well-developed well-nourished in no acute distress.  Skin is warm and dry.  HEENT is normal.  Neck is supple.  Chest is clear to auscultation with normal expansion.  Cardiovascular exam is regular rate and rhythm.  Abdominal exam nontender or distended. No masses palpated. Extremities show no edema. neuro grossly intact  ECG-normal sinus rhythm at a rate of 65, no significant ST changes.  Personally reviewed  A/P  1 chest pain-symptoms are somewhat atypical.  He does not have exertional chest pain.  Electrocardiogram shows no ST changes.  Previous catheterization showed very mild coronary disease.  We discussed a functional study today but he declined and would prefer to be conservative at this point.  We will consider in the future if his symptoms worsen.  2 coronary artery disease-continue aspirin and statin.  3 emphysema/pulmonary fibrosis-Per pulmonary.  4 hypertension-patient's blood pressure is controlled today.  Continue present medications and follow.  5 hyperlipidemia-continue statin.  Kirk Ruths, MD

## 2020-06-25 ENCOUNTER — Other Ambulatory Visit: Payer: Self-pay | Admitting: Cardiology

## 2020-06-25 ENCOUNTER — Encounter: Payer: Self-pay | Admitting: Cardiology

## 2020-06-25 ENCOUNTER — Other Ambulatory Visit: Payer: Self-pay

## 2020-06-25 ENCOUNTER — Ambulatory Visit (INDEPENDENT_AMBULATORY_CARE_PROVIDER_SITE_OTHER): Payer: Medicare Other | Admitting: Cardiology

## 2020-06-25 VITALS — BP 126/80 | HR 65 | Ht 73.0 in | Wt 189.8 lb

## 2020-06-25 DIAGNOSIS — E785 Hyperlipidemia, unspecified: Secondary | ICD-10-CM | POA: Diagnosis not present

## 2020-06-25 DIAGNOSIS — I10 Essential (primary) hypertension: Secondary | ICD-10-CM | POA: Diagnosis not present

## 2020-06-25 DIAGNOSIS — I2510 Atherosclerotic heart disease of native coronary artery without angina pectoris: Secondary | ICD-10-CM | POA: Diagnosis not present

## 2020-06-25 DIAGNOSIS — R072 Precordial pain: Secondary | ICD-10-CM

## 2020-06-25 MED ORDER — NITROGLYCERIN 0.4 MG SL SUBL: 0.4000 mg | SUBLINGUAL_TABLET | SUBLINGUAL | 3 refills | Status: DC | PRN

## 2020-06-25 MED FILL — NITROGLYCERIN 0.4 MG TAB SL: 0.4 | 7 days supply | Qty: 25 | Fill #0

## 2020-06-25 NOTE — Patient Instructions (Signed)

## 2020-07-04 ENCOUNTER — Ambulatory Visit (INDEPENDENT_AMBULATORY_CARE_PROVIDER_SITE_OTHER): Payer: Medicare Other | Admitting: Pulmonary Disease

## 2020-07-04 ENCOUNTER — Other Ambulatory Visit: Payer: Self-pay

## 2020-07-04 ENCOUNTER — Encounter: Payer: Self-pay | Admitting: Pulmonary Disease

## 2020-07-04 VITALS — BP 124/74 | HR 69 | Temp 97.0°F | Ht 73.0 in | Wt 189.0 lb

## 2020-07-04 DIAGNOSIS — J432 Centrilobular emphysema: Secondary | ICD-10-CM | POA: Diagnosis not present

## 2020-07-04 DIAGNOSIS — J849 Interstitial pulmonary disease, unspecified: Secondary | ICD-10-CM

## 2020-07-04 DIAGNOSIS — R0600 Dyspnea, unspecified: Secondary | ICD-10-CM

## 2020-07-04 DIAGNOSIS — R0609 Other forms of dyspnea: Secondary | ICD-10-CM

## 2020-07-04 LAB — PULMONARY FUNCTION TEST
DL/VA % pred: 59 %
DL/VA: 2.36 ml/min/mmHg/L
DLCO cor % pred: 49 %
DLCO cor: 13.73 ml/min/mmHg
DLCO unc % pred: 49 %
DLCO unc: 13.73 ml/min/mmHg
FEF 25-75 Post: 1.05 L/sec
FEF 25-75 Pre: 0.7 L/sec
FEF2575-%Change-Post: 49 %
FEF2575-%Pred-Post: 40 %
FEF2575-%Pred-Pre: 27 %
FEV1-%Change-Post: 7 %
FEV1-%Pred-Post: 58 %
FEV1-%Pred-Pre: 54 %
FEV1-Post: 2.04 L
FEV1-Pre: 1.9 L
FEV1FVC-%Change-Post: -3 %
FEV1FVC-%Pred-Pre: 72 %
FEV6-%Change-Post: 10 %
FEV6-%Pred-Post: 82 %
FEV6-%Pred-Pre: 75 %
FEV6-Post: 3.74 L
FEV6-Pre: 3.39 L
FEV6FVC-%Change-Post: 0 %
FEV6FVC-%Pred-Post: 99 %
FEV6FVC-%Pred-Pre: 99 %
FVC-%Change-Post: 10 %
FVC-%Pred-Post: 83 %
FVC-%Pred-Pre: 75 %
FVC-Post: 3.98 L
FVC-Pre: 3.6 L
Post FEV1/FVC ratio: 51 %
Post FEV6/FVC ratio: 94 %
Pre FEV1/FVC ratio: 53 %
Pre FEV6/FVC Ratio: 94 %
RV % pred: 97 %
RV: 2.62 L
TLC % pred: 91 %
TLC: 6.97 L

## 2020-07-04 NOTE — Progress Notes (Signed)
[facility]  Chief Complaint  Patient presents with  . New Patient (Initial Visit)    Former [facility] patient.  Get test results.    Constitutional:  BP 124/74 (BP Location: Right Arm, Patient Position: Sitting, Cuff Size: Normal)   Pulse 69   Temp (!) 97 F (36.1 C) (Temporal)   Ht 6\' 1"  (1.854 m)   Wt 189 lb (85.7 kg)   BMI 24.94 kg/m   Past Medical History:  Hypothyroidism, HLD, HTN, GERD, Anxiety  Past Surgical History:  He  has a past surgical history that includes Appendectomy; Nissen fundoplication (1245Y); Cervical spine surgery; Wisdom tooth extraction; Hernia repair (Right, 1965); Back surgery; Foot fracture surgery (Left); Fracture surgery; Nasal septum surgery (1986); Finger amputation (Left, 2004); Thyroidectomy, partial (2000s); Cardiac catheterization (1980s); and LEFT HEART CATH AND CORONARY ANGIOGRAPHY (N/A, 02/21/2018).  Brief Summary:  Russell Brown is a 75 y.o. 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      Subjective:   Previously seen by Dr. Joya Gaskins, Dr. Lake Bells and Dr. Elsworth Soho.  Most recently seen by Wyn Quaker.  He has noticed more trouble with his breathing.  This has progressed over the past 1 year.  He gets occasional cough, wheeze, and congestion.  Uses anoro, and this works better than symbicort.  No hx of CTD.  Denies joint swelling, skin rash, or leg swelling.  No trouble breathing while asleep.  Now having trouble breathing when walking on level ground.   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  He maintained SpO2 > 90% on room air with ambulatory oximetry in office today.  Physical Exam:   Appearance - well kempt   ENMT - no sinus tenderness, no oral exudate, no LAN, Mallampati 2 airway, no stridor, triangular uvula, decreased AP diameter  Respiratory - decreased breath  sounds bilaterally, no wheezing or rales  CV - s1s2 regular rate and rhythm, no murmurs  Ext - no clubbing, no edema  Skin - no rashes  Psych - normal mood and affect   Pulmonary testing:   Spirometry 02/19/16 >> FEV1 2.0 (56%), FEV1% 57  FeNO 02/19/16 >> 21  Chest Imaging:   HRCT chest 05/12/20 >> atherosclerosis, severe centrilobular emphysema, mild interstitial opacity at lung bases (indeterminate for UIP)  Sleep Tests:   PSG 03/25/18 >> AHI 1.1  Cardiac Tests:    Social History:  He  reports that he quit smoking about 23 years ago. His smoking use included cigarettes. He has a 35.00 pack-year smoking history. He has never used smokeless tobacco. He reports current alcohol use of about 7.0 standard drinks of alcohol per week. He reports that he does not use drugs.  Family History:  His family history includes Alzheimer's disease in his paternal uncle; Asthma in his son; Cancer in his father and maternal grandmother; Heart disease in his paternal grandfather and paternal grandmother; Hyperlipidemia in his father; Hypertension in his father; Osteoporosis in his maternal grandmother, mother, and paternal aunt; Parkinsonism in his mother; Pneumonia in his mother; Prostate cancer in his father.     Assessment/Plan:   COPD with emphysema. - tried symbicort before, but lost effectiveness - continue anoro - prn albuterol; discussed indications when he should use this - he reports having PFT done in December but not available in Epic; will check with staff to see if this can be located  Interstitial lung disease. - has mild changes on HRCT chest from November 2021; indeterminate findings for UIP - seems less likely that previous mold exposure caused these findings given time lag and radiographic appearance - will check ANA and rheumatoid factor  Dyspnea on exertion. - will arrange for 6 minute walk test and overnight oximetry on room air - will arrange for Echo to assess RV  function and pulmonary pressures  CAD. - followed by Dr. Kirk Ruths with [facility]  Time Spent Involved in Patient Care on Day of Examination:  37 minutes  Follow up:  Patient Instructions  Lab tests today  Will arrange for 6 minute walk test and overnight oxygen test  Will arrange for Echocardiogram  Follow up in 6 weeks   Medication List:   Allergies as of 07/04/2020      Reactions   Codeine Anxiety   Reaction if taken for extended periods of time.      Medication List       Accurate as of July 04, 2020 11:40 AM. If you have any questions, ask your nurse or doctor.        albuterol 108 (90 Base) MCG/ACT inhaler Commonly known as: ProAir HFA Inhale 2 puffs into the lungs every 6 (six) hours as needed for wheezing or shortness of breath.   Anoro Ellipta 62.5-25 MCG/INH Aepb Generic drug: umeclidinium-vilanterol Inhale 1 puff into the lungs daily.   aspirin EC 81 MG tablet Take 81 mg by mouth daily.   atorvastatin 20 MG tablet Commonly known as: LIPITOR Take 1 tablet (20 mg total) by mouth daily. Pt needs OV for further refills.   dutasteride 0.5 MG capsule Commonly known as: AVODART TAKE 1 CAPSULE BY MOUTH  DAILY   fish oil-omega-3 fatty acids 1000 MG capsule Take 2 g by mouth daily.   levothyroxine 75 MCG tablet Commonly known as: SYNTHROID TAKE 1 TABLET BY MOUTH  DAILY BEFORE BREAKFAST   lisinopril-hydrochlorothiazide 10-12.5 MG tablet Commonly known as: ZESTORETIC Take 1 tablet by mouth daily. Pt needs OV for further refills.   multivitamin with minerals Tabs tablet Take 1 tablet by mouth daily.   nitroGLYCERIN 0.4 MG SL tablet Commonly known as: NITROSTAT Place 1 tablet (0.4 mg total) under the tongue every 5 (five) minutes as needed.   omeprazole 40 MG capsule Commonly known as: PRILOSEC Take 1 capsule (40 mg total) by mouth 2 (two) times daily. Office visit for further refills   sertraline 50 MG tablet Commonly known as:  ZOLOFT Take 1 tablet (50 mg total) by mouth daily.       Signature:  Chesley Mires, MD [facility] Pager - [phone] 07/04/2020, 11:40 AM

## 2020-07-04 NOTE — Patient Instructions (Addendum)
Lab tests today  Will arrange for 6 minute walk test and overnight oxygen test  Will arrange for Echocardiogram  Follow up in 6 weeks

## 2020-07-05 LAB — RHEUMATOID FACTOR: Rheumatoid fact SerPl-aCnc: <14 IU/mL (ref <14)

## 2020-07-06 LAB — ANA W/REFLEX IF POSITIVE: Anti Nuclear Antibody (ANA): NEGATIVE

## 2020-07-09 NOTE — Telephone Encounter (Signed)
Patient sent email about results to PFT, looks by note these were went over in office visit on 07/04/20 and he is still having questions.   Dr. Halford Chessman please advise.   I SAW MY PFT TEST RESULTS BUT DID NOT UNDERSTAND ANY OF IT - 5 YEARS AGO MY LUNG CAPACITY WAS 58% - PLEASE ADVISE WHAT MY LUNG CAPACITY IS CURRENTLY - THANKS MUCH!

## 2020-07-16 ENCOUNTER — Telehealth: Payer: Self-pay | Admitting: Pulmonary Disease

## 2020-07-16 NOTE — Telephone Encounter (Signed)
ONO with RA 07/10/20 >> test time 8 hrs 17 min.  Basal SpO2 92.3%, low SpO2 84%.  Spent 2.1 min with SpO2 < 88%.   Please let him know his overnight oxygen test was okay.  He doesn't need supplemental oxygen at night.

## 2020-07-17 NOTE — Telephone Encounter (Signed)
Called and went over ONO results per Dr Halford Chessman with patient. All questions answered and patient expressed full understanding. Nothing further needed at this time.

## 2020-08-04 ENCOUNTER — Other Ambulatory Visit: Payer: Self-pay

## 2020-08-04 ENCOUNTER — Ambulatory Visit (HOSPITAL_COMMUNITY): Payer: Medicare Other | Attending: Cardiology

## 2020-08-04 DIAGNOSIS — J849 Interstitial pulmonary disease, unspecified: Secondary | ICD-10-CM | POA: Diagnosis not present

## 2020-08-04 DIAGNOSIS — R0609 Other forms of dyspnea: Secondary | ICD-10-CM

## 2020-08-04 DIAGNOSIS — R0600 Dyspnea, unspecified: Secondary | ICD-10-CM | POA: Diagnosis present

## 2020-08-04 DIAGNOSIS — J432 Centrilobular emphysema: Secondary | ICD-10-CM | POA: Diagnosis present

## 2020-08-04 LAB — ECHOCARDIOGRAM COMPLETE
Area-P 1/2: 2 cm2
P 1/2 time: 578 msec
S' Lateral: 2.7 cm

## 2020-08-06 NOTE — Progress Notes (Signed)
Called and went over echo results per Dr Halford Chessman with patient. All questions answered and patient expressed full understanding. Nothing further needed at this time.

## 2020-08-13 ENCOUNTER — Other Ambulatory Visit: Payer: Self-pay | Admitting: Family Medicine

## 2020-08-24 ENCOUNTER — Other Ambulatory Visit: Payer: Self-pay | Admitting: Family Medicine

## 2020-08-24 DIAGNOSIS — R079 Chest pain, unspecified: Secondary | ICD-10-CM

## 2020-08-24 DIAGNOSIS — E785 Hyperlipidemia, unspecified: Secondary | ICD-10-CM

## 2020-09-23 ENCOUNTER — Other Ambulatory Visit: Payer: Self-pay | Admitting: Family Medicine

## 2020-09-23 DIAGNOSIS — I10 Essential (primary) hypertension: Secondary | ICD-10-CM

## 2020-10-07 DIAGNOSIS — D1801 Hemangioma of skin and subcutaneous tissue: Secondary | ICD-10-CM | POA: Insufficient documentation

## 2020-10-07 DIAGNOSIS — L821 Other seborrheic keratosis: Secondary | ICD-10-CM | POA: Insufficient documentation

## 2020-10-07 DIAGNOSIS — L570 Actinic keratosis: Secondary | ICD-10-CM | POA: Insufficient documentation

## 2020-12-04 ENCOUNTER — Telehealth: Payer: Self-pay | Admitting: Pulmonary Disease

## 2020-12-04 NOTE — Telephone Encounter (Signed)
Left message for patient to call back  

## 2020-12-04 NOTE — Telephone Encounter (Signed)
Pt requested appt via mychart stating the following: DR SOOD ADVISED ME 07/04/20 THAT I NEEDED A FOLLOW-UP CT SCAN IN ABOUT SIX MONTHS - ALSO WANT TO KNOW IF I AM A CANDIDATE FOR THE PULMONARY FIBROSIS CLINICAL TRIAL CURRENTLY BEING CONDUCTED BY [facility] - THANKS! Please advise pt on ct scan as well if he qualify for Pulmonary Fibrosis Clinical Trail -  [phone]

## 2020-12-05 NOTE — Telephone Encounter (Signed)
I called the pt and he is aware of VS recs.  He stated that he feels that his breathing is the same as before.  He is aware that he will need the repeat CT  11/22.    Will forward this message to [place] with [employer] so that they may reach out to the pt and discuss with him some research trials that he may qualify for.  Pt is very interested in these.  thanks

## 2020-12-05 NOTE — Telephone Encounter (Signed)
Most of his issues on CT chest from November 2021 were related to emphysema with mild component of ILD.  He doesn't need follow up CT chest until November 2022 unless he is having progressive respiratory symptoms.  If he is having progressive respiratory symptoms, then please order a high resolution CT chest to assess ILD and emphysema.  Can you please see if someone from [facility] can reach out to Russell Brown to discuss research trials he might be a candidate for.

## 2020-12-05 NOTE — Telephone Encounter (Signed)
Called and spoke with patient, advised that I did not see a mention of a CT scan in his last visit back in January of 2022.  I let him know that I would send a message to Dr. Halford Chessman regarding whether he would be a good candidate for the clinical trials with [facility] and once we heard back from him we would call him back.  He verbalized understanding.   Dr. Halford Chessman, Please advise if patient needs a f/u CT scan in October 2022 and whether he would be a good candidate for the clinical trials with [facility].  Thank you.

## 2020-12-11 ENCOUNTER — Other Ambulatory Visit: Payer: Self-pay | Admitting: Family Medicine

## 2020-12-11 DIAGNOSIS — I10 Essential (primary) hypertension: Secondary | ICD-10-CM

## 2020-12-11 MED ORDER — LISINOPRIL-HYDROCHLOROTHIAZIDE 10-12.5 MG PO TABS: 1.0000 | ORAL_TABLET | Freq: Every day | ORAL | 0 refills | Status: DC

## 2020-12-16 ENCOUNTER — Other Ambulatory Visit: Payer: Self-pay | Admitting: Family Medicine

## 2020-12-16 DIAGNOSIS — F4310 Post-traumatic stress disorder, unspecified: Secondary | ICD-10-CM

## 2021-02-10 ENCOUNTER — Other Ambulatory Visit: Payer: Self-pay | Admitting: Family Medicine

## 2021-02-10 DIAGNOSIS — F4310 Post-traumatic stress disorder, unspecified: Secondary | ICD-10-CM

## 2021-02-19 ENCOUNTER — Telehealth: Payer: Self-pay | Admitting: Pulmonary Disease

## 2021-02-19 DIAGNOSIS — J449 Chronic obstructive pulmonary disease, unspecified: Secondary | ICD-10-CM

## 2021-02-19 NOTE — Telephone Encounter (Signed)
Pt sent a message via MyChart stating;  "[place], LUNG FOLLOWUP CT SCAN"  There is no active requests in the pts chart for the requested services and pt was last seen 06/2020.  Pls regard; [phone]

## 2021-02-19 NOTE — Telephone Encounter (Signed)
VS it looks like the pt is due for a CT scan 11/22.  Do you want him to have this and then an OV and CXR?  Please advise. Thanks

## 2021-02-20 NOTE — Telephone Encounter (Signed)
CT Chest order was placed. Pt notified that PCCs would contact him with information regurding the appointment time closer to November, 2022. Nothing further needed at this time.

## 2021-02-20 NOTE — Telephone Encounter (Signed)
He just needs to have CT chest in November 2022 to follow up in ILD.

## 2021-03-17 ENCOUNTER — Ambulatory Visit: Payer: Medicare Other | Admitting: Family Medicine

## 2021-03-19 ENCOUNTER — Other Ambulatory Visit: Payer: Self-pay | Admitting: Family Medicine

## 2021-03-19 DIAGNOSIS — I10 Essential (primary) hypertension: Secondary | ICD-10-CM

## 2021-03-20 NOTE — Telephone Encounter (Signed)
Appointment scheduled 05/04/21. Dm/cma

## 2021-04-09 ENCOUNTER — Other Ambulatory Visit: Payer: Self-pay | Admitting: Family Medicine

## 2021-04-09 ENCOUNTER — Other Ambulatory Visit: Payer: Self-pay | Admitting: Pulmonary Disease

## 2021-04-09 DIAGNOSIS — N401 Enlarged prostate with lower urinary tract symptoms: Secondary | ICD-10-CM

## 2021-04-16 ENCOUNTER — Telehealth (HOSPITAL_BASED_OUTPATIENT_CLINIC_OR_DEPARTMENT_OTHER): Payer: Self-pay

## 2021-04-22 LAB — PSA: PSA: 1.24

## 2021-04-28 ENCOUNTER — Ambulatory Visit: Payer: Medicare Other | Admitting: Family Medicine

## 2021-04-28 ENCOUNTER — Ambulatory Visit (INDEPENDENT_AMBULATORY_CARE_PROVIDER_SITE_OTHER): Payer: Medicare Other

## 2021-04-28 ENCOUNTER — Ambulatory Visit: Payer: Medicare Other

## 2021-04-28 DIAGNOSIS — Z0000 Encounter for general adult medical examination without abnormal findings: Secondary | ICD-10-CM | POA: Diagnosis not present

## 2021-04-28 NOTE — Patient Instructions (Signed)
Russell Brown , Thank you for taking time to come for your Medicare Wellness Visit. I appreciate your ongoing commitment to your health goals. Please review the following plan we discussed and let me know if I can assist you in the future.   Screening recommendations/referrals: Colonoscopy: 05/10/2013 due 2024 Recommended yearly ophthalmology/optometry visit for glaucoma screening and checkup Recommended yearly dental visit for hygiene and checkup  Vaccinations: Influenza vaccine: completed  Pneumococcal vaccine: completed  Tdap vaccine: due with injury  Shingles vaccine: completed     Advanced directives: none   Conditions/risks identified: none  Next appointment: none   Preventive Care 25 Years and Older, Male Preventive care refers to lifestyle choices and visits with your health care provider that can promote health and wellness. What does preventive care include? A yearly physical exam. This is also called an annual well check. Dental exams once or twice a year. Routine eye exams. Ask your health care provider how often you should have your eyes checked. Personal lifestyle choices, including: Daily care of your teeth and gums. Regular physical activity. Eating a healthy diet. Avoiding tobacco and drug use. Limiting alcohol use. Practicing safe sex. Taking low doses of aspirin every day. Taking vitamin and mineral supplements as recommended by your health care provider. What happens during an annual well check? The services and screenings done by your health care provider during your annual well check will depend on your age, overall health, lifestyle risk factors, and family history of disease. Counseling  Your health care provider may ask you questions about your: Alcohol use. Tobacco use. Drug use. Emotional well-being. Home and relationship well-being. Sexual activity. Eating habits. History of falls. Memory and ability to understand (cognition). Work and work  Statistician. Screening  You may have the following tests or measurements: Height, weight, and BMI. Blood pressure. Lipid and cholesterol levels. These may be checked every 5 years, or more frequently if you are over 69 years old. Skin check. Lung cancer screening. You may have this screening every year starting at age 36 if you have a 30-pack-year history of smoking and currently smoke or have quit within the past 15 years. Fecal occult blood test (FOBT) of the stool. You may have this test every year starting at age 57. Flexible sigmoidoscopy or colonoscopy. You may have a sigmoidoscopy every 5 years or a colonoscopy every 10 years starting at age 70. Prostate cancer screening. Recommendations will vary depending on your family history and other risks. Hepatitis C blood test. Hepatitis B blood test. Sexually transmitted disease (STD) testing. Diabetes screening. This is done by checking your blood sugar (glucose) after you have not eaten for a while (fasting). You may have this done every 1-3 years. Abdominal aortic aneurysm (AAA) screening. You may need this if you are a current or former smoker. Osteoporosis. You may be screened starting at age 14 if you are at high risk. Talk with your health care provider about your test results, treatment options, and if necessary, the need for more tests. Vaccines  Your health care provider may recommend certain vaccines, such as: Influenza vaccine. This is recommended every year. Tetanus, diphtheria, and acellular pertussis (Tdap, Td) vaccine. You may need a Td booster every 10 years. Zoster vaccine. You may need this after age 89. Pneumococcal 13-valent conjugate (PCV13) vaccine. One dose is recommended after age 55. Pneumococcal polysaccharide (PPSV23) vaccine. One dose is recommended after age 2. Talk to your health care provider about which screenings and vaccines you need and  how often you need them. This information is not intended to replace  advice given to you by your health care provider. Make sure you discuss any questions you have with your health care provider. Document Released: 07/04/2015 Document Revised: 02/25/2016 Document Reviewed: 04/08/2015 Elsevier Interactive Patient Education  2017 [employer] Prevention in the Home Falls can cause injuries. They can happen to people of all ages. There are many things you can do to make your home safe and to help prevent falls. What can I do on the outside of my home? Regularly fix the edges of walkways and driveways and fix any cracks. Remove anything that might make you trip as you walk through a door, such as a raised step or threshold. Trim any bushes or trees on the path to your home. Use bright outdoor lighting. Clear any walking paths of anything that might make someone trip, such as rocks or tools. Regularly check to see if handrails are loose or broken. Make sure that both sides of any steps have handrails. Any raised decks and porches should have guardrails on the edges. Have any leaves, snow, or ice cleared regularly. Use sand or salt on walking paths during winter. Clean up any spills in your garage right away. This includes oil or grease spills. What can I do in the bathroom? Use night lights. Install grab bars by the toilet and in the tub and shower. Do not use towel bars as grab bars. Use non-skid mats or decals in the tub or shower. If you need to sit down in the shower, use a plastic, non-slip stool. Keep the floor dry. Clean up any water that spills on the floor as soon as it happens. Remove soap buildup in the tub or shower regularly. Attach bath mats securely with double-sided non-slip rug tape. Do not have throw rugs and other things on the floor that can make you trip. What can I do in the bedroom? Use night lights. Make sure that you have a light by your bed that is easy to reach. Do not use any sheets or blankets that are too big for your bed.  They should not hang down onto the floor. Have a firm chair that has side arms. You can use this for support while you get dressed. Do not have throw rugs and other things on the floor that can make you trip. What can I do in the kitchen? Clean up any spills right away. Avoid walking on wet floors. Keep items that you use a lot in easy-to-reach places. If you need to reach something above you, use a strong step stool that has a grab bar. Keep electrical cords out of the way. Do not use floor polish or wax that makes floors slippery. If you must use wax, use non-skid floor wax. Do not have throw rugs and other things on the floor that can make you trip. What can I do with my stairs? Do not leave any items on the stairs. Make sure that there are handrails on both sides of the stairs and use them. Fix handrails that are broken or loose. Make sure that handrails are as long as the stairways. Check any carpeting to make sure that it is firmly attached to the stairs. Fix any carpet that is loose or worn. Avoid having throw rugs at the top or bottom of the stairs. If you do have throw rugs, attach them to the floor with carpet tape. Make sure that you have  a light switch at the top of the stairs and the bottom of the stairs. If you do not have them, ask someone to add them for you. What else can I do to help prevent falls? Wear shoes that: Do not have high heels. Have rubber bottoms. Are comfortable and fit you well. Are closed at the toe. Do not wear sandals. If you use a stepladder: Make sure that it is fully opened. Do not climb a closed stepladder. Make sure that both sides of the stepladder are locked into place. Ask someone to hold it for you, if possible. Clearly mark and make sure that you can see: Any grab bars or handrails. First and last steps. Where the edge of each step is. Use tools that help you move around (mobility aids) if they are needed. These  include: Canes. Walkers. Scooters. Crutches. Turn on the lights when you go into a dark area. Replace any light bulbs as soon as they burn out. Set up your furniture so you have a clear path. Avoid moving your furniture around. If any of your floors are uneven, fix them. If there are any pets around you, be aware of where they are. Review your medicines with your doctor. Some medicines can make you feel dizzy. This can increase your chance of falling. Ask your doctor what other things that you can do to help prevent falls. This information is not intended to replace advice given to you by your health care provider. Make sure you discuss any questions you have with your health care provider. Document Released: 04/03/2009 Document Revised: 11/13/2015 Document Reviewed: 07/12/2014 Elsevier Interactive Patient Education  2017 [employer].

## 2021-04-28 NOTE — Progress Notes (Signed)
Subjective:   Russell Brown is a 75 y.o. male who presents for an subsequent Medicare Annual Wellness Visit.  I connected with Donnamae Jude today by telephone and verified that I am speaking with the correct person using two identifiers. Location patient: home Location provider: work Persons participating in the virtual visit: patient, provider.   I discussed the limitations, risks, security and privacy concerns of performing an evaluation and management service by telephone and the availability of in person appointments. I also discussed with the patient that there may be a patient responsible charge related to this service. The patient expressed understanding and verbally consented to this telephonic visit.    Interactive audio and video telecommunications were attempted between this provider and patient, however failed, due to patient having technical difficulties OR patient did not have access to video capability.  We continued and completed visit with audio only.    Review of Systems     Cardiac Risk Factors include: advanced age (>35men, >12 women);dyslipidemia;hypertension;male gender     Objective:    Today's Vitals   There is no height or weight on file to calculate BMI.  Advanced Directives 04/28/2021 04/24/2020 04/26/2018 03/25/2018 02/20/2018 02/20/2018 01/02/2016  Does Patient Have a Medical Advance Directive? Yes Yes No Yes Yes Yes Yes  Type of Paramedic of [place];Living will [name];Living will - [place];Living will [place];Living will - [place];Living will  Does patient want to make changes to medical advance directive? - - - - No - Patient declined No - Patient declined Yes - information given  Copy of [place] in Chart? No - copy requested No - copy requested - No - copy requested No - copy requested - No - copy requested    Current  Medications (verified) Outpatient Encounter Medications as of 04/28/2021  Medication Sig   albuterol (PROAIR HFA) 108 (90 Base) MCG/ACT inhaler Inhale 2 puffs into the lungs every 6 (six) hours as needed for wheezing or shortness of breath.   ANORO ELLIPTA 62.5-25 MCG/ACT AEPB USE 1 INHALATION BY MOUTH  DAILY   aspirin EC 81 MG tablet Take 81 mg by mouth daily.   atorvastatin (LIPITOR) 20 MG tablet TAKE 1 TABLET BY MOUTH  DAILY   dutasteride (AVODART) 0.5 MG capsule TAKE 1 CAPSULE BY MOUTH  DAILY   fish oil-omega-3 fatty acids 1000 MG capsule Take 2 g by mouth daily.    levothyroxine (SYNTHROID) 75 MCG tablet TAKE 1 TABLET BY MOUTH  DAILY BEFORE BREAKFAST   lisinopril-hydrochlorothiazide (ZESTORETIC) 10-12.5 MG tablet TAKE 1 TABLET BY MOUTH  DAILY   Multiple Vitamin (MULTIVITAMIN WITH MINERALS) TABS tablet Take 1 tablet by mouth daily.   nitroGLYCERIN (NITROSTAT) 0.4 MG SL tablet PLACE 1 TABLET UNDER THE TONGUE EVERY 5 MINUTES AS NEEDED   omeprazole (PRILOSEC) 40 MG capsule TAKE 1 CAPSULE BY MOUTH  TWICE DAILY   sertraline (ZOLOFT) 50 MG tablet TAKE 1 TABLET BY MOUTH  DAILY   No facility-administered encounter medications on file as of 04/28/2021.    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   COPD (chronic obstructive pulmonary disease) (HCC)    GERD (gastroesophageal reflux disease)    History of hiatal hernia    Hyperlipemia  Hypertension    Hypothyroidism    Pneumonia 2013; 2017   PTSD (post-traumatic stress disorder)    retired Engineer, structural    Sleep apnea    mild - but does not wear a c-pap per pt   Thyroid disease    thyroid nodules - half of thyroid was removed per pt   Past Surgical History:  Procedure Laterality Date   APPENDECTOMY     BACK SURGERY     CARDIAC CATHETERIZATION  1980s   "no blockage at that  time" (02/20/2018)   CERVICAL SPINE SURGERY     "enlarged hole that nerves went thru"   FINGER AMPUTATION Left 2004   2nd and 3rd digits; "table saw injury"   FOOT FRACTURE SURGERY Left    "heel OR; put 2 steel rods in and took them out 6 wks later"   [place] CATH AND CORONARY ANGIOGRAPHY N/A 02/21/2018   Procedure: LEFT HEART CATH AND CORONARY ANGIOGRAPHY;  Surgeon: Troy Sine, MD;  Location: [facility];  Service: Cardiovascular;  Laterality: N/A;   NASAL SEPTUM SURGERY  7948   NISSEN FUNDOPLICATION  0165V   "w/hernia repair"   THYROIDECTOMY, PARTIAL  2000s   WISDOM TOOTH EXTRACTION     Family History  Problem Relation Age of Onset   Parkinsonism Mother    Osteoporosis Mother    Pneumonia Mother        60   Cancer Father        prostate,  skin   Hyperlipidemia Father    Hypertension Father    Prostate cancer Father    Asthma Son    Osteoporosis Paternal Aunt    Alzheimer's disease Paternal Uncle    Osteoporosis Maternal Grandmother    Cancer Maternal Grandmother        lung   Heart disease Paternal Grandmother        MI   Heart disease Paternal Grandfather        MI   Colon cancer Neg Hx    Pancreatic cancer Neg Hx    Stomach cancer Neg Hx    Esophageal cancer Neg Hx    Liver cancer Neg Hx    Rectal cancer Neg Hx    Social History   Socioeconomic History   Marital status: Married    Spouse name: Not on file   Number of children: 4   Years of education: Not on file   Highest education level: Not on file  Occupational History   Occupation: retired Warden/ranger    Employer: [employer]  Tobacco Use   Smoking status: Former    Packs/day: 1.00    Years: 35.00    Pack years: 35.00    Types: Cigarettes    Quit date: 08/18/1996    Years since quitting: 24.7   Smokeless tobacco: Never  Vaping Use   Vaping Use: Never used  Substance and Sexual Activity   Alcohol use: Yes    Alcohol/week: 7.0  standard drinks    Types: 7 Cans of beer per week   Drug use: Never   Sexual activity: Not Currently    Partners: Female  Other Topics Concern   Not on file  Social History Narrative   Exercise- weights and walk on treadmill   Social Determinants of Health   Financial Resource Strain: Low Risk    Difficulty of Paying Living Expenses: Not hard at all  Food  Insecurity: No Food Insecurity   Worried About Charity fundraiser in the Last Year: Never true   Ran Out of Food in the Last Year: Never true  Transportation Needs: No Transportation Needs   Lack of Transportation (Medical): No   Lack of Transportation (Non-Medical): No  Physical Activity: Insufficiently Active   Days of Exercise per Week: 2 days   Minutes of Exercise per Session: 60 min  Stress: No Stress Concern Present   Feeling of Stress : Not at all  Social Connections: Moderately Integrated   Frequency of Communication with Friends and Family: Twice a week   Frequency of Social Gatherings with Friends and Family: Twice a week   Attends Religious Services: 1 to 4 times per year   Active Member of [employer] or Organizations: No   Attends Music therapist: Never   Marital Status: Married    Tobacco Counseling Counseling given: Not Answered   Clinical Intake:  Pre-visit preparation completed: Yes  Pain : No/denies pain     Nutritional Risks: None  How often do you need to have someone help you when you read instructions, pamphlets, or other written materials from your doctor or pharmacy?: 1 - Never What is the last grade level you completed in school?: college  Diabetic?no   Interpreter Needed?: No  Information entered by :: [place] of Daily Living In your present state of health, do you have any difficulty performing the following activities: 04/28/2021  Hearing? N  Vision? N  Difficulty concentrating or making decisions? N  Walking or climbing stairs? N  Dressing or  bathing? N  Doing errands, shopping? N  Preparing Food and eating ? N  Using the Toilet? N  In the past six months, have you accidently leaked urine? N  Do you have problems with loss of bowel control? N  Managing your Medications? N  Managing your Finances? N  Housekeeping or managing your Housekeeping? N  Some recent data might be hidden    Patient Care Team: Libby Maw, MD as PCP - General (Family Medicine) Festus Aloe, MD as Consulting Physician (Urology) Juanito Doom, MD as Consulting Physician (Pulmonary Disease) Nahser, Wonda Cheng, MD as Consulting Physician (Cardiology)  Indicate any recent Medical Services you may have received from other than [facility] providers in the past year (date may be approximate).     Assessment:   This is a routine wellness examination for [place].  Hearing/Vision screen Vision Screening - Comments:: Annual eye exams wears contacts   Dietary issues and exercise activities discussed: Current Exercise Habits: Home exercise routine, Type of exercise: walking, Time (Minutes): 30, Frequency (Times/Week): 3, Weekly Exercise (Minutes/Week): 90, Intensity: Mild, Exercise limited by: respiratory conditions(s)   Goals Addressed             This Visit's Progress    Patient Stated   On track    Increase activity       Depression Screen PHQ 2/9 Scores 04/28/2021 04/28/2021 04/24/2020 03/17/2020 01/12/2018 12/13/2017 07/02/2016  PHQ - 2 Score 0 0 0 0 0 0 0  PHQ- 9 Score - - - - 2 - -    Fall Risk Fall Risk  04/28/2021 04/24/2020 03/17/2020 12/13/2017 07/02/2016  Falls in the past year? 0 0 0 No No  Number falls in past yr: 0 0 0 - -  Injury with Fall? 0 0 0 - -  Follow up Falls evaluation completed Falls prevention discussed - - -  FALL RISK PREVENTION PERTAINING TO THE HOME:  Any stairs in or around the home? Yes  If so, are there any without handrails? No  Home free of loose throw rugs in walkways, pet beds, electrical cords,  etc? Yes  Adequate lighting in your home to reduce risk of falls? Yes   ASSISTIVE DEVICES UTILIZED TO PREVENT FALLS:  Life alert? No  Use of a cane, walker or w/c? No  Grab bars in the bathroom? No  Shower chair or bench in shower? No  Elevated toilet seat or a handicapped toilet? No    Cognitive Function:    Normal cognitive status assessed by direct observation by this Nurse Health Advisor. No abnormalities found.  \    Immunizations Immunization History  Administered Date(s) Administered   Fluad Quad(high Dose 65+) 03/16/2019   H1N1 05/28/2008   Influenza Whole 04/28/2007, 03/26/2008, 04/16/2009, 05/07/2010, 04/10/2011, 04/13/2012   Influenza, High Dose Seasonal PF 03/05/2014, 03/17/2015, 03/17/2018, 03/26/2019   Influenza, Seasonal, Injecte, Preservative Fre 03/06/2013   Influenza,inj,Quad PF,6+ Mos 03/04/2016   Influenza-Unspecified 03/22/2011, 03/11/2020   Moderna SARS-COV2 Booster Vaccination 05/05/2020   Moderna Sars-Covid-2 Vaccination 08/06/2019, 09/04/2019   Pneumococcal Conjugate-13 12/05/2014   Pneumococcal Polysaccharide-23 04/11/2008, 04/18/2013, 03/16/2019   Td 05/22/2009, 02/16/2011   Zoster Recombinat (Shingrix) 03/21/2017, 09/10/2017   Zoster, Live 05/22/2009    TDAP status: Due, Education has been provided regarding the importance of this vaccine. Advised may receive this vaccine at local pharmacy or Health Dept. Aware to provide a copy of the vaccination record if obtained from local pharmacy or Health Dept. Verbalized acceptance and understanding.  Flu Vaccine status: Up to date  Pneumococcal vaccine status: Up to date  Covid-19 vaccine status: Completed vaccines  Qualifies for Shingles Vaccine? Yes   Zostavax completed Yes   Shingrix Completed?: Yes  Screening Tests Health Maintenance  Topic Date Due   COVID-19 Vaccine (3 - Moderna risk series) 06/02/2020   INFLUENZA VACCINE  01/19/2021   TETANUS/TDAP  02/15/2021   COLONOSCOPY (Pts  45-21yrs Insurance coverage will need to be confirmed)  05/11/2023   Pneumonia Vaccine 2+ Years old  Completed   Hepatitis C Screening  Completed   Zoster Vaccines- Shingrix  Completed   HPV VACCINES  Aged Out    Health Maintenance  Health Maintenance Due  Topic Date Due   COVID-19 Vaccine (3 - Moderna risk series) 06/02/2020   INFLUENZA VACCINE  01/19/2021   TETANUS/TDAP  02/15/2021    Colorectal cancer screening: No longer required.   Lung Cancer Screening: (Low Dose CT Chest recommended if Age 24-80 years, 30 pack-year currently smoking OR have quit w/in 15years.) does not qualify.   Lung Cancer Screening Referral: n/a  Additional Screening:  Hepatitis C Screening: does not qualify; Completed 12/06/2016  Vision Screening: Recommended annual ophthalmology exams for early detection of glaucoma and other disorders of the eye. Is the patient up to date with their annual eye exam?  Yes  Who is the provider or what is the name of the office in which the patient attends annual eye exams? Dr.Prez  If pt is not established with a provider, would they like to be referred to a provider to establish care? No .   Dental Screening: Recommended annual dental exams fo r proper oral hygiene  Community Resource Referral / Chronic Care Management: CRR required this visit?  No   CCM required this visit?  No      Plan:     I have personally reviewed and  noted the following in the patient's chart:   Medical and social history Use of alcohol, tobacco or illicit drugs  Current medications and supplements including opioid prescriptions. Patient is not currently taking opioid prescriptions. Functional ability and status Nutritional status Physical activity Advanced directives List of other physicians Hospitalizations, surgeries, and ER visits in previous 12 months Vitals Screenings to include cognitive, depression, and falls Referrals and appointments  In addition, I have  reviewed and discussed with patient certain preventive protocols, quality metrics, and best practice recommendations. A written personalized care plan for preventive services as well as general preventive health recommendations were provided to patient.     Randel Pigg, LPN   [id number]   Nurse Notes: none

## 2021-04-30 ENCOUNTER — Ambulatory Visit (HOSPITAL_BASED_OUTPATIENT_CLINIC_OR_DEPARTMENT_OTHER)
Admission: RE | Admit: 2021-04-30 | Discharge: 2021-04-30 | Disposition: A | Discharge status: Home / Self Care | Payer: Medicare Other | Source: Ambulatory Visit | Attending: Pulmonary Disease | Admitting: Pulmonary Disease

## 2021-04-30 ENCOUNTER — Other Ambulatory Visit: Payer: Self-pay

## 2021-04-30 DIAGNOSIS — J449 Chronic obstructive pulmonary disease, unspecified: Secondary | ICD-10-CM | POA: Diagnosis present

## 2021-05-04 ENCOUNTER — Other Ambulatory Visit: Payer: Self-pay

## 2021-05-04 ENCOUNTER — Encounter: Payer: Self-pay | Admitting: Family Medicine

## 2021-05-04 ENCOUNTER — Ambulatory Visit (INDEPENDENT_AMBULATORY_CARE_PROVIDER_SITE_OTHER): Payer: Medicare Other | Admitting: Family Medicine

## 2021-05-04 VITALS — BP 118/56 | HR 57 | Temp 97.3°F | Ht 73.0 in | Wt 185.6 lb

## 2021-05-04 DIAGNOSIS — E785 Hyperlipidemia, unspecified: Secondary | ICD-10-CM | POA: Diagnosis not present

## 2021-05-04 DIAGNOSIS — E039 Hypothyroidism, unspecified: Secondary | ICD-10-CM

## 2021-05-04 DIAGNOSIS — I10 Essential (primary) hypertension: Secondary | ICD-10-CM

## 2021-05-04 DIAGNOSIS — K760 Fatty (change of) liver, not elsewhere classified: Secondary | ICD-10-CM

## 2021-05-04 DIAGNOSIS — I739 Peripheral vascular disease, unspecified: Secondary | ICD-10-CM

## 2021-05-04 NOTE — Progress Notes (Signed)
Established Patient Office Visit  Subjective:  Patient ID: Russell Brown, male    DOB: May 30, 1946  Age: 75 y.o. MRN: [id number]  CC:  Chief Complaint  Patient presents with   Follow-up    F/u meds and refills.   Not fasting today.       HPI Russell Brown presents for follow-up of hypertension, hyperlipidemia and hypothyroidism.  Diagnosed with pulmonary fibrosis over this past year.  Longstanding history of GERD and asthma as a kid with multiple hospitalizations as a child.  Quit smoking many years ago.  Recent CT of his chest for follow-up of pulmonary fibrosis demonstrated calcifications of his aorta, other vessels as well as his coronary arteries.  History of MI in the past.  Denies any chest pain shortness of breath.  He does have some DOE when walking up an incline.  Unable to go hiking in the mountains like he used to.  He can walk on flat surfaces.  Above CT scan also demonstrated fatty liver disease.  As above does admit to consuming 1-2 alcoholic drinks daily.  Continue Zoloft for anxiety.  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   COPD (chronic obstructive pulmonary disease) (HCC)    GERD (gastroesophageal reflux disease)    History of hiatal hernia    Hyperlipemia    Hypertension    Hypothyroidism    Pneumonia 2013; 2017   PTSD (post-traumatic stress disorder)    retired Engineer, structural    Pulmonary fibrosis ([place])    Sleep apnea    mild - but does not wear a c-pap per pt   Thyroid disease    thyroid nodules - half of thyroid was removed per pt    Past Surgical History:  Procedure Laterality Date   APPENDECTOMY     BACK SURGERY     CARDIAC CATHETERIZATION  1980s   "no blockage at that time" (02/20/2018)   CERVICAL SPINE SURGERY     "enlarged hole that nerves went thru"   FINGER AMPUTATION Left 2004   2nd and 3rd digits; "table saw injury"    FOOT FRACTURE SURGERY Left    "heel OR; put 2 steel rods in and took them out 6 wks later"   [place] CATH AND CORONARY ANGIOGRAPHY N/A 02/21/2018   Procedure: LEFT HEART CATH AND CORONARY ANGIOGRAPHY;  Surgeon: Troy Sine, MD;  Location: [facility];  Service: Cardiovascular;  Laterality: N/A;   NASAL SEPTUM SURGERY  9937   NISSEN FUNDOPLICATION  1696V   "w/hernia repair"   THYROIDECTOMY, PARTIAL  2000s   WISDOM TOOTH EXTRACTION      Family History  Problem Relation Age of Onset   Parkinsonism Mother    Osteoporosis Mother    Pneumonia Mother        48   Cancer Father        prostate,  skin   Hyperlipidemia Father    Hypertension Father    Prostate cancer Father    Asthma Son    Osteoporosis Paternal Aunt    Alzheimer's disease Paternal Uncle    Osteoporosis Maternal Grandmother    Cancer Maternal Grandmother        lung   Heart disease Paternal Grandmother  MI   Heart disease Paternal Grandfather        MI   Colon cancer Neg Hx    Pancreatic cancer Neg Hx    Stomach cancer Neg Hx    Esophageal cancer Neg Hx    Liver cancer Neg Hx    Rectal cancer Neg Hx     Social History   Socioeconomic History   Marital status: Married    Spouse name: Not on file   Number of children: 4   Years of education: Not on file   Highest education level: Not on file  Occupational History   Occupation: retired Warden/ranger    Employer: [employer]  Tobacco Use   Smoking status: Former    Packs/day: 1.00    Years: 35.00    Pack years: 35.00    Types: Cigarettes    Quit date: 08/18/1996    Years since quitting: 24.7   Smokeless tobacco: Never  Vaping Use   Vaping Use: Never used  Substance and Sexual Activity   Alcohol use: Yes    Alcohol/week: 7.0 standard drinks    Types: 7 Cans of beer per week   Drug use: Never   Sexual activity: Not Currently    Partners: Female  Other Topics Concern   Not  on file  Social History Narrative   Exercise- weights and walk on treadmill   Social Determinants of Health   Financial Resource Strain: Low Risk    Difficulty of Paying Living Expenses: Not hard at all  Food Insecurity: No Food Insecurity   Worried About Charity fundraiser in the Last Year: Never true   Ran Out of Food in the Last Year: Never true  Transportation Needs: No Transportation Needs   Lack of Transportation (Medical): No   Lack of Transportation (Non-Medical): No  Physical Activity: Insufficiently Active   Days of Exercise per Week: 2 days   Minutes of Exercise per Session: 60 min  Stress: No Stress Concern Present   Feeling of Stress : Not at all  Social Connections: Moderately Integrated   Frequency of Communication with Friends and Family: Twice a week   Frequency of Social Gatherings with Friends and Family: Twice a week   Attends Religious Services: 1 to 4 times per year   Active Member of [employer] or Organizations: No   Attends Music therapist: Never   Marital Status: Married  Human resources officer Violence: Not At Risk   Fear of Current or Ex-Partner: No   Emotionally Abused: No   Physically Abused: No   Sexually Abused: No    Outpatient Medications Prior to Visit  Medication Sig Dispense Refill   albuterol (PROAIR HFA) 108 (90 Base) MCG/ACT inhaler Inhale 2 puffs into the lungs every 6 (six) hours as needed for wheezing or shortness of breath. 18 g 1   ANORO ELLIPTA 62.5-25 MCG/ACT AEPB USE 1 INHALATION BY MOUTH  DAILY 180 each 3   aspirin EC 81 MG tablet Take 81 mg by mouth daily.     atorvastatin (LIPITOR) 20 MG tablet TAKE 1 TABLET BY MOUTH  DAILY 90 tablet 3   dutasteride (AVODART) 0.5 MG capsule TAKE 1 CAPSULE BY MOUTH  DAILY 90 capsule 0   fish oil-omega-3 fatty acids 1000 MG capsule Take 2 g by mouth daily.      levothyroxine (SYNTHROID) 75 MCG tablet TAKE 1 TABLET BY MOUTH  DAILY BEFORE BREAKFAST 90 tablet 3   lisinopril-hydrochlorothiazide  (ZESTORETIC) 10-12.5 MG tablet  TAKE 1 TABLET BY MOUTH  DAILY 90 tablet 0   Multiple Vitamin (MULTIVITAMIN WITH MINERALS) TABS tablet Take 1 tablet by mouth daily.     nitroGLYCERIN (NITROSTAT) 0.4 MG SL tablet PLACE 1 TABLET UNDER THE TONGUE EVERY 5 MINUTES AS NEEDED 25 tablet 3   omeprazole (PRILOSEC) 40 MG capsule TAKE 1 CAPSULE BY MOUTH  TWICE DAILY 180 capsule 3   sertraline (ZOLOFT) 50 MG tablet TAKE 1 TABLET BY MOUTH  DAILY 30 tablet 1   No facility-administered medications prior to visit.    Allergies  Allergen Reactions   Codeine Anxiety    Reaction if taken for extended periods of time.    ROS Review of Systems  Constitutional:  Negative for diaphoresis, fatigue, fever and unexpected weight change.  HENT: Negative.    Eyes:  Negative for photophobia and visual disturbance.  Respiratory:  Positive for shortness of breath (DOE). Negative for chest tightness.   Cardiovascular:  Negative for chest pain, palpitations and leg swelling.  Gastrointestinal: Negative.   Endocrine: Negative for polyphagia and polyuria.  Genitourinary:  Negative for difficulty urinating, frequency and urgency.  Musculoskeletal:  Negative for gait problem and joint swelling.  Neurological:  Negative for facial asymmetry, speech difficulty and weakness.  Hematological:  Does not bruise/bleed easily.  Psychiatric/Behavioral: Negative.       Objective:    Physical Exam Vitals and nursing note reviewed.  Constitutional:      General: He is not in acute distress.    Appearance: Normal appearance. He is not ill-appearing, toxic-appearing or diaphoretic.  HENT:     Head: Normocephalic and atraumatic.     Right Ear: Tympanic membrane, ear canal and external ear normal.     Left Ear: Tympanic membrane, ear canal and external ear normal.     Mouth/Throat:     Mouth: Mucous membranes are moist.     Pharynx: Oropharynx is clear. No oropharyngeal exudate or posterior oropharyngeal erythema.  Eyes:      General: No scleral icterus.       Right eye: No discharge.        Left eye: No discharge.     Extraocular Movements: Extraocular movements intact.     Conjunctiva/sclera: Conjunctivae normal.     Pupils: Pupils are equal, round, and reactive to light.  Neck:     Vascular: No carotid bruit.  Cardiovascular:     Rate and Rhythm: Normal rate and regular rhythm. Occasional Extrasystoles are present. Pulmonary:     Effort: Pulmonary effort is normal.     Breath sounds: Decreased air movement present. Examination of the right-upper field reveals decreased breath sounds. Examination of the left-upper field reveals decreased breath sounds. Examination of the right-middle field reveals decreased breath sounds. Examination of the left-middle field reveals decreased breath sounds. Examination of the right-lower field reveals decreased breath sounds. Examination of the left-lower field reveals decreased breath sounds. Decreased breath sounds present.  Abdominal:     General: Bowel sounds are normal. There is no distension.     Palpations: There is no mass.     Tenderness: There is no abdominal tenderness. There is no guarding or rebound.     Hernia: No hernia is present.  Musculoskeletal:     Cervical back: No rigidity or tenderness.  Lymphadenopathy:     Cervical: No cervical adenopathy.  Skin:    General: Skin is warm and dry.  Neurological:     Mental Status: He is alert and oriented to person, place,  and time.  Psychiatric:        Mood and Affect: Mood normal.        Behavior: Behavior normal.    BP (!) 118/56   Pulse (!) 57   Temp (!) 97.3 F (36.3 C) (Temporal)   Ht 6\' 1"  (1.854 m)   Wt 185 lb 9.6 oz (84.2 kg)   SpO2 98%   BMI 24.49 kg/m  Wt Readings from Last 3 Encounters:  05/04/21 185 lb 9.6 oz (84.2 kg)  07/04/20 189 lb (85.7 kg)  06/25/20 189 lb 12.8 oz (86.1 kg)     Health Maintenance Due  Topic Date Due   COVID-19 Vaccine (3 - Moderna risk series) 06/02/2020    TETANUS/TDAP  02/15/2021    There are no preventive care reminders to display for this patient.  Lab Results  Component Value Date   TSH 1.67 03/17/2020   Lab Results  Component Value Date   WBC 7.1 03/17/2020   HGB 15.3 03/17/2020   HCT 46.3 03/17/2020   MCV 89.0 03/17/2020   PLT 196.0 03/17/2020   Lab Results  Component Value Date   NA 141 03/17/2020   K 3.5 03/17/2020   CO2 27 03/17/2020   GLUCOSE 113 (H) 03/17/2020   BUN 15 03/17/2020   CREATININE 1.11 03/17/2020   BILITOT 0.8 03/17/2020   ALKPHOS 64 03/17/2020   AST 21 03/17/2020   ALT 26 03/17/2020   PROT 6.3 03/17/2020   ALBUMIN 4.3 03/17/2020   CALCIUM 9.0 03/17/2020   ANIONGAP 10 04/26/2018   GFR 64.71 03/17/2020   Lab Results  Component Value Date   CHOL 132 03/17/2020   Lab Results  Component Value Date   HDL 40.20 03/17/2020   Lab Results  Component Value Date   LDLCALC 61 03/17/2020   Lab Results  Component Value Date   TRIG 157.0 (H) 03/17/2020   Lab Results  Component Value Date   CHOLHDL 3 03/17/2020   Lab Results  Component Value Date   HGBA1C 5.6 02/20/2018      Assessment & Plan:   Problem List Items Addressed This Visit       Cardiovascular and Mediastinum   Essential hypertension   Relevant Orders   Basic metabolic panel   CBC   Urinalysis, Routine w reflex microscopic   PVD (peripheral vascular disease) ([place])   Relevant Orders   Lipid panel     Endocrine   Hypothyroidism   Relevant Orders   TSH     Other   Hyperlipidemia - Primary   Relevant Orders   Basic metabolic panel   Hepatic function panel   Other Visit Diagnoses     Hepatic steatosis       Relevant Orders   Hepatic function panel   Lipid panel       No orders of the defined types were placed in this encounter.   Follow-up: Return in about 6 months (around 11/01/2021).  Will return fasting for above ordered blood work.  He does have peripheral vascular disease.  Advised the importance of  maintaining his blood pressure and treating elevated cholesterol.  Treatment of fatty liver disease would be weight loss, moderation of alcohol intake, and again treatment of cholesterol.  Information was given on fatty liver disease as well as peripheral vascular disease.  Libby Maw, MD

## 2021-05-06 ENCOUNTER — Other Ambulatory Visit: Payer: Self-pay

## 2021-05-06 ENCOUNTER — Other Ambulatory Visit (INDEPENDENT_AMBULATORY_CARE_PROVIDER_SITE_OTHER): Payer: Medicare Other

## 2021-05-06 DIAGNOSIS — I10 Essential (primary) hypertension: Secondary | ICD-10-CM | POA: Diagnosis not present

## 2021-05-06 DIAGNOSIS — I739 Peripheral vascular disease, unspecified: Secondary | ICD-10-CM | POA: Diagnosis not present

## 2021-05-06 DIAGNOSIS — E039 Hypothyroidism, unspecified: Secondary | ICD-10-CM | POA: Diagnosis not present

## 2021-05-06 DIAGNOSIS — K760 Fatty (change of) liver, not elsewhere classified: Secondary | ICD-10-CM | POA: Diagnosis not present

## 2021-05-06 DIAGNOSIS — E785 Hyperlipidemia, unspecified: Secondary | ICD-10-CM | POA: Diagnosis not present

## 2021-05-06 LAB — URINALYSIS, ROUTINE W REFLEX MICROSCOPIC
Bilirubin Urine: NEGATIVE
Hgb urine dipstick: NEGATIVE
Ketones, ur: NEGATIVE
Leukocytes,Ua: NEGATIVE
Nitrite: NEGATIVE
RBC / HPF: NONE SEEN (ref 0–?)
Specific Gravity, Urine: 1.01 (ref 1.000–1.030)
Total Protein, Urine: NEGATIVE
Urine Glucose: NEGATIVE
Urobilinogen, UA: 0.2 (ref 0.0–1.0)
WBC, UA: NONE SEEN (ref 0–?)
pH: 6 (ref 5.0–8.0)

## 2021-05-06 LAB — CBC
HCT: 47 % (ref 39.0–52.0)
Hemoglobin: 15.6 g/dL (ref 13.0–17.0)
MCHC: 33.1 g/dL (ref 30.0–36.0)
MCV: 90.4 fl (ref 78.0–100.0)
Platelets: 215 10*3/uL (ref 150.0–400.0)
RBC: 5.2 Mil/uL (ref 4.22–5.81)
RDW: 13.6 % (ref 11.5–15.5)
WBC: 7.2 10*3/uL (ref 4.0–10.5)

## 2021-05-06 LAB — BASIC METABOLIC PANEL
BUN: 14 mg/dL (ref 6–23)
CO2: 27 mEq/L (ref 19–32)
Calcium: 9.1 mg/dL (ref 8.4–10.5)
Chloride: 103 mEq/L (ref 96–112)
Creatinine, Ser: 1.08 mg/dL (ref 0.40–1.50)
GFR: 67.18 mL/min (ref >60.00)
Glucose, Bld: 104 mg/dL — ABNORMAL HIGH (ref 70–99)
Potassium: 3.9 mEq/L (ref 3.5–5.1)
Sodium: 140 mEq/L (ref 135–145)

## 2021-05-06 LAB — HEPATIC FUNCTION PANEL
ALT: 24 U/L (ref 0–53)
AST: 19 U/L (ref 0–37)
Albumin: 4.5 g/dL (ref 3.5–5.2)
Alkaline Phosphatase: 70 U/L (ref 39–117)
Bilirubin, Direct: 0.1 mg/dL (ref 0.0–0.3)
Total Bilirubin: 0.8 mg/dL (ref 0.2–1.2)
Total Protein: 6.5 g/dL (ref 6.0–8.3)

## 2021-05-06 LAB — LIPID PANEL
Cholesterol: 134 mg/dL (ref 0–200)
HDL: 46.5 mg/dL (ref >39.00)
LDL Cholesterol: 55 mg/dL (ref 0–99)
NonHDL: 87.54
Total CHOL/HDL Ratio: 3
Triglycerides: 161 mg/dL — ABNORMAL HIGH (ref 0.0–149.0)
VLDL: 32.2 mg/dL (ref 0.0–40.0)

## 2021-05-06 LAB — TSH: TSH: 2.47 u[IU]/mL (ref 0.35–5.50)

## 2021-05-06 NOTE — Progress Notes (Signed)
Per the orders of Dr.Kremer pt is here for labs pt tolerated draw well. Pt was able to leave adequate amount of urine for urine sample.

## 2021-05-20 ENCOUNTER — Encounter: Payer: Self-pay | Admitting: Pulmonary Disease

## 2021-05-20 ENCOUNTER — Other Ambulatory Visit: Payer: Self-pay

## 2021-05-20 ENCOUNTER — Ambulatory Visit (INDEPENDENT_AMBULATORY_CARE_PROVIDER_SITE_OTHER): Payer: Medicare Other | Admitting: Pulmonary Disease

## 2021-05-20 VITALS — BP 126/70 | HR 79 | Temp 97.4°F | Ht 73.0 in | Wt 186.2 lb

## 2021-05-20 DIAGNOSIS — J849 Interstitial pulmonary disease, unspecified: Secondary | ICD-10-CM | POA: Diagnosis not present

## 2021-05-20 DIAGNOSIS — J432 Centrilobular emphysema: Secondary | ICD-10-CM | POA: Diagnosis not present

## 2021-05-20 NOTE — Patient Instructions (Signed)
Will arrange for pulmonary function test  Will arrange for appointment with Dr. Chase Caller or Dr. Vaughan Browner in ILD clinic to discuss treatment and research options for pulmonary fibrosis  Follow up with Dr. Halford Chessman in 4 months

## 2021-05-20 NOTE — Progress Notes (Signed)
[name]  Chief Complaint  Patient presents with   Follow-up    Pt states here for CT results     Constitutional:  BP 126/70 (BP Location: Right Arm, Patient Position: Sitting, Cuff Size: Normal)   Pulse 79   Temp (!) 97.4 F (36.3 C) (Oral)   Ht 6\' 1"  (1.854 m)   Wt 186 lb 3.2 oz (84.5 kg)   SpO2 95%   BMI 24.57 kg/m   Past Medical History:  Hypothyroidism, HLD, HTN, GERD s/p Nissen Fundoplication in 3007, Anxiety, PTSD  Past Surgical History:  He  has a past surgical history that includes Appendectomy; Nissen fundoplication (6226J); Cervical spine surgery; Wisdom tooth extraction; Hernia repair (Right, 1965); Back surgery; Foot fracture surgery (Left); Fracture surgery; Nasal septum surgery (1986); Finger amputation (Left, 2004); Thyroidectomy, partial (2000s); Cardiac catheterization (1980s); and LEFT HEART CATH AND CORONARY ANGIOGRAPHY (N/A, 02/21/2018).  Brief Summary:  Russell Brown is a 75 y.o. 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      Subjective:   He is here with his wife.  He had repeat CT chest.  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  His wife is concerned that he gets winded more easily, although he still exercises on a regular basis.  Not having cough, wheeze, sputum, leg swelling, hemoptysis, joint swelling, or skin rash.    He used to get frequent episodes of reflux.  He was previously followed by Dr. Scarlette Shorts with [facility].  He had Nissen Fundoplication in 3354.  EGD in April 2019 showed the wrap was undone.  His most recent CT chest showed moderate hiatal hernia.  PFT from December 2021 showed moderate obstruction and moderate to severe diffusion defect.  There has been progression in diffusion defect since 2017.   Physical Exam:   Appearance - well kempt   ENMT - no sinus  tenderness, no oral exudate, no LAN, Mallampati 2 airway, no stridor  Respiratory - decreased breath sounds bilaterally, no wheezing or rales  CV - s1s2 regular rate and rhythm, no murmurs  Ext - no clubbing, no edema  Skin - no rashes  Psych - normal mood and affect   Pulmonary testing:  Spirometry 02/19/16 >> FEV1 2.0 (56%), FEV1% 57 FeNO 02/19/16 >> 21 PFT 05/28/20 >> FEV1 2.04 (58%), FEV1% 51, TLC 6.97 (91%), DLCO 49% Serology 07/04/20 >> ANA, RF negative  Chest Imaging:  HRCT chest 05/12/20 >> atherosclerosis, severe centrilobular emphysema, mild interstitial opacity at lung bases (indeterminate for UIP) CT chest 05/01/21 >> severe emphysema; scattered areas of septal thickening, subpleural reticulation, cylindrical BTX, peripheral bronchiolectasis (probable UIP); mod hiatal hernia, fatty liver  Sleep Tests:  PSG 03/25/18 >> AHI 1.1 ONO with RA 07/11/20 >> test time 8 hrs 17 min.  Basal SpO2 92.3%, low SpO2 84%.  Spent 2.1 min with SpO2 < 88%.  Cardiac Tests:  Echo 08/04/20 >> EF 60 to 65%, grade 1 DD  Social History:  He  reports that he quit smoking about 24 years ago. His smoking use included cigarettes. He has a 35.00 pack-year smoking history. He has never used smokeless tobacco. He reports current alcohol use of about 7.0 standard drinks per week. He reports that he does not use drugs.  Family History:  His family history includes Alzheimer's disease in his paternal uncle; Asthma in his son; Cancer in his father and maternal grandmother;  Heart disease in his paternal grandfather and paternal grandmother; Hyperlipidemia in his father; Hypertension in his father; Osteoporosis in his maternal grandmother, mother, and paternal aunt; Parkinsonism in his mother; Pneumonia in his mother; Prostate cancer in his father.     Assessment/Plan:   COPD with emphysema. - tried symbicort before, but lost effectiveness - continue anoro - prn albuterol  Interstitial lung disease. -  most recent CT chest has probable UIP pattern - after reading on the internet, he thinks his reflux is the culprit for his ILD - will arrange for repeat pulmonary function test - he would like to learn more about research and treatment options: will arrange for appointment with Dr. Chase Caller or Dr. Vaughan Browner in the ILD clinic with [facility]  CAD. - followed by Dr. Kirk Ruths with [facility]  GERD. - might need return visit with Dr. Scarlette Shorts with [facility]  Time Spent Involved in Patient Care on Day of Examination:  38 minutes  Follow up:   Patient Instructions  Will arrange for pulmonary function test  Will arrange for appointment with Dr. Chase Caller or Dr. Vaughan Browner in ILD clinic to discuss treatment and research options for pulmonary fibrosis  Follow up with Dr. Halford Chessman in 4 months  Medication List:   Allergies as of 05/20/2021       Reactions   Codeine Anxiety   Reaction if taken for extended periods of time.        Medication List        Accurate as of May 20, 2021  2:44 PM. If you have any questions, ask your nurse or doctor.          albuterol 108 (90 Base) MCG/ACT inhaler Commonly known as: ProAir HFA Inhale 2 puffs into the lungs every 6 (six) hours as needed for wheezing or shortness of breath.   Anoro Ellipta 62.5-25 MCG/ACT Aepb Generic drug: umeclidinium-vilanterol USE 1 INHALATION BY MOUTH  DAILY   aspirin EC 81 MG tablet Take 81 mg by mouth daily.   atorvastatin 20 MG tablet Commonly known as: LIPITOR TAKE 1 TABLET BY MOUTH  DAILY   dutasteride 0.5 MG capsule Commonly known as: AVODART TAKE 1 CAPSULE BY MOUTH  DAILY   fish oil-omega-3 fatty acids 1000 MG capsule Take 2 g by mouth daily.   levothyroxine 75 MCG tablet Commonly known as: SYNTHROID TAKE 1 TABLET BY MOUTH  DAILY BEFORE BREAKFAST   lisinopril-hydrochlorothiazide 10-12.5 MG tablet Commonly known as: ZESTORETIC TAKE 1 TABLET BY MOUTH  DAILY    multivitamin with minerals Tabs tablet Take 1 tablet by mouth daily.   nitroGLYCERIN 0.4 MG SL tablet Commonly known as: NITROSTAT PLACE 1 TABLET UNDER THE TONGUE EVERY 5 MINUTES AS NEEDED   omeprazole 40 MG capsule Commonly known as: PRILOSEC TAKE 1 CAPSULE BY MOUTH  TWICE DAILY   sertraline 50 MG tablet Commonly known as: ZOLOFT TAKE 1 TABLET BY MOUTH  DAILY        Signature:  Chesley Mires, MD [place] Pager - [phone] 05/20/2021, 2:44 PM

## 2021-05-28 ENCOUNTER — Other Ambulatory Visit: Payer: Self-pay | Admitting: Family Medicine

## 2021-05-28 DIAGNOSIS — I10 Essential (primary) hypertension: Secondary | ICD-10-CM

## 2021-06-05 ENCOUNTER — Other Ambulatory Visit: Payer: Self-pay

## 2021-06-05 ENCOUNTER — Ambulatory Visit (INDEPENDENT_AMBULATORY_CARE_PROVIDER_SITE_OTHER): Payer: Medicare Other | Admitting: Pulmonary Disease

## 2021-06-05 ENCOUNTER — Encounter: Payer: Self-pay | Admitting: Pulmonary Disease

## 2021-06-05 VITALS — BP 130/62 | HR 66 | Temp 98.3°F | Ht 73.0 in | Wt 185.4 lb

## 2021-06-05 DIAGNOSIS — J449 Chronic obstructive pulmonary disease, unspecified: Secondary | ICD-10-CM

## 2021-06-05 DIAGNOSIS — J849 Interstitial pulmonary disease, unspecified: Secondary | ICD-10-CM

## 2021-06-05 NOTE — Patient Instructions (Signed)
Check ANA, rheumatoid factor, CCP Started paperwork for [facility]  Follow-up in 1 to 2 months

## 2021-06-05 NOTE — Progress Notes (Signed)
Russell Brown    [id number]    09/06/1945  Primary Care Physician:Kremer, Mortimer Fries, MD  Referring Physician: Libby Maw, MD [address] [place],   [address]  Chief complaint: Consult for interstitial lung disease   HPI: 75 year old with history of COPD, asthma, GERD, hiatal hernia status post Nissen's fundoplication.  He is followed by Dr. Halford Chessman Referred for CT scan findings showing pulmonary fibrosis He has worsening dyspnea on exertion over several years, nonproductive cough.  No wheezing maintained on anoro inhaler Has severe GERD and follows with Dr. Henrene Pastor, GI.  Nissen fundoplication in 0347.  EGD in April 4259 showed fundoplication was undone.  He continues on PPI but has significant GERD symptoms.  Is due for follow-up with Dr. Henrene Pastor next month  Pets: No pets Occupation: Used to work in the [employer].  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  He also did woodworking as a hobby for many years but he did not have these exposures for the past 20 years.  He has down pillows on the living room couch Smoking history: 35-pack-year smoker.  Quit in 1997 Travel history: No significant travel history Relevant family history: No family history of lung disease  Outpatient Encounter Medications as of 06/05/2021  Medication Sig   albuterol (PROAIR HFA) 108 (90 Base) MCG/ACT inhaler Inhale 2 puffs into the lungs every 6 (six) hours as needed for wheezing or shortness of breath.   ANORO ELLIPTA 62.5-25 MCG/ACT AEPB USE 1 INHALATION BY MOUTH  DAILY   aspirin EC 81 MG tablet Take 81 mg by mouth daily.   atorvastatin (LIPITOR) 20 MG tablet TAKE 1 TABLET BY MOUTH  DAILY   dutasteride (AVODART) 0.5 MG capsule TAKE 1 CAPSULE BY MOUTH  DAILY   fish oil-omega-3 fatty acids 1000 MG capsule Take 2 g by mouth daily.    levothyroxine  (SYNTHROID) 75 MCG tablet TAKE 1 TABLET BY MOUTH  DAILY BEFORE BREAKFAST   lisinopril-hydrochlorothiazide (ZESTORETIC) 10-12.5 MG tablet TAKE 1 TABLET BY MOUTH  DAILY   Multiple Vitamin (MULTIVITAMIN WITH MINERALS) TABS tablet Take 1 tablet by mouth daily.   nitroGLYCERIN (NITROSTAT) 0.4 MG SL tablet PLACE 1 TABLET UNDER THE TONGUE EVERY 5 MINUTES AS NEEDED   omeprazole (PRILOSEC) 40 MG capsule TAKE 1 CAPSULE BY MOUTH  TWICE DAILY   sertraline (ZOLOFT) 50 MG tablet TAKE 1 TABLET BY MOUTH  DAILY   No facility-administered encounter medications on file as of 06/05/2021.    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   COPD (chronic obstructive pulmonary disease) (HCC)    GERD (gastroesophageal reflux disease)    History of hiatal hernia    Hyperlipemia    Hypertension    Hypothyroidism    Pneumonia 2013; 2017   PTSD (post-traumatic stress disorder)    retired Engineer, structural    Pulmonary fibrosis ([place])    Sleep apnea    mild - but does not wear a c-pap per pt   Thyroid disease    thyroid nodules - half of thyroid was removed per pt    Past Surgical History:  Procedure Laterality  Date   APPENDECTOMY     BACK SURGERY     CARDIAC CATHETERIZATION  1980s   "no blockage at that time" (02/20/2018)   CERVICAL SPINE SURGERY     "enlarged hole that nerves went thru"   FINGER AMPUTATION Left 2004   2nd and 3rd digits; "table saw injury"   FOOT FRACTURE SURGERY Left    "heel OR; put 2 steel rods in and took them out 6 wks later"   [place] CATH AND CORONARY ANGIOGRAPHY N/A 02/21/2018   Procedure: LEFT HEART CATH AND CORONARY ANGIOGRAPHY;  Surgeon: Troy Sine, MD;  Location: [facility];  Service:  Cardiovascular;  Laterality: N/A;   NASAL SEPTUM SURGERY  7169   NISSEN FUNDOPLICATION  6789F   "w/hernia repair"   THYROIDECTOMY, PARTIAL  2000s   WISDOM TOOTH EXTRACTION      Family History  Problem Relation Age of Onset   Parkinsonism Mother    Osteoporosis Mother    Pneumonia Mother        76   Cancer Father        prostate,  skin   Hyperlipidemia Father    Hypertension Father    Prostate cancer Father    Asthma Son    Osteoporosis Paternal Aunt    Alzheimer's disease Paternal Uncle    Osteoporosis Maternal Grandmother    Cancer Maternal Grandmother        lung   Heart disease Paternal Grandmother        MI   Heart disease Paternal Grandfather        MI   Colon cancer Neg Hx    Pancreatic cancer Neg Hx    Stomach cancer Neg Hx    Esophageal cancer Neg Hx    Liver cancer Neg Hx    Rectal cancer Neg Hx     Social History   Socioeconomic History   Marital status: Married    Spouse name: Not on file   Number of children: 4   Years of education: Not on file   Highest education level: Not on file  Occupational History   Occupation: retired Warden/ranger    Employer: [employer]  Tobacco Use   Smoking status: Former    Packs/day: 1.00    Years: 35.00    Pack years: 35.00    Types: Cigarettes    Quit date: 08/18/1996    Years since quitting: 24.8   Smokeless tobacco: Never  Vaping Use   Vaping Use: Never used  Substance and Sexual Activity   Alcohol use: Yes    Alcohol/week: 7.0 standard drinks    Types: 7 Cans of beer per week   Drug use: Never   Sexual activity: Not Currently    Partners: Female  Other Topics Concern   Not on file  Social History Narrative   Exercise- weights and walk on treadmill   Social Determinants of Health   Financial Resource Strain: Low Risk    Difficulty of Paying Living Expenses: Not hard at all  Food Insecurity: No Food Insecurity   Worried About Charity fundraiser in the Last Year: Never true   Ran Out of  Food in the Last Year: Never true  Transportation Needs: No Transportation Needs   Lack of Transportation (Medical): No   Lack of Transportation (Non-Medical): No  Physical Activity: Insufficiently Active   Days of Exercise per Week: 2 days  Minutes of Exercise per Session: 60 min  Stress: No Stress Concern Present   Feeling of Stress : Not at all  Social Connections: Moderately Integrated   Frequency of Communication with Friends and Family: Twice a week   Frequency of Social Gatherings with Friends and Family: Twice a week   Attends Religious Services: 1 to 4 times per year   Active Member of [employer] or Organizations: No   Attends Music therapist: Never   Marital Status: Married  Human resources officer Violence: Not At Risk   Fear of Current or Ex-Partner: No   Emotionally Abused: No   Physically Abused: No   Sexually Abused: No    Review of systems: Review of Systems  Constitutional: Negative for fever and chills.  HENT: Negative.   Eyes: Negative for blurred vision.  Respiratory: as per HPI  Cardiovascular: Negative for chest pain and palpitations.  Gastrointestinal: Negative for vomiting, diarrhea, blood per rectum. Genitourinary: Negative for dysuria, urgency, frequency and hematuria.  Musculoskeletal: Negative for myalgias, back pain and joint pain.  Skin: Negative for itching and rash.  Neurological: Negative for dizziness, tremors, focal weakness, seizures and loss of consciousness.  Endo/Heme/Allergies: Negative for environmental allergies.  Psychiatric/Behavioral: Negative for depression, suicidal ideas and hallucinations.  All other systems reviewed and are negative.  Physical Exam: Blood pressure 130/62, pulse 66, temperature 98.3 F (36.8 C), temperature source Oral, height 6\' 1"  (1.854 m), weight 185 lb 6.4 oz (84.1 kg), SpO2 97 %. Gen:      No acute distress HEENT:  EOMI, sclera anicteric Neck:     No masses; no thyromegaly Lungs:    Clear to  auscultation bilaterally; normal respiratory effort CV:         Regular rate and rhythm; no murmurs Abd:      + bowel sounds; soft, non-tender; no palpable masses, no distension Ext:    No edema; adequate peripheral perfusion Skin:      Warm and dry; no rash Neuro: alert and oriented x 3 Psych: normal mood and affect  Data Reviewed: Imaging: High-res CT 05/12/2020-severe emphysema, irregular peripheral opacity in the lower lobes.  Indeterminate for UIP CT chest 04/30/2021-mild progression of basal fibrotic changes with reticulation, traction bronchiectasis.  No honeycombing.  Probable UIP pattern I have reviewed the images personally.  PFTs: 05/28/2020 FVC 3.98 [83%], FEV1 2.04 [58%], F/F 51, TLC 6.97 [91%], DLCO 13.73 [49%] Moderate obstruction, severe diffusion defect  Labs: ANA, rheumatoid factor 07/04/20-negative  Assessment:  Assessment for interstitial lung disease.   I have reviewed his scans dating back to 2006.  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  He does also have ongoing GERD and reflux which is likely contributing to progression.  There is remote exposures to Aspergillus and wood dust but not recently so I do not believe this is the reason for his progressive pulmonary fibrosis.  We discussed treatment options and antifibrotics with patient and he wants to try them to reduce progression of disease.  Recent LFTs are normal Recheck baseline ANA immunofluorescence and rheumatoid factor CCP. Start paperwork for Esbriet  He will follow-up with GI to get his reflux under control and also get rid of his down pillows  Plan/Recommendations: ANA immunofluorescence, rheumatoid factor and CCP Start paperwork for Esbriet  Marshell Garfinkel MD  [facility] 06/05/2021, 9:34  AM  CC: Libby Maw,*

## 2021-06-05 NOTE — Addendum Note (Signed)
Addended by: Elton Sin on: 06/05/2021 10:41 AM   Modules accepted: Orders

## 2021-06-08 ENCOUNTER — Telehealth: Payer: Self-pay | Admitting: Pharmacist

## 2021-06-08 LAB — ANA: Anti Nuclear Antibody (ANA): POSITIVE — AB

## 2021-06-08 LAB — ANTI-NUCLEAR AB-TITER (ANA TITER): ANA Titer 1: 1:40 {titer} — ABNORMAL HIGH

## 2021-06-08 LAB — RHEUMATOID FACTOR: Rheumatoid fact SerPl-aCnc: <14 IU/mL (ref <14)

## 2021-06-08 LAB — CYCLIC CITRUL PEPTIDE ANTIBODY, IGG: Cyclic Citrullin Peptide Ab: <16 UNITS

## 2021-06-08 NOTE — Telephone Encounter (Signed)
Received new start paperwork for Esbriet/pirfenidone  Submitted a Prior Authorization request to [facility] for PIRFENIDONE via CoverMyMeds. Will update once we receive a response.  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

## 2021-06-09 ENCOUNTER — Other Ambulatory Visit (HOSPITAL_COMMUNITY): Payer: Self-pay

## 2021-06-09 NOTE — Telephone Encounter (Signed)
Received notification from [facility] regarding a prior authorization for PIRFENIDONE. Authorization has been APPROVED from 06/08/21 to 06/20/22.   Per test claim, copay for 30 days supply is $7  Patient can fill through [place]: [phone]   Authorization # [id number] Phone # [phone]   Anticipate that next year his copay will increase so application to [facility] faxed today. Submitted Patient Assistance Application to [place] for [place] along with provider portion, patient portion, med list, insurance card copy, and PA. Will update patient when we receive a response.  Fax# [phone] Phone# [phone]   Knox Saliva, PharmD, MPH, BCPS Clinical Pharmacist (Rheumatology and Pulmonology)

## 2021-06-19 ENCOUNTER — Encounter: Payer: Self-pay | Admitting: Pulmonary Disease

## 2021-06-23 MED ORDER — PIRFENIDONE 267 MG PO CAPS: 267.0000 mg | ORAL_CAPSULE | Freq: Three times a day (TID) | ORAL | 1 refills | Status: DC

## 2021-06-24 ENCOUNTER — Encounter: Payer: Self-pay | Admitting: Family Medicine

## 2021-06-24 ENCOUNTER — Other Ambulatory Visit (HOSPITAL_COMMUNITY): Payer: Self-pay

## 2021-06-24 DIAGNOSIS — F4310 Post-traumatic stress disorder, unspecified: Secondary | ICD-10-CM

## 2021-06-24 NOTE — Telephone Encounter (Signed)
Received a fax from [place] regarding an approval for Pirfenidone patient assistance from 06/23/2021 until patient no longer qualifies due to discontinuation of therapy, changes to patient's current financial status or health insurance and/or patient no longer meets the program eligibility requirements. Called pt and provided update and next steps, phone numbers have been provided. Encouraged pt to reach out if she has any additional questions or concerns. Pt verbalized understanding to all. Relevant Documents have been sent to scan center. Nothing further required at this time.  [employer] Phone#: [phone] option 5 [employer] Phone#: [phone]

## 2021-06-24 NOTE — Telephone Encounter (Signed)
Routing for clinical f/u.

## 2021-06-25 MED ORDER — SERTRALINE HCL 50 MG PO TABS: 50.0000 mg | ORAL_TABLET | Freq: Every day | ORAL | 0 refills | Status: DC

## 2021-06-30 ENCOUNTER — Other Ambulatory Visit (HOSPITAL_COMMUNITY): Payer: Self-pay

## 2021-06-30 NOTE — Telephone Encounter (Signed)
Called patient who states that he has shipment of Esbriet to be expected at home on 07/10/21. He states he has cancelled his order with [facility].  I will call him around 1pm tomorrow, 07/01/21 to review Esbriet  Knox Saliva, PharmD, MPH, BCPS Clinical Pharmacist (Rheumatology and Pulmonology)

## 2021-07-01 ENCOUNTER — Telehealth: Payer: Self-pay | Admitting: Pharmacist

## 2021-07-01 NOTE — Telephone Encounter (Unsigned)
Subjective:  Patient called today by [facility] pharmacy team for Esbriet new start counseling.  Patient was last seen by Dr. Vaughan Browner on 06/05/21.  History of elevated LFTs: No History of diarrhea, nausea, vomiting: No  Objective: Allergies  Allergen Reactions   Codeine Anxiety    Reaction if taken for extended periods of time.    Outpatient Encounter Medications as of 07/01/2021  Medication Sig   albuterol (PROAIR HFA) 108 (90 Base) MCG/ACT inhaler Inhale 2 puffs into the lungs every 6 (six) hours as needed for wheezing or shortness of breath.   ANORO ELLIPTA 62.5-25 MCG/ACT AEPB USE 1 INHALATION BY MOUTH  DAILY   aspirin EC 81 MG tablet Take 81 mg by mouth daily.   atorvastatin (LIPITOR) 20 MG tablet TAKE 1 TABLET BY MOUTH  DAILY   dutasteride (AVODART) 0.5 MG capsule TAKE 1 CAPSULE BY MOUTH  DAILY   fish oil-omega-3 fatty acids 1000 MG capsule Take 2 g by mouth daily.    levothyroxine (SYNTHROID) 75 MCG tablet TAKE 1 TABLET BY MOUTH  DAILY BEFORE BREAKFAST   lisinopril-hydrochlorothiazide (ZESTORETIC) 10-12.5 MG tablet TAKE 1 TABLET BY MOUTH  DAILY   Multiple Vitamin (MULTIVITAMIN WITH MINERALS) TABS tablet Take 1 tablet by mouth daily.   nitroGLYCERIN (NITROSTAT) 0.4 MG SL tablet PLACE 1 TABLET UNDER THE TONGUE EVERY 5 MINUTES AS NEEDED   omeprazole (PRILOSEC) 40 MG capsule TAKE 1 CAPSULE BY MOUTH  TWICE DAILY   Pirfenidone 267 MG CAPS Take 267 mg by mouth in the morning, at noon, and at bedtime.   sertraline (ZOLOFT) 50 MG tablet Take 1 tablet (50 mg total) by mouth daily.   No facility-administered encounter medications on file as of 07/01/2021.     Immunization History  Administered Date(s) Administered   Fluad Quad(high Dose 65+) 03/16/2019   H1N1 05/28/2008   Influenza Whole 04/28/2007, 03/26/2008, 04/16/2009, 05/07/2010, 04/10/2011, 04/13/2012   Influenza, High Dose Seasonal PF 03/05/2014, 03/17/2015, 03/17/2018, 03/26/2019   Influenza, Seasonal, Injecte,  Preservative Fre 03/06/2013   Influenza,inj,Quad PF,6+ Mos 03/04/2016   Influenza-Unspecified 03/22/2011, 03/11/2020, 03/27/2021   Moderna SARS-COV2 Booster Vaccination 05/05/2020   Moderna Sars-Covid-2 Vaccination 08/06/2019, 09/04/2019   Pneumococcal Conjugate-13 12/05/2014   Pneumococcal Polysaccharide-23 04/11/2008, 04/18/2013, 03/16/2019   Td 05/22/2009, 02/16/2011   Zoster Recombinat (Shingrix) 03/21/2017, 09/10/2017   Zoster, Live 05/22/2009    PFT's TLC  Date Value Ref Range Status  05/28/2020 6.97 L Final    CMP     Component Value Date/Time   NA 140 05/06/2021 0915   K 3.9 05/06/2021 0915   CL 103 05/06/2021 0915   CO2 27 05/06/2021 0915   GLUCOSE 104 (H) 05/06/2021 0915   GLUCOSE 98 04/22/2006 1057   BUN 14 05/06/2021 0915   CREATININE 1.08 05/06/2021 0915   CALCIUM 9.1 05/06/2021 0915   PROT 6.5 05/06/2021 0915   ALBUMIN 4.5 05/06/2021 0915   AST 19 05/06/2021 0915   ALT 24 05/06/2021 0915   ALKPHOS 70 05/06/2021 0915   BILITOT 0.8 05/06/2021 0915   GFRNONAA >60 04/26/2018 0913   GFRAA >60 04/26/2018 0913    CBC    Component Value Date/Time   WBC 7.2 05/06/2021 0915   RBC 5.20 05/06/2021 0915   HGB 15.6 05/06/2021 0915   HCT 47.0 05/06/2021 0915   PLT 215.0 05/06/2021 0915   MCV 90.4 05/06/2021 0915   MCH 30.0 04/26/2018 0913   MCHC 33.1 05/06/2021 0915   RDW 13.6 05/06/2021 0915   LYMPHSABS 1.1 04/04/2019  1008   MONOABS 0.8 04/04/2019 1008   EOSABS 0.1 04/04/2019 1008   BASOSABS 0.0 04/04/2019 1008    LFT's Hepatic Function Latest Ref Rng & Units 05/06/2021 03/17/2020 03/16/2019  Total Protein 6.0 - 8.3 g/dL 6.5 6.3 6.3  Albumin 3.5 - 5.2 g/dL 4.5 4.3 4.3  AST 0 - 37 U/L $Remo'19 21 20  'eBUYE$ ALT 0 - 53 U/L $Remo'24 26 28  'WZrrM$ Alk Phosphatase 39 - 117 U/L 70 64 74  Total Bilirubin 0.2 - 1.2 mg/dL 0.8 0.8 0.7  Bilirubin, Direct 0.0 - 0.3 mg/dL 0.1 - -    CT chest w/o contrast - concurrent progressive interstitial disease, categorized as probable usual interstitial  pneumonia (UIP) per current ATS guidelines  Assessment and Plan  Esbriet Medication Management Thoroughly counseled patient on the efficacy, mechanism of action, dosing, administration, adverse effects, and monitoring parameters of Esbriet.  Patient verbalized understanding. Patient education handout provided.   Goals of Therapy: Will not stop or reverse the progression of ILD. It will slow the progression of ILD.   Dosing: Starting dose will be Esbriet 267 mg 1 tablet three times daily for 7 days, then 2 tablets three times daily for 7 days, then 3 tablets three times daily.  Maintenance dose will be 801 mg 1 tablet three times daily if tolerated.  Stressed the importance of taking with meals and space at least 5-6 hours apart to minimize stomach upset.   Adverse Effects: Nausea, vomiting, diarrhea, weight loss Abdominal pain GERD - patient is seeing GI on 07/07/21 to re-establish care Sun sensitivity/rash - patient advised to wear sunscreen when exposed to sunlight Dizziness Fatigue  Monitoring: Monitor for diarrhea, nausea and vomiting, GI perforation, hepatotoxicity  Monitor LFTs - baseline, monthly for first 6 months, then every 3 months routinely  Access: Approval of Esbriet through: patient assistance Rx sent to: [place] [facility], dispensing) for [place]: [phone] Patient receiving first shipment of Esbriet on 07/10/21  Medication Reconciliation A drug regimen assessment was performed, including review of allergies, interactions, disease-state management, dosing and immunization history. Medications were reviewed with the patient, including name, instructions, indication, goals of therapy, potential side effects, importance of adherence, and safe use.  Immunizations Patient has received 3 COVID19 vaccines. He is UTD on influenza, pneumonia, and shingles vaccine.  This appointment required 45 minutes of patient care (this includes precharting, chart review, review of  results, face-to-face care, etc.).  Thank you for involving pharmacy to assist in providing this patient's care.   Knox Saliva, PharmD, MPH, BCPS Clinical Pharmacist (Rheumatology and Pulmonology)

## 2021-07-02 ENCOUNTER — Other Ambulatory Visit: Payer: Self-pay | Admitting: Family

## 2021-07-02 ENCOUNTER — Other Ambulatory Visit: Payer: Self-pay | Admitting: Family Medicine

## 2021-07-02 DIAGNOSIS — E039 Hypothyroidism, unspecified: Secondary | ICD-10-CM

## 2021-07-02 DIAGNOSIS — N401 Enlarged prostate with lower urinary tract symptoms: Secondary | ICD-10-CM

## 2021-07-04 ENCOUNTER — Encounter: Payer: Self-pay | Admitting: Pulmonary Disease

## 2021-07-07 ENCOUNTER — Encounter: Payer: Self-pay | Admitting: Internal Medicine

## 2021-07-07 ENCOUNTER — Ambulatory Visit (INDEPENDENT_AMBULATORY_CARE_PROVIDER_SITE_OTHER): Payer: Medicare Other | Admitting: Internal Medicine

## 2021-07-07 ENCOUNTER — Other Ambulatory Visit (HOSPITAL_COMMUNITY): Payer: Self-pay

## 2021-07-07 VITALS — BP 100/70 | HR 65 | Ht 73.0 in | Wt 183.0 lb

## 2021-07-07 DIAGNOSIS — K219 Gastro-esophageal reflux disease without esophagitis: Secondary | ICD-10-CM

## 2021-07-07 DIAGNOSIS — R1310 Dysphagia, unspecified: Secondary | ICD-10-CM

## 2021-07-07 DIAGNOSIS — Z1211 Encounter for screening for malignant neoplasm of colon: Secondary | ICD-10-CM | POA: Diagnosis not present

## 2021-07-07 DIAGNOSIS — K5730 Diverticulosis of large intestine without perforation or abscess without bleeding: Secondary | ICD-10-CM | POA: Diagnosis not present

## 2021-07-07 NOTE — Telephone Encounter (Signed)
Called and spoke with patient.  Patient is scheduled full PFT 08/12/21 at 1200 and follow up OV 08/12/21 at 2pm with Dr. Vaughan Browner.  Nothing further at this time.

## 2021-07-07 NOTE — Patient Instructions (Signed)
You have been scheduled for a modified barium swallow on 07/22/2021 at 1:00pm. Please arrive 15 minutes prior to your test for registration. You will go to [facility] (1st Floor) for your appointment. Should you need to cancel or reschedule your appointment, please contact [phone] [facility]uss[facility]) or [phone] [place]). _____________________________________________________________________ A Modified Barium Swallow Study, or MBS, is a special x-ray that is taken to check swallowing skills. It is carried out by a Stage manager and a Psychologist, clinical (SLP). During this test, yourmouth, throat, and esophagus, a muscular tube which connects your mouth to your stomach, is checked. The test will help you, your doctor, and the SLP plan what types of foods and liquids are easier for you to swallow. The SLP will also identify positions and ways to help you swallow more easily and safely. What will happen during an MBS? You will be taken to an x-ray room and seated comfortably. You will be asked to swallow small amounts of food and liquid mixed with barium. Barium is a liquid or paste that allows images of your mouth, throat and esophagus to be seen on x-ray. The x-ray captures moving images of the food you are swallowing as it travels from your mouth through your throat and into your esophagus. This test helps identify whether food or liquid is entering your lungs (aspiration). The test also shows which part of your mouth or throat lacks strength or coordination to move the food or liquid in the right direction. This test typically takes 30 minutes to 1 hour to complete. ______________________________________________________________ Dennis Bast have been scheduled for an endoscopy. Please follow written instructions given to you at your visit today. If you use inhalers (even only as needed), please bring them with you on the day of your procedure.

## 2021-07-07 NOTE — Telephone Encounter (Signed)
His last PFT was done in 2021 not 2022. He will need follow-up PFTs.  Please see if it can be done on the day of his return visit next month

## 2021-07-07 NOTE — Progress Notes (Signed)
HISTORY OF PRESENT ILLNESS:  Russell Brown is a 76 y.o. male with multiple medical problems including hypertension, hyperlipidemia, hypothyroidism, idiopathic pulmonary fibrosis, and GERD.  Patient sent today regarding chronic GERD, coughing or choking spells when eating, and concerns over the initiation of Esbriet therapy due to a high GI side effect profile.  Patient has been seen in this office for screening colonoscopy which she underwent in 2003 and again in 2014.  Both examinations were negative for neoplasia.  Left-sided diverticulosis noted.  He also underwent upper endoscopy in 2019 to evaluate chronic hoarseness.  The examination was normal.  He does have a history of GERD and remote fundoplication which was noted to have a slipped wrap on the recent upper endoscopy.  He tells me that he has been diagnosed with pulmonary fibrosis.  Tells me that his pulmonologist has suggested that his lung problem is either caused by or exacerbated by reflux disease.  Patient does tell me at night he will have regurgitation episodes with coughing and throat burning.  He also describes more frequent problems with swallowing solids but mostly liquids that resulting coughing spells.  He does not clearly describe true esophageal dysphagia but rather oropharyngeal dysphagia.  For breakthrough reflux symptoms he uses Gaviscon.  He is accompanied today by his wife.  Review of blood work from November 2022 shows unremarkable comprehensive metabolic panel and CBC with hemoglobin 15.6.  CT scan of the chest November 2022 revealed pulmonary abnormalities and coronary calcification.  As well moderate hiatal hernia.  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   COPD (chronic obstructive pulmonary disease) (HCC)    GERD (gastroesophageal reflux disease)    History of hiatal hernia    Hyperlipemia    Hypertension    Hypothyroidism    Pneumonia 2013; 2017   PTSD (post-traumatic stress disorder)    retired Engineer, structural    Pulmonary fibrosis ([place])    Sleep apnea    mild - but does not wear a c-pap per pt   Thyroid disease    thyroid nodules - half of thyroid was removed per pt    Past Surgical History:  Procedure Laterality Date   APPENDECTOMY     BACK SURGERY     CARDIAC CATHETERIZATION  1980s   "no blockage at that time" (02/20/2018)   CERVICAL SPINE SURGERY     "enlarged hole that nerves went thru"   FINGER AMPUTATION Left 2004   2nd and 3rd digits; "table saw injury"   FOOT FRACTURE SURGERY Left    "heel OR; put 2 steel rods in and took them out 6 wks later"   [place] CATH AND CORONARY ANGIOGRAPHY N/A 02/21/2018   Procedure: LEFT HEART CATH AND CORONARY ANGIOGRAPHY;  Surgeon: Troy Sine, MD;  Location: [facility];  Service: Cardiovascular;  Laterality: N/A;   NASAL SEPTUM SURGERY  0354   NISSEN FUNDOPLICATION  6568L   "w/hernia repair"   THYROIDECTOMY, PARTIAL  2000s   WISDOM TOOTH EXTRACTION      Social History MADDYX WIECK  reports that he quit smoking about 24  years ago. His smoking use included cigarettes. He has a 35.00 pack-year smoking history. He has never used smokeless tobacco. He reports current alcohol use of about 7.0 standard drinks per week. He reports that he does not use drugs.  family history includes Alzheimer's disease in his paternal uncle; Asthma in his son; Cancer in his father and maternal grandmother; Heart disease in his paternal grandfather and paternal grandmother; Hyperlipidemia in his father; Hypertension in his father; Osteoporosis in his maternal grandmother, mother, and paternal aunt; Parkinsonism in his mother; Pneumonia  in his mother; Prostate cancer in his father.  Allergies  Allergen Reactions   Codeine Anxiety    Reaction if taken for extended periods of time.       PHYSICAL EXAMINATION: Vital signs: BP 100/70    Pulse 65    Ht 6\' 1"  (1.854 m)    Wt 183 lb (83 kg)    BMI 24.14 kg/m   Constitutional: generally well-appearing, no acute distress Psychiatric: alert and oriented x3, cooperative Eyes: extraocular movements intact, anicteric, conjunctiva pink Mouth: oral pharynx moist, no lesions Neck: supple no lymphadenopathy Cardiovascular: heart regular rate and rhythm, no murmur Lungs: clear to auscultation bilaterally Abdomen: soft, nontender, nondistended, no obvious ascites, no peritoneal signs, normal bowel sounds, no organomegaly Rectal: Omitted Extremities: no lower extremity edema bilaterally, the missing 2 digits on left hand Skin: no lesions on visible extremities Neuro: No focal deficits.  Cranial nerves intact  ASSESSMENT:  1.  GERD.  Remote history of fundoplication.  Unwrapped on 2019 EGD.  Some breakthrough symptoms despite omeprazole 20 mg twice daily.  Hiatal hernia on CT. 2.  Coughing and choking spells with meals, particularly liquids.  Rule out oropharyngeal dysphagia/aspiration 3.  Negative for neoplasia screening colonoscopy 2003 and 2014.  Incidental diverticulosis 4.  Multiple medical problems including IPF  PLAN:  1.  Reflux precautions.  Reviewed.  Avoid late meals.  Elevate head of bed. 2.  Continue twice daily PPI 3.  May need to add at night H2 receptor antagonist 4.  Schedule upper endoscopy to evaluate worsening reflux symptoms and vague dysphagia.The nature of the procedure, as well as the risks, benefits, and alternatives were carefully and thoroughly reviewed with the patient. Ample time for discussion and questions allowed. The patient understood, was satisfied, and agreed to proceed.  5.  Schedule modified barium swallow with speech pathology to rule out  oropharyngeal dysphagia and aspiration.  If abnormal, the speech pathologist may be able to provide recommendations such as chin tuck maneuver and/or adding thickener to liquids. 6.  Additional recommendations and the need for follow-up to be determined

## 2021-07-09 ENCOUNTER — Encounter: Payer: Self-pay | Admitting: Family Medicine

## 2021-07-09 DIAGNOSIS — E039 Hypothyroidism, unspecified: Secondary | ICD-10-CM

## 2021-07-09 MED ORDER — LEVOTHYROXINE SODIUM 75 MCG PO TABS: 75.0000 ug | ORAL_TABLET | Freq: Every day | ORAL | 3 refills | Status: DC

## 2021-07-10 ENCOUNTER — Encounter: Payer: Self-pay | Admitting: Pulmonary Disease

## 2021-07-10 ENCOUNTER — Telehealth: Payer: Self-pay

## 2021-07-10 NOTE — Telephone Encounter (Signed)
Please see message below from patient. He wants to know if he still needs to have it done  I JUST NOTICED THAT DR [facility] ORDERED THIS HYPERSENSITIVITY PNEUMONITIS TEST ON DEC 16 BUT I HAVE NOT HEARD ANYTHING FROM [facility] REGARDING SAME - AM I STILL SUPPOSED TO HAVE THIS TEST?  THANKS!

## 2021-07-10 NOTE — Telephone Encounter (Signed)
Called and spoke with patient regarding patient message sent to [facility] scheduling pool for chest pain/needing appointment with Dr. Stanford Breed.   Patient states that he had an episode of chest pain last night that woke him from his sleep. Patient states he took 1 dose of nitro with relief. Patient reports that the pain was a sharp pain and pressure that last for a couple minutes. Patients states he has not had any  more chest pain since but does state he has a slight pressure. Patient states he was also recently diagnosed with pulmonary fibrosis and states there were other findings on the CT that Dr. Stanford Breed should be aware about as well (report in Epic) patient states that at present he feels well and denies any chest pain with exertion or shortness of breath. Made patient aware of ED precautions should new or worsening symptoms develop. Patient verbalized understanding. Scheduled patient to follow up with Dr. Stanford Breed on 1/25 at [facility] office- per patient request.   Advised patient I would forward message to Dr .Stanford Breed to make him aware and patient verbalized understanding.

## 2021-07-13 ENCOUNTER — Other Ambulatory Visit (HOSPITAL_COMMUNITY): Payer: Self-pay

## 2021-07-14 NOTE — Progress Notes (Signed)
HPI: FU CP. Patient admitted with chest pain September 2019. Cardiac catheterization September 2019 showed a 25% right coronary artery and 50 to 55% ejection fraction. Abd ultrasound 10/20 showed no aneurysm.  Echocardiogram February 2022 showed normal LV function, grade 1 diastolic dysfunction, trace aortic insufficiency.  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  Since last seen 5 days ago patient had an episode of chest pressure in the substernal area similar to his symptoms in 2019.  Resolved after 3 nitroglycerin.  He otherwise has not had exertional chest pain.  He has dyspnea on exertion from pulmonary fibrosis.  No orthopnea, PND, pedal edema or syncope.  Current Outpatient Medications  Medication Sig Dispense Refill   albuterol (PROAIR HFA) 108 (90 Base) MCG/ACT inhaler Inhale 2 puffs into the lungs every 6 (six) hours as needed for wheezing or shortness of breath. 18 g 1   amoxicillin (AMOXIL) 500 MG capsule Take 500 mg by mouth every 6 (six) hours. Before dental appointment     ANORO ELLIPTA 62.5-25 MCG/ACT AEPB USE 1 INHALATION BY MOUTH  DAILY 180 each 3   aspirin EC 81 MG tablet Take 81 mg by mouth daily.     atorvastatin (LIPITOR) 20 MG tablet TAKE 1 TABLET BY MOUTH  DAILY 90 tablet 3   dutasteride (AVODART) 0.5 MG capsule TAKE 1 CAPSULE BY MOUTH  DAILY 90 capsule 3   fish oil-omega-3 fatty acids 1000 MG capsule Take 2 g by mouth daily.      levothyroxine (SYNTHROID) 75 MCG tablet Take 1 tablet (75 mcg total) by mouth daily before breakfast. 90 tablet 3   lisinopril-hydrochlorothiazide (ZESTORETIC) 10-12.5 MG tablet TAKE 1 TABLET BY MOUTH  DAILY 90 tablet 3   Multiple Vitamin (MULTIVITAMIN WITH MINERALS) TABS tablet Take 1 tablet by mouth daily.     omeprazole (PRILOSEC) 40 MG capsule TAKE 1 CAPSULE BY MOUTH  TWICE DAILY 180 capsule 3    Pirfenidone 267 MG CAPS Take 267 mg by mouth in the morning, at noon, and at bedtime. 270 capsule 1   sertraline (ZOLOFT) 50 MG tablet Take 1 tablet (50 mg total) by mouth daily. 90 tablet 0   nitroGLYCERIN (NITROSTAT) 0.4 MG SL tablet PLACE 1 TABLET UNDER THE TONGUE EVERY 5 MINUTES AS NEEDED 25 tablet 3   No current facility-administered medications for this visit.     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   COPD (chronic obstructive pulmonary disease) (HCC)    GERD (gastroesophageal reflux disease)    History of hiatal hernia    Hyperlipemia    Hypertension    Hypothyroidism    Pneumonia 2013; 2017   PTSD (post-traumatic stress disorder)    retired Engineer, structural    Pulmonary fibrosis ([place])    Sleep apnea    mild - but does not wear a c-pap per pt   Thyroid disease    thyroid nodules - half of thyroid was removed per pt    Past Surgical History:  Procedure Laterality Date   APPENDECTOMY     BACK SURGERY     CARDIAC CATHETERIZATION  1980s   "no blockage at that time" (02/20/2018)   CERVICAL SPINE SURGERY     "enlarged hole that nerves went thru"  FINGER AMPUTATION Left 2004   2nd and 3rd digits; "table saw injury"   FOOT FRACTURE SURGERY Left    "heel OR; put 2 steel rods in and took them out 6 wks later"   [facility] CATH AND CORONARY ANGIOGRAPHY N/A 02/21/2018   Procedure: LEFT HEART CATH AND CORONARY ANGIOGRAPHY;  Surgeon: Troy Sine, MD;  Location: [facility];  Service: Cardiovascular;  Laterality: N/A;   NASAL SEPTUM SURGERY  0762   NISSEN FUNDOPLICATION  2633H   "w/hernia repair"   THYROIDECTOMY, PARTIAL  2000s   WISDOM TOOTH EXTRACTION      Social History   Socioeconomic History   Marital status: Married    Spouse name: Not on file   Number of children: 4   Years of education:  Not on file   Highest education level: Not on file  Occupational History   Occupation: retired Warden/ranger    Employer: [employer]  Tobacco Use   Smoking status: Former    Packs/day: 1.00    Years: 35.00    Pack years: 35.00    Types: Cigarettes    Quit date: 08/18/1996    Years since quitting: 24.9   Smokeless tobacco: Never  Vaping Use   Vaping Use: Never used  Substance and Sexual Activity   Alcohol use: Yes    Alcohol/week: 7.0 standard drinks    Types: 7 Cans of beer per week   Drug use: Never   Sexual activity: Not Currently    Partners: Female  Other Topics Concern   Not on file  Social History Narrative   Exercise- weights and walk on treadmill   Social Determinants of Health   Financial Resource Strain: Low Risk    Difficulty of Paying Living Expenses: Not hard at all  Food Insecurity: No Food Insecurity   Worried About Charity fundraiser in the Last Year: Never true   Ran Out of Food in the Last Year: Never true  Transportation Needs: No Transportation Needs   Lack of Transportation (Medical): No   Lack of Transportation (Non-Medical): No  Physical Activity: Insufficiently Active   Days of Exercise per Week: 2 days   Minutes of Exercise per Session: 60 min  Stress: No Stress Concern Present   Feeling of Stress : Not at all  Social Connections: Moderately Integrated   Frequency of Communication with Friends and Family: Twice a week   Frequency of Social Gatherings with Friends and Family: Twice a week   Attends Religious Services: 1 to 4 times per year   Active Member of [employer] or Organizations: No   Attends Music therapist: Never   Marital Status: Married  Human resources officer Violence: Not At Risk   Fear of Current or Ex-Partner: No   Emotionally Abused: No   Physically Abused: No   Sexually Abused: No    Family History  Problem Relation Age of Onset   Parkinsonism Mother    Osteoporosis Mother    Pneumonia Mother        6    Cancer Father        prostate,  skin   Hyperlipidemia Father    Hypertension Father    Prostate cancer Father    Asthma Son    Osteoporosis Paternal Aunt    Alzheimer's disease Paternal Uncle    Osteoporosis Maternal Grandmother    Cancer Maternal Grandmother  lung   Heart disease Paternal Grandmother        MI   Heart disease Paternal Grandfather        MI   Colon cancer Neg Hx    Pancreatic cancer Neg Hx    Stomach cancer Neg Hx    Esophageal cancer Neg Hx    Liver cancer Neg Hx    Rectal cancer Neg Hx     ROS: no fevers or chills, productive cough, hemoptysis, dysphasia, odynophagia, melena, hematochezia, dysuria, hematuria, rash, seizure activity, orthopnea, PND, pedal edema, claudication. Remaining systems are negative.  Physical Exam: Well-developed well-nourished in no acute distress.  Skin is warm and dry.  HEENT is normal.  Neck is supple.  Chest is clear to auscultation with normal expansion.  Cardiovascular exam is regular rate and rhythm.  Abdominal exam nontender or distended. No masses palpated. Extremities show no edema. neuro grossly intact  ECG-normal sinus rhythm with occasional PVC and no ST changes.  Personally reviewed  A/P  1 chest pain-patient had an episode of short-lived chest pain similar to his symptoms in 2019.  Cardiac catheterization at that time revealed minimal plaque.  Electrocardiogram shows no diagnostic ST changes.  We discussed options today and we will plan to follow for now.  If he has more frequent episodes we will consider cardiac CTA.  2 coronary artery disease-based on coronary calcification noted on CT scan.  Plan to continue medical therapy with aspirin and statin.  3 hypertension-patient's blood pressure is controlled.  Continue present medications.  4 hyperlipidemia-continue statin.  5 COPD/pulmonary fibrosis-followed by pulmonary.  Kirk Ruths, MD

## 2021-07-15 ENCOUNTER — Encounter: Payer: Self-pay | Admitting: Cardiology

## 2021-07-15 ENCOUNTER — Ambulatory Visit (INDEPENDENT_AMBULATORY_CARE_PROVIDER_SITE_OTHER): Payer: Medicare Other | Admitting: Cardiology

## 2021-07-15 ENCOUNTER — Other Ambulatory Visit (HOSPITAL_BASED_OUTPATIENT_CLINIC_OR_DEPARTMENT_OTHER): Payer: Self-pay

## 2021-07-15 ENCOUNTER — Other Ambulatory Visit: Payer: Self-pay

## 2021-07-15 VITALS — BP 128/70 | HR 65 | Ht 73.0 in | Wt 183.0 lb

## 2021-07-15 DIAGNOSIS — R072 Precordial pain: Secondary | ICD-10-CM

## 2021-07-15 DIAGNOSIS — I10 Essential (primary) hypertension: Secondary | ICD-10-CM

## 2021-07-15 DIAGNOSIS — I2510 Atherosclerotic heart disease of native coronary artery without angina pectoris: Secondary | ICD-10-CM

## 2021-07-15 DIAGNOSIS — E785 Hyperlipidemia, unspecified: Secondary | ICD-10-CM

## 2021-07-15 MED ORDER — NITROGLYCERIN 0.4 MG SL SUBL
SUBLINGUAL_TABLET | SUBLINGUAL | 3 refills | Status: DC
  Filled 2021-07-15: qty 25, 7d supply, fill #0

## 2021-07-15 NOTE — Patient Instructions (Signed)
°  Follow-Up: At [facility], you and your health needs are our priority.  As part of our continuing mission to provide you with exceptional heart care, we have created designated Provider Care Teams.  These Care Teams include your primary Cardiologist (physician) and Advanced Practice Providers (APPs -  Physician Assistants and Nurse Practitioners) who all work together to provide you with the care you need, when you need it.  We recommend signing up for the patient portal called "MyChart".  Sign up information is provided on this After Visit Summary.  MyChart is used to connect with patients for Virtual Visits (Telemedicine).  Patients are able to view lab/test results, encounter notes, upcoming appointments, etc.  Non-urgent messages can be sent to your provider as well.   To learn more about what you can do with MyChart, go to [email or web address].    Your next appointment:   6 month(s)  The format for your next appointment:   In Person  Provider:   Kirk Ruths MD

## 2021-07-20 NOTE — Telephone Encounter (Signed)
For some reason the hypersensitivity panel was not done along with other labs.  We can get it drawn at his return visit as we will need to get other blood tests for liver monitoring while he is on Esbriet.  No need to do a blood draw before that

## 2021-07-22 ENCOUNTER — Encounter (HOSPITAL_COMMUNITY): Payer: Medicare Other

## 2021-07-22 ENCOUNTER — Ambulatory Visit (HOSPITAL_COMMUNITY): Payer: Medicare Other

## 2021-07-27 ENCOUNTER — Ambulatory Visit (HOSPITAL_COMMUNITY): Payer: Medicare Other

## 2021-07-27 ENCOUNTER — Encounter (HOSPITAL_COMMUNITY): Payer: Medicare Other

## 2021-07-28 ENCOUNTER — Ambulatory Visit (HOSPITAL_COMMUNITY)
Admission: RE | Admit: 2021-07-28 | Discharge: 2021-07-28 | Disposition: A | Discharge status: Home / Self Care | Payer: Medicare Other | Source: Ambulatory Visit | Attending: Internal Medicine | Admitting: Internal Medicine

## 2021-07-28 ENCOUNTER — Other Ambulatory Visit: Payer: Self-pay

## 2021-07-28 DIAGNOSIS — K219 Gastro-esophageal reflux disease without esophagitis: Secondary | ICD-10-CM

## 2021-07-28 DIAGNOSIS — R1310 Dysphagia, unspecified: Secondary | ICD-10-CM

## 2021-07-28 NOTE — Evaluation (Signed)
Modified Barium Swallow Progress Note  Patient Details  Name: ABDULAI BLAYLOCK MRN: [id number] Date of Birth: 10/05/1945  Today's Date: 07/28/2021  Modified Barium Swallow completed.  Full report located under Chart Review in the Imaging Section.  Brief recommendations include the following:  Clinical Impression  Pt demonstrates no oropharygneal dysphagia during assessment. Pt frequent clearing his throat throughout assessment though no barium entered the laryngeal vestibule. Recommend ongoing work up for chronic cough which may include referral to OP SLP for therapy to target Chronic Cough Syndrome.   Swallow Evaluation Recommendations       SLP Diet Recommendations: Regular solids;Thin liquid       Medication Administration: Whole meds with liquid   Supervision: Patient able to self feed                    Keiosha Cancro, Katherene Ponto 07/28/2021,11:56 AM

## 2021-07-29 ENCOUNTER — Other Ambulatory Visit: Payer: Self-pay | Admitting: Family Medicine

## 2021-07-29 DIAGNOSIS — R079 Chest pain, unspecified: Secondary | ICD-10-CM

## 2021-07-29 DIAGNOSIS — E785 Hyperlipidemia, unspecified: Secondary | ICD-10-CM

## 2021-08-08 ENCOUNTER — Other Ambulatory Visit: Payer: Self-pay | Admitting: Family Medicine

## 2021-08-11 ENCOUNTER — Ambulatory Visit: Payer: Medicare Other | Admitting: Pulmonary Disease

## 2021-08-12 ENCOUNTER — Ambulatory Visit (INDEPENDENT_AMBULATORY_CARE_PROVIDER_SITE_OTHER): Payer: Medicare Other | Admitting: Pulmonary Disease

## 2021-08-12 ENCOUNTER — Other Ambulatory Visit: Payer: Self-pay

## 2021-08-12 ENCOUNTER — Encounter: Payer: Self-pay | Admitting: Pulmonary Disease

## 2021-08-12 VITALS — BP 118/56 | HR 83 | Temp 98.5°F | Ht 73.0 in | Wt 183.0 lb

## 2021-08-12 DIAGNOSIS — J849 Interstitial pulmonary disease, unspecified: Secondary | ICD-10-CM

## 2021-08-12 DIAGNOSIS — Z5181 Encounter for therapeutic drug level monitoring: Secondary | ICD-10-CM | POA: Insufficient documentation

## 2021-08-12 LAB — COMPREHENSIVE METABOLIC PANEL
ALT: 18 U/L (ref 0–53)
AST: 18 U/L (ref 0–37)
Albumin: 4.3 g/dL (ref 3.5–5.2)
Alkaline Phosphatase: 65 U/L (ref 39–117)
BUN: 17 mg/dL (ref 6–23)
CO2: 31 mEq/L (ref 19–32)
Calcium: 9.1 mg/dL (ref 8.4–10.5)
Chloride: 102 mEq/L (ref 96–112)
Creatinine, Ser: 1.12 mg/dL (ref 0.40–1.50)
GFR: 64.19 mL/min (ref >60.00)
Glucose, Bld: 124 mg/dL — ABNORMAL HIGH (ref 70–99)
Potassium: 3.6 mEq/L (ref 3.5–5.1)
Sodium: 138 mEq/L (ref 135–145)
Total Bilirubin: 0.4 mg/dL (ref 0.2–1.2)
Total Protein: 6.6 g/dL (ref 6.0–8.3)

## 2021-08-12 NOTE — Patient Instructions (Signed)
I am glad you are doing well Continue the Esbriet I have reviewed your PFTs reviewed today which show mild reduction in lung function We will check a hypersensitivity panel and comprehensive metabolic panel today Will order comprehensive metabolic panel to be done at [facility] primary care [facility] for the next 2 months Return to clinic in 3 months

## 2021-08-12 NOTE — Addendum Note (Signed)
Addended by: Elton Sin on: 08/12/2021 02:36 PM   Modules accepted: Orders

## 2021-08-12 NOTE — Progress Notes (Signed)
Full PFT performed today. °

## 2021-08-12 NOTE — Progress Notes (Signed)
Russell Brown    [id number]    [birth year]  Primary Care Physician:Kremer, Mortimer Fries, MD  Referring Physician: Libby Maw, MD [place],  [place] [address]  Chief complaint: Follow-up for interstitial lung disease   HPI: 76 year old with history of COPD, asthma, GERD, hiatal hernia status post Nissen's fundoplication.  He is followed by Dr. Halford Chessman Referred for CT scan findings showing pulmonary fibrosis He has worsening dyspnea on exertion over several years, nonproductive cough.  No wheezing maintained on anoro inhaler Has severe GERD and follows with Dr. Henrene Pastor, GI.  Nissen fundoplication in 9485.  EGD in April 4627 showed fundoplication was undone.  He continues on PPI but has significant GERD symptoms.  Is due for follow-up with Dr. Henrene Pastor next month  Pets: No pets Occupation: Used to work in the [employer].  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  He also did woodworking as a hobby for many years but he did not have these exposures for the past 20 years.  He has down pillows on the living room couch Smoking history: 35-pack-year smoker.  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 He has had some episodes of dizziness and stomach ache at higher dose which appears to be subsiding.  States that breathing is stable  Outpatient Encounter Medications as of 08/12/2021  Medication Sig   albuterol (PROAIR HFA) 108 (90 Base) MCG/ACT inhaler Inhale 2 puffs into the lungs every 6 (six) hours as needed for wheezing or shortness of breath.   amoxicillin (AMOXIL) 500 MG capsule Take 500 mg by mouth every 6 (six) hours. Before dental appointment   ANORO ELLIPTA 62.5-25 MCG/ACT AEPB USE 1 INHALATION BY MOUTH   DAILY   aspirin EC 81 MG tablet Take 81 mg by mouth daily.   atorvastatin (LIPITOR) 20 MG tablet TAKE 1 TABLET BY MOUTH  DAILY   dutasteride (AVODART) 0.5 MG capsule TAKE 1 CAPSULE BY MOUTH  DAILY   fish oil-omega-3 fatty acids 1000 MG capsule Take 2 g by mouth daily.    levothyroxine (SYNTHROID) 75 MCG tablet Take 1 tablet (75 mcg total) by mouth daily before breakfast.   lisinopril-hydrochlorothiazide (ZESTORETIC) 10-12.5 MG tablet TAKE 1 TABLET BY MOUTH  DAILY   Multiple Vitamin (MULTIVITAMIN WITH MINERALS) TABS tablet Take 1 tablet by mouth daily.   nitroGLYCERIN (NITROSTAT) 0.4 MG SL tablet PLACE 1 TABLET UNDER THE TONGUE EVERY 5 MINUTES AS NEEDED   omeprazole (PRILOSEC) 40 MG capsule TAKE 1 CAPSULE BY MOUTH  TWICE DAILY   Pirfenidone 267 MG CAPS Take 267 mg by mouth in the morning, at noon, and at bedtime. (Patient taking differently: Take 267 mg by mouth in the morning, at noon, and at bedtime. Taking 1 tab 3 times a day)   sertraline (ZOLOFT) 50 MG tablet Take 1 tablet (50 mg total) by mouth daily.   No facility-administered encounter medications on file as of 08/12/2021.    Physical Exam: Blood pressure (!) 118/56, pulse 83, temperature 98.5 F (36.9 C), temperature source Oral, height 6\' 1"  (1.854 m), weight 183 lb (83 kg), SpO2 96 %. Gen:      No acute distress HEENT:  EOMI, sclera anicteric Neck:     No masses; no thyromegaly Lungs:    Clear to  auscultation bilaterally; normal respiratory effort CV:         Regular rate and rhythm; no murmurs Abd:      + bowel sounds; soft, non-tender; no palpable masses, no distension Ext:    No edema; adequate peripheral perfusion Skin:      Warm and dry; no rash Neuro: alert and oriented x 3 Psych: normal mood and affect   Data Reviewed: Imaging: High-res CT 05/12/2020-severe emphysema, irregular peripheral opacity in the lower lobes.  Indeterminate for UIP CT chest 04/30/2021-mild progression of basal fibrotic changes with  reticulation, traction bronchiectasis.  No honeycombing.  Probable UIP pattern I have reviewed the images personally.  PFTs: 05/28/2020 FVC 3.98 [83%], FEV1 2.04 [58%], F/F 51, TLC 6.97 [91%], DLCO 13.73 [49%]  08/12/2020 FVC 3.68 [77%], FEV1 2.00 [57%], F/F 54, TLC 5.72 [74%], DLCO 13.26 [48%] Moderate restriction, severe diffusion defect  Labs: ANA, rheumatoid factor 07/04/20-negative  Assessment:  Interstitial lung disease, IPF I have reviewed his scans dating back to 2006.  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  He does also have ongoing GERD and reflux which is likely contributing to progression.  There is remote exposures to Aspergillus and wood dust but not recently so I do not believe this is the reason for his progressive pulmonary fibrosis. Labs show borderline elevation in ANA which is nonspecific  Continue Esbriet, monitor LFTs He will follow-up with GI to get his reflux under control and also get rid of his down pillows  Plan/Recommendations: Continue Esbriet, monitor LFTs  Marshell Garfinkel MD [facility] 08/12/2021, 2:14 PM  CC: Libby Maw,*

## 2021-08-12 NOTE — Patient Instructions (Signed)
Full PFT performed today. °

## 2021-08-17 LAB — HYPERSENSITIVITY PNEUMONITIS
A. Pullulans Abs: NEGATIVE
A.Fumigatus #1 Abs: NEGATIVE
Micropolyspora faeni, IgG: NEGATIVE
Pigeon Serum Abs: NEGATIVE
Thermoact. Saccharii: NEGATIVE
Thermoactinomyces vulgaris, IgG: NEGATIVE

## 2021-08-24 ENCOUNTER — Encounter: Payer: Self-pay | Admitting: Pulmonary Disease

## 2021-08-24 ENCOUNTER — Encounter: Payer: Self-pay | Admitting: Internal Medicine

## 2021-08-24 DIAGNOSIS — J849 Interstitial pulmonary disease, unspecified: Secondary | ICD-10-CM

## 2021-08-24 LAB — PULMONARY FUNCTION TEST
DL/VA % pred: 60 %
DL/VA: 2.37 ml/min/mmHg/L
DLCO cor % pred: 48 %
DLCO cor: 13.26 ml/min/mmHg
DLCO unc % pred: 48 %
DLCO unc: 13.26 ml/min/mmHg
FEF 25-75 Post: 1.04 L/sec
FEF 25-75 Pre: 0.83 L/sec
FEF2575-%Change-Post: 26 %
FEF2575-%Pred-Post: 41 %
FEF2575-%Pred-Pre: 32 %
FEV1-%Change-Post: 7 %
FEV1-%Pred-Post: 57 %
FEV1-%Pred-Pre: 53 %
FEV1-Post: 2 L
FEV1-Pre: 1.85 L
FEV1FVC-%Change-Post: 3 %
FEV1FVC-%Pred-Pre: 72 %
FEV6-%Change-Post: 5 %
FEV6-%Pred-Post: 82 %
FEV6-%Pred-Pre: 77 %
FEV6-Post: 3.68 L
FEV6-Pre: 3.48 L
FEV6FVC-%Change-Post: 1 %
FEV6FVC-%Pred-Post: 106 %
FEV6FVC-%Pred-Pre: 104 %
FVC-%Change-Post: 4 %
FVC-%Pred-Post: 77 %
FVC-%Pred-Pre: 74 %
FVC-Post: 3.68 L
FVC-Pre: 3.53 L
Post FEV1/FVC ratio: 54 %
Post FEV6/FVC ratio: 100 %
Pre FEV1/FVC ratio: 53 %
Pre FEV6/FVC Ratio: 99 %
RV % pred: 61 %
RV: 1.67 L
TLC % pred: 74 %
TLC: 5.72 L

## 2021-08-25 ENCOUNTER — Ambulatory Visit (AMBULATORY_SURGERY_CENTER): Payer: Medicare Other | Admitting: Internal Medicine

## 2021-08-25 ENCOUNTER — Other Ambulatory Visit: Payer: Self-pay

## 2021-08-25 ENCOUNTER — Encounter: Payer: Self-pay | Admitting: Internal Medicine

## 2021-08-25 VITALS — BP 125/74 | HR 51 | Temp 97.8°F | Resp 24 | Ht 73.0 in | Wt 183.0 lb

## 2021-08-25 DIAGNOSIS — R1310 Dysphagia, unspecified: Secondary | ICD-10-CM | POA: Diagnosis not present

## 2021-08-25 DIAGNOSIS — K219 Gastro-esophageal reflux disease without esophagitis: Secondary | ICD-10-CM | POA: Diagnosis not present

## 2021-08-25 DIAGNOSIS — K449 Diaphragmatic hernia without obstruction or gangrene: Secondary | ICD-10-CM

## 2021-08-25 MED ORDER — SODIUM CHLORIDE 0.9 % IV SOLN: 500.0000 mL | Freq: Once | INTRAVENOUS | Status: DC

## 2021-08-25 MED ORDER — PIRFENIDONE 801 MG PO TABS: 801.0000 mg | ORAL_TABLET | Freq: Three times a day (TID) | ORAL | 1 refills | Status: DC

## 2021-08-25 NOTE — Op Note (Signed)
[place] ?Patient Name: Russell Brown ?Procedure Date: 08/25/2021 8:42 AM ?MRN: [id number] ?Endoscopist: Docia Chuck. Henrene Pastor , MD ?Age: 76 ?Referring MD:  ?Date of Birth: [birth year] ?Gender: Male ?Account #: [id number] ?Procedure:                Upper GI endoscopy ?Indications:              Dysphagia (vague) negative modified barium swallow,  ?                          Esophageal reflux. States he is doing better on  ?                          current medical regimen without reflux symptoms ?Medicines:                Monitored Anesthesia Care ?Procedure:                Pre-Anesthesia Assessment: ?                          - Prior to the procedure, a History and Physical  ?                          was performed, and patient medications and  ?                          allergies were reviewed. The patient's tolerance of  ?                          previous anesthesia was also reviewed. The risks  ?                          and benefits of the procedure and the sedation  ?                          options and risks were discussed with the patient.  ?                          All questions were answered, and informed consent  ?                          was obtained. Prior Anticoagulants: The patient has  ?                          taken no previous anticoagulant or antiplatelet  ?                          agents. ASA Grade Assessment: II - A patient with  ?                          mild systemic disease. After reviewing the risks  ?                          and benefits, the patient was deemed in  ?  satisfactory condition to undergo the procedure. ?                          After obtaining informed consent, the endoscope was  ?                          passed under direct vision. Throughout the  ?                          procedure, the patient's blood pressure, pulse, and  ?                          oxygen saturations were monitored continuously. The  ?                          GIF HQ190  #[id number] was introduced through the  ?                          mouth, and advanced to the second part of duodenum.  ?                          The upper GI endoscopy was accomplished without  ?                          difficulty. The patient tolerated the procedure  ?                          well. ?Scope In: ?Scope Out: ?Findings:                 The esophagus was normal. ?                          The stomach was normal save small hiatal hernia. ?                          The examined duodenum was normal. ?                          The cardia and gastric fundus were normal on  ?                          retroflexion. ?Complications:            No immediate complications. ?Estimated Blood Loss:     Estimated blood loss: none. ?Impression:               - Normal esophagus. ?                          - Normal stomach. Small hiatal hernia. ?                          - Normal examined duodenum. ?                          - No specimens collected. ?Recommendation:           -  Patient has a contact number available for  ?                          emergencies. The signs and symptoms of potential  ?                          delayed complications were discussed with the  ?                          patient. Return to normal activities tomorrow.  ?                          Written discharge instructions were provided to the  ?                          patient. ?                          - Resume previous diet. ?                          - Continue present medications. ?                          -Return to the care of your primary provider and  ?                          other specialists ?Docia Chuck. Henrene Pastor, MD ?08/25/2021 9:08:35 AM ?This report has been signed electronically. ?

## 2021-08-25 NOTE — Patient Instructions (Addendum)
?  Handout on GERD and hiatal hernia given to you today ? ? ?YOU HAD AN ENDOSCOPIC PROCEDURE TODAY AT [facility]:   Refer to the procedure report that was given to you for any specific questions about what was found during the examination.  If the procedure report does not answer your questions, please call your gastroenterologist to clarify.  If you requested that your care partner not be given the details of your procedure findings, then the procedure report has been included in a sealed envelope for you to review at your convenience later. ? ?YOU SHOULD EXPECT: Some feelings of bloating in the abdomen. Passage of more gas than usual.  Walking can help get rid of the air that was put into your GI tract during the procedure and reduce the bloating. If you had a lower endoscopy (such as a colonoscopy or flexible sigmoidoscopy) you may notice spotting of blood in your stool or on the toilet paper. If you underwent a bowel prep for your procedure, you may not have a normal bowel movement for a few days. ? ?Please Note:  You might notice some irritation and congestion in your nose or some drainage.  This is from the oxygen used during your procedure.  There is no need for concern and it should clear up in a day or so. ? ?SYMPTOMS TO REPORT IMMEDIATELY: ? ?Following upper endoscopy (EGD) ? Vomiting of blood or coffee ground material ? New chest pain or pain under the shoulder blades ? Painful or persistently difficult swallowing ? New shortness of breath ? Fever of 100?F or higher ? Black, tarry-looking stools ? ?For urgent or emergent issues, a gastroenterologist can be reached at any hour by calling [phone]. ?Do not use MyChart messaging for urgent concerns.  ? ? ?DIET:  We do recommend a small meal at first, but then you may proceed to your regular diet.  Drink plenty of fluids but you should avoid alcoholic beverages for 24 hours. ? ?ACTIVITY:  You should plan to take it easy for the rest of  today and you should NOT DRIVE or use heavy machinery until tomorrow (because of the sedation medicines used during the test).   ? ?FOLLOW UP: ?Our staff will call the number listed on your records 48-72 hours following your procedure to check on you and address any questions or concerns that you may have regarding the information given to you following your procedure. If we do not reach you, we will leave a message.  We will attempt to reach you two times.  During this call, we will ask if you have developed any symptoms of COVID 19. If you develop any symptoms (ie: fever, flu-like symptoms, shortness of breath, cough etc.) before then, please call [phone].  If you test positive for Covid 19 in the 2 weeks post procedure, please call and report this information to [place].   ? ?If any biopsies were taken you will be contacted by phone or by letter within the next 1-3 weeks.  Please call us at [phone] if you have not heard about the biopsies in 3 weeks.  ? ? ?SIGNATURES/CONFIDENTIALITY: ?You and/or your care partner have signed paperwork which will be entered into your electronic medical record.  These signatures attest to the fact that that the information above on your After Visit Summary has been reviewed and is understood.  Full responsibility of the confidentiality of this discharge information lies with you and/or your care-partner.  ?

## 2021-08-25 NOTE — Telephone Encounter (Signed)
Rx for Esbriet '801mg'$  three times daily sent to [facility]. Patient changed to higher strength tablet to reduce pill burden. ? ?Knox Saliva, PharmD, MPH, BCPS ?Clinical Pharmacist (Rheumatology and Pulmonology) ?

## 2021-08-25 NOTE — Progress Notes (Signed)
Sedate, gd SR, tolerated procedure well, VSS, report to RN 

## 2021-08-25 NOTE — Progress Notes (Signed)
HISTORY OF PRESENT ILLNESS: ?  ?Russell Brown is a 76 y.o. male with multiple medical problems including hypertension, hyperlipidemia, hypothyroidism, idiopathic pulmonary fibrosis, and GERD.  Patient sent today regarding chronic GERD, coughing or choking spells when eating, and concerns over the initiation of Esbriet therapy due to a high GI side effect profile.  Patient has been seen in this office for screening colonoscopy which he underwent in 2003 and again in 2014.  Both examinations were negative for neoplasia.  Left-sided diverticulosis noted.  He also underwent upper endoscopy in 2019 to evaluate chronic hoarseness.  The examination was normal.  He does have a history of GERD and remote fundoplication which was noted to have a slipped wrap on the recent upper endoscopy.  He tells me that he has been diagnosed with pulmonary fibrosis.  Tells me that his pulmonologist has suggested that his lung problem is either caused by or exacerbated by reflux disease.  Patient does tell me at night he will have regurgitation episodes with coughing and throat burning.  He also describes more frequent problems with swallowing solids but mostly liquids that resulting coughing spells.  He does not clearly describe true esophageal dysphagia but rather oropharyngeal dysphagia.  For breakthrough reflux symptoms he uses Gaviscon.  He is accompanied today by his wife.  Review of blood work from November 2022 shows unremarkable comprehensive metabolic panel and CBC with hemoglobin 15.6.  CT scan of the chest November 2022 revealed pulmonary abnormalities and coronary calcification.  As well moderate hiatal hernia.  GI review of systems is otherwise negative ?  ?REVIEW OF SYSTEMS: ?  ?All non-GI ROS negative unless otherwise stated in the HPI except for shortness of breath ?  ?    ?Past Medical History:  ?Diagnosis Date  ? Arthritis    ?  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 Asthma    ?  pt has  albuteral inhaler.  ? COPD (chronic obstructive pulmonary disease) ([place])    ? GERD (gastroesophageal reflux disease)    ? History of hiatal hernia    ? Hyperlipemia    ? Hypertension    ? Hypothyroidism    ? Pneumonia 2013; 2017  ? PTSD (post-traumatic stress disorder)    ?  retired Engineer, structural   ? Pulmonary fibrosis ([place])    ? Sleep apnea    ?  mild - but does not wear a c-pap per pt  ? Thyroid disease    ?  thyroid nodules - half of thyroid was removed per pt  ?  ?  ?     ?Past Surgical History:  ?Procedure Laterality Date  ? APPENDECTOMY      ? BACK SURGERY      ? CARDIAC CATHETERIZATION   1980s  ?  "no blockage at that time" (02/20/2018)  ? CERVICAL SPINE SURGERY      ?  "enlarged hole that nerves went thru"  ? FINGER AMPUTATION Left 2004  ?  2nd and 3rd digits; "table saw injury"  ? FOOT FRACTURE SURGERY Left    ?  "heel OR; put 2 steel rods in and took them out 6 wks later"  ? FRACTURE SURGERY      ? HERNIA REPAIR Right 1965  ? LEFT HEART CATH AND CORONARY ANGIOGRAPHY N/A 02/21/2018  ?  Procedure: LEFT HEART CATH AND CORONARY ANGIOGRAPHY;  Surgeon: Troy Sine, MD;  Location: [facility];  Service: Cardiovascular;  Laterality:  N/A;  ? NASAL SEPTUM SURGERY   1986  ? NISSEN FUNDOPLICATION   1610R  ?  "w/hernia repair"  ? THYROIDECTOMY, PARTIAL   2000s  ? WISDOM TOOTH EXTRACTION      ?  ?  ?Social History ?Henrene Dodge  reports that he quit smoking about 24 years ago. His smoking use included cigarettes. He has a 35.00 pack-year smoking history. He has never used smokeless tobacco. He reports current alcohol use of about 7.0 standard drinks per week. He reports that he does not use drugs. ?  ?family history includes Alzheimer's disease in his paternal uncle; Asthma in his son; Cancer in his father and maternal grandmother; Heart disease in his paternal grandfather and paternal grandmother; Hyperlipidemia in his father; Hypertension in his father; Osteoporosis in his maternal grandmother, mother,  and paternal aunt; Parkinsonism in his mother; Pneumonia in his mother; Prostate cancer in his father. ?  ?     ?Allergies  ?Allergen Reactions  ? Codeine Anxiety  ?    Reaction if taken for extended periods of time.  ?  ?  ?  ?  ?PHYSICAL EXAMINATION: ?Vital signs: BP 100/70   Pulse 65   Ht '6\' 1"'$  (1.854 m)   Wt 183 lb (83 kg)   BMI 24.14 kg/m?   ?Constitutional: generally well-appearing, no acute distress ?Psychiatric: alert and oriented x3, cooperative ?Eyes: extraocular movements intact, anicteric, conjunctiva pink ?Mouth: oral pharynx moist, no lesions ?Neck: supple no lymphadenopathy ?Cardiovascular: heart regular rate and rhythm, no murmur ?Lungs: clear to auscultation bilaterally ?Abdomen: soft, nontender, nondistended, no obvious ascites, no peritoneal signs, normal bowel sounds, no organomegaly ?Rectal: Omitted ?Extremities: no lower extremity edema bilaterally, the missing 2 digits on left hand ?Skin: no lesions on visible extremities ?Neuro: No focal deficits.  Cranial nerves intact ?  ?ASSESSMENT: ?  ?1.  GERD.  Remote history of fundoplication.  Unwrapped on 2019 EGD.  Some breakthrough symptoms despite omeprazole 20 mg twice daily.  Hiatal hernia on CT. ?2.  Coughing and choking spells with meals, particularly liquids.  Rule out oropharyngeal dysphagia/aspiration ?3.  Negative for neoplasia screening colonoscopy 2003 and 2014.  Incidental diverticulosis ?4.  Multiple medical problems including IPF ?  ?PLAN: ?  ?1.  Reflux precautions.  Reviewed.  Avoid late meals.  Elevate head of bed. ?2.  Continue twice daily PPI ?3.  May need to add at night H2 receptor antagonist ?4.  Schedule upper endoscopy to evaluate worsening reflux symptoms and vague dysphagia.The nature of the procedure, as well as the risks, benefits, and alternatives were carefully and thoroughly reviewed with the patient. Ample time for discussion and questions allowed. The patient understood, was satisfied, and agreed to proceed.   ?5.  Schedule modified barium swallow with speech pathology to rule out oropharyngeal dysphagia and aspiration.  If abnormal, the speech pathologist may be able to provide recommendations such as chin tuck maneuver and/or adding thickener to liquids. ?6.  Additional recommendations and the need for follow-up to be determined ? ? ?The patient was evaluated in the office for reflux disease and atypical dysphagia.  The full H&P as outlined above.  He did undergo modified barium swallow with speech pathology.  No oropharyngeal dysphagia was identified.  He has had no interval clinical changes.  He is now for upper endoscopy. ?  ?

## 2021-08-27 ENCOUNTER — Telehealth: Payer: Self-pay | Admitting: *Deleted

## 2021-08-27 NOTE — Telephone Encounter (Signed)
?  Follow up Call- ? ?Call back number 08/25/2021  ?Post procedure Call Back phone  # [phone]  ?Permission to leave phone message Yes  ?Some recent data might be hidden  ?  ? ?Patient questions: ? ?Do you have a fever, pain , or abdominal swelling? No. ?Pain Score  0 * ? ?Have you tolerated food without any problems? Yes.   ? ?Have you been able to return to your normal activities? Yes.   ? ?Do you have any questions about your discharge instructions: ?Diet   No. ?Medications  No. ?Follow up visit  No. ? ?Do you have questions or concerns about your Care? No. ? ?Actions: ?* If pain score is 4 or above: ?No action needed, pain <4. ? ? ?

## 2021-09-14 ENCOUNTER — Encounter: Payer: Self-pay | Admitting: Pulmonary Disease

## 2021-09-14 NOTE — Telephone Encounter (Signed)
"  HAVE BEEN TAKING ?ESBRIET? FOR OVER TWO MONTHS NOW AND HAVE BEEN BATTLING NAUSEA THE ENTIRE TIME HOPING THAT IT WOULD EVENTUALLY SUBSIDE - IT IS STILL BAD ENOUGH THAT I DREAD EATING ANYTHING AND AM HAVING TO FORCE MYSELF TO EAT IN ORDER TO BE ABLE TO TAKE THE MEDICATION - THE [place] PHARMACIST WHO CALLED ME MONTHS AGO SAID THAT THEY COULD PRESCRIBE AN ANTI-NAUSEA MEDICATION TO COUNTERACT THE NAUSEA - PLEASE FORWARD A PRESCRIPTION WHICH IS COMPATIBLE WITH ?ESBRIET? TO [facility] FOR A 90-DAY SUPPLY SO I CAN CONTINUE TO TAKE THIS MEDICATION - THANKS SINCERELY!" ? ? ?Dr. Vaughan Browner please advise  ?

## 2021-09-15 ENCOUNTER — Other Ambulatory Visit: Payer: Self-pay | Admitting: Family Medicine

## 2021-09-15 DIAGNOSIS — F4310 Post-traumatic stress disorder, unspecified: Secondary | ICD-10-CM

## 2021-09-16 ENCOUNTER — Other Ambulatory Visit: Payer: Self-pay

## 2021-09-16 ENCOUNTER — Other Ambulatory Visit (INDEPENDENT_AMBULATORY_CARE_PROVIDER_SITE_OTHER): Payer: Medicare Other

## 2021-09-16 ENCOUNTER — Encounter: Payer: Self-pay | Admitting: Pulmonary Disease

## 2021-09-16 DIAGNOSIS — Z5181 Encounter for therapeutic drug level monitoring: Secondary | ICD-10-CM

## 2021-09-16 LAB — COMPREHENSIVE METABOLIC PANEL
ALT: 21 U/L (ref 0–53)
AST: 19 U/L (ref 0–37)
Albumin: 4.8 g/dL (ref 3.5–5.2)
Alkaline Phosphatase: 79 U/L (ref 39–117)
BUN: 16 mg/dL (ref 6–23)
CO2: 30 mEq/L (ref 19–32)
Calcium: 9.5 mg/dL (ref 8.4–10.5)
Chloride: 104 mEq/L (ref 96–112)
Creatinine, Ser: 0.99 mg/dL (ref 0.40–1.50)
GFR: 74.38 mL/min (ref >60.00)
Glucose, Bld: 115 mg/dL — ABNORMAL HIGH (ref 70–99)
Potassium: 4.4 mEq/L (ref 3.5–5.1)
Sodium: 145 mEq/L (ref 135–145)
Total Bilirubin: 0.4 mg/dL (ref 0.2–1.2)
Total Protein: 7 g/dL (ref 6.0–8.3)

## 2021-09-16 MED ORDER — ONDANSETRON HCL 4 MG PO TABS: 4.0000 mg | ORAL_TABLET | Freq: Three times a day (TID) | ORAL | 3 refills | Status: DC | PRN

## 2021-09-16 NOTE — Progress Notes (Signed)
Per the orders of  Dr.Mannam,( [facility])  pt is here for labs, pt tolerated draw well. ? ?

## 2021-09-16 NOTE — Telephone Encounter (Signed)
Mychart message sent by pt: ?Russell Brown "DON"  P [facility] (supporting Mannam, Praveen, MD) 4 hours ago (10:50 AM)  ? ?Mar 27, 8:51 AM ?I HAVE BEEN TAKING ?ESBRIET? FOR OVER TWO MONTHS NOW AND HAVE BEEN BATTLING NAUSEA THE ENTIRE TIME HOPING THAT IT WOULD EVENTUALLY SUBSIDE - IT IS STILL BAD ENOUGH THAT I DREAD EATING ANYTHING AND AM HAVING TO FORCE MYSELF TO EAT IN ORDER TO BE ABLE TO TAKE THE MEDICATION - THE [place] PHARMACIST WHO CALLED ME MONTHS AGO SAID THAT THEY COULD PRESCRIBE AN ANTI-NAUSEA MEDICATION TO COUNTERACT THE NAUSEA - PLEASE FORWARD A PRESCRIPTION WHICH IS COMPATIBLE WITH ?ESBRIET? TO [facility] FOR A 90-DAY SUPPLY SO I CAN CONTINUE TO TAKE THIS MEDICATION - THANKS SINCERELY! ? ? ? ?Dr. Vaughan Browner, please advise. ?

## 2021-09-16 NOTE — Telephone Encounter (Signed)
Duplicate encounter. Prior encounter sent back to triage so closing this one. ?

## 2021-09-16 NOTE — Telephone Encounter (Signed)
Please send in prescription for Zofran 4 mg.  Take every 8 hours as needed for nausea. ?

## 2021-09-22 ENCOUNTER — Encounter: Payer: Self-pay | Admitting: Pulmonary Disease

## 2021-09-22 MED ORDER — ONDANSETRON HCL 4 MG PO TABS: 4.0000 mg | ORAL_TABLET | Freq: Three times a day (TID) | ORAL | 3 refills | Status: DC | PRN

## 2021-09-25 IMAGING — CT CT CHEST HIGH RESOLUTION W/O CM
2 of 7 series · 15 of 36 positions shown, 18 images · non-contrast
Comparison: 04/29/2005

CLINICAL DATA: Shortness of breath

EXAM:
CT CHEST WITHOUT CONTRAST
TECHNIQUE: Multidetector CT imaging of the chest was performed following the
standard protocol without intravenous contrast. High resolution
imaging of the lungs, as well as inspiratory and expiratory imaging,
was performed.

[Series 2: thorax · axial · 0.77mm/px · z∈[-362,-56]mm · 12 of 173 slices shown, 15 images]
[im 10/173  mediastinal]
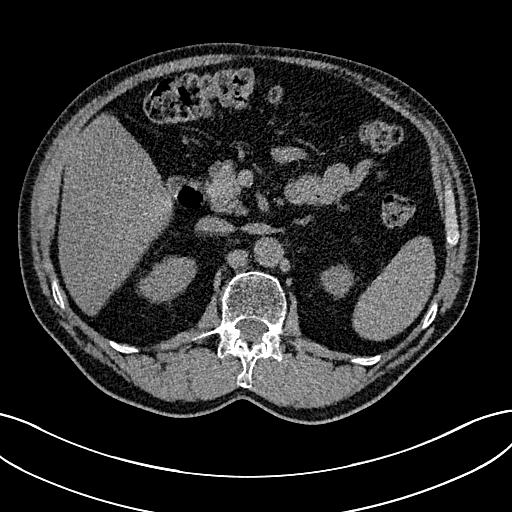
[im 10/173  lung]
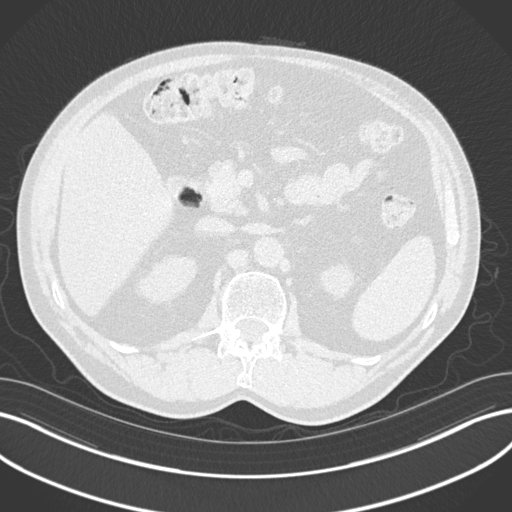
[im 28/173  lung]
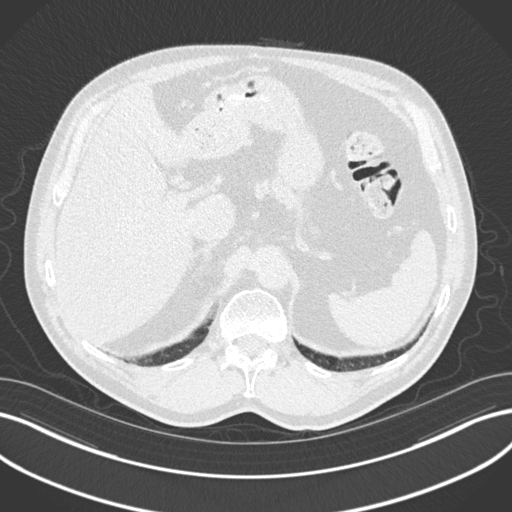
[im 37/173  lung]
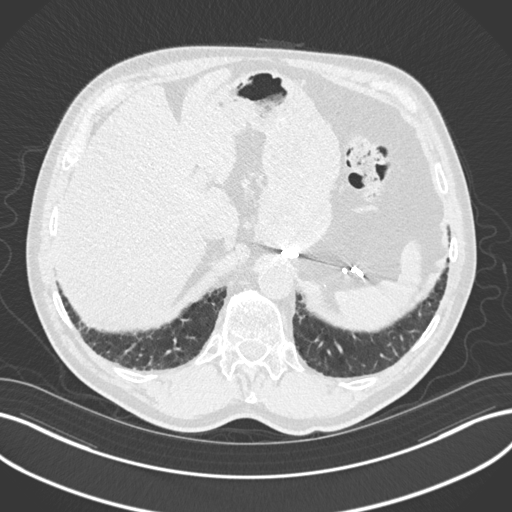
[im 55/173  lung]
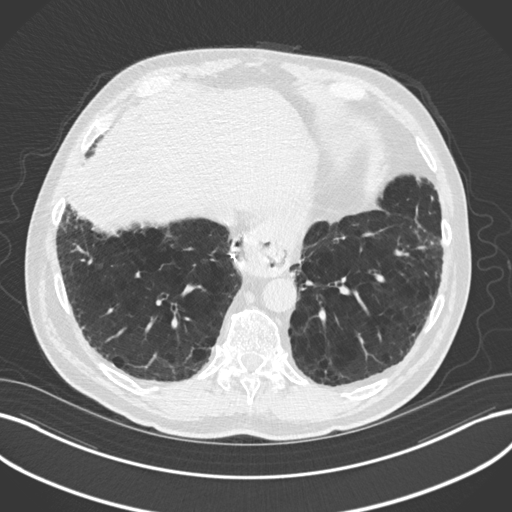
[im 64/173  mediastinal]
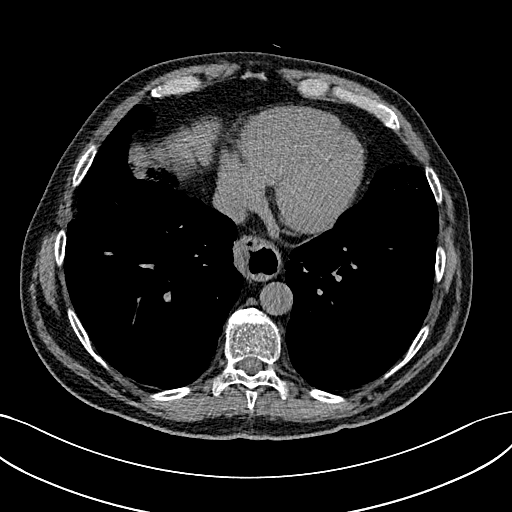
[im 64/173  lung]
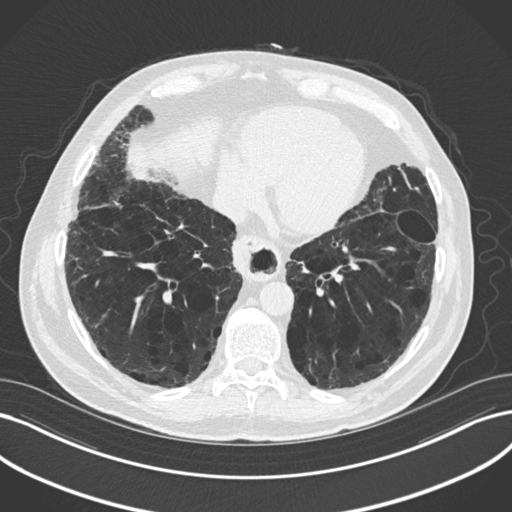
[im 82/173  lung]
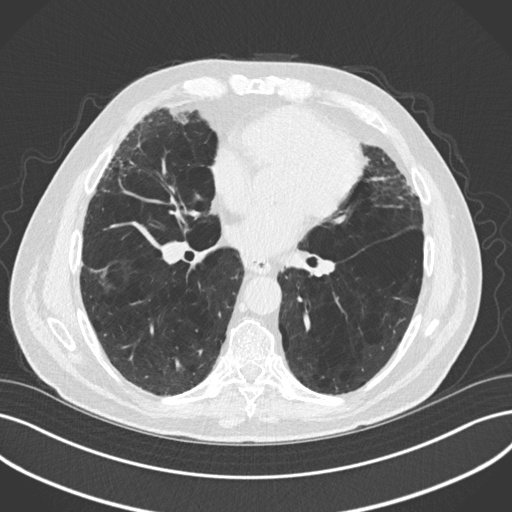
[im 91/173  lung]
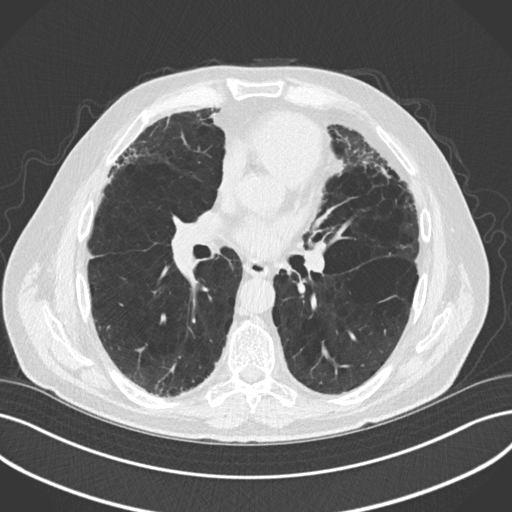
[im 109/173  lung]
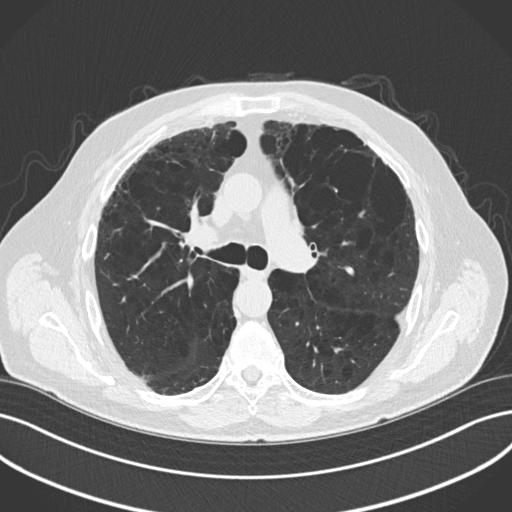
[im 118/173  mediastinal]
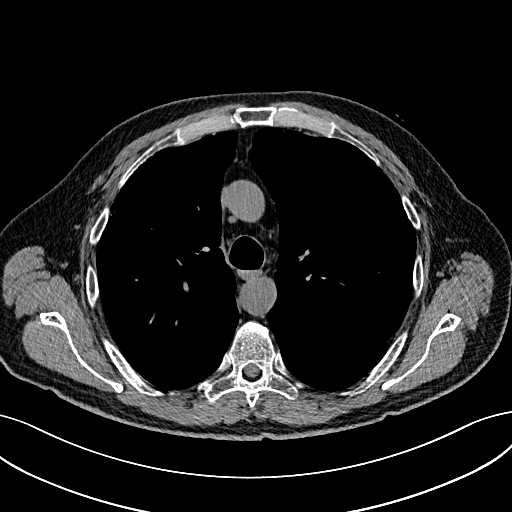
[im 118/173  lung]
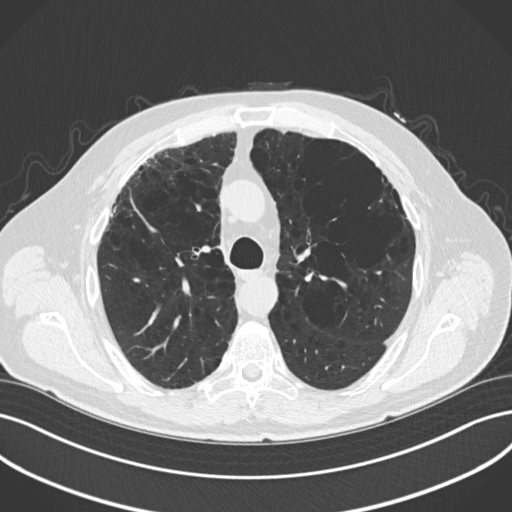
[im 136/173  lung]
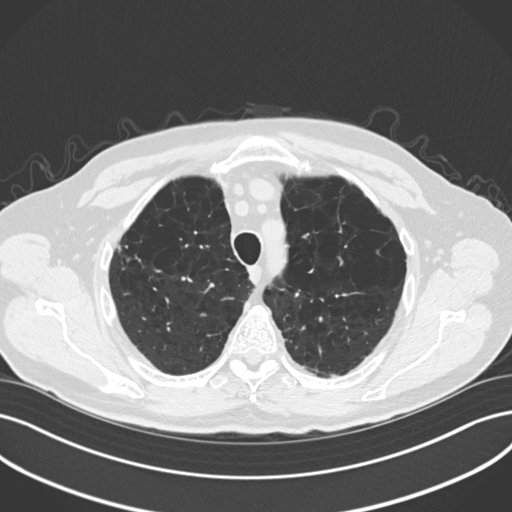
[im 145/173  lung]
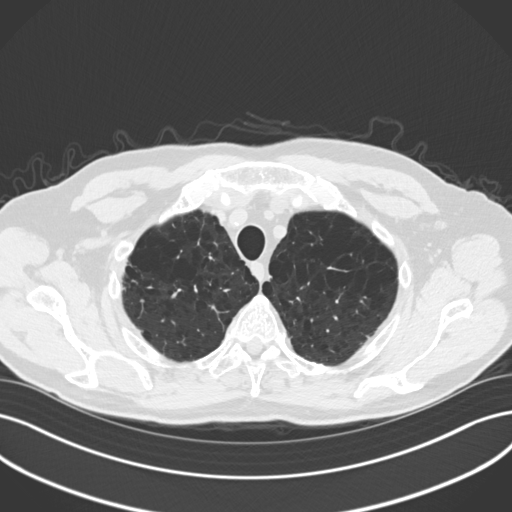
[im 163/173  lung]
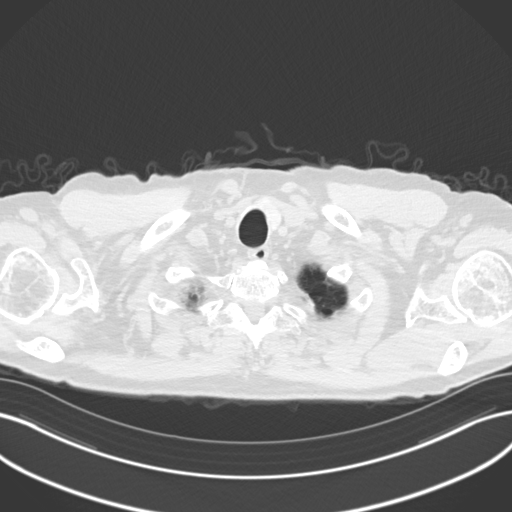

[Series 6: coronal · coronal · 0.70mm/px · 3 of 161 slices shown]
[im 33/161  lung]
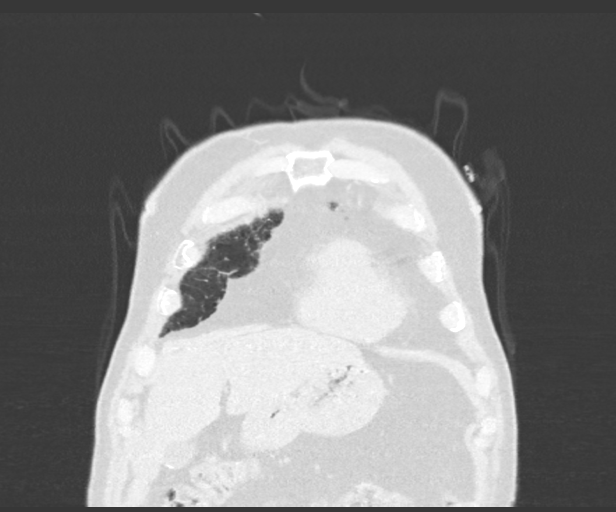
[im 65/161  lung]
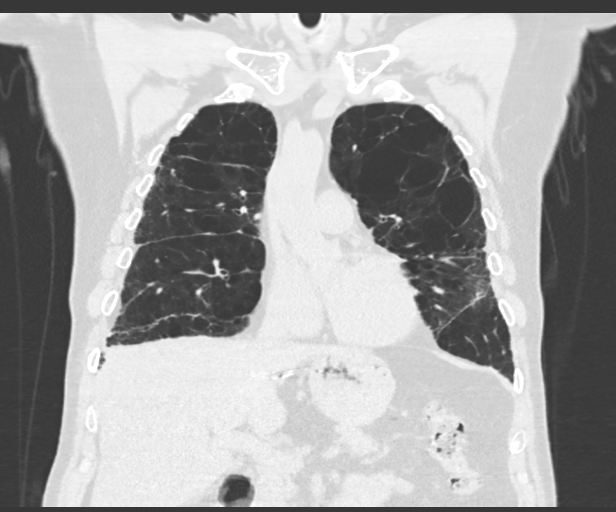
[im 97/161  lung]
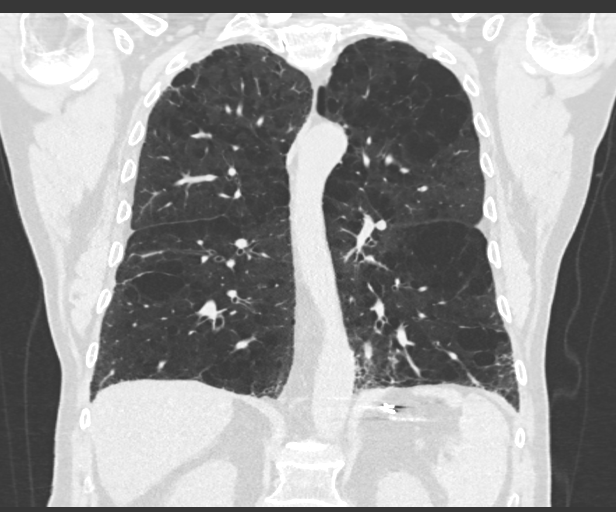

[15 of 36 positions shown; findings below may reference images not displayed]

FINDINGS: Cardiovascular: Scattered aortic atherosclerosis. Normal heart size.
Scattered coronary artery calcifications. No pericardial effusion.

Mediastinum/Nodes: No enlarged mediastinal, hilar, or axillary lymph
nodes. Small hiatal hernia. The right lobe of the thyroid is
surgically absent. Trachea, and esophagus demonstrate no significant
findings.

Lungs/Pleura: Severe centrilobular emphysema, worsened compared to
prior examination. There is mild irregular peripheral interstitial
opacity throughout the lungs, most conspicuous in the lung bases and
new compared to prior examination. No significant air trapping on
expiratory phase imaging. No pleural effusion or pneumothorax.

Upper Abdomen: No acute abnormality.  Hepatic steatosis.

Musculoskeletal: No chest wall mass or suspicious bone lesions
identified.
IMPRESSION: 1. Severe centrilobular emphysema.
2. There is mild irregular peripheral interstitial opacity
throughout the lungs, most conspicuous in the lung bases and new
compared to remote prior examination dated 2990. No significant air
trapping on expiratory phase imaging. Findings are suggestive of
mild pulmonary fibrosis although generally nonspecific and may
reflect sequelae of prior infection or inflammation. If
characterized by ATS pulmonary fibrosis criteria, these findings are
in an "indeterminate for UIP" pattern. Consider follow-up ILD
protocol CT in 1 year to assess for stability of fibrotic findings
and pattern if there is high clinical concern for fibrotic
interstitial lung disease.
3. Coronary artery disease.
4. Hiatal hernia.
5. Hepatic steatosis.

Aortic Atherosclerosis (QI6F6-H9E.E) and Emphysema (QI6F6-NRN.M).

## 2021-10-10 ENCOUNTER — Encounter: Payer: Self-pay | Admitting: Pulmonary Disease

## 2021-10-12 ENCOUNTER — Telehealth: Payer: Self-pay | Admitting: Pharmacist

## 2021-10-12 NOTE — Telephone Encounter (Signed)
Please see attached letter from pt that is in review media section of mychart message. ?

## 2021-10-12 NOTE — Telephone Encounter (Signed)
Patient sent letter to [facility] patient foundation along with 2 unopened Esbriet bottles. ? ?His letter stated: ?ENCLOSED ARE TWO UNOPENED FACTORY-SEALED BOXES ?CONTAINING ESBRIET 801 MG TABLETS -  ? ?UNFORTUNATELY FOR ME, MY BODY WILL NOT ACCEPT THIS MEDICATION AS THE SIDE EFFECTS KEPT GETTING WORSE ?INSTEAD OF SUBSIDING AFTER THREE MONTHS OF EFFORT - ? ?I HAD NAUSEA WHICH WAS WORSENING, DIZZINESS, LOSS OF APPETITE, AND LOSS OF TASTE WHICH I HOPE WILL RETURN SINCE I HAVE DISCONTIMUED THE MEDICATION -  ? ?THE MEDICATION MY PULMONOLOGIST PRESCRIBED FOR THE NAUSEA CAUSED EVEN MORE UNPLEASANT SIDE EFFECTS - ? ?Spoke with patient to determine if he would consider low-dose Esbriet (2 tabs three times daily). He states he had nausea since the first week he took the medication and it progressively worsened until he was forcing himself to eat with each dose. He states it significantly affected his quality of life. I did review that Dr. Vaughan Browner may discuss Ofev with him at his f/u visit in May 2023 which is more likely to cause diarrhea. He verbalized understanding. ? ?Removed pirfenidone from med list today. ? ?Knox Saliva, PharmD, MPH, BCPS ?Clinical Pharmacist (Rheumatology and Pulmonology) ?

## 2021-11-09 ENCOUNTER — Ambulatory Visit: Payer: Medicare Other | Admitting: Pulmonary Disease

## 2022-03-05 ENCOUNTER — Telehealth: Payer: Self-pay | Admitting: Family Medicine

## 2022-03-05 NOTE — Telephone Encounter (Signed)
Pt has scheduled himself a TOC through mychart, he is wanting to change his PCP from Dr. Ethelene Hal to [place]. He said his wife loves [facility], so he would like to start seeing who she see's. If this is an issue let me know.

## 2022-03-13 ENCOUNTER — Other Ambulatory Visit: Payer: Self-pay | Admitting: Pulmonary Disease

## 2022-03-18 ENCOUNTER — Encounter: Payer: Self-pay | Admitting: Pulmonary Disease

## 2022-04-08 ENCOUNTER — Ambulatory Visit (INDEPENDENT_AMBULATORY_CARE_PROVIDER_SITE_OTHER): Payer: Medicare Other | Admitting: Pulmonary Disease

## 2022-04-08 ENCOUNTER — Encounter: Payer: Self-pay | Admitting: Pulmonary Disease

## 2022-04-08 VITALS — BP 116/64 | HR 77 | Temp 98.0°F | Ht 73.0 in | Wt 181.6 lb

## 2022-04-08 DIAGNOSIS — R0609 Other forms of dyspnea: Secondary | ICD-10-CM

## 2022-04-08 DIAGNOSIS — J849 Interstitial pulmonary disease, unspecified: Secondary | ICD-10-CM

## 2022-04-08 DIAGNOSIS — Z5181 Encounter for therapeutic drug level monitoring: Secondary | ICD-10-CM

## 2022-04-08 LAB — COMPREHENSIVE METABOLIC PANEL
ALT: 20 U/L (ref 0–53)
AST: 18 U/L (ref 0–37)
Albumin: 4.3 g/dL (ref 3.5–5.2)
Alkaline Phosphatase: 75 U/L (ref 39–117)
BUN: 14 mg/dL (ref 6–23)
CO2: 29 mEq/L (ref 19–32)
Calcium: 9.1 mg/dL (ref 8.4–10.5)
Chloride: 103 mEq/L (ref 96–112)
Creatinine, Ser: 1.04 mg/dL (ref 0.40–1.50)
GFR: 69.84 mL/min (ref >60.00)
Glucose, Bld: 90 mg/dL (ref 70–99)
Potassium: 4.2 mEq/L (ref 3.5–5.1)
Sodium: 138 mEq/L (ref 135–145)
Total Bilirubin: 0.5 mg/dL (ref 0.2–1.2)
Total Protein: 6.7 g/dL (ref 6.0–8.3)

## 2022-04-08 LAB — CBC WITH DIFFERENTIAL/PLATELET
Basophils Absolute: 0 10*3/uL (ref 0.0–0.1)
Basophils Relative: 0.5 % (ref 0.0–3.0)
Eosinophils Absolute: 0.3 10*3/uL (ref 0.0–0.7)
Eosinophils Relative: 3.7 % (ref 0.0–5.0)
HCT: 46.7 % (ref 39.0–52.0)
Hemoglobin: 15.4 g/dL (ref 13.0–17.0)
Lymphocytes Relative: 16.3 % (ref 12.0–46.0)
Lymphs Abs: 1.2 10*3/uL (ref 0.7–4.0)
MCHC: 33 g/dL (ref 30.0–36.0)
MCV: 90.4 fl (ref 78.0–100.0)
Monocytes Absolute: 0.6 10*3/uL (ref 0.1–1.0)
Monocytes Relative: 9.2 % (ref 3.0–12.0)
Neutro Abs: 5 10*3/uL (ref 1.4–7.7)
Neutrophils Relative %: 70.3 % (ref 43.0–77.0)
Platelets: 203 10*3/uL (ref 150.0–400.0)
RBC: 5.17 Mil/uL (ref 4.22–5.81)
RDW: 13.4 % (ref 11.5–15.5)
WBC: 7.1 10*3/uL (ref 4.0–10.5)

## 2022-04-08 NOTE — Progress Notes (Signed)
Russell Brown    [id number]    Sep 16, 1945  Primary Care Physician:Kremer, Mortimer Fries, MD  Referring Physician: Libby Maw, MD [place],  [place] [address]  Chief complaint: Follow-up for interstitial lung disease   HPI: 76 y.o.  with history of COPD, asthma, GERD, hiatal hernia status post Nissen's fundoplication.  He is followed by Dr. Halford Chessman Referred for CT scan findings showing pulmonary fibrosis He has worsening dyspnea on exertion over several years, nonproductive cough.  No wheezing maintained on anoro inhaler Has severe GERD and follows with Dr. Henrene Pastor, GI.  Nissen fundoplication in 7062.  EGD in April 3762 showed fundoplication was undone.  He continues on PPI but has significant GERD symptoms.  Is due for follow-up with Dr. Henrene Pastor next month.  Started Esbriet in January 2023.  Pets: No pets Occupation: Used to work in the [employer].  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  He also did woodworking as a hobby for many years but he did not have these exposures for the past 20 years.  He has down pillows on the living room couch Smoking history: 35-pack-year smoker.  Quit in 1997 Travel history: No significant travel history Relevant family history: No family history of lung disease  Interim history: Russell Brown is poorly tolerated earlier this year.  He took it for 4 months from January to April of 2023 but had significant issues with nausea and stopped altogether.  He had a follow-up appointment with me in May 2023 that he canceled  Seeing Dr. Henrene Pastor for GERD.[name] Kitchen  PPI increased to twice daily  Outpatient Encounter Medications as of 04/08/2022  Medication Sig   ANORO ELLIPTA 62.5-25 MCG/ACT AEPB USE 1 INHALATION BY MOUTH  DAILY   aspirin EC 81 MG tablet Take 81 mg by mouth daily.   atorvastatin (LIPITOR) 20 MG tablet  TAKE 1 TABLET BY MOUTH  DAILY   dutasteride (AVODART) 0.5 MG capsule TAKE 1 CAPSULE BY MOUTH  DAILY   fish oil-omega-3 fatty acids 1000 MG capsule Take 2 g by mouth daily.    levothyroxine (SYNTHROID) 75 MCG tablet Take 1 tablet (75 mcg total) by mouth daily before breakfast.   lisinopril-hydrochlorothiazide (ZESTORETIC) 10-12.5 MG tablet TAKE 1 TABLET BY MOUTH  DAILY   Multiple Vitamin (MULTIVITAMIN WITH MINERALS) TABS tablet Take 1 tablet by mouth daily.   nitroGLYCERIN (NITROSTAT) 0.4 MG SL tablet PLACE 1 TABLET UNDER THE TONGUE EVERY 5 MINUTES AS NEEDED   omeprazole (PRILOSEC) 40 MG capsule TAKE 1 CAPSULE BY MOUTH  TWICE DAILY   sertraline (ZOLOFT) 50 MG tablet TAKE 1 TABLET BY MOUTH DAILY   [DISCONTINUED] amoxicillin (AMOXIL) 500 MG capsule Take 500 mg by mouth every 6 (six) hours. Before dental appointment   [DISCONTINUED] ondansetron (ZOFRAN) 4 MG tablet Take 1 tablet (4 mg total) by mouth every 8 (eight) hours as needed for nausea or vomiting.   No facility-administered encounter medications on file as of 04/08/2022.    Physical Exam: Blood pressure 116/64, pulse 77, temperature 98 F (36.7 C), temperature source Oral, height '6\' 1"'$  (1.854 m), weight 181 lb 9.6 oz (82.4 kg), SpO2 96 %. Gen:      No acute distress HEENT:  EOMI, sclera anicteric Neck:     No masses; no thyromegaly Lungs:   Bibasal crackles CV:         Regular rate and  rhythm; no murmurs Abd:      + bowel sounds; soft, non-tender; no palpable masses, no distension Ext:    No edema; adequate peripheral perfusion Skin:      Warm and dry; no rash Neuro: alert and oriented x 3 Psych: normal mood and affect   Data Reviewed: Imaging: High-res CT 05/12/2020-severe emphysema, irregular peripheral opacity in the lower lobes.  Indeterminate for UIP.  CT chest 04/30/2021-mild progression of basal fibrotic changes with reticulation, traction bronchiectasis.  No honeycombing.  Probable UIP pattern.  I have reviewed the  images personally.  PFTs: 05/28/2020 FVC 3.98 [83%], FEV1 2.04 [58%], F/F 51, TLC 6.97 [91%], DLCO 13.73 [49%]  08/12/2020 FVC 3.68 [77%], FEV1 2.00 [57%], F/F 54, TLC 5.72 [74%], DLCO 13.26 [48%] Moderate restriction, severe diffusion defect  Labs: ANA, rheumatoid factor 07/04/20-negative  Assessment:  Interstitial lung disease, IPF I have reviewed his scans dating back to 2006.  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  This is likely to be IPF with high probability  He does have exposures to down pillows but this is minimal as the pillows are on living room couch and he has gotten rid of them in 2022.  He does also have ongoing GERD and reflux which is likely contributing to progression.  There is remote exposures to Aspergillus and wood dust but not recently so I do not believe this is the reason for his progressive pulmonary fibrosis. Labs show borderline elevation in ANA which is nonspecific  Esbriet is poorly tolerated.  We discussed alternate medications with Ofev and he has agreed to give an attempt Check baseline labs Referral to pulmonary rehab Follow-up CT and PFTs.  GERD Remote fundoplication with slipped warp on EGD Continue PPI twice daily.  Follows with gastroenterology  Plan/Recommendations: Start paperwork for Ofev Baseline labs Pulm rehab Follow-up CT PFTs  Marshell Garfinkel MD [facility] 04/08/2022, 9:13 AM  CC: Libby Maw,*

## 2022-04-08 NOTE — Patient Instructions (Signed)
We will start paperwork for alternate medication called Ofev Check comprehensive metabolic panel, CBC differential and proBNP for dyspnea today Referred to pulmonary rehab Order high-res CT and PFTs for evaluation of the lungs Follow-up in clinic in 3 months

## 2022-04-08 NOTE — Telephone Encounter (Signed)
He wants to go [place] [facility] [place] [facility] [place] and not [facility]? Can you change?

## 2022-04-09 ENCOUNTER — Telehealth: Payer: Self-pay | Admitting: Pharmacist

## 2022-04-09 ENCOUNTER — Other Ambulatory Visit (HOSPITAL_COMMUNITY): Payer: Self-pay

## 2022-04-09 LAB — PRO B NATRIURETIC PEPTIDE: NT-Pro BNP: 101 pg/mL (ref 0–486)

## 2022-04-09 NOTE — Telephone Encounter (Signed)
Received new start paperwork for Ofev. Patient previously took Esbriet which he was unable to tolerate after 4 months.  Dx: IPF Dose; '150mg'$  twice daily  Submitted a Prior Authorization request to [facility] for OFEV via CoverMyMeds. Will update once we receive a response.  Key: [employer] paperwork placed in PAP pending info folder in pharmacy office (pending PA determination and income documents needed from pt)  Knox Saliva, PharmD, MPH, BCPS, CPP Clinical Pharmacist (Rheumatology and Pulmonology)

## 2022-04-09 NOTE — Telephone Encounter (Signed)
Received notification from [facility] regarding a prior authorization for [place]. Authorization has been APPROVED from 04/09/22 to 06/21/2023.   Per test claim, copay for 30 days supply is $65  Authorization # [id number]  I spoke with patient. He would like to move forward with patient assistance. He will mail tax return to clinic and [place] team so we cna submit with [employer] application  Knox Saliva, PharmD, MPH, BCPS, CPP Clinical Pharmacist (Rheumatology and Pulmonology)

## 2022-04-14 ENCOUNTER — Other Ambulatory Visit (HOSPITAL_COMMUNITY): Payer: Self-pay

## 2022-04-14 NOTE — Telephone Encounter (Signed)
Submitted Patient Assistance Application to [employer] for OFEV along with provider portion, PA and income documents. Will update patient when we receive a response.  Fax# [phone] Phone# [phone] 

## 2022-04-15 NOTE — Telephone Encounter (Signed)
Received fax from [facility] that prescrpition requires clarification. Appears that birthday written on first page was incorrect and birthday written on rx. Rep requests refax of first page so they can correct in the system. Addended page and refaxed  Knox Saliva, PharmD, MPH, BCPS, CPP Clinical Pharmacist (Rheumatology and Pulmonology)

## 2022-04-16 NOTE — Telephone Encounter (Signed)
Received a fax from  [facility] regarding an approval for [place] patient assistance from 04/15/2022 to 06/20/2022.   Reached out to pt and LVM providing update and [employer] contact info, urged him to reach out to them if he has not already been contacted. My direct office number was provided and I requested he reach out to me if he had any additional questions.

## 2022-04-20 ENCOUNTER — Other Ambulatory Visit: Payer: Self-pay | Admitting: Family Medicine

## 2022-04-20 DIAGNOSIS — I10 Essential (primary) hypertension: Secondary | ICD-10-CM

## 2022-04-21 ENCOUNTER — Telehealth: Payer: Self-pay | Admitting: Family Medicine

## 2022-04-21 ENCOUNTER — Telehealth: Payer: Self-pay

## 2022-04-21 MED ORDER — OFEV 150 MG PO CAPS: 150.0000 mg | ORAL_CAPSULE | Freq: Two times a day (BID) | ORAL | 1 refills | Status: DC

## 2022-04-21 NOTE — Telephone Encounter (Addendum)
Subjective:  Patient called today by [facility] pharmacy team for Ofev new start counseling. Patient was last seen by Dr. Vaughan Browner on 04/08/22. 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 Prior therapy includes Esbriet.  History of CAD: Yes History of MI: No Current anticoagulant use: No History of HTN: Yes  History of elevated LFTs: No History of diarrhea, nausea, vomiting: Yes  Objective: Allergies  Allergen Reactions   Codeine Anxiety    Reaction if taken for extended periods of time.    Outpatient Encounter Medications as of 04/21/2022  Medication Sig   ANORO ELLIPTA 62.5-25 MCG/ACT AEPB USE 1 INHALATION BY MOUTH  DAILY   aspirin EC 81 MG tablet Take 81 mg by mouth daily.   atorvastatin (LIPITOR) 20 MG tablet TAKE 1 TABLET BY MOUTH  DAILY   dutasteride (AVODART) 0.5 MG capsule TAKE 1 CAPSULE BY MOUTH  DAILY   fish oil-omega-3 fatty acids 1000 MG capsule Take 2 g by mouth daily.    levothyroxine (SYNTHROID) 75 MCG tablet Take 1 tablet (75 mcg total) by mouth daily before breakfast.   lisinopril-hydrochlorothiazide (ZESTORETIC) 10-12.5 MG tablet TAKE 1 TABLET BY MOUTH DAILY   Multiple Vitamin (MULTIVITAMIN WITH MINERALS) TABS tablet Take 1 tablet by mouth daily.   nitroGLYCERIN (NITROSTAT) 0.4 MG SL tablet PLACE 1 TABLET UNDER THE TONGUE EVERY 5 MINUTES AS NEEDED   omeprazole (PRILOSEC) 40 MG capsule TAKE 1 CAPSULE BY MOUTH  TWICE DAILY   sertraline (ZOLOFT) 50 MG tablet TAKE 1 TABLET BY MOUTH DAILY   No facility-administered encounter medications on file as of 04/21/2022.     Immunization History  Administered Date(s) Administered   Fluad Quad(high Dose 65+) 03/16/2019   H1N1 05/28/2008   Influenza Whole 04/28/2007, 03/26/2008, 04/16/2009, 05/07/2010, 04/10/2011, 04/13/2012   Influenza, High Dose Seasonal PF 03/05/2014, 03/17/2015, 03/17/2018, 03/26/2019, 03/05/2022   Influenza, Seasonal,  Injecte, Preservative Fre 03/06/2013   Influenza,inj,Quad PF,6+ Mos 03/04/2016   Influenza-Unspecified 03/22/2011, 03/11/2020, 03/27/2021   Moderna SARS-COV2 Booster Vaccination 05/05/2020   Moderna Sars-Covid-2 Vaccination 08/06/2019, 09/04/2019, 03/19/2022   Pneumococcal Conjugate-13 12/05/2014   Pneumococcal Polysaccharide-23 04/11/2008, 04/18/2013, 03/16/2019   Td 05/22/2009, 02/16/2011   Zoster Recombinat (Shingrix) 03/21/2017, 09/10/2017   Zoster, Live 05/22/2009    PFT's TLC  Date Value Ref Range Status  08/12/2021 5.72 L Final    CMP     Component Value Date/Time   NA 138 04/08/2022 0933   K 4.2 04/08/2022 0933   CL 103 04/08/2022 0933   CO2 29 04/08/2022 0933   GLUCOSE 90 04/08/2022 0933   GLUCOSE 98 04/22/2006 1057   BUN 14 04/08/2022 0933   CREATININE 1.04 04/08/2022 0933   CALCIUM 9.1 04/08/2022 0933   PROT 6.7 04/08/2022 0933   ALBUMIN 4.3 04/08/2022 0933   AST 18 04/08/2022 0933   ALT 20 04/08/2022 0933   ALKPHOS 75 04/08/2022 0933   BILITOT 0.5 04/08/2022 0933   GFRNONAA >60 04/26/2018 0913   GFRAA >60 04/26/2018 0913    CBC    Component Value Date/Time   WBC 7.1 04/08/2022 0933   RBC 5.17 04/08/2022 0933   HGB 15.4 04/08/2022 0933   HCT 46.7 04/08/2022 0933   PLT 203.0 04/08/2022 0933   MCV 90.4 04/08/2022 0933   MCH 30.0 04/26/2018 0913   MCHC 33.0 04/08/2022 0933   RDW 13.4 04/08/2022 0933   LYMPHSABS 1.2 04/08/2022 0933   MONOABS 0.6 04/08/2022 0933   EOSABS 0.3  04/08/2022 0933   BASOSABS 0.0 04/08/2022 0933    LFT's    Latest Ref Rng & Units 04/08/2022    9:33 AM 09/16/2021    9:58 AM 08/12/2021    2:38 PM  Hepatic Function  Total Protein 6.0 - 8.3 g/dL 6.7  7.0  6.6   Albumin 3.5 - 5.2 g/dL 4.3  4.8  4.3   AST 0 - 37 U/L _0 ALT 0 - 53 U/L _1 Alk Phosphatase 39 - 117 U/L 75  79  65   Total Bilirubin 0.2 - 1.2 mg/dL 0.5  0.4  0.4     HRCT (05/12/20)- There is mild irregular peripheral interstitial opacity  throughout the lungs, most conspicuous in the lung bases and new compared to remote prior examination dated 2006. No significant air trapping on expiratory phase imaging. Findings are suggestive of mild pulmonary fibrosis although generally nonspecific and may reflect sequelae of prior infection or inflammation. If characterized by ATS pulmonary fibrosis criteria, these findings are in an "indeterminate for UIP" pattern.   Assessment and Plan  Ofev Medication Management Thoroughly counseled patient on the efficacy, mechanism of action, dosing, administration, adverse effects, and monitoring parameters of Ofev. Patient verbalized understanding.  Goals of Therapy: Will not stop or reverse the progression of ILD. It will slow the progression of ILD.  Inhibits tyrosine kinase inhibitors which slow the fibrosis/progression of ILD -Significant reduction in the rate of disease progression was observed after treatment (61.1% [before] vs 33.3% [after], P?=?0.008) over 42 weeks.  Dosing: 150 mg (one capsule) by mouth twice daily (approx 12 hours apart). Discussed taking with food approximately 12 hours apart. Discussed that capsule should not be crushed or split.  Adverse Effects: Nausea, vomiting, diarrhea (2 in 3 patients) appetite loss, weight loss - management of diarrhea with loperamide discussed including max use of 48 hours and max of 8 capsules per day. Abdominal pain (up to 1 in 5 patients) Nasopharyngitis (13%), UTI (6%) Risk of thrombosis (3%) and acute MI (2%) Hypertension (5%) Dizziness Fatigue (10%)  Monitoring: Monitor for diarrhea, nausea and vomiting, GI perforation, hepatotoxicity  Monitor LFTs - baseline, monthly for first 6 months, then every 3 months routinely CBC w differential at baseline and every 3 months routinely Patient would like labs compelted with PCP Dr. Bebe Shaggy office. Will send staff message to request this.  Access: Approval of Ofev through: patient  assistance Rx sent to: [employer] for Ofev: [id number]  Medication Reconciliation A drug regimen assessment was performed, including review of allergies, interactions, disease-state management, dosing and immunization history. Medications were reviewed with the patient, including name, instructions, indication, goals of therapy, potential side effects, importance of adherence, and safe use.  Anticoagulant use: No  This appointment required 20 minutes of patient care (this includes precharting, chart review, review of results, face-to-face care, etc.).  Thank you for involving pharmacy to assist in providing this patient's care.   Rodolph Bong, PharmD Candidate 04/21/2022 1:42 PM

## 2022-04-21 NOTE — Telephone Encounter (Signed)
Left message for patient to call back and schedule Medicare Annual Wellness Visit (AWV).   Please offer to do virtually or by telephone.  Left office number and my jabber [phone].  Last AWV:04/28/2021  Please schedule at anytime with Nurse Health Advisor.

## 2022-04-22 ENCOUNTER — Encounter (HOSPITAL_COMMUNITY): Payer: Self-pay

## 2022-04-22 ENCOUNTER — Telehealth (HOSPITAL_COMMUNITY): Payer: Self-pay

## 2022-04-22 NOTE — Telephone Encounter (Signed)
Attempted to call patient in regards to Pulmonary Rehab - LM on VM Mailed letter 

## 2022-04-23 ENCOUNTER — Encounter (HOSPITAL_COMMUNITY): Payer: Self-pay

## 2022-05-05 ENCOUNTER — Ambulatory Visit (INDEPENDENT_AMBULATORY_CARE_PROVIDER_SITE_OTHER): Payer: Medicare Other | Admitting: Nurse Practitioner

## 2022-05-05 ENCOUNTER — Encounter: Payer: Self-pay | Admitting: Nurse Practitioner

## 2022-05-05 VITALS — BP 118/65 | HR 82 | Temp 97.4°F | Wt 176.8 lb

## 2022-05-05 DIAGNOSIS — E038 Other specified hypothyroidism: Secondary | ICD-10-CM | POA: Diagnosis not present

## 2022-05-05 DIAGNOSIS — I10 Essential (primary) hypertension: Secondary | ICD-10-CM | POA: Diagnosis not present

## 2022-05-05 DIAGNOSIS — Z23 Encounter for immunization: Secondary | ICD-10-CM

## 2022-05-05 DIAGNOSIS — F4310 Post-traumatic stress disorder, unspecified: Secondary | ICD-10-CM | POA: Diagnosis not present

## 2022-05-05 DIAGNOSIS — E7800 Pure hypercholesterolemia, unspecified: Secondary | ICD-10-CM

## 2022-05-05 DIAGNOSIS — F411 Generalized anxiety disorder: Secondary | ICD-10-CM

## 2022-05-05 DIAGNOSIS — N400 Enlarged prostate without lower urinary tract symptoms: Secondary | ICD-10-CM

## 2022-05-05 DIAGNOSIS — K219 Gastro-esophageal reflux disease without esophagitis: Secondary | ICD-10-CM | POA: Diagnosis not present

## 2022-05-05 LAB — LIPID PANEL
Cholesterol: 124 mg/dL (ref 0–200)
HDL: 46.9 mg/dL (ref >39.00)
LDL Cholesterol: 46 mg/dL (ref 0–99)
NonHDL: 76.73
Total CHOL/HDL Ratio: 3
Triglycerides: 154 mg/dL — ABNORMAL HIGH (ref 0.0–149.0)
VLDL: 30.8 mg/dL (ref 0.0–40.0)

## 2022-05-05 LAB — T4, FREE: Free T4: 1.13 ng/dL (ref 0.60–1.60)

## 2022-05-05 LAB — TSH: TSH: 1.67 u[IU]/mL (ref 0.35–5.50)

## 2022-05-05 MED ORDER — ATORVASTATIN CALCIUM 20 MG PO TABS: 20.0000 mg | ORAL_TABLET | Freq: Every day | ORAL | 3 refills | Status: DC

## 2022-05-05 MED ORDER — SERTRALINE HCL 50 MG PO TABS: 75.0000 mg | ORAL_TABLET | Freq: Every day | ORAL | 3 refills | Status: DC

## 2022-05-05 MED ORDER — DUTASTERIDE 0.5 MG PO CAPS: 0.5000 mg | ORAL_CAPSULE | Freq: Every day | ORAL | 3 refills | Status: DC

## 2022-05-05 MED ORDER — OMEPRAZOLE 40 MG PO CPDR: 40.0000 mg | DELAYED_RELEASE_CAPSULE | Freq: Two times a day (BID) | ORAL | 3 refills | Status: DC

## 2022-05-05 NOTE — Progress Notes (Signed)
Established Patient Visit  Patient: Russell Brown   DOB: 10/14/1945   76 y.o. Male  MRN: [id number] Visit Date: 05/05/2022  Subjective:    Chief Complaint  Patient presents with   Establish Care    Transfer care from Dr. Ethelene Hal, 6 mo follow up--- no concerns. Pt would like tetanus    HPI GERD Followed by Dr. Henrene Pastor S/p funduplication 7494. Use of omeprazole '40mg'$  bID Symptoms controlled  Benign prostate hyperplasia Followed by [facility]: Dr. 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 Reports increased breakthrough anxiety and irritability in last few weeks.  Increase zoloft to '75mg'$  F/up in 5month  Hypothyroidism Repeat TSH and T4 Maintain levothyroxine dose  Essential hypertension BP at goal with lisinopril/hctz BP Readings from Last 3 Encounters:  05/05/22 118/65  04/08/22 116/64  08/25/21 125/74    Maintain med dose  Hyperlipidemia Repeat lipid panel  Reviewed medical, surgical, and social history today  Medications: Outpatient Medications Prior to Visit  Medication Sig   ANORO ELLIPTA 62.5-25 MCG/ACT AEPB USE 1 INHALATION BY MOUTH  DAILY   aspirin EC 81 MG tablet Take 81 mg by mouth daily.   fish oil-omega-3 fatty acids 1000 MG capsule Take 2 g by mouth daily.    levothyroxine (SYNTHROID) 75 MCG tablet Take 1 tablet (75 mcg total) by mouth daily before breakfast.   lisinopril-hydrochlorothiazide (ZESTORETIC) 10-12.5 MG tablet TAKE 1 TABLET BY MOUTH DAILY   Multiple Vitamin (MULTIVITAMIN WITH MINERALS) TABS tablet Take 1 tablet by mouth daily.   Nintedanib (OFEV) 150 MG CAPS Take 1 capsule (150 mg total) by mouth 2 (two) times daily.   nitroGLYCERIN (NITROSTAT) 0.4 MG SL tablet PLACE 1 TABLET UNDER THE TONGUE EVERY 5 MINUTES AS NEEDED   [DISCONTINUED] atorvastatin (LIPITOR) 20  MG tablet TAKE 1 TABLET BY MOUTH  DAILY   [DISCONTINUED] dutasteride (AVODART) 0.5 MG capsule TAKE 1 CAPSULE BY MOUTH  DAILY   [DISCONTINUED] omeprazole (PRILOSEC) 40 MG capsule TAKE 1 CAPSULE BY MOUTH  TWICE DAILY   [DISCONTINUED] sertraline (ZOLOFT) 50 MG tablet TAKE 1 TABLET BY MOUTH DAILY   No facility-administered medications prior to visit.   Reviewed past medical and social history.   ROS per HPI above      Objective:  BP 118/65 (BP Location: Left Arm, Patient Position: Sitting, Cuff Size: Normal)   Pulse 82   Temp (!) 97.4 F (36.3 C) (Temporal)   Wt 176 lb 12.8 oz (80.2 kg)   SpO2 97%   BMI 23.33 kg/m      Physical Exam Vitals reviewed.  Cardiovascular:     Rate and Rhythm: Normal rate and regular rhythm.     Pulses: Normal pulses.     Heart sounds: Normal heart sounds.  Pulmonary:     Effort: Pulmonary effort is normal.     Comments: Diminished in bilateral lower lobes Musculoskeletal:     Right lower leg: No edema.     Left lower leg: No edema.  Neurological:     Mental Status: He is alert and oriented to person, place, and time.     No results found for any visits on 05/05/22.    Assessment & Plan:    Problem List Items Addressed This Visit  Cardiovascular and Mediastinum   Essential hypertension - Primary    BP at goal with lisinopril/hctz BP Readings from Last 3 Encounters:  05/05/22 118/65  04/08/22 116/64  08/25/21 125/74    Maintain med dose      Relevant Medications   atorvastatin (LIPITOR) 20 MG tablet     Digestive   GERD    Followed by Dr. Henrene Pastor S/p funduplication 3612. Use of omeprazole '40mg'$  bID Symptoms controlled      Relevant Medications   omeprazole (PRILOSEC) 40 MG capsule     Endocrine   Hypothyroidism    Repeat TSH and T4 Maintain levothyroxine dose      Relevant Orders   T4, free   TSH     Genitourinary   Benign prostate hyperplasia    Followed by [facility]: Dr. 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 Reports increased breakthrough anxiety and irritability in last few weeks.  Increase zoloft to '75mg'$  F/up in 7month      Relevant Medications   sertraline (ZOLOFT) 50 MG tablet   Other Visit Diagnoses     Need for Tdap vaccination       Relevant Orders   Tdap vaccine greater than or equal to 7yo IM (Completed)      Return in about 3 months (around 08/05/2022) for anxiety.     CWilfred Lacy NP

## 2022-05-05 NOTE — Assessment & Plan Note (Signed)
Repeat lipid panel ?

## 2022-05-05 NOTE — Assessment & Plan Note (Signed)
BP at goal with lisinopril/hctz BP Readings from Last 3 Encounters:  05/05/22 118/65  04/08/22 116/64  08/25/21 125/74    Maintain med dose

## 2022-05-05 NOTE — Assessment & Plan Note (Addendum)
Followed by [facility]: Dr. Junious Silk Current use of avodart No hematuria or dysuria or retention Requested records from urology

## 2022-05-05 NOTE — Patient Instructions (Signed)
Increased zoloft to '75mg'$  daily Go to lab Please have urology report faxed to me

## 2022-05-05 NOTE — Assessment & Plan Note (Signed)
Repeat TSH and T4 Maintain levothyroxine dose

## 2022-05-05 NOTE — Assessment & Plan Note (Signed)
Followed by Dr. Henrene Pastor S/p funduplication 4332. Use of omeprazole '40mg'$  bID Symptoms controlled

## 2022-05-05 NOTE — Assessment & Plan Note (Signed)
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 Reports increased breakthrough anxiety and irritability in last few weeks.  Increase zoloft to '75mg'$  F/up in 6month

## 2022-05-10 ENCOUNTER — Telehealth: Payer: Self-pay | Admitting: Nurse Practitioner

## 2022-05-10 NOTE — Telephone Encounter (Signed)
Left message for patient to call back and schedule Medicare Annual Wellness Visit (AWV) in office.   If not able to come in office, please offer to do virtually or by telephone.  Left office number and my jabber [phone].  Last AWV:04/28/2021   Please schedule at anytime with Nurse Health Advisor.

## 2022-05-15 ENCOUNTER — Other Ambulatory Visit: Payer: Self-pay | Admitting: Family Medicine

## 2022-05-15 DIAGNOSIS — E039 Hypothyroidism, unspecified: Secondary | ICD-10-CM

## 2022-05-17 ENCOUNTER — Other Ambulatory Visit: Payer: Medicare Other

## 2022-05-17 ENCOUNTER — Ambulatory Visit (HOSPITAL_BASED_OUTPATIENT_CLINIC_OR_DEPARTMENT_OTHER)
Admission: RE | Admit: 2022-05-17 | Discharge: 2022-05-17 | Disposition: A | Discharge status: Home / Self Care | Payer: Medicare Other | Source: Ambulatory Visit | Attending: Pulmonary Disease | Admitting: Pulmonary Disease

## 2022-05-17 ENCOUNTER — Encounter (HOSPITAL_COMMUNITY): Payer: Self-pay

## 2022-05-17 ENCOUNTER — Telehealth (HOSPITAL_COMMUNITY): Payer: Self-pay

## 2022-05-17 DIAGNOSIS — J849 Interstitial pulmonary disease, unspecified: Secondary | ICD-10-CM | POA: Insufficient documentation

## 2022-05-17 NOTE — Telephone Encounter (Signed)
Attempted to call patient in regards to Pulmonary Rehab - LM on VM Mailed letter 

## 2022-05-27 ENCOUNTER — Telehealth (HOSPITAL_COMMUNITY): Payer: Self-pay

## 2022-05-27 NOTE — Telephone Encounter (Signed)
RN called and LVM to confirm Pulmonary Rehab tomorrow. Awaiting pt to call back.

## 2022-05-28 ENCOUNTER — Encounter (HOSPITAL_COMMUNITY)
Admission: RE | Admit: 2022-05-28 | Discharge: 2022-05-28 | Disposition: A | Discharge status: Home / Self Care | Payer: Medicare Other | Source: Ambulatory Visit | Attending: Pulmonary Disease | Admitting: Pulmonary Disease

## 2022-05-28 ENCOUNTER — Encounter (HOSPITAL_COMMUNITY): Payer: Self-pay

## 2022-05-28 VITALS — BP 142/70 | HR 78 | Wt 179.2 lb

## 2022-05-28 DIAGNOSIS — J849 Interstitial pulmonary disease, unspecified: Secondary | ICD-10-CM | POA: Diagnosis present

## 2022-05-28 NOTE — Progress Notes (Signed)
Pulmonary Rehab Orientation Physical Assessment Note  Physical assessment reveals  Pt is alert and oriented x 4.  Heart rate is normal, breath sounds diminished, air entry reduced both upper lobes. Reports non-productive cough. Bowel sounds present in all 4 quadrants.  Pt denies abdominal discomfort, nausea, vomiting or diarrhea, now that he is no longer taking Ofev. Grip strength equal, strong. Distal pulses present; no swelling to lower extremities.

## 2022-05-28 NOTE — Progress Notes (Deleted)
Pulmonary Individual Treatment Plan  Patient Details  Name: Russell Brown MRN: [id number] Date of Birth: January 05, 1946 Referring Provider:   April Manson Pulmonary Rehab Walk Test from 05/28/2022 in [facility] for Heart, Vascular, & [place]  Referring Provider Mannam       Initial Encounter Date:  Flowsheet Row Pulmonary Rehab Walk Test from 05/28/2022 in [facility] for Heart, Vascular, & [place]  Date 05/28/22       Visit Diagnosis: ILD (interstitial lung disease) ([place])  Patient's Home Medications on Admission:   Current Outpatient Medications:    ANORO ELLIPTA 62.5-25 MCG/ACT AEPB, USE 1 INHALATION BY MOUTH  DAILY, Disp: 180 each, Rfl: 3   aspirin EC 81 MG tablet, Take 81 mg by mouth daily., Disp: , Rfl:    atorvastatin (LIPITOR) 20 MG tablet, Take 1 tablet (20 mg total) by mouth daily., Disp: 90 tablet, Rfl: 3   calcium citrate (CALCITRATE - DOSED IN MG ELEMENTAL CALCIUM) 950 (200 Ca) MG tablet, Take 600 mg of elemental calcium by mouth daily., Disp: , Rfl:    cholecalciferol (VITAMIN D3) 25 MCG (1000 UNIT) tablet, Take 800 Units by mouth daily., Disp: , Rfl:    dutasteride (AVODART) 0.5 MG capsule, Take 1 capsule (0.5 mg total) by mouth daily., Disp: 90 capsule, Rfl: 3   fish oil-omega-3 fatty acids 1000 MG capsule, Take 2 g by mouth daily. , Disp: , Rfl:    levothyroxine (SYNTHROID) 75 MCG tablet, TAKE 1 TABLET BY MOUTH DAILY  BEFORE BREAKFAST, Disp: 90 tablet, Rfl: 0   lisinopril-hydrochlorothiazide (ZESTORETIC) 10-12.5 MG tablet, TAKE 1 TABLET BY MOUTH DAILY, Disp: 90 tablet, Rfl: 0   Multiple Vitamin (MULTIVITAMIN WITH MINERALS) TABS tablet, Take 1 tablet by mouth daily., Disp: , Rfl:    nitroGLYCERIN (NITROSTAT) 0.4 MG SL tablet, PLACE 1 TABLET UNDER THE TONGUE EVERY 5 MINUTES AS NEEDED, Disp: 25 tablet, Rfl: 3   omeprazole (PRILOSEC) 40 MG capsule, Take 1 capsule (40 mg total) by mouth 2 (two) times daily., Disp:  180 capsule, Rfl: 3   sertraline (ZOLOFT) 50 MG tablet, Take 1.5 tablets (75 mg total) by mouth daily., Disp: 135 tablet, Rfl: 3   Nintedanib (OFEV) 150 MG CAPS, Take 1 capsule (150 mg total) by mouth 2 (two) times daily. 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   COPD (chronic obstructive pulmonary disease) (HCC)    GERD (gastroesophageal reflux disease)    History of hiatal hernia    Hyperlipemia    Hypertension    Hypothyroidism    Pneumonia 2013; 2017   PTSD (post-traumatic stress disorder)    retired Engineer, structural    Pulmonary fibrosis ([place])    Sleep apnea    mild - but does not wear a c-pap per pt   Thyroid disease    thyroid nodules - half of thyroid was removed per pt    Tobacco Use: Social History   Tobacco Use  Smoking Status Former   Packs/day: 1.00   Years: 35.00   Total pack years: 35.00   Types: Cigarettes   Quit date: 08/18/1996   Years since quitting: 25.7   Passive exposure: Past  Smokeless Tobacco Never    Labs: Review Flowsheet  More  data exists      Latest Ref Rng & Units 02/21/2018 03/16/2019 03/17/2020 05/06/2021 05/05/2022  Labs for ITP Cardiac and Pulmonary Rehab  Cholestrol 0 - 200 mg/dL 126  141  132  134  124   LDL (calc) 0 - 99 mg/dL 68  61  61  55  46   HDL-C >39.00 mg/dL 41  41.50  40.20  46.50  46.90   Trlycerides 0.0 - 149.0 mg/dL 85  194.0  157.0  161.0  154.0     Capillary Blood Glucose: Lab Results  Component Value Date   GLUCAP 100 (H) 02/20/2018     Pulmonary Assessment Scores:  Pulmonary Assessment Scores     Row Name 05/28/22 0923         ADL UCSD   ADL Phase Entry     SOB Score total 21       CAT Score   CAT Score 21       mMRC Score   mMRC Score 2             UCSD: Self-administered rating of  dyspnea associated with activities of daily living (ADLs) 6-point scale (0 = "not at all" to 5 = "maximal or unable to do because of breathlessness")  Scoring Scores range from 0 to 120.  Minimally important difference is 5 units  CAT: CAT can identify the health impairment of COPD patients and is better correlated with disease progression.  CAT has a scoring range of zero to 40. The CAT score is classified into four groups of low (less than 10), medium (10 - 20), high (21-30) and very high (31-40) based on the impact level of disease on health status. A CAT score over 10 suggests significant symptoms.  A worsening CAT score could be explained by an exacerbation, poor medication adherence, poor inhaler technique, or progression of COPD or comorbid conditions.  CAT MCID is 2 points  mMRC: mMRC (Modified Medical Research Council) Dyspnea Scale is used to assess the degree of baseline functional disability in patients of respiratory disease due to dyspnea. No minimal important difference is established. A decrease in score of 1 point or greater is considered a positive change.   Pulmonary Function Assessment:  Pulmonary Function Assessment - 05/28/22 0958       Breath   Bilateral Breath Sounds Decreased    Shortness of Breath Limiting activity             Exercise Target Goals: Exercise Program Goal: Individual exercise prescription set using results from initial 6 min walk test and THRR while considering  patient's activity barriers and safety.   Exercise Prescription Goal: Initial exercise prescription builds to 30-45 minutes a day of aerobic activity, 2-3 days per week.  Home exercise guidelines will be given to patient during program as part of exercise prescription that the participant will acknowledge.  Activity Barriers & Risk Stratification:  Activity Barriers & Cardiac Risk Stratification - 05/28/22 0929       Activity Barriers & Cardiac Risk Stratification   Activity  Barriers Arthritis;Muscular Weakness;Shortness of Breath;Deconditioning    Cardiac Risk Stratification Moderate             6 Minute Walk:  6 Minute Walk     Row Name 05/28/22 1000         6 Minute Walk   Phase Initial     Distance 1490 feet     Walk Time 6 minutes     # of  Rest Breaks 0     MPH 2.82     METS 2.87     RPE 11     Perceived Dyspnea  1     VO2 Peak 10.03     Symptoms No     Resting HR 78 bpm     Resting BP 142/70     Resting Oxygen Saturation  95 %     Exercise Oxygen Saturation  during 6 min walk 90 %     Max Ex. HR 111 bpm     Max Ex. BP 170/62     2 Minute Post BP 150/64       Interval HR   1 Minute HR 88     2 Minute HR 88     3 Minute HR 98     4 Minute HR 102     5 Minute HR 106     6 Minute HR 111     2 Minute Post HR 88     Interval Heart Rate? Yes       Interval Oxygen   Interval Oxygen? Yes     Baseline Oxygen Saturation % 95 %     1 Minute Oxygen Saturation % 94 %     1 Minute Liters of Oxygen 0 L     2 Minute Oxygen Saturation % 93 %     2 Minute Liters of Oxygen 0 L     3 Minute Oxygen Saturation % 90 %     3 Minute Liters of Oxygen 0 L     4 Minute Oxygen Saturation % 91 %     4 Minute Liters of Oxygen 0 L     5 Minute Oxygen Saturation % 91 %     5 Minute Liters of Oxygen 0 L     6 Minute Oxygen Saturation % 91 %     6 Minute Liters of Oxygen 0 L     2 Minute Post Oxygen Saturation % 95 %     2 Minute Post Liters of Oxygen 0 L              Oxygen Initial Assessment:  Oxygen Initial Assessment - 05/28/22 0923       Home Oxygen   Home Oxygen Device None    Sleep Oxygen Prescription None    Home Exercise Oxygen Prescription None    Home Resting Oxygen Prescription None    Compliance with Home Oxygen Use Yes      Initial 6 min Walk   Oxygen Used None      Program Oxygen Prescription   Program Oxygen Prescription None      Intervention   Short Term Goals To learn and exhibit compliance with exercise, home  and travel O2 prescription;To learn and understand importance of maintaining oxygen saturations>88%;To learn and demonstrate proper use of respiratory medications;To learn and understand importance of monitoring SPO2 with pulse oximeter and demonstrate accurate use of the pulse oximeter.;To learn and demonstrate proper pursed lip breathing techniques or other breathing techniques. ;Other    Long  Term Goals Demonstrates proper use of MDI's;Compliance with respiratory medication;Exhibits proper breathing techniques, such as pursed lip breathing or other method taught during program session;Maintenance of O2 saturations>88%;Verbalizes importance of monitoring SPO2 with pulse oximeter and return demonstration;Exhibits compliance with exercise, home  and travel O2 prescription             Oxygen Re-Evaluation:   Oxygen Discharge (Final Oxygen Re-Evaluation):  Initial Exercise Prescription:  Initial Exercise Prescription - 05/28/22 1000       Date of Initial Exercise RX and Referring Provider   Date 05/28/22    Referring Provider Mannam    Expected Discharge Date 08/05/22      Treadmill   MPH 2.5    Grade 0    Minutes 15    METs 2.8      Recumbant Elliptical   Level 2    RPM 70    Minutes 15      Prescription Details   Frequency (times per week) 2    Duration Progress to 30 minutes of continuous aerobic without signs/symptoms of physical distress      Intensity   THRR 40-80% of Max Heartrate 58-115    Ratings of Perceived Exertion 11-13    Perceived Dyspnea 0-4      Progression   Progression Continue progressive overload as per policy without signs/symptoms or physical distress.      Resistance Training   Training Prescription Yes    Weight blue bands    Reps 10-15             Perform Capillary Blood Glucose checks as needed.  Exercise Prescription Changes:   Exercise Comments:   Exercise Goals and Review:   Exercise Goals     Row Name 05/28/22 0931              Exercise Goals   Increase Physical Activity Yes       Intervention Provide advice, education, support and counseling about physical activity/exercise needs.;Develop an individualized exercise prescription for aerobic and resistive training based on initial evaluation findings, risk stratification, comorbidities and participant's personal goals.       Expected Outcomes Short Term: Attend rehab on a regular basis to increase amount of physical activity.;Long Term: Exercising regularly at least 3-5 days a week.;Long Term: Add in home exercise to make exercise part of routine and to increase amount of physical activity.       Increase Strength and Stamina Yes       Intervention Provide advice, education, support and counseling about physical activity/exercise needs.;Develop an individualized exercise prescription for aerobic and resistive training based on initial evaluation findings, risk stratification, comorbidities and participant's personal goals.       Expected Outcomes Short Term: Increase workloads from initial exercise prescription for resistance, speed, and METs.;Short Term: Perform resistance training exercises routinely during rehab and add in resistance training at home;Long Term: Improve cardiorespiratory fitness, muscular endurance and strength as measured by increased METs and functional capacity (6MWT)       Able to understand and use rate of perceived exertion (RPE) scale Yes       Intervention Provide education and explanation on how to use RPE scale       Expected Outcomes Short Term: Able to use RPE daily in rehab to express subjective intensity level;Long Term:  Able to use RPE to guide intensity level when exercising independently       Able to understand and use Dyspnea scale Yes       Intervention Provide education and explanation on how to use Dyspnea scale       Expected Outcomes Short Term: Able to use Dyspnea scale daily in rehab to express subjective sense of  shortness of breath during exertion;Long Term: Able to use Dyspnea scale to guide intensity level when exercising independently       Knowledge and understanding of Target Heart Rate Range (  THRR) Yes       Intervention Provide education and explanation of THRR including how the numbers were predicted and where they are located for reference       Expected Outcomes Short Term: Able to state/look up THRR;Long Term: Able to use THRR to govern intensity when exercising independently;Short Term: Able to use daily as guideline for intensity in rehab       Understanding of Exercise Prescription Yes       Intervention Provide education, explanation, and written materials on patient's individual exercise prescription       Expected Outcomes Short Term: Able to explain program exercise prescription;Long Term: Able to explain home exercise prescription to exercise independently                Exercise Goals Re-Evaluation :   Discharge Exercise Prescription (Final Exercise Prescription Changes):   Nutrition:  Target Goals: Understanding of nutrition guidelines, daily intake of sodium <1594m, cholesterol <2034m calories 30% from fat and 7% or less from saturated fats, daily to have 5 or more servings of fruits and vegetables.  Biometrics:  Pre Biometrics - 05/28/22 1005       Pre Biometrics   Grip Strength 44 kg              Nutrition Therapy Plan and Nutrition Goals:   Nutrition Assessments:  MEDIFICTS Score Key: ?70 Need to make dietary changes  40-70 Heart Healthy Diet ? 40 Therapeutic Level Cholesterol Diet   Picture Your Plate Scores: <4<62nhealthy dietary pattern with much room for improvement. 41-50 Dietary pattern unlikely to meet recommendations for good health and room for improvement. 51-60 More healthful dietary pattern, with some room for improvement.  >60 Healthy dietary pattern, although there may be some specific behaviors that could be improved.     Nutrition Goals Re-Evaluation:   Nutrition Goals Discharge (Final Nutrition Goals Re-Evaluation):   Psychosocial: Target Goals: Acknowledge presence or absence of significant depression and/or stress, maximize coping skills, provide positive support system. Participant is able to verbalize types and ability to use techniques and skills needed for reducing stress and depression.  Initial Review & Psychosocial Screening:  Initial Psych Review & Screening - 05/28/22 0917       Initial Review   Current issues with Current Psychotropic Meds      Family Dynamics   Good Support System? Yes    Comments wife ChMalachy Mooddaughter      Barriers   Psychosocial barriers to participate in program The patient should benefit from training in stress management and relaxation.;There are no identifiable barriers or psychosocial needs.      Screening Interventions   Interventions Encouraged to exercise    Expected Outcomes Short Term goal: Utilizing psychosocial counselor, staff and physician to assist with identification of specific Stressors or current issues interfering with healing process. Setting desired goal for each stressor or current issue identified.;Long Term Goal: Stressors or current issues are controlled or eliminated.;Short Term goal: Identification and review with participant of any Quality of Life or Depression concerns found by scoring the questionnaire.;Long Term goal: The participant improves quality of Life and PHQ9 Scores as seen by post scores and/or verbalization of changes             Quality of Life Scores:  Scores of 19 and below usually indicate a poorer quality of life in these areas.  A difference of  2-3 points is a clinically meaningful difference.  A difference of 2-3 points in the  total score of the Quality of Life Index has been associated with significant improvement in overall quality of life, self-image, physical symptoms, and general health in studies  assessing change in quality of life.  PHQ-9: Review Flowsheet  More data exists      05/28/2022 04/28/2021 04/24/2020 03/17/2020 01/12/2018  Depression screen PHQ 2/9  Decreased Interest 0 0 0 0 0 0 0  Down, Depressed, Hopeless 0 0 0 0 0 0 0  PHQ - 2 Score 0 0 0 0 0 0 0  Altered sleeping 1 - - - 0  Tired, decreased energy 1 - - - 1  Change in appetite 1 - - - 0  Feeling bad or failure about yourself  0 - - - 0  Trouble concentrating 1 - - - 0  Moving slowly or fidgety/restless 0 - - - 1  Suicidal thoughts 0 - - - 0  PHQ-9 Score 4 - - - 2  Difficult doing work/chores Not difficult at all - - - Not difficult at all   Interpretation of Total Score  Total Score Depression Severity:  1-4 = Minimal depression, 5-9 = Mild depression, 10-14 = Moderate depression, 15-19 = Moderately severe depression, 20-27 = Severe depression   Psychosocial Evaluation and Intervention:  Psychosocial Evaluation - 05/28/22 0919       Psychosocial Evaluation & Interventions   Interventions Encouraged to exercise with the program and follow exercise prescription;Relaxation education;Stress management education    Expected Outcomes For pt to participate in [place]  No Follow up required             Psychosocial Re-Evaluation:   Psychosocial Discharge (Final Psychosocial Re-Evaluation):   Education: Education Goals: Education classes will be provided on a weekly basis, covering required topics. Participant will state understanding/return demonstration of topics presented.  Learning Barriers/Preferences:  Learning Barriers/Preferences - 05/28/22 0920       Learning Barriers/Preferences   Learning Barriers None    Learning Preferences Written Material             Education Topics: Introduction to Pulmonary Rehab Group instruction provided by PowerPoint, verbal discussion, and written material to support subject matter. Instructor reviews what Pulmonary Rehab  is, the purpose of the program, and how patients are referred.     Know Your Numbers Group instruction that is supported by a PowerPoint presentation. Instructor discusses importance of knowing and understanding resting, exercise, and post-exercise oxygen saturation, heart rate, and blood pressure. Oxygen saturation, heart rate, blood pressure, rating of perceived exertion, and dyspnea are reviewed along with a normal range for these values.    Exercise for the Pulmonary Patient Group instruction that is supported by a PowerPoint presentation. Instructor discusses benefits of exercise, core components of exercise, frequency, duration, and intensity of an exercise routine, importance of utilizing pulse oximetry during exercise, safety while exercising, and options of places to exercise outside of rehab.       MET Level  Group instruction provided by PowerPoint, verbal discussion, and written material to support subject matter. Instructor reviews what METs are and how to increase METs.    Pulmonary Medications Verbally interactive group education provided by instructor with focus on inhaled medications and proper administration.   Anatomy and Physiology of the Respiratory System Group instruction provided by PowerPoint, verbal discussion, and written material to support subject matter. Instructor reviews respiratory cycle and anatomical components of the respiratory system and their functions. Instructor also reviews differences in obstructive  and restrictive respiratory diseases with examples of each.    Oxygen Safety Group instruction provided by PowerPoint, verbal discussion, and written material to support subject matter. There is an overview of "What is Oxygen" and "Why do we need it".  Instructor also reviews how to create a safe environment for oxygen use, the importance of using oxygen as prescribed, and the risks of noncompliance. There is a brief discussion on traveling with oxygen  and resources the patient may utilize.   Oxygen Use Group instruction provided by PowerPoint, verbal discussion, and written material to discuss how supplemental oxygen is prescribed and different types of oxygen supply systems. Resources for more information are provided.    Breathing Techniques Group instruction that is supported by demonstration and informational handouts. Instructor discusses the benefits of pursed lip and diaphragmatic breathing and detailed demonstration on how to perform both.     Risk Factor Reduction Group instruction that is supported by a PowerPoint presentation. Instructor discusses the definition of a risk factor, different risk factors for pulmonary disease, and how the heart and lungs work together.   MD Day A group question and answer session with a medical doctor that allows participants to ask questions that relate to their pulmonary disease state.   Nutrition for the Pulmonary Patient Group instruction provided by PowerPoint slides, verbal discussion, and written materials to support subject matter. The instructor gives an explanation and review of healthy diet recommendations, which includes a discussion on weight management, recommendations for fruit and vegetable consumption, as well as protein, fluid, caffeine, fiber, sodium, sugar, and alcohol. Tips for eating when patients are short of breath are discussed.    Other Education Group or individual verbal, written, or video instructions that support the educational goals of the pulmonary rehab program.    Knowledge Questionnaire Score:  Knowledge Questionnaire Score - 05/28/22 0920       Knowledge Questionnaire Score   Pre Score 14/18             Core Components/Risk Factors/Patient Goals at Admission:  Personal Goals and Risk Factors at Admission - 05/28/22 0921       Core Components/Risk Factors/Patient Goals on Admission   Improve shortness of breath with ADL's Yes     Intervention Provide education, individualized exercise plan and daily activity instruction to help decrease symptoms of SOB with activities of daily living.    Expected Outcomes Short Term: Improve cardiorespiratory fitness to achieve a reduction of symptoms when performing ADLs;Long Term: Be able to perform more ADLs without symptoms or delay the onset of symptoms             Core Components/Risk Factors/Patient Goals Review:    Core Components/Risk Factors/Patient Goals at Discharge (Final Review):    ITP Comments:   Comments: Dr. Rodman Pickle is Medical Director for Pulmonary Rehab at [facility].

## 2022-05-28 NOTE — Progress Notes (Signed)
Pulmonary Individual Treatment Plan  Patient Details  Name: Russell Brown MRN: [id number] Date of Birth: 1946/02/08 Referring Provider:   April Manson Pulmonary Rehab Walk Test from 05/28/2022 in [facility], & [place]  Referring Provider Mannam       Initial Encounter Date:  Flowsheet Row Pulmonary Rehab Walk Test from 05/28/2022 in [facility], & [place]  Date 05/28/22       Visit Diagnosis: ILD (interstitial lung disease) ([place])  Patient's Home Medications on Admission:   Current Outpatient Medications:    ANORO ELLIPTA 62.5-25 MCG/ACT AEPB, USE 1 INHALATION BY MOUTH  DAILY, Disp: 180 each, Rfl: 3   aspirin EC 81 MG tablet, Take 81 mg by mouth daily., Disp: , Rfl:    atorvastatin (LIPITOR) 20 MG tablet, Take 1 tablet (20 mg total) by mouth daily., Disp: 90 tablet, Rfl: 3   calcium citrate (CALCITRATE - DOSED IN MG ELEMENTAL CALCIUM) 950 (200 Ca) MG tablet, Take 600 mg of elemental calcium by mouth daily., Disp: , Rfl:    cholecalciferol (VITAMIN D3) 25 MCG (1000 UNIT) tablet, Take 800 Units by mouth daily., Disp: , Rfl:    dutasteride (AVODART) 0.5 MG capsule, Take 1 capsule (0.5 mg total) by mouth daily., Disp: 90 capsule, Rfl: 3   fish oil-omega-3 fatty acids 1000 MG capsule, Take 2 g by mouth daily. , Disp: , Rfl:    levothyroxine (SYNTHROID) 75 MCG tablet, TAKE 1 TABLET BY MOUTH DAILY  BEFORE BREAKFAST, Disp: 90 tablet, Rfl: 0   lisinopril-hydrochlorothiazide (ZESTORETIC) 10-12.5 MG tablet, TAKE 1 TABLET BY MOUTH DAILY, Disp: 90 tablet, Rfl: 0   Multiple Vitamin (MULTIVITAMIN WITH MINERALS) TABS tablet, Take 1 tablet by mouth daily., Disp: , Rfl:    nitroGLYCERIN (NITROSTAT) 0.4 MG SL tablet, PLACE 1 TABLET UNDER THE TONGUE EVERY 5 MINUTES AS NEEDED, Disp: 25 tablet, Rfl: 3   omeprazole (PRILOSEC) 40 MG capsule, Take 1 capsule (40 mg total) by mouth 2 (two) times daily., Disp:  180 capsule, Rfl: 3   sertraline (ZOLOFT) 50 MG tablet, Take 1.5 tablets (75 mg total) by mouth daily., Disp: 135 tablet, Rfl: 3   Nintedanib (OFEV) 150 MG CAPS, Take 1 capsule (150 mg total) by mouth 2 (two) times daily. 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   COPD (chronic obstructive pulmonary disease) (HCC)    GERD (gastroesophageal reflux disease)    History of hiatal hernia    Hyperlipemia    Hypertension    Hypothyroidism    Pneumonia 2013; 2017   PTSD (post-traumatic stress disorder)    retired Engineer, structural    Pulmonary fibrosis ([place])    Sleep apnea    mild - but does not wear a c-pap per pt   Thyroid disease    thyroid nodules - half of thyroid was removed per pt    Tobacco Use: Social History   Tobacco Use  Smoking Status Former   Packs/day: 1.00   Years: 35.00   Total pack years: 35.00   Types: Cigarettes   Quit date: 08/18/1996   Years since quitting: 25.7   Passive exposure: Past  Smokeless Tobacco Never    Labs: Review Flowsheet  More  data exists      Latest Ref Rng & Units 02/21/2018 03/16/2019 03/17/2020 05/06/2021 05/05/2022  Labs for ITP Cardiac and Pulmonary Rehab  Cholestrol 0 - 200 mg/dL 126  141  132  134  124   LDL (calc) 0 - 99 mg/dL 68  61  61  55  46   HDL-C >39.00 mg/dL 41  41.50  40.20  46.50  46.90   Trlycerides 0.0 - 149.0 mg/dL 85  194.0  157.0  161.0  154.0     Capillary Blood Glucose: Lab Results  Component Value Date   GLUCAP 100 (H) 02/20/2018     Pulmonary Assessment Scores:  Pulmonary Assessment Scores     Row Name 05/28/22 0923         ADL UCSD   ADL Phase Entry     SOB Score total 21       CAT Score   CAT Score 21       mMRC Score   mMRC Score 2             UCSD: Self-administered rating of  dyspnea associated with activities of daily living (ADLs) 6-point scale (0 = "not at all" to 5 = "maximal or unable to do because of breathlessness")  Scoring Scores range from 0 to 120.  Minimally important difference is 5 units  CAT: CAT can identify the health impairment of COPD patients and is better correlated with disease progression.  CAT has a scoring range of zero to 40. The CAT score is classified into four groups of low (less than 10), medium (10 - 20), high (21-30) and very high (31-40) based on the impact level of disease on health status. A CAT score over 10 suggests significant symptoms.  A worsening CAT score could be explained by an exacerbation, poor medication adherence, poor inhaler technique, or progression of COPD or comorbid conditions.  CAT MCID is 2 points  mMRC: mMRC (Modified Medical Research Council) Dyspnea Scale is used to assess the degree of baseline functional disability in patients of respiratory disease due to dyspnea. No minimal important difference is established. A decrease in score of 1 point or greater is considered a positive change.   Pulmonary Function Assessment:  Pulmonary Function Assessment - 05/28/22 0958       Breath   Bilateral Breath Sounds Decreased    Shortness of Breath Limiting activity             Exercise Target Goals: Exercise Program Goal: Individual exercise prescription set using results from initial 6 min walk test and THRR while considering  patient's activity barriers and safety.   Exercise Prescription Goal: Initial exercise prescription builds to 30-45 minutes a day of aerobic activity, 2-3 days per week.  Home exercise guidelines will be given to patient during program as part of exercise prescription that the participant will acknowledge.  Activity Barriers & Risk Stratification:  Activity Barriers & Cardiac Risk Stratification - 05/28/22 0929       Activity Barriers & Cardiac Risk Stratification   Activity  Barriers Arthritis;Muscular Weakness;Shortness of Breath;Deconditioning    Cardiac Risk Stratification Moderate             6 Minute Walk:  6 Minute Walk     Row Name 05/28/22 1000         6 Minute Walk   Phase Initial     Distance 1490 feet     Walk Time 6 minutes     # of  Rest Breaks 0     MPH 2.82     METS 3.69     RPE 11     Perceived Dyspnea  1     VO2 Peak 12.92     Symptoms No     Resting HR 78 bpm     Resting BP 142/70     Resting Oxygen Saturation  95 %     Exercise Oxygen Saturation  during 6 min walk 90 %     Max Ex. HR 111 bpm     Max Ex. BP 170/62     2 Minute Post BP 150/64       Interval HR   1 Minute HR 88     2 Minute HR 88     3 Minute HR 98     4 Minute HR 102     5 Minute HR 106     6 Minute HR 111     2 Minute Post HR 88     Interval Heart Rate? Yes       Interval Oxygen   Interval Oxygen? Yes     Baseline Oxygen Saturation % 95 %     1 Minute Oxygen Saturation % 94 %     1 Minute Liters of Oxygen 0 L     2 Minute Oxygen Saturation % 93 %     2 Minute Liters of Oxygen 0 L     3 Minute Oxygen Saturation % 90 %     3 Minute Liters of Oxygen 0 L     4 Minute Oxygen Saturation % 91 %     4 Minute Liters of Oxygen 0 L     5 Minute Oxygen Saturation % 91 %     5 Minute Liters of Oxygen 0 L     6 Minute Oxygen Saturation % 91 %     6 Minute Liters of Oxygen 0 L     2 Minute Post Oxygen Saturation % 95 %     2 Minute Post Liters of Oxygen 0 L              Oxygen Initial Assessment:  Oxygen Initial Assessment - 05/28/22 0923       Home Oxygen   Home Oxygen Device None    Sleep Oxygen Prescription None    Home Exercise Oxygen Prescription None    Home Resting Oxygen Prescription None    Compliance with Home Oxygen Use Yes      Initial 6 min Walk   Oxygen Used None      Program Oxygen Prescription   Program Oxygen Prescription None      Intervention   Short Term Goals To learn and exhibit compliance with exercise, home  and travel O2 prescription;To learn and understand importance of maintaining oxygen saturations>88%;To learn and demonstrate proper use of respiratory medications;To learn and understand importance of monitoring SPO2 with pulse oximeter and demonstrate accurate use of the pulse oximeter.;To learn and demonstrate proper pursed lip breathing techniques or other breathing techniques. ;Other    Long  Term Goals Demonstrates proper use of MDI's;Compliance with respiratory medication;Exhibits proper breathing techniques, such as pursed lip breathing or other method taught during program session;Maintenance of O2 saturations>88%;Verbalizes importance of monitoring SPO2 with pulse oximeter and return demonstration;Exhibits compliance with exercise, home  and travel O2 prescription             Oxygen Re-Evaluation:   Oxygen Discharge (Final Oxygen Re-Evaluation):  Initial Exercise Prescription:  Initial Exercise Prescription - 05/28/22 1000       Date of Initial Exercise RX and Referring Provider   Date 05/28/22    Referring Provider Mannam    Expected Discharge Date 08/05/22      Treadmill   MPH 2.5    Grade 0    Minutes 15    METs 2.8      Recumbant Elliptical   Level 2    RPM 70    Minutes 15      Prescription Details   Frequency (times per week) 2    Duration Progress to 30 minutes of continuous aerobic without signs/symptoms of physical distress      Intensity   THRR 40-80% of Max Heartrate 58-115    Ratings of Perceived Exertion 11-13    Perceived Dyspnea 0-4      Progression   Progression Continue progressive overload as per policy without signs/symptoms or physical distress.      Resistance Training   Training Prescription Yes    Weight blue bands    Reps 10-15             Perform Capillary Blood Glucose checks as needed.  Exercise Prescription Changes:   Exercise Comments:   Exercise Goals and Review:   Exercise Goals     Row Name 05/28/22 0931              Exercise Goals   Increase Physical Activity Yes       Intervention Provide advice, education, support and counseling about physical activity/exercise needs.;Develop an individualized exercise prescription for aerobic and resistive training based on initial evaluation findings, risk stratification, comorbidities and participant's personal goals.       Expected Outcomes Short Term: Attend rehab on a regular basis to increase amount of physical activity.;Long Term: Exercising regularly at least 3-5 days a week.;Long Term: Add in home exercise to make exercise part of routine and to increase amount of physical activity.       Increase Strength and Stamina Yes       Intervention Provide advice, education, support and counseling about physical activity/exercise needs.;Develop an individualized exercise prescription for aerobic and resistive training based on initial evaluation findings, risk stratification, comorbidities and participant's personal goals.       Expected Outcomes Short Term: Increase workloads from initial exercise prescription for resistance, speed, and METs.;Short Term: Perform resistance training exercises routinely during rehab and add in resistance training at home;Long Term: Improve cardiorespiratory fitness, muscular endurance and strength as measured by increased METs and functional capacity (6MWT)       Able to understand and use rate of perceived exertion (RPE) scale Yes       Intervention Provide education and explanation on how to use RPE scale       Expected Outcomes Short Term: Able to use RPE daily in rehab to express subjective intensity level;Long Term:  Able to use RPE to guide intensity level when exercising independently       Able to understand and use Dyspnea scale Yes       Intervention Provide education and explanation on how to use Dyspnea scale       Expected Outcomes Short Term: Able to use Dyspnea scale daily in rehab to express subjective sense of  shortness of breath during exertion;Long Term: Able to use Dyspnea scale to guide intensity level when exercising independently       Knowledge and understanding of Target Heart Rate Range (  THRR) Yes       Intervention Provide education and explanation of THRR including how the numbers were predicted and where they are located for reference       Expected Outcomes Short Term: Able to state/look up THRR;Long Term: Able to use THRR to govern intensity when exercising independently;Short Term: Able to use daily as guideline for intensity in rehab       Understanding of Exercise Prescription Yes       Intervention Provide education, explanation, and written materials on patient's individual exercise prescription       Expected Outcomes Short Term: Able to explain program exercise prescription;Long Term: Able to explain home exercise prescription to exercise independently                Exercise Goals Re-Evaluation :   Discharge Exercise Prescription (Final Exercise Prescription Changes):   Nutrition:  Target Goals: Understanding of nutrition guidelines, daily intake of sodium <1523m, cholesterol <2015m calories 30% from fat and 7% or less from saturated fats, daily to have 5 or more servings of fruits and vegetables.  Biometrics:  Pre Biometrics - 05/28/22 1005       Pre Biometrics   Grip Strength 44 kg              Nutrition Therapy Plan and Nutrition Goals:   Nutrition Assessments:  MEDIFICTS Score Key: ?70 Need to make dietary changes  40-70 Heart Healthy Diet ? 40 Therapeutic Level Cholesterol Diet   Picture Your Plate Scores: <4<62nhealthy dietary pattern with much room for improvement. 41-50 Dietary pattern unlikely to meet recommendations for good health and room for improvement. 51-60 More healthful dietary pattern, with some room for improvement.  >60 Healthy dietary pattern, although there may be some specific behaviors that could be improved.     Nutrition Goals Re-Evaluation:   Nutrition Goals Discharge (Final Nutrition Goals Re-Evaluation):   Psychosocial: Target Goals: Acknowledge presence or absence of significant depression and/or stress, maximize coping skills, provide positive support system. Participant is able to verbalize types and ability to use techniques and skills needed for reducing stress and depression.  Initial Review & Psychosocial Screening:  Initial Psych Review & Screening - 05/28/22 0917       Initial Review   Current issues with Current Psychotropic Meds      Family Dynamics   Good Support System? Yes    Comments wife ChMalachy Mooddaughter      Barriers   Psychosocial barriers to participate in program The patient should benefit from training in stress management and relaxation.;There are no identifiable barriers or psychosocial needs.      Screening Interventions   Interventions Encouraged to exercise    Expected Outcomes Short Term goal: Utilizing psychosocial counselor, staff and physician to assist with identification of specific Stressors or current issues interfering with healing process. Setting desired goal for each stressor or current issue identified.;Long Term Goal: Stressors or current issues are controlled or eliminated.;Short Term goal: Identification and review with participant of any Quality of Life or Depression concerns found by scoring the questionnaire.;Long Term goal: The participant improves quality of Life and PHQ9 Scores as seen by post scores and/or verbalization of changes             Quality of Life Scores:  Scores of 19 and below usually indicate a poorer quality of life in these areas.  A difference of  2-3 points is a clinically meaningful difference.  A difference of 2-3 points in the  total score of the Quality of Life Index has been associated with significant improvement in overall quality of life, self-image, physical symptoms, and general health in studies  assessing change in quality of life.  PHQ-9: Review Flowsheet  More data exists      05/28/2022 04/28/2021 04/24/2020 03/17/2020 01/12/2018  Depression screen PHQ 2/9  Decreased Interest 0 0 0 0 0 0 0  Down, Depressed, Hopeless 0 0 0 0 0 0 0  PHQ - 2 Score 0 0 0 0 0 0 0  Altered sleeping 1 - - - 0  Tired, decreased energy 1 - - - 1  Change in appetite 1 - - - 0  Feeling bad or failure about yourself  0 - - - 0  Trouble concentrating 1 - - - 0  Moving slowly or fidgety/restless 0 - - - 1  Suicidal thoughts 0 - - - 0  PHQ-9 Score 4 - - - 2  Difficult doing work/chores Not difficult at all - - - Not difficult at all   Interpretation of Total Score  Total Score Depression Severity:  1-4 = Minimal depression, 5-9 = Mild depression, 10-14 = Moderate depression, 15-19 = Moderately severe depression, 20-27 = Severe depression   Psychosocial Evaluation and Intervention:  Psychosocial Evaluation - 05/28/22 0919       Psychosocial Evaluation & Interventions   Interventions Encouraged to exercise with the program and follow exercise prescription;Relaxation education;Stress management education    Expected Outcomes For pt to participate in [place]  No Follow up required             Psychosocial Re-Evaluation:   Psychosocial Discharge (Final Psychosocial Re-Evaluation):   Education: Education Goals: Education classes will be provided on a weekly basis, covering required topics. Participant will state understanding/return demonstration of topics presented.  Learning Barriers/Preferences:  Learning Barriers/Preferences - 05/28/22 0920       Learning Barriers/Preferences   Learning Barriers None    Learning Preferences Written Material             Education Topics: Introduction to Pulmonary Rehab Group instruction provided by PowerPoint, verbal discussion, and written material to support subject matter. Instructor reviews what Pulmonary Rehab  is, the purpose of the program, and how patients are referred.     Know Your Numbers Group instruction that is supported by a PowerPoint presentation. Instructor discusses importance of knowing and understanding resting, exercise, and post-exercise oxygen saturation, heart rate, and blood pressure. Oxygen saturation, heart rate, blood pressure, rating of perceived exertion, and dyspnea are reviewed along with a normal range for these values.    Exercise for the Pulmonary Patient Group instruction that is supported by a PowerPoint presentation. Instructor discusses benefits of exercise, core components of exercise, frequency, duration, and intensity of an exercise routine, importance of utilizing pulse oximetry during exercise, safety while exercising, and options of places to exercise outside of rehab.       MET Level  Group instruction provided by PowerPoint, verbal discussion, and written material to support subject matter. Instructor reviews what METs are and how to increase METs.    Pulmonary Medications Verbally interactive group education provided by instructor with focus on inhaled medications and proper administration.   Anatomy and Physiology of the Respiratory System Group instruction provided by PowerPoint, verbal discussion, and written material to support subject matter. Instructor reviews respiratory cycle and anatomical components of the respiratory system and their functions. Instructor also reviews differences in obstructive  and restrictive respiratory diseases with examples of each.    Oxygen Safety Group instruction provided by PowerPoint, verbal discussion, and written material to support subject matter. There is an overview of "What is Oxygen" and "Why do we need it".  Instructor also reviews how to create a safe environment for oxygen use, the importance of using oxygen as prescribed, and the risks of noncompliance. There is a brief discussion on traveling with oxygen  and resources the patient may utilize.   Oxygen Use Group instruction provided by PowerPoint, verbal discussion, and written material to discuss how supplemental oxygen is prescribed and different types of oxygen supply systems. Resources for more information are provided.    Breathing Techniques Group instruction that is supported by demonstration and informational handouts. Instructor discusses the benefits of pursed lip and diaphragmatic breathing and detailed demonstration on how to perform both.     Risk Factor Reduction Group instruction that is supported by a PowerPoint presentation. Instructor discusses the definition of a risk factor, different risk factors for pulmonary disease, and how the heart and lungs work together.   MD Day A group question and answer session with a medical doctor that allows participants to ask questions that relate to their pulmonary disease state.   Nutrition for the Pulmonary Patient Group instruction provided by PowerPoint slides, verbal discussion, and written materials to support subject matter. The instructor gives an explanation and review of healthy diet recommendations, which includes a discussion on weight management, recommendations for fruit and vegetable consumption, as well as protein, fluid, caffeine, fiber, sodium, sugar, and alcohol. Tips for eating when patients are short of breath are discussed.    Other Education Group or individual verbal, written, or video instructions that support the educational goals of the pulmonary rehab program.    Knowledge Questionnaire Score:  Knowledge Questionnaire Score - 05/28/22 0920       Knowledge Questionnaire Score   Pre Score 14/18             Core Components/Risk Factors/Patient Goals at Admission:  Personal Goals and Risk Factors at Admission - 05/28/22 0921       Core Components/Risk Factors/Patient Goals on Admission   Improve shortness of breath with ADL's Yes     Intervention Provide education, individualized exercise plan and daily activity instruction to help decrease symptoms of SOB with activities of daily living.    Expected Outcomes Short Term: Improve cardiorespiratory fitness to achieve a reduction of symptoms when performing ADLs;Long Term: Be able to perform more ADLs without symptoms or delay the onset of symptoms             Core Components/Risk Factors/Patient Goals Review:    Core Components/Risk Factors/Patient Goals at Discharge (Final Review):    ITP Comments:   Comments: Dr. Rodman Pickle is Medical Director for Pulmonary Rehab at [facility].

## 2022-05-28 NOTE — Progress Notes (Signed)
Russell Brown 76 y.o. male Pulmonary Rehab Orientation Note This patient who was referred to Pulmonary Rehab by Dr. Vaughan Browner with the diagnosis of ILD arrived today in Cardiac and Pulmonary Rehab. He arrived ambulatory with normal gait.  Pt is not requiring oxygen supplementation. Color good, skin warm and dry. Patient is oriented to time and place. Patient's medical history, psychosocial health, and medications reviewed. Psychosocial assessment reveals patient lives with spouse. Russell Brown is currently retired. Patient hobbies include  hiking, woodworking, Economist . Patient reports his stress level is low. Areas of stress/anxiety include health. Patient does not exhibit signs of depression.  PHQ2/9 score 0/4. Russell Brown shows good  coping skills with positive outlook on life. Offered emotional support and reassurance. Will continue to monitor and evaluate progress toward psychosocial goal(s) of managing mental health. Physical assessment performed by Janine Ores RN. Please see their orientation physical assessment note. Russell Brown reports he  does take medications as prescribed. Patient states he  follows a low fat  diet. The patient has been trying to gain weight by drinking Ensure.. Patient's weight will be monitored closely. Demonstration and practice of PLB using pulse oximeter. Russell Brown able to return demonstration satisfactorily. Safety and hand hygiene in the exercise area reviewed with patient. Russell Brown voices understanding of the information reviewed. Department expectations discussed with patient and achievable goals were set. The patient shows enthusiasm about attending the program and we look forward to working with Russell Brown. Russell Brown completed a 6 min walk test today and is scheduled to begin exercise on 06/01/22 at 1015.   [id number] Russell Brown, BS, ACSM-CEP

## 2022-06-03 ENCOUNTER — Encounter (HOSPITAL_COMMUNITY)
Admission: RE | Admit: 2022-06-03 | Discharge: 2022-06-03 | Disposition: A | Discharge status: Home / Self Care | Payer: Medicare Other | Source: Ambulatory Visit | Attending: Pulmonary Disease | Admitting: Pulmonary Disease

## 2022-06-03 DIAGNOSIS — J849 Interstitial pulmonary disease, unspecified: Secondary | ICD-10-CM | POA: Diagnosis not present

## 2022-06-03 NOTE — Progress Notes (Signed)
Daily Session Note  Patient Details  Name: Russell Brown MRN: [id number] Date of Birth: December 29, 1945 Referring Provider:   April Manson Pulmonary Rehab Walk Test from 05/28/2022 in [facility] for [facility], V[facility], & [place]  Referring Provider Mannam       Encounter Date: 06/03/2022  Check In:  Session Check In - 06/03/22 1135       Check-In   Supervising physician immediately available to respond to emergencies [facility] - Physician supervision    Physician(s) Dr. Tacy Learn    Location [facility]    Staff Present Elmon Else, MS, ACSM-CEP, Exercise Physiologist;Whittley Carandang Yevonne Pax, ACSM-CEP, Exercise Physiologist;Lisa Ysidro Evert, RN;Other    Virtual Visit No    Medication changes reported     No    Fall or balance concerns reported    No    Tobacco Cessation No Change    Warm-up and Cool-down Performed as group-led instruction    Resistance Training Performed Yes    VAD Patient? No    PAD/SET Patient? No      Pain Assessment   Currently in Pain? No/denies             Capillary Blood Glucose: No results found for this or any previous visit (from the past 24 hour(s)).    Social History   Tobacco Use  Smoking Status Former   Packs/day: 1.00   Years: 35.00   Total pack years: 35.00   Types: Cigarettes   Quit date: 08/18/1996   Years since quitting: 25.8   Passive exposure: Past  Smokeless Tobacco Never    Goals Met:  Exercise tolerated well No report of concerns or symptoms today Strength training completed today  Goals Unmet:  Not Applicable  Comments: Service time is from 1022 to 1210.    Dr. Rodman Pickle is Medical Director for Pulmonary Rehab at [facility].

## 2022-06-08 ENCOUNTER — Encounter (HOSPITAL_COMMUNITY)
Admission: RE | Admit: 2022-06-08 | Discharge: 2022-06-08 | Disposition: A | Discharge status: Home / Self Care | Payer: Medicare Other | Source: Ambulatory Visit | Attending: Pulmonary Disease | Admitting: Pulmonary Disease

## 2022-06-08 VITALS — Wt 176.6 lb

## 2022-06-08 DIAGNOSIS — J849 Interstitial pulmonary disease, unspecified: Secondary | ICD-10-CM | POA: Diagnosis not present

## 2022-06-08 NOTE — Progress Notes (Signed)
Daily Session Note  Patient Details  Name: Russell Brown MRN: [id number] Date of Birth: 1945-09-15 Referring Provider:   April Manson Pulmonary Rehab Walk Test from 05/28/2022 in [facility] for Heart, Vascular, & [place]  Referring Provider Mannam       Encounter Date: 06/08/2022  Check In:  Session Check In - 06/08/22 1134       Check-In   Supervising physician immediately available to respond to emergencies [facility] - Physician supervision    Physician(s) Dr. Ander Slade    Location [facility]    Staff Present Elmon Else, MS, ACSM-CEP, Exercise Physiologist;Randi Yevonne Pax, ACSM-CEP, Exercise Physiologist;Other    Virtual Visit No    Medication changes reported     No    Fall or balance concerns reported    No    Tobacco Cessation No Change    Warm-up and Cool-down Performed as group-led instruction    Resistance Training Performed Yes    VAD Patient? No    PAD/SET Patient? No      Pain Assessment   Currently in Pain? No/denies             Capillary Blood Glucose: No results found for this or any previous visit (from the past 24 hour(s)).   Exercise Prescription Changes - 06/08/22 1200       Response to Exercise   Blood Pressure (Admit) 126/84    Blood Pressure (Exercise) 138/72    Blood Pressure (Exit) 112/58    Heart Rate (Admit) 89 bpm    Heart Rate (Exercise) 112 bpm    Heart Rate (Exit) 85 bpm    Oxygen Saturation (Admit) 94 %    Oxygen Saturation (Exercise) 89 %    Oxygen Saturation (Exit) 97 %    Rating of Perceived Exertion (Exercise) 11    Perceived Dyspnea (Exercise) 1      Resistance Training   Training Prescription Yes    Weight blue bands    Reps 10-15      Treadmill   MPH 2.4    Grade 0    Minutes 15    METs 2.84      Recumbant Elliptical   Level 2    RPM 70    Minutes 15    METs 2.5             Social History   Tobacco Use  Smoking Status Former   Packs/day: 1.00    Years: 35.00   Total pack years: 35.00   Types: Cigarettes   Quit date: 08/18/1996   Years since quitting: 25.8   Passive exposure: Past  Smokeless Tobacco Never    Goals Met:  Independence with exercise equipment Exercise tolerated well No report of concerns or symptoms today Strength training completed today  Goals Unmet:  Not Applicable  Comments: Service time is from 1012 to 1148    Dr. Rodman Pickle is Medical Director for Pulmonary Rehab at [facility].

## 2022-06-09 NOTE — Progress Notes (Signed)
Pulmonary Individual Treatment Plan  Patient Details  Name: Russell Brown MRN: [id number] Date of Birth: [birth year] Referring Provider:   April Manson Pulmonary Rehab Walk Test from 05/28/2022 in [facility] for [facility], V[facility], & [place]  Referring Provider Mannam       Initial Encounter Date:  Flowsheet Row Pulmonary Rehab Walk Test from 05/28/2022 in [facility] for Heart, Vascular, & [place]  Date 05/28/22       Visit Diagnosis: ILD (interstitial lung disease) ([place])  Patient's Home Medications on Admission:   Current Outpatient Medications:    ANORO ELLIPTA 62.5-25 MCG/ACT AEPB, USE 1 INHALATION BY MOUTH  DAILY, Disp: 180 each, Rfl: 3   aspirin EC 81 MG tablet, Take 81 mg by mouth daily., Disp: , Rfl:    atorvastatin (LIPITOR) 20 MG tablet, Take 1 tablet (20 mg total) by mouth daily., Disp: 90 tablet, Rfl: 3   calcium citrate (CALCITRATE - DOSED IN MG ELEMENTAL CALCIUM) 950 (200 Ca) MG tablet, Take 600 mg of elemental calcium by mouth daily., Disp: , Rfl:    cholecalciferol (VITAMIN D3) 25 MCG (1000 UNIT) tablet, Take 800 Units by mouth daily., Disp: , Rfl:    dutasteride (AVODART) 0.5 MG capsule, Take 1 capsule (0.5 mg total) by mouth daily., Disp: 90 capsule, Rfl: 3   fish oil-omega-3 fatty acids 1000 MG capsule, Take 2 g by mouth daily. , Disp: , Rfl:    levothyroxine (SYNTHROID) 75 MCG tablet, TAKE 1 TABLET BY MOUTH DAILY  BEFORE BREAKFAST, Disp: 90 tablet, Rfl: 0   lisinopril-hydrochlorothiazide (ZESTORETIC) 10-12.5 MG tablet, TAKE 1 TABLET BY MOUTH DAILY, Disp: 90 tablet, Rfl: 0   Multiple Vitamin (MULTIVITAMIN WITH MINERALS) TABS tablet, Take 1 tablet by mouth daily., Disp: , Rfl:    Nintedanib (OFEV) 150 MG CAPS, Take 1 capsule (150 mg total) by mouth 2 (two) times daily. (Patient not taking: Reported on 05/28/2022), Disp: 180 capsule, Rfl: 1   nitroGLYCERIN (NITROSTAT) 0.4 MG SL tablet, PLACE 1 TABLET UNDER THE  TONGUE EVERY 5 MINUTES AS NEEDED, Disp: 25 tablet, Rfl: 3   omeprazole (PRILOSEC) 40 MG capsule, Take 1 capsule (40 mg total) by mouth 2 (two) times daily., Disp: 180 capsule, Rfl: 3   sertraline (ZOLOFT) 50 MG tablet, Take 1.5 tablets (75 mg total) by mouth daily., Disp: 135 tablet, Rfl: 3  Past Medical History: Past Medical History:  Diagnosis Date   Arthritis    osteo arthritis left wrist , 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   COPD (chronic obstructive pulmonary disease) (HCC)    GERD (gastroesophageal reflux disease)    History of hiatal hernia    Hyperlipemia    Hypertension    Hypothyroidism    Pneumonia 2013; 2017   PTSD (post-traumatic stress disorder)    retired Engineer, structural    Pulmonary fibrosis ([place])    Sleep apnea    mild - but does not wear a c-pap per pt   Thyroid disease    thyroid nodules - half of thyroid was removed per pt    Tobacco Use: Social History   Tobacco Use  Smoking Status Former   Packs/day: 1.00   Years: 35.00   Total pack years: 35.00   Types: Cigarettes   Quit date: 08/18/1996   Years since quitting: 25.8   Passive exposure: Past  Smokeless Tobacco Never    Labs: Review Flowsheet  More  data exists      Latest Ref Rng & Units 02/21/2018 03/16/2019 03/17/2020 05/06/2021 05/05/2022  Labs for ITP Cardiac and Pulmonary Rehab  Cholestrol 0 - 200 mg/dL 126  141  132  134  124   LDL (calc) 0 - 99 mg/dL 68  61  61  55  46   HDL-C >39.00 mg/dL 41  41.50  40.20  46.50  46.90   Trlycerides 0.0 - 149.0 mg/dL 85  194.0  157.0  161.0  154.0     Capillary Blood Glucose: Lab Results  Component Value Date   GLUCAP 100 (H) 02/20/2018     Pulmonary Assessment Scores:  Pulmonary Assessment Scores     Row Name 05/28/22 0923         ADL UCSD   ADL Phase Entry     SOB Score total 21       CAT Score   CAT Score 21       mMRC Score   mMRC Score 2             UCSD: Self-administered rating of  dyspnea associated with activities of daily living (ADLs) 6-point scale (0 = "not at all" to 5 = "maximal or unable to do because of breathlessness")  Scoring Scores range from 0 to 120.  Minimally important difference is 5 units  CAT: CAT can identify the health impairment of COPD patients and is better correlated with disease progression.  CAT has a scoring range of zero to 40. The CAT score is classified into four groups of low (less than 10), medium (10 - 20), high (21-30) and very high (31-40) based on the impact level of disease on health status. A CAT score over 10 suggests significant symptoms.  A worsening CAT score could be explained by an exacerbation, poor medication adherence, poor inhaler technique, or progression of COPD or comorbid conditions.  CAT MCID is 2 points  mMRC: mMRC (Modified Medical Research Council) Dyspnea Scale is used to assess the degree of baseline functional disability in patients of respiratory disease due to dyspnea. No minimal important difference is established. A decrease in score of 1 point or greater is considered a positive change.   Pulmonary Function Assessment:  Pulmonary Function Assessment - 05/28/22 0958       Breath   Bilateral Breath Sounds Decreased    Shortness of Breath Limiting activity             Exercise Target Goals: Exercise Program Goal: Individual exercise prescription set using results from initial 6 min walk test and THRR while considering  patient's activity barriers and safety.   Exercise Prescription Goal: Initial exercise prescription builds to 30-45 minutes a day of aerobic activity, 2-3 days per week.  Home exercise guidelines will be given to patient during program as part of exercise prescription that the participant will acknowledge.  Activity Barriers & Risk Stratification:  Activity Barriers & Cardiac Risk Stratification - 05/28/22 0929       Activity Barriers & Cardiac Risk Stratification   Activity  Barriers Arthritis;Muscular Weakness;Shortness of Breath;Deconditioning    Cardiac Risk Stratification Moderate             6 Minute Walk:  6 Minute Walk     Row Name 05/28/22 1000         6 Minute Walk   Phase Initial     Distance 1490 feet     Walk Time 6 minutes     # of  Rest Breaks 0     MPH 2.82     METS 3.69     RPE 11     Perceived Dyspnea  1     VO2 Peak 12.92     Symptoms No     Resting HR 78 bpm     Resting BP 142/70     Resting Oxygen Saturation  95 %     Exercise Oxygen Saturation  during 6 min walk 90 %     Max Ex. HR 111 bpm     Max Ex. BP 170/62     2 Minute Post BP 150/64       Interval HR   1 Minute HR 88     2 Minute HR 88     3 Minute HR 98     4 Minute HR 102     5 Minute HR 106     6 Minute HR 111     2 Minute Post HR 88     Interval Heart Rate? Yes       Interval Oxygen   Interval Oxygen? Yes     Baseline Oxygen Saturation % 95 %     1 Minute Oxygen Saturation % 94 %     1 Minute Liters of Oxygen 0 L     2 Minute Oxygen Saturation % 93 %     2 Minute Liters of Oxygen 0 L     3 Minute Oxygen Saturation % 90 %     3 Minute Liters of Oxygen 0 L     4 Minute Oxygen Saturation % 91 %     4 Minute Liters of Oxygen 0 L     5 Minute Oxygen Saturation % 91 %     5 Minute Liters of Oxygen 0 L     6 Minute Oxygen Saturation % 91 %     6 Minute Liters of Oxygen 0 L     2 Minute Post Oxygen Saturation % 95 %     2 Minute Post Liters of Oxygen 0 L              Oxygen Initial Assessment:  Oxygen Initial Assessment - 05/28/22 0923       Home Oxygen   Home Oxygen Device None    Sleep Oxygen Prescription None    Home Exercise Oxygen Prescription None    Home Resting Oxygen Prescription None    Compliance with Home Oxygen Use Yes      Initial 6 min Walk   Oxygen Used None      Program Oxygen Prescription   Program Oxygen Prescription None      Intervention   Short Term Goals To learn and exhibit compliance with exercise, home  and travel O2 prescription;To learn and understand importance of maintaining oxygen saturations>88%;To learn and demonstrate proper use of respiratory medications;To learn and understand importance of monitoring SPO2 with pulse oximeter and demonstrate accurate use of the pulse oximeter.;To learn and demonstrate proper pursed lip breathing techniques or other breathing techniques. ;Other    Long  Term Goals Demonstrates proper use of MDI's;Compliance with respiratory medication;Exhibits proper breathing techniques, such as pursed lip breathing or other method taught during program session;Maintenance of O2 saturations>88%;Verbalizes importance of monitoring SPO2 with pulse oximeter and return demonstration;Exhibits compliance with exercise, home  and travel O2 prescription             Oxygen Re-Evaluation:  Oxygen Re-Evaluation     Row Name  06/03/22 1552             Program Oxygen Prescription   Program Oxygen Prescription None         Home Oxygen   Home Oxygen Device None       Sleep Oxygen Prescription None       Home Exercise Oxygen Prescription None       Home Resting Oxygen Prescription None       Compliance with Home Oxygen Use Yes         Goals/Expected Outcomes   Short Term Goals To learn and exhibit compliance with exercise, home and travel O2 prescription;To learn and understand importance of maintaining oxygen saturations>88%;To learn and demonstrate proper use of respiratory medications;To learn and understand importance of monitoring SPO2 with pulse oximeter and demonstrate accurate use of the pulse oximeter.;To learn and demonstrate proper pursed lip breathing techniques or other breathing techniques. ;Other       Long  Term Goals Demonstrates proper use of MDI's;Compliance with respiratory medication;Exhibits proper breathing techniques, such as pursed lip breathing or other method taught during program session;Maintenance of O2 saturations>88%;Verbalizes importance of  monitoring SPO2 with pulse oximeter and return demonstration;Exhibits compliance with exercise, home  and travel O2 prescription       Goals/Expected Outcomes Compliance and understanding of oxygen saturation monitoring and breathing techniques to decrease shortness of breath.                Oxygen Discharge (Final Oxygen Re-Evaluation):  Oxygen Re-Evaluation - 06/03/22 1552       Program Oxygen Prescription   Program Oxygen Prescription None      Home Oxygen   Home Oxygen Device None    Sleep Oxygen Prescription None    Home Exercise Oxygen Prescription None    Home Resting Oxygen Prescription None    Compliance with Home Oxygen Use Yes      Goals/Expected Outcomes   Short Term Goals To learn and exhibit compliance with exercise, home and travel O2 prescription;To learn and understand importance of maintaining oxygen saturations>88%;To learn and demonstrate proper use of respiratory medications;To learn and understand importance of monitoring SPO2 with pulse oximeter and demonstrate accurate use of the pulse oximeter.;To learn and demonstrate proper pursed lip breathing techniques or other breathing techniques. ;Other    Long  Term Goals Demonstrates proper use of MDI's;Compliance with respiratory medication;Exhibits proper breathing techniques, such as pursed lip breathing or other method taught during program session;Maintenance of O2 saturations>88%;Verbalizes importance of monitoring SPO2 with pulse oximeter and return demonstration;Exhibits compliance with exercise, home  and travel O2 prescription    Goals/Expected Outcomes Compliance and understanding of oxygen saturation monitoring and breathing techniques to decrease shortness of breath.             Initial Exercise Prescription:  Initial Exercise Prescription - 05/28/22 1000       Date of Initial Exercise RX and Referring Provider   Date 05/28/22    Referring Provider Mannam    Expected Discharge Date 08/05/22       Treadmill   MPH 2.5    Grade 0    Minutes 15    METs 2.8      Recumbant Elliptical   Level 2    RPM 70    Minutes 15      Prescription Details   Frequency (times per week) 2    Duration Progress to 30 minutes of continuous aerobic without signs/symptoms of physical distress  Intensity   THRR 40-80% of Max Heartrate 58-115    Ratings of Perceived Exertion 11-13    Perceived Dyspnea 0-4      Progression   Progression Continue progressive overload as per policy without signs/symptoms or physical distress.      Resistance Training   Training Prescription Yes    Weight blue bands    Reps 10-15             Perform Capillary Blood Glucose checks as needed.  Exercise Prescription Changes:   Exercise Prescription Changes     Row Name 06/08/22 1200             Response to Exercise   Blood Pressure (Admit) 126/84       Blood Pressure (Exercise) 138/72       Blood Pressure (Exit) 112/58       Heart Rate (Admit) 89 bpm       Heart Rate (Exercise) 112 bpm       Heart Rate (Exit) 85 bpm       Oxygen Saturation (Admit) 94 %       Oxygen Saturation (Exercise) 89 %       Oxygen Saturation (Exit) 97 %       Rating of Perceived Exertion (Exercise) 11       Perceived Dyspnea (Exercise) 1         Resistance Training   Training Prescription Yes       Weight blue bands       Reps 10-15         Treadmill   MPH 2.4       Grade 0       Minutes 15       METs 2.84         Recumbant Elliptical   Level 2       RPM 70       Minutes 15       METs 2.5                Exercise Comments:   Exercise Comments     Row Name 06/03/22 1219           Exercise Comments Pt completed his first day of exercise. He is very motivated. Exercised on treadmill for 15 min, METs 2.45. Then exercised for 15 min on recumbent elliptical, level 2, METs 2.6, which pt enjoyed. Performed warm up and coold down well, no assist needed. Performed squats without difficulty. Discussed  METs with good understanding. Will progress as tolerated.                Exercise Goals and Review:   Exercise Goals     Row Name 05/28/22 0931 06/03/22 1548           Exercise Goals   Increase Physical Activity Yes Yes      Intervention Provide advice, education, support and counseling about physical activity/exercise needs.;Develop an individualized exercise prescription for aerobic and resistive training based on initial evaluation findings, risk stratification, comorbidities and participant's personal goals. Provide advice, education, support and counseling about physical activity/exercise needs.;Develop an individualized exercise prescription for aerobic and resistive training based on initial evaluation findings, risk stratification, comorbidities and participant's personal goals.      Expected Outcomes Short Term: Attend rehab on a regular basis to increase amount of physical activity.;Long Term: Exercising regularly at least 3-5 days a week.;Long Term: Add in home exercise to make exercise part of routine and to  increase amount of physical activity. Short Term: Attend rehab on a regular basis to increase amount of physical activity.;Long Term: Exercising regularly at least 3-5 days a week.;Long Term: Add in home exercise to make exercise part of routine and to increase amount of physical activity.      Increase Strength and Stamina Yes Yes      Intervention Provide advice, education, support and counseling about physical activity/exercise needs.;Develop an individualized exercise prescription for aerobic and resistive training based on initial evaluation findings, risk stratification, comorbidities and participant's personal goals. Provide advice, education, support and counseling about physical activity/exercise needs.;Develop an individualized exercise prescription for aerobic and resistive training based on initial evaluation findings, risk stratification, comorbidities and  participant's personal goals.      Expected Outcomes Short Term: Increase workloads from initial exercise prescription for resistance, speed, and METs.;Short Term: Perform resistance training exercises routinely during rehab and add in resistance training at home;Long Term: Improve cardiorespiratory fitness, muscular endurance and strength as measured by increased METs and functional capacity (6MWT) Short Term: Increase workloads from initial exercise prescription for resistance, speed, and METs.;Short Term: Perform resistance training exercises routinely during rehab and add in resistance training at home;Long Term: Improve cardiorespiratory fitness, muscular endurance and strength as measured by increased METs and functional capacity (6MWT)      Able to understand and use rate of perceived exertion (RPE) scale Yes Yes      Intervention Provide education and explanation on how to use RPE scale Provide education and explanation on how to use RPE scale      Expected Outcomes Short Term: Able to use RPE daily in rehab to express subjective intensity level;Long Term:  Able to use RPE to guide intensity level when exercising independently Short Term: Able to use RPE daily in rehab to express subjective intensity level;Long Term:  Able to use RPE to guide intensity level when exercising independently      Able to understand and use Dyspnea scale Yes Yes      Intervention Provide education and explanation on how to use Dyspnea scale Provide education and explanation on how to use Dyspnea scale      Expected Outcomes Short Term: Able to use Dyspnea scale daily in rehab to express subjective sense of shortness of breath during exertion;Long Term: Able to use Dyspnea scale to guide intensity level when exercising independently Short Term: Able to use Dyspnea scale daily in rehab to express subjective sense of shortness of breath during exertion;Long Term: Able to use Dyspnea scale to guide intensity level when  exercising independently      Knowledge and understanding of Target Heart Rate Range (THRR) Yes Yes      Intervention Provide education and explanation of THRR including how the numbers were predicted and where they are located for reference Provide education and explanation of THRR including how the numbers were predicted and where they are located for reference      Expected Outcomes Short Term: Able to state/look up THRR;Long Term: Able to use THRR to govern intensity when exercising independently;Short Term: Able to use daily as guideline for intensity in rehab Short Term: Able to state/look up THRR;Long Term: Able to use THRR to govern intensity when exercising independently;Short Term: Able to use daily as guideline for intensity in rehab      Understanding of Exercise Prescription Yes Yes      Intervention Provide education, explanation, and written materials on patient's individual exercise prescription Provide education, explanation, and written  materials on patient's individual exercise prescription      Expected Outcomes Short Term: Able to explain program exercise prescription;Long Term: Able to explain home exercise prescription to exercise independently Short Term: Able to explain program exercise prescription;Long Term: Able to explain home exercise prescription to exercise independently               Exercise Goals Re-Evaluation :  Exercise Goals Re-Evaluation     [place] Name 06/03/22 1548             Exercise Goal Re-Evaluation   Exercise Goals Review Increase Physical Activity;Able to understand and use Dyspnea scale;Understanding of Exercise Prescription;Increase Strength and Stamina;Knowledge and understanding of Target Heart Rate Range (THRR);Able to understand and use rate of perceived exertion (RPE) scale       Comments Russell Brown has completed 1 exercise session. He exercises for 15 min on the treadmill and recumbent elliptical. Don averaged 2.45 METs at 2.0 mph and 0% incline  on the treadmill and 2.6 METs at level 2 on the recumbent elliptical. Don performs the warmup and cooldown standing without any limitations. It is too soon to note any discernable progressions. Will continue to monitor and progress as able.       Expected Outcomes Through exercise at rehab and home, the pt will decrease shortness of breath with daily activities and feel confident in carrying out an exercise regimen at home.                Discharge Exercise Prescription (Final Exercise Prescription Changes):  Exercise Prescription Changes - 06/08/22 1200       Response to Exercise   Blood Pressure (Admit) 126/84    Blood Pressure (Exercise) 138/72    Blood Pressure (Exit) 112/58    Heart Rate (Admit) 89 bpm    Heart Rate (Exercise) 112 bpm    Heart Rate (Exit) 85 bpm    Oxygen Saturation (Admit) 94 %    Oxygen Saturation (Exercise) 89 %    Oxygen Saturation (Exit) 97 %    Rating of Perceived Exertion (Exercise) 11    Perceived Dyspnea (Exercise) 1      Resistance Training   Training Prescription Yes    Weight blue bands    Reps 10-15      Treadmill   MPH 2.4    Grade 0    Minutes 15    METs 2.84      Recumbant Elliptical   Level 2    RPM 70    Minutes 15    METs 2.5             Nutrition:  Target Goals: Understanding of nutrition guidelines, daily intake of sodium <152m, cholesterol <2011m calories 30% from fat and 7% or less from saturated fats, daily to have 5 or more servings of fruits and vegetables.  Biometrics:  Pre Biometrics - 05/28/22 1005       Pre Biometrics   Grip Strength 44 kg              Nutrition Therapy Plan and Nutrition Goals:  Nutrition Therapy & Goals - 06/03/22 1421       Nutrition Therapy   Diet Heart Healthy Diet    Drug/Food Interactions Statins/Certain Fruits      Personal Nutrition Goals   Nutrition Goal Patient to identify strateiges for weight gain/weight maintenance of 0.5-2.0# per week    Personal Goal #2  Patient to continue to improve diet quality by using the plate method  as a guide for meal planning to include lean protein/plant protein, fruit, vegetables, whole grains, and dairy as part of balanced diet    Comments Russell Brown reports stopping esbriet in May 2023 due to side effects and weight loss of >20#. He was started on Ofev and stopped it about 2 weeks ago due to side effects. He is receptive to weight gain up to 180#; he is currently drinking Equate brand Ensure (13g protein, 350calories). He does report GERD and remote history of fundeplication and takes PPI twice daily. He reports eating three meals daily and eating a heart healthy diet. He reports improved appetite. His wife and daughter are supportive of lifestyle changes.      Intervention Plan   Intervention Prescribe, educate and counsel regarding individualized specific dietary modifications aiming towards targeted core components such as weight, hypertension, lipid management, diabetes, heart failure and other comorbidities.;Nutrition handout(s) given to patient.    Expected Outcomes Short Term Goal: Understand basic principles of dietary content, such as calories, fat, sodium, cholesterol and nutrients.;Long Term Goal: Adherence to prescribed nutrition plan.             Nutrition Assessments:  Nutrition Assessments - 06/03/22 1607       Rate Your Plate Scores   Pre Score 77            MEDIFICTS Score Key: ?70 Need to make dietary changes  40-70 Heart Healthy Diet ? 40 Therapeutic Level Cholesterol Diet  Flowsheet Row PULMONARY REHAB OTHER RESPIRATORY from 06/03/2022 in [facility] for Heart, Vascular, & Lung Health  Picture Your Plate Total Score on Admission 77      Picture Your Plate Scores: <49 Unhealthy dietary pattern with much room for improvement. 41-50 Dietary pattern unlikely to meet recommendations for good health and room for improvement. 51-60 More healthful dietary pattern, with  some room for improvement.  >60 Healthy dietary pattern, although there may be some specific behaviors that could be improved.    Nutrition Goals Re-Evaluation:  Nutrition Goals Re-Evaluation     [place] Name 06/03/22 1421             Goals   Current Weight 178 lb 9.2 oz (81 kg)       Comment Triglycerids 154       Expected Outcome Don reports stopping esbriet in May 2023 due to side effects and weight loss of >20#. He was started on Ofev and stopped it about 2 weeks ago due to side effects. He is receptive to weight gain up to 180#; he is currently drinking Equate brand Ensure (13g protein, 350calories). He does report GERD and remote history of fundeplication and takes PPI twice daily. He reports eating three meals daily and eating a heart healthy diet. He reports improved appetite. His wife and daughter are supportive of lifestyle changes.                Nutrition Goals Discharge (Final Nutrition Goals Re-Evaluation):  Nutrition Goals Re-Evaluation - 06/03/22 1421       Goals   Current Weight 178 lb 9.2 oz (81 kg)    Comment Triglycerids 154    Expected Outcome Don reports stopping esbriet in May 2023 due to side effects and weight loss of >20#. He was started on Ofev and stopped it about 2 weeks ago due to side effects. He is receptive to weight gain up to 180#; he is currently drinking Equate brand Ensure (13g protein, 350calories). He does report GERD  and remote history of fundeplication and takes PPI twice daily. He reports eating three meals daily and eating a heart healthy diet. He reports improved appetite. His wife and daughter are supportive of lifestyle changes.             Psychosocial: Target Goals: Acknowledge presence or absence of significant depression and/or stress, maximize coping skills, provide positive support system. Participant is able to verbalize types and ability to use techniques and skills needed for reducing stress and depression.  Initial Review  & Psychosocial Screening:  Initial Psych Review & Screening - 05/28/22 0917       Initial Review   Current issues with Current Psychotropic Meds    Comments Pt has managed PTSD, on zoloft long term. Pt does not feel he needs any counseling or further treatment.      Family Dynamics   Good Support System? Yes    Comments wife Malachy Mood, daughter      Barriers   Psychosocial barriers to participate in program The patient should benefit from training in stress management and relaxation.;There are no identifiable barriers or psychosocial needs.      Screening Interventions   Interventions Encouraged to exercise    Expected Outcomes Short Term goal: Utilizing psychosocial counselor, staff and physician to assist with identification of specific Stressors or current issues interfering with healing process. Setting desired goal for each stressor or current issue identified.;Long Term Goal: Stressors or current issues are controlled or eliminated.;Short Term goal: Identification and review with participant of any Quality of Life or Depression concerns found by scoring the questionnaire.;Long Term goal: The participant improves quality of Life and PHQ9 Scores as seen by post scores and/or verbalization of changes             Quality of Life Scores:  Scores of 19 and below usually indicate a poorer quality of life in these areas.  A difference of  2-3 points is a clinically meaningful difference.  A difference of 2-3 points in the total score of the Quality of Life Index has been associated with significant improvement in overall quality of life, self-image, physical symptoms, and general health in studies assessing change in quality of life.  PHQ-9: Review Flowsheet  More data exists      05/28/2022 04/28/2021 04/24/2020 03/17/2020 01/12/2018  Depression screen PHQ 2/9  Decreased Interest 0 0 0 0 0 0 0  Down, Depressed, Hopeless 0 0 0 0 0 0 0  PHQ - 2 Score 0 0 0 0 0 0 0  Altered sleeping 1 - - -  0  Tired, decreased energy 1 - - - 1  Change in appetite 1 - - - 0  Feeling bad or failure about yourself  0 - - - 0  Trouble concentrating 1 - - - 0  Moving slowly or fidgety/restless 0 - - - 1  Suicidal thoughts 0 - - - 0  PHQ-9 Score 4 - - - 2  Difficult doing work/chores Not difficult at all - - - Not difficult at all   Interpretation of Total Score  Total Score Depression Severity:  1-4 = Minimal depression, 5-9 = Mild depression, 10-14 = Moderate depression, 15-19 = Moderately severe depression, 20-27 = Severe depression   Psychosocial Evaluation and Intervention:  Psychosocial Evaluation - 05/28/22 0919       Psychosocial Evaluation & Interventions   Interventions Encouraged to exercise with the program and follow exercise prescription;Relaxation education;Stress management education    Comments Pt psychosocially  is well managed and has good support through family, church, hobbies, exercise. We will support in education and exercise.    Expected Outcomes For pt to participate in [place]  No Follow up required             Psychosocial Re-Evaluation:  Psychosocial Re-Evaluation     [place] Name 06/09/22 1225             Psychosocial Re-Evaluation   Current issues with History of Depression;Current Psychotropic Meds       Comments Russell Brown stated that his mental health has been stable for the last 15 years. He said that taking his medication has helped with his past PTSD and depression diagnosis after retiring from the [employer] force. Russell Brown stated he has good family support through his wife and daughter and denies any psychosocial needs or referrals at this time.       Expected Outcomes Russell Brown will continue to attend PR without any psychosocial barriers or concerns.       Interventions Encouraged to attend Pulmonary Rehabilitation for the exercise       Continue Psychosocial Services  No Follow up required                Psychosocial Discharge  (Final Psychosocial Re-Evaluation):  Psychosocial Re-Evaluation - 06/09/22 1225       Psychosocial Re-Evaluation   Current issues with History of Depression;Current Psychotropic Meds    Comments Russell Brown stated that his mental health has been stable for the last 15 years. He said that taking his medication has helped with his past PTSD and depression diagnosis after retiring from the [employer] force. Russell Brown stated he has good family support through his wife and daughter and denies any psychosocial needs or referrals at this time.    Expected Outcomes Russell Brown will continue to attend PR without any psychosocial barriers or concerns.    Interventions Encouraged to attend Pulmonary Rehabilitation for the exercise    Continue Psychosocial Services  No Follow up required             Education: Education Goals: Education classes will be provided on a weekly basis, covering required topics. Participant will state understanding/return demonstration of topics presented.  Learning Barriers/Preferences:  Learning Barriers/Preferences - 05/28/22 0920       Learning Barriers/Preferences   Learning Barriers None    Learning Preferences Written Material             Education Topics: Introduction to Pulmonary Rehab Group instruction provided by PowerPoint, verbal discussion, and written material to support subject matter. Instructor reviews what Pulmonary Rehab is, the purpose of the program, and how patients are referred.     Know Your Numbers Group instruction that is supported by a PowerPoint presentation. Instructor discusses importance of knowing and understanding resting, exercise, and post-exercise oxygen saturation, heart rate, and blood pressure. Oxygen saturation, heart rate, blood pressure, rating of perceived exertion, and dyspnea are reviewed along with a normal range for these values.    Exercise for the Pulmonary Patient Group instruction that is supported by a PowerPoint presentation.  Instructor discusses benefits of exercise, core components of exercise, frequency, duration, and intensity of an exercise routine, importance of utilizing pulse oximetry during exercise, safety while exercising, and options of places to exercise outside of rehab.       MET Level  Group instruction provided by PowerPoint, verbal discussion, and written material to support subject matter. Instructor reviews what METs are and  how to increase METs.    Pulmonary Medications Verbally interactive group education provided by instructor with focus on inhaled medications and proper administration.   Anatomy and Physiology of the Respiratory System Group instruction provided by PowerPoint, verbal discussion, and written material to support subject matter. Instructor reviews respiratory cycle and anatomical components of the respiratory system and their functions. Instructor also reviews differences in obstructive and restrictive respiratory diseases with examples of each.    Oxygen Safety Group instruction provided by PowerPoint, verbal discussion, and written material to support subject matter. There is an overview of "What is Oxygen" and "Why do we need it".  Instructor also reviews how to create a safe environment for oxygen use, the importance of using oxygen as prescribed, and the risks of noncompliance. There is a brief discussion on traveling with oxygen and resources the patient may utilize.   Oxygen Use Group instruction provided by PowerPoint, verbal discussion, and written material to discuss how supplemental oxygen is prescribed and different types of oxygen supply systems. Resources for more information are provided.    Breathing Techniques Group instruction that is supported by demonstration and informational handouts. Instructor discusses the benefits of pursed lip and diaphragmatic breathing and detailed demonstration on how to perform both.     Risk Factor Reduction Group  instruction that is supported by a PowerPoint presentation. Instructor discusses the definition of a risk factor, different risk factors for pulmonary disease, and how the heart and lungs work together. Flowsheet Row PULMONARY REHAB OTHER RESPIRATORY from 06/03/2022 in [facility] [place]  Date 06/03/22  Educator EP  Instruction Review Code 1- Verbalizes Understanding       MD Day A group question and answer session with a medical doctor that allows participants to ask questions that relate to their pulmonary disease state.   Nutrition for the Pulmonary Patient Group instruction provided by PowerPoint slides, verbal discussion, and written materials to support subject matter. The instructor gives an explanation and review of healthy diet recommendations, which includes a discussion on weight management, recommendations for fruit and vegetable consumption, as well as protein, fluid, caffeine, fiber, sodium, sugar, and alcohol. Tips for eating when patients are short of breath are discussed.    Other Education Group or individual verbal, written, or video instructions that support the educational goals of the pulmonary rehab program.    Knowledge Questionnaire Score:  Knowledge Questionnaire Score - 05/28/22 0920       Knowledge Questionnaire Score   Pre Score 14/18             Core Components/Risk Factors/Patient Goals at Admission:  Personal Goals and Risk Factors at Admission - 05/28/22 0921       Core Components/Risk Factors/Patient Goals on Admission   Improve shortness of breath with ADL's Yes    Intervention Provide education, individualized exercise plan and daily activity instruction to help decrease symptoms of SOB with activities of daily living.    Expected Outcomes Short Term: Improve cardiorespiratory fitness to achieve a reduction of symptoms when performing ADLs;Long Term: Be able to perform more ADLs without  symptoms or delay the onset of symptoms             Core Components/Risk Factors/Patient Goals Review:   Goals and Risk Factor Review     Row Name 06/09/22 1231             Core Components/Risk Factors/Patient Goals Review   Personal Goals Review Improve  shortness of breath with ADL's;Develop more efficient breathing techniques such as purse lipped breathing and diaphragmatic breathing and practicing self-pacing with activity.       Review Russell Brown has attended 2 sessions so far. He has exercised on the treadmill at level 1.9-2.4 and has maintained his oxygen saturations in the low 90s. Russell Brown has attended the risk factor reduction education class and has practiced his pursed lip breathing while exercising.       Expected Outcomes See admission goals                Core Components/Risk Factors/Patient Goals at Discharge (Final Review):   Goals and Risk Factor Review - 06/09/22 1231       Core Components/Risk Factors/Patient Goals Review   Personal Goals Review Improve shortness of breath with ADL's;Develop more efficient breathing techniques such as purse lipped breathing and diaphragmatic breathing and practicing self-pacing with activity.    Review Russell Brown has attended 2 sessions so far. He has exercised on the treadmill at level 1.9-2.4 and has maintained his oxygen saturations in the low 90s. Russell Brown has attended the risk factor reduction education class and has practiced his pursed lip breathing while exercising.    Expected Outcomes See admission goals             ITP Comments: Pt is making expected progress toward Pulmonary Rehab goals after completing 2 sessions. Recommend continued exercise, life style modification, education, and utilization of breathing techniques to increase stamina and strength, while also decreasing shortness of breath with exertion.     Comments: Dr. Rodman Pickle is Medical Director for Pulmonary Rehab at [facility].

## 2022-06-10 ENCOUNTER — Encounter (HOSPITAL_COMMUNITY)
Admission: RE | Admit: 2022-06-10 | Discharge: 2022-06-10 | Disposition: A | Discharge status: Home / Self Care | Payer: Medicare Other | Source: Ambulatory Visit | Attending: Pulmonary Disease | Admitting: Pulmonary Disease

## 2022-06-10 DIAGNOSIS — J849 Interstitial pulmonary disease, unspecified: Secondary | ICD-10-CM | POA: Diagnosis not present

## 2022-06-10 NOTE — Progress Notes (Signed)
Daily Session Note  Patient Details  Name: Russell Brown MRN: [id number] Date of Birth: September 19, 1945 Referring Provider:   April Manson Pulmonary Rehab Walk Test from [facility]/28/2022 in [facility]  Referring Provider [place]       Encounter Date: 06/10/2022  Check In:  Session Check In - 06/10/22 1220       Check-In   Supervising physician immediately available to respond to emergencies [facility] - Physician supervision    Physician(s) Dr. Gala Murdoch    Location [facility]    Staff Present Bridgette Habermann, MS, ACSM-CEP, Exercise Physiologist;Lisa Ysidro Evert, RN    Virtual Visit No    Medication changes reported     No    Fall or balance concerns reported    No    Tobacco Cessation No Change    Warm-up and Cool-down Performed as group-led instruction    Resistance Training Performed Yes    VAD Patient? No    PAD/SET Patient? No      Pain Assessment   Currently in Pain? No/denies    Multiple Pain Sites No             Capillary Blood Glucose: No results found for this or any previous visit (from the past 24 hour(s)).    Social History   Tobacco Use  Smoking Status Former   Packs/day: 1.00   Years: 35.00   Total pack years: 35.00   Types: Cigarettes   Quit date: 08/18/1996   Years since quitting: 25.8   Passive exposure: Past  Smokeless Tobacco Never    Goals Met:  Exercise tolerated well No report of concerns or symptoms today Strength training completed today  Goals Unmet:  Not Applicable  Comments: Service time is from 1028 to [place]    Dr. Rodman Pickle is Medical Director for Pulmonary Rehab at [facility].

## 2022-06-15 ENCOUNTER — Encounter (HOSPITAL_COMMUNITY): Payer: Medicare Other

## 2022-06-17 ENCOUNTER — Encounter (HOSPITAL_COMMUNITY)
Admission: RE | Admit: 2022-06-17 | Discharge: 2022-06-17 | Disposition: A | Discharge status: Home / Self Care | Payer: Medicare Other | Source: Ambulatory Visit | Attending: Pulmonary Disease | Admitting: Pulmonary Disease

## 2022-06-17 DIAGNOSIS — J849 Interstitial pulmonary disease, unspecified: Secondary | ICD-10-CM

## 2022-06-17 NOTE — Progress Notes (Signed)
Daily Session Note  Patient Details  Name: Russell Brown MRN: [id number] Date of Birth: Jan 31, 1946 Referring Provider:   April Manson Pulmonary Rehab Walk Test from 05/28/2022 in [facility] [place]  Referring Provider [place]       Encounter Date: 06/17/2022  Check In:  Session Check In - 06/17/22 1202       Check-In   Supervising physician immediately available to respond to emergencies [facility] - Physician supervision    Physician(s) Dr. Erskine Emery    Location [facility]    Staff Present Rodney Langton, RN;Johnny Starleen Blue, MS, Exercise Physiologist;Demario Faniel Yevonne Pax, ACSM-CEP, Exercise Physiologist    Virtual Visit No    Medication changes reported     No    Fall or balance concerns reported    No    Tobacco Cessation No Change    Warm-up and Cool-down Performed as group-led instruction    Resistance Training Performed Yes    VAD Patient? No    PAD/SET Patient? No      Pain Assessment   Currently in Pain? No/denies    Multiple Pain Sites No             Capillary Blood Glucose: No results found for this or any previous visit (from the past 24 hour(s)).    Social History   Tobacco Use  Smoking Status Former   Packs/day: 1.00   Years: 35.00   Total pack years: 35.00   Types: Cigarettes   Quit date: 08/18/1996   Years since quitting: 25.8   Passive exposure: Past  Smokeless Tobacco Never    Goals Met:  Independence with exercise equipment Exercise tolerated well No report of concerns or symptoms today Strength training completed today  Goals Unmet:  Not Applicable  Comments: Service time is from 1015 to 1155.    Dr. Rodman Pickle is Medical Director for Pulmonary Rehab at [facility].

## 2022-06-22 ENCOUNTER — Encounter (HOSPITAL_COMMUNITY): Payer: Medicare Other

## 2022-06-22 ENCOUNTER — Other Ambulatory Visit (HOSPITAL_COMMUNITY): Payer: Self-pay

## 2022-06-22 ENCOUNTER — Telehealth (HOSPITAL_COMMUNITY): Payer: Self-pay

## 2022-06-22 NOTE — Telephone Encounter (Signed)
Pt called and stated he would have to drop from Pulmonary rehab, due to family issues.

## 2022-06-23 ENCOUNTER — Other Ambulatory Visit: Payer: Self-pay | Admitting: Family Medicine

## 2022-06-23 DIAGNOSIS — I10 Essential (primary) hypertension: Secondary | ICD-10-CM

## 2022-06-23 NOTE — Progress Notes (Signed)
Discharge Progress Report  Patient Details  Name: UNNAMED ZEIEN MRN: [id number] Date of Birth: June 04, 1946 Referring Provider:   April Manson Pulmonary Rehab Walk Test from 05/28/2022 in [facility] [place]  Referring Provider Mannam        Number of Visits: 4  Reason for Discharge:  Early Exit:  Personal Don called and ask to be dropped from the PR program due to a family emergency situation.  Smoking History:  Social History   Tobacco Use  Smoking Status Former   Packs/day: 1.00   Years: 35.00   Total pack years: 35.00   Types: Cigarettes   Quit date: 08/18/1996   Years since quitting: 25.8   Passive exposure: Past  Smokeless Tobacco Never    Diagnosis:  ILD (interstitial lung disease) ([place])  ADL [facility]:  Pulmonary Assessment Scores     Row Name 05/28/22 0923         ADL [facility]SD   ADL Phase Entry     SOB Score total 21       CAT Score   CAT Score 21       mMRC Score   mMRC Score 2              Initial Exercise Prescription:  Initial Exercise Prescription - 05/28/22 1000       Date of Initial Exercise RX and Referring Provider   Date 05/28/22    Referring Provider Mannam    Expected Discharge Date 08/05/22      Treadmill   MPH 2.5    Grade 0    Minutes 15    METs 2.8      Recumbant Elliptical   Level 2    RPM 70    Minutes 15      Prescription Details   Frequency (times per week) 2    Duration Progress to 30 minutes of continuous aerobic without signs/symptoms of physical distress      Intensity   THRR 40-80% of Max Heartrate 58-115    Ratings of Perceived Exertion 11-13    Perceived Dyspnea 0-4      Progression   Progression Continue progressive overload as per policy without signs/symptoms or physical distress.      Resistance Training   Training Prescription Yes    Weight blue bands    Reps 10-15             Discharge Exercise Prescription (Final Exercise  Prescription Changes):  Exercise Prescription Changes - 06/08/22 1200       Response to Exercise   Blood Pressure (Admit) 126/84    Blood Pressure (Exercise) 138/72    Blood Pressure (Exit) 112/58    Heart Rate (Admit) 89 bpm    Heart Rate (Exercise) 112 bpm    Heart Rate (Exit) 85 bpm    Oxygen Saturation (Admit) 94 %    Oxygen Saturation (Exercise) 89 %    Oxygen Saturation (Exit) 97 %    Rating of Perceived Exertion (Exercise) 11    Perceived Dyspnea (Exercise) 1      Resistance Training   Training Prescription Yes    Weight blue bands    Reps 10-15      Treadmill   MPH 2.4    Grade 0    Minutes 15    METs 2.84      Recumbant Elliptical   Level 2    RPM 70    Minutes 15  METs 2.5             Functional Capacity:  6 Minute Walk     Row Name 05/28/22 1000         6 Minute Walk   Phase Initial     Distance 1490 feet     Walk Time 6 minutes     # of Rest Breaks 0     MPH 2.82     METS 3.69     RPE 11     Perceived Dyspnea  1     VO2 Peak 12.92     Symptoms No     Resting HR 78 bpm     Resting BP 142/70     Resting Oxygen Saturation  95 %     Exercise Oxygen Saturation  during 6 min walk 90 %     Max Ex. HR 111 bpm     Max Ex. BP 170/62     2 Minute Post BP 150/64       Interval HR   1 Minute HR 88     2 Minute HR 88     3 Minute HR 98     4 Minute HR 102     5 Minute HR 106     6 Minute HR 111     2 Minute Post HR 88     Interval Heart Rate? Yes       Interval Oxygen   Interval Oxygen? Yes     Baseline Oxygen Saturation % 95 %     1 Minute Oxygen Saturation % 94 %     1 Minute Liters of Oxygen 0 L     2 Minute Oxygen Saturation % 93 %     2 Minute Liters of Oxygen 0 L     3 Minute Oxygen Saturation % 90 %     3 Minute Liters of Oxygen 0 L     4 Minute Oxygen Saturation % 91 %     4 Minute Liters of Oxygen 0 L     5 Minute Oxygen Saturation % 91 %     5 Minute Liters of Oxygen 0 L     6 Minute Oxygen Saturation % 91 %     6  Minute Liters of Oxygen 0 L     2 Minute Post Oxygen Saturation % 95 %     2 Minute Post Liters of Oxygen 0 L              Psychological, QOL, Others - Outcomes: PHQ 2/9:    05/28/2022    9:25 AM 05/28/2022    9:17 AM 04/28/2021   10:35 AM 04/28/2021   10:33 AM 04/24/2020    1:39 PM  Depression screen PHQ 2/9  Decreased Interest 0 0 0 0 0  Down, Depressed, Hopeless 0 0 0 0 0  PHQ - 2 Score 0 0 0 0 0  Altered sleeping 1      Tired, decreased energy 1      Change in appetite 1      Feeling bad or failure about yourself  0      Trouble concentrating 1      Moving slowly or fidgety/restless 0      Suicidal thoughts 0      PHQ-9 Score 4      Difficult doing work/chores Not difficult at all        Quality of Life:   Personal Goals:  Goals established at orientation with interventions provided to work toward goal.  Personal Goals and Risk Factors at Admission - 05/28/22 0921       Core Components/Risk Factors/Patient Goals on Admission   Improve shortness of breath with ADL's Yes    Intervention Provide education, individualized exercise plan and daily activity instruction to help decrease symptoms of SOB with activities of daily living.    Expected Outcomes Short Term: Improve cardiorespiratory fitness to achieve a reduction of symptoms when performing ADLs;Long Term: Be able to perform more ADLs without symptoms or delay the onset of symptoms              Personal Goals Discharge:  Goals and Risk Factor Review     Row Name 06/09/22 1231             Core Components/Risk Factors/Patient Goals Review   Personal Goals Review Improve shortness of breath with ADL's;Develop more efficient breathing techniques such as purse lipped breathing and diaphragmatic breathing and practicing self-pacing with activity.       Review Timmothy Sours has attended 2 sessions so far. He has exercised on the treadmill at level 1.9-2.4 and has maintained his oxygen saturations in the low 90s. Timmothy Sours has  attended the risk factor reduction education class and has practiced his pursed lip breathing while exercising.       Expected Outcomes See admission goals                Exercise Goals and Review:  Exercise Goals     Row Name 05/28/22 0931 06/03/22 1548           Exercise Goals   Increase Physical Activity Yes Yes      Intervention Provide advice, education, support and counseling about physical activity/exercise needs.;Develop an individualized exercise prescription for aerobic and resistive training based on initial evaluation findings, risk stratification, comorbidities and participant's personal goals. Provide advice, education, support and counseling about physical activity/exercise needs.;Develop an individualized exercise prescription for aerobic and resistive training based on initial evaluation findings, risk stratification, comorbidities and participant's personal goals.      Expected Outcomes Short Term: Attend rehab on a regular basis to increase amount of physical activity.;Long Term: Exercising regularly at least 3-5 days a week.;Long Term: Add in home exercise to make exercise part of routine and to increase amount of physical activity. Short Term: Attend rehab on a regular basis to increase amount of physical activity.;Long Term: Exercising regularly at least 3-5 days a week.;Long Term: Add in home exercise to make exercise part of routine and to increase amount of physical activity.      Increase Strength and Stamina Yes Yes      Intervention Provide advice, education, support and counseling about physical activity/exercise needs.;Develop an individualized exercise prescription for aerobic and resistive training based on initial evaluation findings, risk stratification, comorbidities and participant's personal goals. Provide advice, education, support and counseling about physical activity/exercise needs.;Develop an individualized exercise prescription for aerobic and  resistive training based on initial evaluation findings, risk stratification, comorbidities and participant's personal goals.      Expected Outcomes Short Term: Increase workloads from initial exercise prescription for resistance, speed, and METs.;Short Term: Perform resistance training exercises routinely during rehab and add in resistance training at home;Long Term: Improve cardiorespiratory fitness, muscular endurance and strength as measured by increased METs and functional capacity (6MWT) Short Term: Increase workloads from initial exercise prescription for resistance, speed, and METs.;Short Term: Perform resistance training exercises routinely during rehab  and add in resistance training at home;Long Term: Improve cardiorespiratory fitness, muscular endurance and strength as measured by increased METs and functional capacity (6MWT)      Able to understand and use rate of perceived exertion (RPE) scale Yes Yes      Intervention Provide education and explanation on how to use RPE scale Provide education and explanation on how to use RPE scale      Expected Outcomes Short Term: Able to use RPE daily in rehab to express subjective intensity level;Long Term:  Able to use RPE to guide intensity level when exercising independently Short Term: Able to use RPE daily in rehab to express subjective intensity level;Long Term:  Able to use RPE to guide intensity level when exercising independently      Able to understand and use Dyspnea scale Yes Yes      Intervention Provide education and explanation on how to use Dyspnea scale Provide education and explanation on how to use Dyspnea scale      Expected Outcomes Short Term: Able to use Dyspnea scale daily in rehab to express subjective sense of shortness of breath during exertion;Long Term: Able to use Dyspnea scale to guide intensity level when exercising independently Short Term: Able to use Dyspnea scale daily in rehab to express subjective sense of shortness of  breath during exertion;Long Term: Able to use Dyspnea scale to guide intensity level when exercising independently      Knowledge and understanding of Target Heart Rate Range (THRR) Yes Yes      Intervention Provide education and explanation of THRR including how the numbers were predicted and where they are located for reference Provide education and explanation of THRR including how the numbers were predicted and where they are located for reference      Expected Outcomes Short Term: Able to state/look up THRR;Long Term: Able to use THRR to govern intensity when exercising independently;Short Term: Able to use daily as guideline for intensity in rehab Short Term: Able to state/look up THRR;Long Term: Able to use THRR to govern intensity when exercising independently;Short Term: Able to use daily as guideline for intensity in rehab      Understanding of Exercise Prescription Yes Yes      Intervention Provide education, explanation, and written materials on patient's individual exercise prescription Provide education, explanation, and written materials on patient's individual exercise prescription      Expected Outcomes Short Term: Able to explain program exercise prescription;Long Term: Able to explain home exercise prescription to exercise independently Short Term: Able to explain program exercise prescription;Long Term: Able to explain home exercise prescription to exercise independently               Exercise Goals Re-Evaluation:  Exercise Goals Re-Evaluation     [place] Name 06/03/22 1548             Exercise Goal Re-Evaluation   Exercise Goals Review Increase Physical Activity;Able to understand and use Dyspnea scale;Understanding of Exercise Prescription;Increase Strength and Stamina;Knowledge and understanding of Target Heart Rate Range (THRR);Able to understand and use rate of perceived exertion (RPE) scale       Comments Timmothy Sours has completed 1 exercise session. He exercises for 15 min on  the treadmill and recumbent elliptical. Don averaged 2.45 METs at 2.0 mph and 0% incline on the treadmill and 2.6 METs at level 2 on the recumbent elliptical. Don performs the warmup and cooldown standing without any limitations. It is too soon to note any discernable progressions. Will  continue to monitor and progress as able.       Expected Outcomes Through exercise at rehab and home, the pt will decrease shortness of breath with daily activities and feel confident in carrying out an exercise regimen at home.                Nutrition & Weight - Outcomes:  Pre Biometrics - 05/28/22 1005       Pre Biometrics   Grip Strength 44 kg              Nutrition:  Nutrition Therapy & Goals - 06/03/22 1421       Nutrition Therapy   Diet Heart Healthy Diet    Drug/Food Interactions Statins/Certain Fruits      Personal Nutrition Goals   Nutrition Goal Patient to identify strateiges for weight gain/weight maintenance of 0.5-2.0# per week    Personal Goal #2 Patient to continue to improve diet quality by using the plate method as a guide for meal planning to include lean protein/plant protein, fruit, vegetables, whole grains, and dairy as part of balanced diet    Comments Timmothy Sours reports stopping esbriet in May 2023 due to side effects and weight loss of >20#. He was started on Ofev and stopped it about 2 weeks ago due to side effects. He is receptive to weight gain up to 180#; he is currently drinking Equate brand Ensure (13g protein, 350calories). He does report GERD and remote history of fundeplication and takes PPI twice daily. He reports eating three meals daily and eating a heart healthy diet. He reports improved appetite. His wife and daughter are supportive of lifestyle changes.      Intervention Plan   Intervention Prescribe, educate and counsel regarding individualized specific dietary modifications aiming towards targeted core components such as weight, hypertension, lipid management,  diabetes, heart failure and other comorbidities.;Nutrition handout(s) given to patient.    Expected Outcomes Short Term Goal: Understand basic principles of dietary content, such as calories, fat, sodium, cholesterol and nutrients.;Long Term Goal: Adherence to prescribed nutrition plan.             Nutrition Discharge:  Nutrition Assessments - 06/03/22 1607       Rate Your Plate Scores   Pre Score 77             Education Questionnaire Score:  Knowledge Questionnaire Score - 05/28/22 0920       Knowledge Questionnaire Score   Pre Score 14/18             Goals reviewed with patient; copy given to patient.

## 2022-06-24 ENCOUNTER — Encounter (HOSPITAL_COMMUNITY): Payer: Medicare Other

## 2022-06-29 ENCOUNTER — Encounter (HOSPITAL_COMMUNITY): Payer: Medicare Other

## 2022-07-01 ENCOUNTER — Ambulatory Visit: Payer: Medicare Other | Admitting: Pulmonary Disease

## 2022-07-06 ENCOUNTER — Encounter (HOSPITAL_COMMUNITY): Payer: Medicare Other

## 2022-07-08 ENCOUNTER — Encounter (HOSPITAL_COMMUNITY): Payer: Medicare Other

## 2022-07-13 ENCOUNTER — Encounter (HOSPITAL_COMMUNITY): Payer: Medicare Other

## 2022-07-14 ENCOUNTER — Ambulatory Visit: Payer: Medicare Other | Admitting: Acute Care

## 2022-07-15 ENCOUNTER — Encounter (HOSPITAL_COMMUNITY): Payer: Medicare Other

## 2022-07-20 ENCOUNTER — Encounter (HOSPITAL_COMMUNITY): Payer: Medicare Other

## 2022-07-22 ENCOUNTER — Encounter (HOSPITAL_COMMUNITY): Payer: Medicare Other

## 2022-07-27 ENCOUNTER — Encounter (HOSPITAL_COMMUNITY): Payer: Medicare Other

## 2022-07-29 ENCOUNTER — Encounter (HOSPITAL_COMMUNITY): Payer: Medicare Other

## 2022-08-03 ENCOUNTER — Ambulatory Visit (INDEPENDENT_AMBULATORY_CARE_PROVIDER_SITE_OTHER): Payer: Medicare Other | Admitting: Psychology

## 2022-08-03 ENCOUNTER — Encounter (HOSPITAL_COMMUNITY): Payer: Medicare Other

## 2022-08-03 DIAGNOSIS — F4323 Adjustment disorder with mixed anxiety and depressed mood: Secondary | ICD-10-CM | POA: Diagnosis not present

## 2022-08-03 NOTE — Progress Notes (Signed)
[facility] Counselor Initial Adult Exam  Name: Russell Brown Date: 08/03/2022 MRN: [id number] DOB: 1946/01/07 PCP: Flossie Buffy, NP  Time spent: 2:00pm-2:55pm   55 minutes  Guardian/Payee:  n/a    Paperwork requested: No   Reason for Visit /Presenting Problem: Pt present for face-to-face initial assessment via video Webex.  Pt consents to telehealth video session due to COVID 19 pandemic. Location of pt: home Location of therapist: home office.  Pt states he is "in a hole he can not get out of."   He feels depressed and anxious.  Pt states he can't eat. Pt has had a difficult year medically.  Pt has 3 lung diseases, two of which are terminal (COPD and pulmonary fibrosis).  Pt feels anxious about his health issues.  Pt has to avoid crowds bc of his pulmonary issues.   Pt is isolated bc of his health conditions.  Pt use to do a lot of volunteer work but can't now and misses it.   Pt states the past two months especially triggered his anxiety.   On December 3rd pt's best friend killed himself.  This was "out of the blue" and tragic for pt.  Pt's nephew Quita Skye was also murdered last month.  His nephew was a Engineer, structural.  Pt is grieving these losses.   Pt's oldest son is bipolar and pt got a 5 page letter from him asking for help.  Pt's wife does not like his son who is from pt's previous marriage.   Pt and wife have conflict over helping pt's son.   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  He saw many things "he could not unsee".   In 2019 pt started to have a lot of anxiety and anger issues.  Pt saw a therapist and was diagnosed with PTSD.   Pt had flashbacks.   Pt was in therapy for a couple of years for treatment of PTSD.  Pt was prescribed Sertraline 48m.   Mental Status Exam: Appearance:   Casual     Behavior:  Appropriate  Motor:  Normal  Speech/Language:   Normal Rate  Affect:  Appropriate  Mood:  normal  Thought process:  normal  Thought content:    WNL   Sensory/Perceptual disturbances:    WNL  Orientation:  oriented to person, place, time/date, and situation  Attention:  Good  Concentration:  Good  Memory:  WNL  Fund of knowledge:   Good  Insight:    Good  Judgment:   Good  Impulse Control:  Good    Reported Symptoms:  anxiety, depression  Risk Assessment: Danger to Self:  No Self-injurious Behavior: No Danger to Others: No Duty to Warn:no Physical Aggression / Violence:No  Access to Firearms a concern: No  Gang Involvement:No  Patient / guardian was educated about steps to take if suicide or homicide risk level increases between visits: n/a While future psychiatric events cannot be accurately predicted, the patient does not currently require acute inpatient psychiatric care and does not currently meet [facility] criteria.  Substance Abuse History: Current substance abuse: No     Past Psychiatric History:   Previous psychological history is significant for anxiety and depression Outpatient Providers:pt has been in therapy in the past History of Psych Hospitalization: No  Psychological Testing:  n/a    Abuse History:  Victim of: No.,  n/a    Report needed: No. Victim of Neglect:No. Perpetrator of  n/a   Witness /  Exposure to Domestic Violence: No   Protective Services Involvement: No  Witness to [employer] Violence:  Yes   Family History:  Family History  Problem Relation Age of Onset   Parkinsonism Mother    Osteoporosis Mother    Pneumonia Mother        83   Cancer Father        prostate,  skin   Hyperlipidemia Father    Hypertension Father    Prostate cancer Father    Asthma Son    Osteoporosis Paternal Aunt    Alzheimer's disease Paternal Uncle    Osteoporosis Maternal Grandmother    Cancer Maternal Grandmother        lung   Heart disease Paternal Grandmother        MI   Heart disease Paternal Grandfather        MI   Colon cancer Neg Hx    Pancreatic cancer Neg Hx     Stomach cancer Neg Hx    Esophageal cancer Neg Hx    Liver cancer Neg Hx    Rectal cancer Neg Hx     Living situation: the patient lives with his wife.  Pt grew up with both parents and 3 brothers.  Pt is the oldest.  Pt states he had a good childhood.  Family history of mental health issues:   pt's mother had anxiety.    Sexual Orientation: Straight  Relationship Status: married for 33 years.   This is pt's 2nd marriage.  Name of spouse / other:Cheryl If a parent, number of children / ages:3 adult children and 1 adult step daughter.  Pt has a 70 yo grandson.    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   Military Service: No   Educational History: Education: Scientist, product/process development: Protestant  Any cultural differences that may affect / interfere with treatment:  not applicable   Recreation/Hobbies: gardening, legos, video games, home maintenance.   Stressors: Health problems   Loss of best friend and nephew.     Strengths: Supportive Relationships, Spirituality, Hopefulness, Self Advocate, and Able to Communicate Effectively  Barriers:  none   Legal History: Pending legal issue / charges: The patient has no significant history of legal issues. 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   COPD (chronic obstructive pulmonary disease) (HCC)    GERD (gastroesophageal reflux disease)    History of hiatal hernia    Hyperlipemia    Hypertension    Hypothyroidism    Pneumonia 2013; 2017   PTSD (post-traumatic stress disorder)    retired Engineer, structural    Pulmonary fibrosis ([place])    Sleep apnea    mild - but  does not wear a c-pap per pt   Thyroid disease    thyroid nodules - half of thyroid was removed per pt    Past Surgical History:  Procedure Laterality Date   APPENDECTOMY     BACK SURGERY     CARDIAC CATHETERIZATION  1980s   "no blockage at that time" (02/20/2018)   CERVICAL SPINE SURGERY     "enlarged hole that nerves went thru"   FINGER AMPUTATION  Left 2004   2nd and 3rd digits; "table saw injury"   FOOT FRACTURE SURGERY Left    "heel OR; put 2 steel rods in and took them out 6 wks later"   [place] CATH AND CORONARY ANGIOGRAPHY N/A 02/21/2018   Procedure: LEFT HEART CATH AND CORONARY ANGIOGRAPHY;  Surgeon: Troy Sine, MD;  Location: [facility];  Service: Cardiovascular;  Laterality: N/A;   NASAL SEPTUM SURGERY  [id number]   NISSEN FUNDOPLICATION  [id number]   "w/hernia repair"   THYROIDECTOMY, PARTIAL  2000s   WISDOM TOOTH EXTRACTION      Medications: Current Outpatient Medications  Medication Sig Dispense Refill   ANORO ELLIPTA 62.5-25 MCG/ACT AEPB USE 1 INHALATION BY MOUTH  DAILY 180 each 3   aspirin EC 81 MG tablet Take 81 mg by mouth daily.     atorvastatin (LIPITOR) 20 MG tablet Take 1 tablet (20 mg total) by mouth daily. 90 tablet 3   calcium citrate (CALCITRATE - DOSED IN MG ELEMENTAL CALCIUM) 950 (200 Ca) MG tablet Take 600 mg of elemental calcium by mouth daily.     cholecalciferol (VITAMIN D3) 25 MCG (1000 UNIT) tablet Take 800 Units by mouth daily.     dutasteride (AVODART) 0.5 MG capsule Take 1 capsule (0.5 mg total) by mouth daily. 90 capsule 3   fish oil-omega-3 fatty acids 1000 MG capsule Take 2 g by mouth daily.      levothyroxine (SYNTHROID) 75 MCG tablet TAKE 1 TABLET BY MOUTH DAILY  BEFORE BREAKFAST 90 tablet 0   lisinopril-hydrochlorothiazide (ZESTORETIC) 10-12.5 MG tablet TAKE 1 TABLET BY MOUTH DAILY 90 tablet 3   Multiple Vitamin (MULTIVITAMIN WITH MINERALS) TABS tablet Take 1 tablet by mouth daily.      Nintedanib (OFEV) 150 MG CAPS Take 1 capsule (150 mg total) by mouth 2 (two) times daily. (Patient not taking: Reported on 05/28/2022) 180 capsule 1   nitroGLYCERIN (NITROSTAT) 0.4 MG SL tablet PLACE 1 TABLET UNDER THE TONGUE EVERY 5 MINUTES AS NEEDED 25 tablet 3   omeprazole (PRILOSEC) 40 MG capsule Take 1 capsule (40 mg total) by mouth 2 (two) times daily. 180 capsule 3   sertraline (ZOLOFT) 50 MG tablet Take 1.5 tablets (75 mg total) by mouth daily. 135 tablet 3   No current facility-administered medications for this visit.    Allergies  Allergen Reactions   Codeine Anxiety    Reaction if taken for extended periods of time.    Diagnoses:  F43.23  Plan of Care: Recommend ongoing therapy.  Pt participated in setting treatment goals.  He is having trouble dealing with his best friend's suicide and his nephew's murder.   Pt wants help dealing with his son bc of the conflict with his wife over helping his son out financially.  Pt wants to improve coping skills.  Plan to meet every one to two weeks.    Treatment Plan (target date 08/04/2023) Client Abilities/Strengths  Pt is bright, engaging, and motivated for therapy.  Client Treatment Preferences  Individual therapy.  Client Statement of Needs  Improve copings skills and understand herself better. Improve self esteem.  Symptoms  Depressed or irritable mood. Excessive and/or unrealistic worry that is difficult to control occurring more days than not for at least 6 months about a number of events or activities. Hypervigilance (e.g., feeling constantly on edge, experiencing concentration difficulties, having trouble falling or staying asleep, exhibiting a general state  of irritability). Low self-esteem. Problems Addressed  Unipolar Depression, Anxiety Goals 1. Alleviate depressive symptoms and return to previous level of effective functioning. 2. Appropriately grieve the loss in order to normalize mood and to return to previously adaptive  level of functioning. Objective Learn and implement behavioral strategies to overcome depression. Target Date: 2023-08-04 Frequency: Biweekly  Progress: 10 Modality: individual  Related Interventions Assist the client in developing skills that increase the likelihood of deriving pleasure from behavioral activation (e.g., assertiveness skills, developing an exercise plan, less internal/more external focus, increased social involvement); reinforce success. Engage the client in "behavioral activation," increasing his/her activity level and contact with sources of reward, while identifying processes that inhibit activation. use behavioral techniques such as instruction, rehearsal, role-playing, role reversal, as needed, to facilitate activity in the client's daily life; reinforce success. 3. Develop healthy interpersonal relationships that lead to the alleviation and help prevent the relapse of depression. 4. Develop healthy thinking patterns and beliefs about self, others, and the world that lead to the alleviation and help prevent the relapse of depression. 5. Enhance ability to effectively cope with the full variety of life's worries and anxieties. 6. Learn and implement coping skills that result in a reduction of anxiety and worry, and improved daily functioning. Objective Learn and implement problem-solving strategies for realistically addressing worries. Target Date: 2023-08-04 Frequency: Biweekly  Progress: 10 Modality: individual  Related Interventions Assign the client a homework exercise in which he/she problem-solves a current problem (see Mastery of Your Anxiety and Worry: Workbook by Adora Fridge and Eliot Ford or Generalized Anxiety Disorder by Eather Colas, and Eliot Ford); review, reinforce success, and provide corrective feedback toward improvement. Teach the client problem-solving strategies involving specifically defining a problem, generating options for addressing it, evaluating the pros and  cons of each option, selecting and implementing an optional action, and reevaluating and refining the action. Objective Learn and implement calming skills to reduce overall anxiety and manage anxiety symptoms. Target Date: 2023-08-04 Frequency: Biweekly  Progress: 10 Modality: individual  Related Interventions Assign the client to read about progressive muscle relaxation and other calming strategies in relevant books or treatment manuals (e.g., Progressive Relaxation Training by Leroy Kennedy; Mastery of Your Anxiety and Worry: Workbook by Beckie Busing). Assign the client homework each session in which he/she practices relaxation exercises daily, gradually applying them progressively from non-anxiety-provoking to anxiety-provoking situations; review and reinforce success while providing corrective feedback toward improvement. Teach the client calming/relaxation skills (e.g., applied relaxation, progressive muscle relaxation, cue controlled relaxation; mindful breathing; biofeedback) and how to discriminate better between relaxation and tension; teach the client how to apply these skills to his/her daily life. 7. Recognize, accept, and cope with feelings of depression. 8. Reduce overall frequency, intensity, and duration of the anxiety so that daily functioning is not impaired. 9. Resolve the core conflict that is the source of anxiety. 10. Stabilize anxiety level while increasing ability to function on a daily basis. Diagnosis F43.23 Conditions For Discharge Achievement of treatment goals and objectives    Clint Bolder, LCSW

## 2022-08-05 ENCOUNTER — Encounter: Payer: Self-pay | Admitting: Nurse Practitioner

## 2022-08-05 ENCOUNTER — Ambulatory Visit (HOSPITAL_BASED_OUTPATIENT_CLINIC_OR_DEPARTMENT_OTHER)
Admission: RE | Admit: 2022-08-05 | Discharge: 2022-08-05 | Disposition: A | Discharge status: Home / Self Care | Payer: Medicare Other | Source: Ambulatory Visit | Attending: Nurse Practitioner | Admitting: Nurse Practitioner

## 2022-08-05 ENCOUNTER — Ambulatory Visit (INDEPENDENT_AMBULATORY_CARE_PROVIDER_SITE_OTHER): Payer: Medicare Other | Admitting: Nurse Practitioner

## 2022-08-05 ENCOUNTER — Encounter (HOSPITAL_COMMUNITY): Payer: Medicare Other

## 2022-08-05 VITALS — BP 114/70 | HR 77 | Temp 98.3°F | Resp 16 | Ht 73.0 in | Wt 172.0 lb

## 2022-08-05 DIAGNOSIS — R5383 Other fatigue: Secondary | ICD-10-CM | POA: Insufficient documentation

## 2022-08-05 DIAGNOSIS — F411 Generalized anxiety disorder: Secondary | ICD-10-CM

## 2022-08-05 DIAGNOSIS — R5381 Other malaise: Secondary | ICD-10-CM | POA: Diagnosis present

## 2022-08-05 DIAGNOSIS — J849 Interstitial pulmonary disease, unspecified: Secondary | ICD-10-CM | POA: Diagnosis not present

## 2022-08-05 DIAGNOSIS — F4310 Post-traumatic stress disorder, unspecified: Secondary | ICD-10-CM | POA: Diagnosis not present

## 2022-08-05 DIAGNOSIS — J439 Emphysema, unspecified: Secondary | ICD-10-CM | POA: Insufficient documentation

## 2022-08-05 DIAGNOSIS — J438 Other emphysema: Secondary | ICD-10-CM | POA: Diagnosis present

## 2022-08-05 DIAGNOSIS — I739 Peripheral vascular disease, unspecified: Secondary | ICD-10-CM

## 2022-08-05 DIAGNOSIS — E039 Hypothyroidism, unspecified: Secondary | ICD-10-CM | POA: Diagnosis not present

## 2022-08-05 DIAGNOSIS — R112 Nausea with vomiting, unspecified: Secondary | ICD-10-CM

## 2022-08-05 LAB — COMPREHENSIVE METABOLIC PANEL
ALT: 19 U/L (ref 0–53)
AST: 14 U/L (ref 0–37)
Albumin: 4.3 g/dL (ref 3.5–5.2)
Alkaline Phosphatase: 63 U/L (ref 39–117)
BUN: 14 mg/dL (ref 6–23)
CO2: 26 mEq/L (ref 19–32)
Calcium: 9.3 mg/dL (ref 8.4–10.5)
Chloride: 100 mEq/L (ref 96–112)
Creatinine, Ser: 1 mg/dL (ref 0.40–1.50)
GFR: 73.03 mL/min (ref >60.00)
Glucose, Bld: 92 mg/dL (ref 70–99)
Potassium: 3.9 mEq/L (ref 3.5–5.1)
Sodium: 138 mEq/L (ref 135–145)
Total Bilirubin: 1 mg/dL (ref 0.2–1.2)
Total Protein: 6.5 g/dL (ref 6.0–8.3)

## 2022-08-05 LAB — CBC WITH DIFFERENTIAL/PLATELET
Basophils Absolute: 0 10*3/uL (ref 0.0–0.1)
Basophils Relative: 0.5 % (ref 0.0–3.0)
Eosinophils Absolute: 0.2 10*3/uL (ref 0.0–0.7)
Eosinophils Relative: 3 % (ref 0.0–5.0)
HCT: 47.1 % (ref 39.0–52.0)
Hemoglobin: 16.1 g/dL (ref 13.0–17.0)
Lymphocytes Relative: 13.4 % (ref 12.0–46.0)
Lymphs Abs: 1 10*3/uL (ref 0.7–4.0)
MCHC: 34.2 g/dL (ref 30.0–36.0)
MCV: 90.3 fl (ref 78.0–100.0)
Monocytes Absolute: 0.6 10*3/uL (ref 0.1–1.0)
Monocytes Relative: 8.6 % (ref 3.0–12.0)
Neutro Abs: 5.5 10*3/uL (ref 1.4–7.7)
Neutrophils Relative %: 74.5 % (ref 43.0–77.0)
Platelets: 210 10*3/uL (ref 150.0–400.0)
RBC: 5.22 Mil/uL (ref 4.22–5.81)
RDW: 13.7 % (ref 11.5–15.5)
WBC: 7.4 10*3/uL (ref 4.0–10.5)

## 2022-08-05 LAB — TSH: TSH: 1.97 u[IU]/mL (ref 0.35–5.50)

## 2022-08-05 LAB — BRAIN NATRIURETIC PEPTIDE: Pro B Natriuretic peptide (BNP): 35 pg/mL (ref 0.0–100.0)

## 2022-08-05 LAB — T4, FREE: Free T4: 1.36 ng/dL (ref 0.60–1.60)

## 2022-08-05 MED ORDER — ONDANSETRON HCL 4 MG PO TABS: 4.0000 mg | ORAL_TABLET | Freq: Three times a day (TID) | ORAL | 0 refills | Status: DC | PRN

## 2022-08-05 MED ORDER — SERTRALINE HCL 100 MG PO TABS: 150.0000 mg | ORAL_TABLET | Freq: Every day | ORAL | 1 refills | Status: DC

## 2022-08-05 NOTE — Assessment & Plan Note (Signed)
Worsening anxiety x37month due recent death of fried and nephew. He has weekly sessions with a therapist which have been helpful. Denies any SI/HI/hallucinations.  Increase zoloft to 1581mMaintain therapy sessions

## 2022-08-05 NOTE — Progress Notes (Signed)
Stable Follow instructions as discussed during office visit.

## 2022-08-05 NOTE — Progress Notes (Signed)
Established Patient Visit  Patient: Russell Brown   DOB: [birth year]   77 y.o. Male  MRN: [id number] Visit Date: 08/05/2022  Subjective:    Chief Complaint  Patient presents with   Anxiety    3 mos f/u- Pt states" the anxiety is getting worse"  He also c/o  dry cough, decreased appetite, weight loss, sob, tightness in chest- for over a month.    HPI  Accompanied by wife.  PVD (peripheral vascular disease) (HCC) No LE edema or neuropathy or ulcers Normal pedal pulses  ILD (interstitial lung disease) ([place]) Reports unable to tolerate OFEV, med discontinued 33month ago. We scheduled appt with [facility]pulmonology 08/06/2022  Emphysema lung (HCC) Reports worsening cough (productive in AM) and SOB. Associated with chills Get CXR, cbc and BNP today Advise to use maintain anoro and albuterol as prescribed  Hypothyroidism Reports decrease appetite, nausea and increased anxiety. Repeat TSH and T4: stable  Maintain levothyroxine dose Wt Readings from Last 3 Encounters:  08/05/22 172 lb (78 kg)  06/08/22 176 lb 9.4 oz (80.1 kg)  05/28/22 179 lb 3.7 oz (81.3 kg)     PTSD (post-traumatic stress disorder) Worsening anxiety x257monthdue recent death of fried and nephew. He has weekly sessions with a therapist which have been helpful. Denies any SI/HI/hallucinations.  Increase zoloft to 15083maintain therapy sessions  BP Readings from Last 3 Encounters:  08/05/22 114/70  05/28/22 (!) 142/70  05/05/22 118/65    Wt Readings from Last 3 Encounters:  08/05/22 172 lb (78 kg)  06/08/22 176 lb 9.4 oz (80.1 kg)  05/28/22 179 lb 3.7 oz (81.3 kg)    Review of Systems  Constitutional:  Positive for chills and malaise/fatigue. Negative for fever and weight loss.  HENT:  Negative for congestion and sore throat.   Eyes:        Negative for visual changes  Respiratory:  Positive for cough, sputum production, shortness of breath and wheezing.   Cardiovascular:  Negative for  chest pain, palpitations, orthopnea and leg swelling.  Gastrointestinal:  Positive for nausea and vomiting. Negative for blood in stool, constipation, diarrhea, heartburn and melena.  Genitourinary:  Negative for dysuria, frequency, hematuria and urgency.  Musculoskeletal:  Negative for falls, joint pain and myalgias.  Skin:  Negative for rash.  Neurological:  Negative for dizziness, sensory change, loss of consciousness and headaches.  Endo/Heme/Allergies:  Does not bruise/bleed easily.  Psychiatric/Behavioral:  Positive for depression. Negative for hallucinations, substance abuse and suicidal ideas. The patient is nervous/anxious. The patient does not have insomnia.     Reviewed medical, surgical, and social history today  Medications: Outpatient Medications Prior to Visit  Medication Sig   ANORO ELLIPTA 62.5-25 MCG/ACT AEPB USE 1 INHALATION BY MOUTH  DAILY   aspirin EC 81 MG tablet Take 81 mg by mouth daily.   atorvastatin (LIPITOR) 20 MG tablet Take 1 tablet (20 mg total) by mouth daily.   calcium citrate (CALCITRATE - DOSED IN MG ELEMENTAL CALCIUM) 950 (200 Ca) MG tablet Take 600 mg of elemental calcium by mouth daily.   cholecalciferol (VITAMIN D3) 25 MCG (1000 UNIT) tablet Take 800 Units by mouth daily.   dutasteride (AVODART) 0.5 MG capsule Take 1 capsule (0.5 mg total) by mouth daily.   fish oil-omega-3 fatty acids 1000 MG capsule Take 2 g by mouth daily.    levothyroxine (SYNTHROID) 75 MCG tablet TAKE 1 TABLET BY MOUTH DAILY  BEFORE BREAKFAST   lisinopril-hydrochlorothiazide (ZESTORETIC) 10-12.5 MG tablet TAKE 1 TABLET BY MOUTH DAILY   Multiple Vitamin (MULTIVITAMIN WITH MINERALS) TABS tablet Take 1 tablet by mouth daily.   nitroGLYCERIN (NITROSTAT) 0.4 MG SL tablet PLACE 1 TABLET UNDER THE TONGUE EVERY 5 MINUTES AS NEEDED   omeprazole (PRILOSEC) 40 MG capsule Take 1 capsule (40 mg total) by mouth 2 (two) times daily.   [DISCONTINUED] Nintedanib (OFEV) 150 MG CAPS Take 1 capsule  (150 mg total) by mouth 2 (two) times daily. (Patient not taking: Reported on 05/28/2022)   [DISCONTINUED] sertraline (ZOLOFT) 50 MG tablet Take 1.5 tablets (75 mg total) by mouth daily.   No facility-administered medications prior to visit.   Reviewed past medical and social history.       Objective:  BP 114/70 (BP Location: Left Arm, Patient Position: Sitting, Cuff Size: Normal)   Pulse 77   Temp 98.3 F (36.8 C) (Temporal)   Resp 16   Ht 6' 1"$  (1.854 m)   Wt 172 lb (78 kg)   SpO2 97%   BMI 22.69 kg/m      Physical Exam Vitals reviewed.  Constitutional:      General: He is not in acute distress. Cardiovascular:     Rate and Rhythm: Normal rate and regular rhythm.     Pulses: Normal pulses.     Heart sounds: Normal heart sounds.  Pulmonary:     Effort: Pulmonary effort is normal. No respiratory distress.     Breath sounds: Normal breath sounds.  Abdominal:     General: Bowel sounds are normal. There is no distension.     Palpations: Abdomen is soft. There is no mass.     Tenderness: There is no abdominal tenderness. There is no guarding.  Musculoskeletal:     Right lower leg: No edema.     Left lower leg: No edema.  Neurological:     Mental Status: He is alert and oriented to person, place, and time.  Psychiatric:        Attention and Perception: Attention normal.        Mood and Affect: Mood is anxious.        Speech: Speech is rapid and pressured.        Behavior: Behavior is cooperative.        Thought Content: Thought content normal.        Cognition and Memory: Cognition normal.        Judgment: Judgment normal.     Results for orders placed or performed in visit on 08/05/22  CBC with Differential/Platelet  Result Value Ref Range   WBC 7.4 4.0 - 10.5 K/uL   RBC 5.22 4.22 - 5.81 Mil/uL   Hemoglobin 16.1 13.0 - 17.0 g/dL   HCT 47.1 39.0 - 52.0 %   MCV 90.3 78.0 - 100.0 fl   MCHC 34.2 30.0 - 36.0 g/dL   RDW 13.7 11.5 - 15.5 %   Platelets 210.0 150.0 -  400.0 K/uL   Neutrophils Relative % 74.5 43.0 - 77.0 %   Lymphocytes Relative 13.4 12.0 - 46.0 %   Monocytes Relative 8.6 3.0 - 12.0 %   Eosinophils Relative 3.0 0.0 - 5.0 %   Basophils Relative 0.5 0.0 - 3.0 %   Neutro Abs 5.5 1.4 - 7.7 K/uL   Lymphs Abs 1.0 0.7 - 4.0 K/uL   Monocytes Absolute 0.6 0.1 - 1.0 K/uL   Eosinophils Absolute 0.2 0.0 - 0.7 K/uL   Basophils Absolute  0.0 0.0 - 0.1 K/uL  TSH  Result Value Ref Range   TSH 1.97 0.35 - 5.50 uIU/mL  T4, free  Result Value Ref Range   Free T4 1.36 0.60 - 1.60 ng/dL  Comprehensive metabolic panel  Result Value Ref Range   Sodium 138 135 - 145 mEq/L   Potassium 3.9 3.5 - 5.1 mEq/L   Chloride 100 96 - 112 mEq/L   CO2 26 19 - 32 mEq/L   Glucose, Bld 92 70 - 99 mg/dL   BUN 14 6 - 23 mg/dL   Creatinine, Ser 1.00 0.40 - 1.50 mg/dL   Total Bilirubin 1.0 0.2 - 1.2 mg/dL   Alkaline Phosphatase 63 39 - 117 U/L   AST 14 0 - 37 U/L   ALT 19 0 - 53 U/L   Total Protein 6.5 6.0 - 8.3 g/dL   Albumin 4.3 3.5 - 5.2 g/dL   GFR 73.03 >60.00 mL/min   Calcium 9.3 8.4 - 10.5 mg/dL  B Nat Peptide  Result Value Ref Range   Pro B Natriuretic peptide (BNP) 35.0 0.0 - 100.0 pg/mL      Assessment & Plan:    Problem List Items Addressed This Visit       Cardiovascular and Mediastinum   PVD (peripheral vascular disease) (HCC)    No LE edema or neuropathy or ulcers Normal pedal pulses        Respiratory   Emphysema lung (HCC)    Reports worsening cough (productive in AM) and SOB. Associated with chills Get CXR, cbc and BNP today Advise to use maintain anoro and albuterol as prescribed      Relevant Orders   DG Chest 2 View   ILD (interstitial lung disease) ([place])    Reports unable to tolerate OFEV, med discontinued 35month ago. We scheduled appt with [facility]ulmonology 08/06/2022      Relevant Orders   DG Chest 2 View     Endocrine   Hypothyroidism    Reports decrease appetite, nausea and increased anxiety. Repeat TSH and T4:  stable  Maintain levothyroxine dose Wt Readings from Last 3 Encounters:  08/05/22 172 lb (78 kg)  06/08/22 176 lb 9.4 oz (80.1 kg)  05/28/22 179 lb 3.7 oz (81.3 kg)         Relevant Orders   TSH (Completed)   T4, free (Completed)     Other   GAD (generalized anxiety disorder) - Primary   Relevant Medications   sertraline (ZOLOFT) 100 MG tablet   PTSD (post-traumatic stress disorder)    Worsening anxiety x218monthdue recent death of fried and nephew. He has weekly sessions with a therapist which have been helpful. Denies any SI/HI/hallucinations.  Increase zoloft to 150103maintain therapy sessions      Relevant Medications   sertraline (ZOLOFT) 100 MG tablet   Other Visit Diagnoses     Malaise and fatigue       Relevant Orders   CBC with Differential/Platelet (Completed)   Comprehensive metabolic panel (Completed)   DG Chest 2 View   B Nat Peptide (Completed)   Nausea and vomiting, unspecified vomiting type       Relevant Medications   ondansetron (ZOFRAN) 4 MG tablet      Return in about 4 weeks (around 09/02/2022) for depression and anxiety, nausea.     ChaWilfred LacyP

## 2022-08-05 NOTE — Assessment & Plan Note (Signed)
No LE edema or neuropathy or ulcers Normal pedal pulses

## 2022-08-05 NOTE — Assessment & Plan Note (Signed)
Reports decrease appetite, nausea and increased anxiety. Repeat TSH and T4: stable  Maintain levothyroxine dose Wt Readings from Last 3 Encounters:  08/05/22 172 lb (78 kg)  06/08/22 176 lb 9.4 oz (80.1 kg)  05/28/22 179 lb 3.7 oz (81.3 kg)

## 2022-08-05 NOTE — Assessment & Plan Note (Signed)
Reports worsening cough (productive in AM) and SOB. Associated with chills Get CXR, cbc and BNP today Advise to use maintain anoro and albuterol as prescribed

## 2022-08-05 NOTE — Patient Instructions (Addendum)
Go to lab Go to [facility] for CXR F/up with pulmonology as scheduled Use albuterol every 6hrs as needed for cough and SOB Use zofran as needed for nausea Maintain adequate daily oral hydration, 60oz Eat small frequent meals Drink 2 bottles of ensure daily for meal replacement.

## 2022-08-05 NOTE — Assessment & Plan Note (Signed)
Reports unable to tolerate OFEV, med discontinued 63month ago. We scheduled appt with [facility]ulmonology 08/06/2022

## 2022-08-06 ENCOUNTER — Encounter: Payer: Self-pay | Admitting: Acute Care

## 2022-08-06 ENCOUNTER — Ambulatory Visit (HOSPITAL_BASED_OUTPATIENT_CLINIC_OR_DEPARTMENT_OTHER)
Admission: RE | Admit: 2022-08-06 | Discharge: 2022-08-06 | Disposition: A | Discharge status: Home / Self Care | Payer: Medicare Other | Source: Ambulatory Visit | Attending: Acute Care | Admitting: Acute Care

## 2022-08-06 ENCOUNTER — Ambulatory Visit (INDEPENDENT_AMBULATORY_CARE_PROVIDER_SITE_OTHER): Payer: Medicare Other | Admitting: Acute Care

## 2022-08-06 VITALS — BP 116/68 | HR 67 | Temp 98.6°F | Ht 73.0 in | Wt 172.4 lb

## 2022-08-06 DIAGNOSIS — R051 Acute cough: Secondary | ICD-10-CM

## 2022-08-06 DIAGNOSIS — J441 Chronic obstructive pulmonary disease with (acute) exacerbation: Secondary | ICD-10-CM | POA: Diagnosis not present

## 2022-08-06 DIAGNOSIS — R0609 Other forms of dyspnea: Secondary | ICD-10-CM

## 2022-08-06 DIAGNOSIS — J849 Interstitial pulmonary disease, unspecified: Secondary | ICD-10-CM | POA: Diagnosis present

## 2022-08-06 MED ORDER — HYDROCODONE BIT-HOMATROP MBR 5-1.5 MG/5ML PO SOLN: 5.0000 mL | Freq: Every evening | ORAL | 0 refills | Status: DC | PRN

## 2022-08-06 MED ORDER — PREDNISONE 10 MG PO TABS: ORAL_TABLET | ORAL | 0 refills | Status: DC

## 2022-08-06 MED ORDER — DOXYCYCLINE HYCLATE 100 MG PO TABS: 100.0000 mg | ORAL_TABLET | Freq: Two times a day (BID) | ORAL | 0 refills | Status: DC

## 2022-08-06 NOTE — Patient Instructions (Addendum)
It is good to see you today. We will start you on prednisone taper. We will do 30 mg daily for 2 weeks, the 20 mg daily for 2 weeks , then 10 mg daily x 2 weeks , then 5 mg daily x 2 weeks then stop. Sips of water instead of throat clearing Sugar Free [employer] or [employer] originals for throat soothing. Delsym Cough syrup 5 cc's every 12 hours Non-sedating antihistamine of your choice daily ( Zyrtec, Allegra, Xyzol, Claritin ( Generic ok)  Doxycycline 100 mg BID x 7 days.  Take probiotic with antibiotic , Activia yogurt or Culturelle . We will send in a cough medication for bedtime. This does have Codeine so do not drive if sleepy. CT Chest without contrast You will get a call to get this scheduled.  Follow up in 2 weeks with Dr. Vaughan Browner or Judson Roch NP Please contact office for sooner follow up if symptoms do not improve or worsen or seek emergency care

## 2022-08-06 NOTE — Progress Notes (Signed)
History of Present Illness Russell Brown is a 77 y.o. male  history of COPD, asthma, GERD, hiatal hernia status post Nissen's fundoplication.  He is followed by Dr. Halford Chessman and Dr. Vaughan Browner . Referred for CT scan findings showing pulmonary fibrosis He has worsening dyspnea on exertion over several years, nonproductive cough.  No wheezing maintained on anoro inhaler. Unable to tolerate OFEV and  discontinued 04/2022.    08/06/2022 Pt. Presents for an acute visit. He has had a worsening cough for a few weeks. It is getting no better. Cough is non productive. He has been taking Delsym over the counter for symptomatic relief.He states he thinks he has pneumonia and had a CXR done at [facility] yesterday. The results were pending at the time I saw the patient in the office.  Per his wife, he has no appetite and is eating very little. He is weak, and has lost weight. She is concerned there may be an additional issue.  Pt. Denies fever , chest pain or hemoptysis. No secretions . He does have a bit of post nasal drip which may be triggering his cough.  Test Results: 08/06/2022>> Covid 19/ Flu A&B, RSV swab>> pending  08/04/2022 CT Chest No pneumothorax. No pleural effusion. Severe centrilobular emphysema with mild diffuse bronchial wall thickening. No acute consolidative airspace disease, lung masses or significant pulmonary nodules. Mild patchy subpleural reticulation and ground-glass opacity in both lungs with associated mild traction bronchiectasis and architectural distortion. Relative sparing of the upper lungs. No appreciable interval progression.  No acute consolidative airspace disease to suggest a pneumonia. Severe centrilobular emphysema with mild diffuse bronchial wall thickening, suggesting COPD. 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 No  appreciable interval progression. Two-vessel coronary atherosclerosis. Small to moderate hiatal hernia. Diffuse osteopenia. Aortic Atherosclerosis    CXR 08/05/2022 No acute cardiopulmonary disease. Lung hyperinflation and emphysema, suggesting COPD. Patchy peripheral basilar predominant reticular opacities in both lungs without appreciable interval change, compatible with fibrotic interstitial lung disease as detailed on 05/17/2022 high-resolution chest CT study.      Latest Ref Rng & Units 08/05/2022    9:53 AM 04/08/2022    9:33 AM 05/06/2021    9:15 AM  CBC  WBC 4.0 - 10.5 K/uL 7.4  7.1  7.2   Hemoglobin 13.0 - 17.0 g/dL 16.1  15.4  15.6   Hematocrit 39.0 - 52.0 % 47.1  46.7  47.0   Platelets 150.0 - 400.0 K/uL 210.0  203.0  215.0        Latest Ref Rng & Units 08/05/2022    9:53 AM 04/08/2022    9:33 AM 09/16/2021    9:58 AM  BMP  Glucose 70 - 99 mg/dL 92  90  115   BUN 6 - 23 mg/dL 14  14  16   $ Creatinine 0.40 - 1.50 mg/dL 1.00  1.04  0.99   Sodium 135 - 145 mEq/L 138  138  145   Potassium 3.5 - 5.1 mEq/L 3.9  4.2  4.4   Chloride 96 - 112 mEq/L 100  103  104   CO2 19 - 32 mEq/L 26  29  30   $ Calcium 8.4 - 10.5 mg/dL 9.3  9.1  9.5     BNP No results found for: "BNP"  ProBNP    Component Value Date/Time   PROBNP 35.0 08/05/2022 0953    PFT  Component Value Date/Time   FEV1PRE 1.85 08/12/2021 1149   FEV1POST 2.00 08/12/2021 1149   FVCPRE 3.53 08/12/2021 1149   FVCPOST 3.68 08/12/2021 1149   TLC 5.72 08/12/2021 1149   DLCOUNC 13.26 08/12/2021 1149   PREFEV1FVCRT 53 08/12/2021 1149   PSTFEV1FVCRT 54 08/12/2021 1149    No results found.   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   CAP (community acquired pneumonia) 03/03/2012   COPD (chronic obstructive pulmonary disease) (HCC)    GERD (gastroesophageal reflux  disease)    History of hiatal hernia    Hyperlipemia    Hypertension    Hypothyroidism    Pneumonia 2013; 2017   Preventative health care 12/06/2016   PTSD (post-traumatic stress disorder)    retired Engineer, structural    Pulmonary fibrosis ([place])    Sleep apnea    mild - but does not wear a c-pap per pt   Thyroid disease    thyroid nodules - half of thyroid was removed per pt     Social History   Tobacco Use   Smoking status: Former    Packs/day: 1.00    Years: 35.00    Total pack years: 35.00    Types: Cigarettes    Quit date: 08/18/1996    Years since quitting: 25.9    Passive exposure: Past   Smokeless tobacco: Never  Vaping Use   Vaping Use: Never used  Substance Use Topics   Alcohol use: Not Currently   Drug use: Never    Mr.Prickett reports that he quit smoking about 25 years ago. His smoking use included cigarettes. He has a 35.00 pack-year smoking history. He has been exposed to tobacco smoke. He has never used smokeless tobacco. He reports that he does not currently use alcohol. He reports that he does not use drugs.  Tobacco Cessation: Former smoker , quit 1998 with 35 pack year smoking history   Past surgical hx, Family hx, Social hx all reviewed.  Current Outpatient Medications on File Prior to Visit  Medication Sig   ANORO ELLIPTA 62.5-25 MCG/ACT AEPB USE 1 INHALATION BY MOUTH  DAILY   aspirin EC 81 MG tablet Take 81 mg by mouth daily.   atorvastatin (LIPITOR) 20 MG tablet Take 1 tablet (20 mg total) by mouth daily.   calcium citrate (CALCITRATE - DOSED IN MG ELEMENTAL CALCIUM) 950 (200 Ca) MG tablet Take 600 mg of elemental calcium by mouth daily.   cholecalciferol (VITAMIN D3) 25 MCG (1000 UNIT) tablet Take 800 Units by mouth daily.   dutasteride (AVODART) 0.5 MG capsule Take 1 capsule (0.5 mg total) by mouth daily.   fish oil-omega-3 fatty acids 1000 MG capsule Take 2 g by mouth daily.    levothyroxine (SYNTHROID) 75 MCG tablet TAKE 1 TABLET BY MOUTH  DAILY  BEFORE BREAKFAST   lisinopril-hydrochlorothiazide (ZESTORETIC) 10-12.5 MG tablet TAKE 1 TABLET BY MOUTH DAILY   Multiple Vitamin (MULTIVITAMIN WITH MINERALS) TABS tablet Take 1 tablet by mouth daily.   omeprazole (PRILOSEC) 40 MG capsule Take 1 capsule (40 mg total) by mouth 2 (two) times daily.   ondansetron (ZOFRAN) 4 MG tablet Take 1 tablet (4 mg total) by mouth every 8 (eight) hours as needed for nausea or vomiting. (Patient taking differently: Take 150 mg by mouth every 8 (eight) hours as needed for nausea  or vomiting.)   sertraline (ZOLOFT) 100 MG tablet Take 1.5 tablets (150 mg total) by mouth daily.   nitroGLYCERIN (NITROSTAT) 0.4 MG SL tablet PLACE 1 TABLET UNDER THE TONGUE EVERY 5 MINUTES AS NEEDED   No current facility-administered medications on file prior to visit.     Allergies  Allergen Reactions   Codeine Anxiety    Reaction if taken for extended periods of time.    Review Of Systems:  Constitutional:   +  weight loss, No night sweats,  Fevers, chills,+  fatigue, or  lassitude.  HEENT:   No headaches,  Difficulty swallowing,  Tooth/dental problems, or  Sore throat,                No sneezing, itching, ear ache, + nasal congestion, + post nasal drip,   CV:  No chest pain,  Orthopnea, PND, swelling in lower extremities, anasarca, dizziness, palpitations, syncope.   GI  No heartburn, indigestion, abdominal pain, nausea, vomiting, diarrhea, change in bowel habits, + loss of appetite, bloody stools.   Resp: + shortness of breath with exertion or at rest.  No excess mucus, no productive cough,  + non-productive cough,  No coughing up of blood.  No change in color of mucus.  No wheezing.  No chest wall deformity  Skin: no rash or lesions.  GU: no dysuria, change in color of urine, no urgency or frequency.  No flank pain, no hematuria   MS:  No joint pain or swelling.  No decreased range of motion.  No back pain.  Psych:  No change in mood or affect. No depression  or anxiety.  No memory loss.   Vital Signs BP 116/68   Pulse 67   Temp 98.6 F (37 C) (Oral)   Ht 6' 1"$  (1.854 m)   Wt 172 lb 6.4 oz (78.2 kg)   SpO2 97%   BMI 22.75 kg/m    Physical Exam:  General- No distress,  A&Ox3, pleasant ENT: No sinus tenderness, TM clear, pale nasal mucosa, no oral exudate,+ post nasal drip, no LAN Cardiac: S1, S2, regular rate and rhythm, no murmur Chest: No wheeze/ rales/ dullness; no accessory muscle use, no nasal flaring, no sternal retractions, crackles per bases bilaterally Abd.: Soft Non-tender, ND, BS +, Body mass index is 22.75 kg/m.  Ext: No clubbing cyanosis, edema Neuro:  normal strength, but per wife weaker than his baseline Skin: No rashes, warm and dry, no lesions  Psych: normal mood and behavior   Assessment/Plan Worsening Dyspnea, non-productive cough,  CXR and CT chest show no acute issues COPD Flare vs ILD flare vs viral trigger Plan We will start you on prednisone taper. We will do 30 mg daily for 2 weeks, the 20 mg daily for 2 weeks , then 10 mg daily x 2 weeks , then 5 mg daily x 2 weeks then stop. Sips of water instead of throat clearing Sugar Free [employer] or [employer] originals for throat soothing. Delsym Cough syrup 5 cc's every 12 hours Non-sedating antihistamine of your choice daily ( Zyrtec, Allegra, Xyzol, Claritin ( Generic ok)  Doxycycline 100 mg BID x 7 days.  Take probiotic with antibiotic , Activia yogurt or Culturelle . We will send in a cough medication for bedtime. Take 5 cc at bedtime for sleep This does have Codeine so do not drive if sleepy. CT Chest without contrast You will get a call to get this scheduled.  I will call you with the results Follow  up in 2 weeks with Dr. Vaughan Browner or Judson Roch NP Please contact office for sooner follow up if symptoms do not improve or worsen or seek emergency care    I spent 40 minutes dedicated to the care of this patient on the date of this encounter to include  pre-visit review of records, face-to-face time with the patient discussing conditions above, post visit ordering of testing, clinical documentation with the electronic health record, making appropriate referrals as documented, and communicating necessary information to the patient's healthcare team.    Magdalen Spatz, NP 08/06/2022  8:37 AM

## 2022-08-06 NOTE — Progress Notes (Signed)
No pneumonia F/up with pulmonology as discussed

## 2022-08-08 LAB — COVID-19, FLU A+B AND RSV
Influenza A, NAA: NOT DETECTED
Influenza B, NAA: NOT DETECTED
RSV, NAA: NOT DETECTED
SARS-CoV-2, NAA: NOT DETECTED

## 2022-08-10 ENCOUNTER — Ambulatory Visit (INDEPENDENT_AMBULATORY_CARE_PROVIDER_SITE_OTHER): Payer: Medicare Other | Admitting: Psychology

## 2022-08-10 DIAGNOSIS — F4323 Adjustment disorder with mixed anxiety and depressed mood: Secondary | ICD-10-CM

## 2022-08-10 NOTE — Progress Notes (Signed)
[facility] Counselor/Therapist Progress Note  Patient ID: Russell Brown, MRN: [id number],    Date: 08/10/2022  Time Spent: 3:00pm-3:55pm   55 minutes   Treatment Type: Individual Therapy  Reported Symptoms: stress, anxiety  Mental Status Exam: Appearance:  Casual     Behavior: Appropriate  Motor: Normal  Speech/Language:  Normal Rate  Affect: Appropriate  Mood: normal  Thought process: normal  Thought content:   WNL  Sensory/Perceptual disturbances:   WNL  Orientation: oriented to person, place, time/date, and situation  Attention: Good  Concentration: Good  Memory: WNL  Fund of knowledge:  Good  Insight:   Good  Judgment:  Good  Impulse Control: Good   Risk Assessment: Danger to Self:  No Self-injurious Behavior: No Danger to Others: No Duty to Warn:no Physical Aggression / Violence:No  Access to Firearms a concern: No  Gang Involvement:No   Subjective: Pt present for face-to-face individual therapy via video Webex.  Pt consents to telehealth video session due to COVID 19 pandemic. Location of pt: home Location of therapist: home office.   Pt has followed through with doing the meditation exercises that were recommended and it is helping him.   It has improved his anxiety and stress reactions.   Pt talked about his health.  He has been very sick this past week.  Pt has had a bad cough.  He went to the pulmonary doctor.  He was prescribed 5 medications.  He saw his PCP and she increased pt's Zoloft to 13m.  Addressed pt's health concerns. Pt talked about his oldest son MLegrand Brown  Pt has been his "protector" for many years but needs to "cut him loose" for the sake of pt's marriage and mental health.  It is very hard for pt to deal with this emotionally.   MLegrand Comowas a difficult child and has anger issues.  MLegrand Comois impulsive.  He did not do well in school.  MLegrand Comohas ADHD and a learning disability.   MLegrand Comowas enrolled in [employer]  MLegrand Comoflourished there but it was very expensive so pt could not afford for MLegrand Comoto continue to go there his junior and senior years.   Pt feels very guilty bc he thinks Russell's life could have been different/better if he could have stayed there.  Pt tends to be hard on himself and holds onto guilt feelings.  Helped pt process his feelings. Pt talked about his best friend who committed suicide in December.  Pt misses him.  They use to do a lot together.   Pt talked about the death of his nephew who was murdered in January.  Pt's nephew was a pEngineer, structural    Worked on self care strategies.  Provided supportive therapy.      Interventions: Cognitive Behavioral Therapy and Insight-Oriented  Diagnosis: F43.23  Plan of Care: Recommend ongoing therapy.  Pt participated in setting treatment goals.  He is having trouble dealing with his best friend's suicide and his nephew's murder.   Pt wants help dealing with his son bc of the conflict with his wife over helping his son out financially.  Pt wants to improve coping skills.  Plan to meet every one to two weeks.    Treatment Plan (target date 08/04/2023) Client Abilities/Strengths  Pt is bright, engaging, and motivated for therapy.  Client Treatment Preferences  Individual therapy.  Client Statement of Needs  Improve copings skills and understand herself better. Improve self esteem.  Symptoms  Depressed  or irritable mood. Excessive and/or unrealistic worry that is difficult to control occurring more days than not for at least 6 months about a number of events or activities. Hypervigilance (e.g., feeling constantly on edge, experiencing concentration difficulties, having trouble falling or staying asleep, exhibiting a general state of irritability). Low self-esteem. Problems Addressed  Unipolar Depression, Anxiety Goals 1. Alleviate depressive symptoms and return to previous level of effective functioning. 2. Appropriately grieve  the loss in order to normalize mood and to return to previously adaptive level of functioning. Objective Learn and implement behavioral strategies to overcome depression. Target Date: 2023-08-04 Frequency: Biweekly  Progress: 10 Modality: individual  Related Interventions Assist the client in developing skills that increase the likelihood of deriving pleasure from behavioral activation (e.g., assertiveness skills, developing an exercise plan, less internal/more external focus, increased social involvement); reinforce success. Engage the client in "behavioral activation," increasing his/her activity level and contact with sources of reward, while identifying processes that inhibit activation. use behavioral techniques such as instruction, rehearsal, role-playing, role reversal, as needed, to facilitate activity in the client's daily life; reinforce success. 3. Develop healthy interpersonal relationships that lead to the alleviation and help prevent the relapse of depression. 4. Develop healthy thinking patterns and beliefs about self, others, and the world that lead to the alleviation and help prevent the relapse of depression. 5. Enhance ability to effectively cope with the full variety of life's worries and anxieties. 6. Learn and implement coping skills that result in a reduction of anxiety and worry, and improved daily functioning. Objective Learn and implement problem-solving strategies for realistically addressing worries. Target Date: 2023-08-04 Frequency: Biweekly  Progress: 10 Modality: individual  Related Interventions Assign the client a homework exercise in which he/she problem-solves a current problem (see Mastery of Your Anxiety and Worry: Workbook by Adora Fridge and Eliot Ford or Generalized Anxiety Disorder by Eather Colas, and Eliot Ford); review, reinforce success, and provide corrective feedback toward improvement. Teach the client problem-solving strategies involving specifically defining a  problem, generating options for addressing it, evaluating the pros and cons of each option, selecting and implementing an optional action, and reevaluating and refining the action. Objective Learn and implement calming skills to reduce overall anxiety and manage anxiety symptoms. Target Date: 2023-08-04 Frequency: Biweekly  Progress: 10 Modality: individual  Related Interventions Assign the client to read about progressive muscle relaxation and other calming strategies in relevant books or treatment manuals (e.g., Progressive Relaxation Training by Leroy Kennedy; Mastery of Your Anxiety and Worry: Workbook by Beckie Busing). Assign the client homework each session in which he/she practices relaxation exercises daily, gradually applying them progressively from non-anxiety-provoking to anxiety-provoking situations; review and reinforce success while providing corrective feedback toward improvement. Teach the client calming/relaxation skills (e.g., applied relaxation, progressive muscle relaxation, cue controlled relaxation; mindful breathing; biofeedback) and how to discriminate better between relaxation and tension; teach the client how to apply these skills to his/her daily life. 7. Recognize, accept, and cope with feelings of depression. 8. Reduce overall frequency, intensity, and duration of the anxiety so that daily functioning is not impaired. 9. Resolve the core conflict that is the source of anxiety. 10. Stabilize anxiety level while increasing ability to function on a daily basis. Diagnosis F43.23 Conditions For Discharge Achievement of treatment goals and objectives   Clint Bolder, LCSW

## 2022-08-13 MED ORDER — ALBUTEROL SULFATE HFA 108 (90 BASE) MCG/ACT IN AERS: 2.0000 | INHALATION_SPRAY | Freq: Four times a day (QID) | RESPIRATORY_TRACT | 2 refills | Status: AC | PRN

## 2022-08-20 ENCOUNTER — Ambulatory Visit (INDEPENDENT_AMBULATORY_CARE_PROVIDER_SITE_OTHER): Payer: Medicare Other | Admitting: Acute Care

## 2022-08-20 ENCOUNTER — Encounter: Payer: Self-pay | Admitting: Acute Care

## 2022-08-20 VITALS — BP 120/66 | HR 85 | Temp 97.9°F | Ht 73.0 in | Wt 173.2 lb

## 2022-08-20 DIAGNOSIS — J849 Interstitial pulmonary disease, unspecified: Secondary | ICD-10-CM | POA: Diagnosis not present

## 2022-08-20 DIAGNOSIS — J4489 Other specified chronic obstructive pulmonary disease: Secondary | ICD-10-CM | POA: Diagnosis not present

## 2022-08-20 DIAGNOSIS — R0609 Other forms of dyspnea: Secondary | ICD-10-CM | POA: Diagnosis not present

## 2022-08-20 NOTE — Progress Notes (Unsigned)
History of Present Illness Russell Brown is a 77 y.o. male with history of COPD, asthma, GERD, hiatal hernia status post Nissen's fundoplication.  He is followed by Dr. Halford Chessman and Dr. Vaughan Browner . Referred for CT scan findings showing pulmonary fibrosis He has worsening dyspnea on exertion over several years, nonproductive cough.  No wheezing maintained on anoro inhaler. Unable to tolerate OFEV and  discontinued 04/2022.    Seen 08/06/2022 for suspected ILD vs COPD flare   08/20/2022 Follow up for suspected ILD Flare vs .COPD Flare vs Viral Asthma trigger. He was seen 08/06/2022 and  treated with Doxycycline and a slow prednisone taper. CT Chest  showed no acute issues . He has completed the antibiotic, and is tapering the prednisone. He was also using Delsym to control his dry cough. He initially was not improving, had continued cough and mental fogginess. He suspected it may be the Delsym, and stopped taking it. He was better the same night..Cough has almost resolved. Secretions are clear to yellow. No fever.He is almost back to baseline. We have added Dextromethorphan as an ADR in the medical record.  Pt has not  had PFT's  since 2022. I will order for 2 months ( Once he is back to his baseline) , and he needs follow up with Dr. Vaughan Browner  after as part of his ILD surveillance. He will also need a 6 minute walk.    Test Results: CT Chest without contrast 08/06/2022 No acute consolidative airspace disease to suggest a pneumonia. 2. Severe centrilobular emphysema with mild diffuse bronchial wall thickening, suggesting COPD. 3. 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 No appreciable interval progression. 4. Two-vessel coronary atherosclerosis. 5. Small to moderate hiatal hernia. 6. Diffuse osteopenia. 7. Aortic Atherosclerosis (ICD10-I70.0) and Emphysema  (ICD10-J43.9).   CXR 08/06/2022 No acute cardiopulmonary disease. Lung hyperinflation and emphysema, suggesting COPD. Patchy peripheral basilar predominant reticular opacities in both lungs without appreciable interval change, compatible with fibrotic interstitial lung disease as detailed on 05/17/2022 high-resolution chest CT study.     Latest Ref Rng & Units 08/05/2022    9:53 AM 04/08/2022    9:33 AM 05/06/2021    9:15 AM  CBC  WBC 4.0 - 10.5 K/uL 7.4  7.1  7.2   Hemoglobin 13.0 - 17.0 g/dL 16.1  15.4  15.6   Hematocrit 39.0 - 52.0 % 47.1  46.7  47.0   Platelets 150.0 - 400.0 K/uL 210.0  203.0  215.0        Latest Ref Rng & Units 08/05/2022    9:53 AM 04/08/2022    9:33 AM 09/16/2021    9:58 AM  BMP  Glucose 70 - 99 mg/dL 92  90  115   BUN 6 - 23 mg/dL '14  14  16   '$ Creatinine 0.40 - 1.50 mg/dL 1.00  1.04  0.99   Sodium 135 - 145 mEq/L 138  138  145   Potassium 3.5 - 5.1 mEq/L 3.9  4.2  4.4   Chloride 96 - 112 mEq/L 100  103  104   CO2 19 - 32 mEq/L '26  29  30   '$ Calcium 8.4 - 10.5 mg/dL 9.3  9.1  9.5     BNP No results found for: "BNP"  ProBNP    Component Value Date/Time   PROBNP 35.0 08/05/2022 0953    PFT    Component Value Date/Time  FEV1PRE 1.85 08/12/2021 1149   FEV1POST 2.00 08/12/2021 1149   FVCPRE 3.53 08/12/2021 1149   FVCPOST 3.68 08/12/2021 1149   TLC 5.72 08/12/2021 1149   DLCOUNC 13.26 08/12/2021 1149   PREFEV1FVCRT 53 08/12/2021 1149   PSTFEV1FVCRT 54 08/12/2021 1149    DG Chest 2 View  Result Date: 08/06/2022 CLINICAL DATA:  worsening cough, SOB and chills EXAM: CHEST - 2 VIEW COMPARISON:  05/05/2020 chest radiograph. FINDINGS: Surgical clips overlie the mid and upper left abdomen, unchanged. Stable cardiomediastinal silhouette with normal heart size. No pneumothorax. No pleural effusion. Lung hyperinflation and emphysema. Patchy peripheral basilar predominant reticular opacities in both lungs without appreciable interval change. No  pulmonary edema. No acute consolidative airspace disease. IMPRESSION: 1. No acute cardiopulmonary disease. 2. Lung hyperinflation and emphysema, suggesting COPD. 3. Patchy peripheral basilar predominant reticular opacities in both lungs without appreciable interval change, compatible with fibrotic interstitial lung disease as detailed on 05/17/2022 high-resolution chest CT study. Electronically Signed   By: Ilona Sorrel M.D.   On: 08/06/2022 11:01   CT CHEST WO CONTRAST  Result Date: 08/06/2022 CLINICAL DATA:  Worsening cough, dyspnea and chills. History of interstitial lung disease. EXAM: CT CHEST WITHOUT CONTRAST TECHNIQUE: Multidetector CT imaging of the chest was performed following the standard protocol without IV contrast. RADIATION DOSE REDUCTION: This exam was performed according to the departmental dose-optimization program which includes automated exposure control, adjustment of the mA and/or kV according to patient size and/or use of iterative reconstruction technique. COMPARISON:  Chest radiograph from one day prior. 05/17/2022 high-resolution chest CT. FINDINGS: Cardiovascular: Normal heart size. No significant pericardial effusion/thickening. Left anterior descending and right coronary atherosclerosis. Atherosclerotic nonaneurysmal thoracic aorta. Normal caliber pulmonary arteries. Mediastinum/Nodes: Right hemithyroidectomy. No significant left thyroid nodules. Unremarkable esophagus. No pathologically enlarged axillary, mediastinal or hilar lymph nodes, noting limited sensitivity for the detection of hilar adenopathy on this noncontrast study. Lungs/Pleura: No pneumothorax. No pleural effusion. Severe centrilobular emphysema with mild diffuse bronchial wall thickening. No acute consolidative airspace disease, lung masses or significant pulmonary nodules. Mild patchy subpleural reticulation and ground-glass opacity in both lungs with associated mild traction bronchiectasis and architectural  distortion. Relative sparing of the upper lungs. No appreciable interval progression. Upper abdomen: Small to moderate hiatal hernia. Musculoskeletal: No aggressive appearing focal osseous lesions. Moderate thoracic spondylosis. Diffuse osteopenia. IMPRESSION: 1. No acute consolidative airspace disease to suggest a pneumonia. 2. Severe centrilobular emphysema with mild diffuse bronchial wall thickening, suggesting COPD. 3. 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 No appreciable interval progression. 4. Two-vessel coronary atherosclerosis. 5. Small to moderate hiatal hernia. 6. Diffuse osteopenia. 7. Aortic Atherosclerosis (ICD10-I70.0) and Emphysema (ICD10-J43.9). Electronically Signed   By: Ilona Sorrel M.D.   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   CAP (community acquired pneumonia) 03/03/2012   COPD (chronic obstructive pulmonary disease) (HCC)    GERD (gastroesophageal reflux disease)    History of hiatal hernia    Hyperlipemia    Hypertension    Hypothyroidism    Pneumonia 2013; 2017   Preventative health care 12/06/2016   PTSD (post-traumatic stress disorder)    retired Engineer, structural    Pulmonary fibrosis [facility])    Sleep apnea  mild - but does not wear a c-pap per pt   Thyroid disease    thyroid nodules - half of thyroid was removed per pt     Social History   Tobacco Use   Smoking status: Former    Packs/day: 1.00    Years: 35.00    Total pack years: 35.00    Types: Cigarettes    Quit date: 08/18/1996    Years since quitting: 26.0    Passive exposure: Past   Smokeless tobacco: Never  Vaping Use   Vaping Use: Never used  Substance Use Topics   Alcohol use: Not Currently   Drug  use: Never    Mr.Umana reports that he quit smoking about 26 years ago. His smoking use included cigarettes. He has a 35.00 pack-year smoking history. He has been exposed to tobacco smoke. He has never used smokeless tobacco. He reports that he does not currently use alcohol. He reports that he does not use drugs.  Tobacco Cessation: Former smoker , quit 1998 with 35 pack year smoking history    Past surgical hx, Family hx, Social hx all reviewed.  Current Outpatient Medications on File Prior to Visit  Medication Sig   albuterol (VENTOLIN HFA) 108 (90 Base) MCG/ACT inhaler Inhale 2 puffs into the lungs every 6 (six) hours as needed for wheezing or shortness of breath.   ANORO ELLIPTA 62.5-25 MCG/ACT AEPB USE 1 INHALATION BY MOUTH  DAILY   aspirin EC 81 MG tablet Take 81 mg by mouth daily.   atorvastatin (LIPITOR) 20 MG tablet Take 1 tablet (20 mg total) by mouth daily.   calcium citrate (CALCITRATE - DOSED IN MG ELEMENTAL CALCIUM) 950 (200 Ca) MG tablet Take 600 mg of elemental calcium by mouth daily.   cholecalciferol (VITAMIN D3) 25 MCG (1000 UNIT) tablet Take 800 Units by mouth daily.   dutasteride (AVODART) 0.5 MG capsule Take 1 capsule (0.5 mg total) by mouth daily.   fish oil-omega-3 fatty acids 1000 MG capsule Take 2 g by mouth daily.    HYDROcodone bit-homatropine (HYCODAN) 5-1.5 MG/5ML syrup Take 5 mLs by mouth at bedtime as needed for cough.   levothyroxine (SYNTHROID) 75 MCG tablet TAKE 1 TABLET BY MOUTH DAILY  BEFORE BREAKFAST   lisinopril-hydrochlorothiazide (ZESTORETIC) 10-12.5 MG tablet TAKE 1 TABLET BY MOUTH DAILY   Multiple Vitamin (MULTIVITAMIN WITH MINERALS) TABS tablet Take 1 tablet by mouth daily.   omeprazole (PRILOSEC) 40 MG capsule Take 1 capsule (40 mg total) by mouth 2 (two) times daily.   ondansetron (ZOFRAN) 4 MG tablet Take 1 tablet (4 mg total) by mouth every 8 (eight) hours as needed for nausea or vomiting. (Patient taking differently: Take 150 mg by mouth  every 8 (eight) hours as needed for nausea or vomiting.)   predniSONE (DELTASONE) 10 MG tablet Take 30 mg daily for 2 weeks, the 20 mg daily for 2 weeks , then 10 mg daily x 2 weeks , then 5 mg daily x 2 weeks then stop.   sertraline (ZOLOFT) 100 MG tablet Take 1.5 tablets (150 mg total) by mouth daily.   doxycycline (VIBRA-TABS) 100 MG tablet Take 1 tablet (100 mg total) by mouth 2 (two) times daily. (Patient not taking: Reported on 08/20/2022)   nitroGLYCERIN (NITROSTAT) 0.4 MG SL tablet PLACE 1 TABLET UNDER THE TONGUE EVERY 5 MINUTES AS NEEDED   No current facility-administered medications on file prior to visit.     Allergies  Allergen Reactions   Codeine Anxiety  Reaction if taken for extended periods of time.    Review Of Systems:  Constitutional:   No  weight loss, night sweats,  Fevers, chills, improving fatigue, or  lassitude.  HEENT:   No headaches,  Difficulty swallowing,  Tooth/dental problems, or  Sore throat,                No sneezing, itching, ear ache, nasal congestion, post nasal drip,   CV:  No chest pain,  Orthopnea, PND, swelling in lower extremities, anasarca, dizziness, palpitations, syncope.   GI  No heartburn, indigestion, abdominal pain, nausea, vomiting, diarrhea, change in bowel habits, loss of appetite, bloody stools.   Resp: , +but improving  shortness of breath with exertion much less at rest/ at rest.  + but improving excess mucus, no productive cough,  + non-productive cough,  No coughing up of blood.  No change in color of mucus.  No wheezing.  No chest wall deformity  Skin: no rash or lesions.  GU: no dysuria, change in color of urine, no urgency or frequency.  No flank pain, no hematuria   MS:  No joint pain or swelling.  No decreased range of motion.  No back pain.  Psych:  No change in mood or affect. No depression or anxiety.  No memory loss.   Vital Signs BP 120/66 (BP Location: Left Arm, Patient Position: Sitting, Cuff Size: Normal)    Pulse 85   Temp 97.9 F (36.6 C) (Oral)   Ht '6\' 1"'$  (1.854 m)   Wt 173 lb 3.2 oz (78.6 kg)   SpO2 96%   BMI 22.85 kg/m    Physical Exam:  General- No distress,  A&Ox3, pleasant ENT: No sinus tenderness, TM clear, pale nasal mucosa, no oral exudate,no post nasal drip, no LAN Cardiac: S1, S2, regular rate and rhythm, no murmur Chest: No wheeze/ rales/ dullness; no accessory muscle use, no nasal flaring, no sternal retractions, crackles per basesto mid back Abd.: Soft Non-tender, ND, BS +, Body mass index is 22.85 kg/m.  Ext: No clubbing cyanosis, edema Neuro:  normal strength, MAE x 4. A&O x 3 Skin: No rashes, warm and dry, No lesions  Psych: normal mood and behavior   Assessment/Plan Improving Dyspnea , cough after treatment with antibiotics and slow prednisone taper Plan We will stop Delsym and we will document ADR to Dextromethorphan Finish the prednisone taper as prescribed. Sips of water instead of throat clearing Sugar Free [employer] or [employer] originals for throat soothing. Non-sedating antihistamine of your choice daily ( Zyrtec, Allegra, Xyzol, Claritin ( Generic ok)  Follow up with Dr. Vaughan Browner in 2 months with PFT's prior.  Call if you need [place] sooner.  Please contact office for sooner follow up if symptoms do not improve or worsen or seek emergency care    I spent 35 minutes dedicated to the care of this patient on the date of this encounter to include pre-visit review of records, face-to-face time with the patient discussing conditions above, post visit ordering of testing, clinical documentation with the electronic health record, making appropriate referrals as documented, and communicating necessary information to the patient's healthcare team.      Magdalen Spatz, NP 08/20/2022  9:36 AM

## 2022-08-20 NOTE — Patient Instructions (Addendum)
It is good to see you today. We will stop Delsym and we will document ADR to Dextromethorphan Finish the prednisone taper as prescribed. Sips of water instead of throat clearing Sugar Free [employer] or [employer] originals for throat soothing. Non-sedating antihistamine of your choice daily ( Zyrtec, Allegra, Xyzol, Claritin ( Generic ok)  Follow up with Dr. Vaughan Browner in 2 months with PFT's prior.  Call if you need [place] sooner.  Please contact office for sooner follow up if symptoms do not improve or worsen or seek emergency care

## 2022-08-22 ENCOUNTER — Encounter: Payer: Self-pay | Admitting: Acute Care

## 2022-08-26 ENCOUNTER — Ambulatory Visit (INDEPENDENT_AMBULATORY_CARE_PROVIDER_SITE_OTHER): Payer: Medicare Other | Admitting: Psychology

## 2022-08-26 DIAGNOSIS — F4323 Adjustment disorder with mixed anxiety and depressed mood: Secondary | ICD-10-CM
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
## 2022-09-02 ENCOUNTER — Ambulatory Visit (INDEPENDENT_AMBULATORY_CARE_PROVIDER_SITE_OTHER): Payer: Medicare Other | Admitting: Psychology

## 2022-09-02 ENCOUNTER — Ambulatory Visit (INDEPENDENT_AMBULATORY_CARE_PROVIDER_SITE_OTHER): Payer: Medicare Other | Admitting: Nurse Practitioner

## 2022-09-02 ENCOUNTER — Encounter: Payer: Self-pay | Admitting: Nurse Practitioner

## 2022-09-02 VITALS — BP 100/72 | HR 72 | Resp 16 | Ht 73.0 in | Wt 168.2 lb

## 2022-09-02 DIAGNOSIS — F4323 Adjustment disorder with mixed anxiety and depressed mood: Secondary | ICD-10-CM

## 2022-09-02 DIAGNOSIS — T887XXA Unspecified adverse effect of drug or medicament, initial encounter: Secondary | ICD-10-CM

## 2022-09-02 DIAGNOSIS — F411 Generalized anxiety disorder: Secondary | ICD-10-CM | POA: Diagnosis not present

## 2022-09-02 DIAGNOSIS — F19982 Other psychoactive substance use, unspecified with psychoactive substance-induced sleep disorder: Secondary | ICD-10-CM | POA: Diagnosis not present

## 2022-09-02 LAB — GLUCOSE, POCT (MANUAL RESULT ENTRY): POC Glucose: 101 mg/dl — AB (ref 70–99)

## 2022-09-02 MED ORDER — MIRTAZAPINE 7.5 MG PO TABS: 7.5000 mg | ORAL_TABLET | Freq: Every day | ORAL | 1 refills | Status: DC

## 2022-09-02 NOTE — Patient Instructions (Signed)
Start remeron at bedtime to help with insomnia and poor appetite. This may cause drowsiness and fatigue. Do not drive or operate heavy machinery if these symptoms are present. Continue to avoid coffee consumption. Take meds with food

## 2022-09-02 NOTE — Assessment & Plan Note (Addendum)
Improving mood with zoloft and weekly CBT sessions Reports difficulty falling asleep with prednisone taper dose for pulmonary symptoms. Onset in last 2weeks. He stop coffee consumption 3days ago and no improvement. Also reports acute of chronic nausea, decreased appetite and epigastric discomfort x 5days. 4lbs weight loss noted in last 56month States he eats 3smalls meals daily and drinks 2bottles of ensure daily. Denies any constipation or diarrhea or dysphagia. Nausea is not related to food intake. Current use of omeprazole '40mg'$  BID.  Maintain zoloft dose I think sleep disturbance and GI symptoms is related to oral prednisone. Advised to take meds with food and use zofran prn for nausea We discussed use of remeron and possible side effects. He agreed to start medication at hs. May need GI f/up if no improvement and continuous weight loss F/up in 127month

## 2022-09-02 NOTE — Progress Notes (Signed)
[facility] Counselor/Therapist Progress Note  Patient ID: Russell Brown, MRN: [id number],    Date: 09/02/2022  Time Spent: 2:00pm-2:55pm   55 minutes   Treatment Type: Individual Therapy  Reported Symptoms: stress, anxiety  Mental Status Exam: Appearance:  Casual     Behavior: Appropriate  Motor: Normal  Speech/Language:  Normal Rate  Affect: Appropriate  Mood: normal  Thought process: normal  Thought content:   WNL  Sensory/Perceptual disturbances:   WNL  Orientation: oriented to person, place, time/date, and situation  Attention: Good  Concentration: Good  Memory: WNL  Fund of knowledge:  Good  Insight:   Good  Judgment:  Good  Impulse Control: Good   Risk Assessment: Danger to Self:  No Self-injurious Behavior: No Danger to Others: No Duty to Warn:no Physical Aggression / Violence:No  Access to Firearms a concern: No  Gang Involvement:No   Subjective: Pt present for face-to-face individual therapy via video Webex.  Pt consents to telehealth video session due to COVID 19 pandemic. Location of pt: home Location of therapist: home office.     Pt talked about his health.  He is still on prednisone and has had trouble sleeping.  Addressed how frustrating this has been for pt. Pt talked about his relationship with his son Mikel.   Pt feels sorry for [facility] and gets his "buttons pushed" easily.  Addressed how Mikel impacts pt.  Worked on pt's guilt feelings and how healthy boundary setting is what is best for both him and Mikel.   Educated pt about enabling behavior.  Helped pt process his feelings and relationship dynamics. Pt talked about being a Research officer, trade union.  Worked on how to manage worrying.   Worked on self care strategies.  Provided supportive therapy.      Interventions: Cognitive Behavioral Therapy and Insight-Oriented  Diagnosis: F43.23  Plan of Care: Recommend ongoing therapy.  Pt participated in setting treatment goals.  He is having  trouble dealing with his best friend's suicide and his nephew's murder.   Pt wants help dealing with his son bc of the conflict with his wife over helping his son out financially.  Pt wants to improve coping skills.  Plan to meet every one to two weeks.    Treatment Plan (target date 08/04/2023) Client Abilities/Strengths  Pt is bright, engaging, and motivated for therapy.  Client Treatment Preferences  Individual therapy.  Client Statement of Needs  Improve copings skills and understand herself better. Improve self esteem.  Symptoms  Depressed or irritable mood. Excessive and/or unrealistic worry that is difficult to control occurring more days than not for at least 6 months about a number of events or activities. Hypervigilance (e.g., feeling constantly on edge, experiencing concentration difficulties, having trouble falling or staying asleep, exhibiting a general state of irritability). Low self-esteem. Problems Addressed  Unipolar Depression, Anxiety Goals 1. Alleviate depressive symptoms and return to previous level of effective functioning. 2. Appropriately grieve the loss in order to normalize mood and to return to previously adaptive level of functioning. Objective Learn and implement behavioral strategies to overcome depression. Target Date: 2023-08-04 Frequency: Biweekly  Progress: 10 Modality: individual  Related Interventions Assist the client in developing skills that increase the likelihood of deriving pleasure from behavioral activation (e.g., assertiveness skills, developing an exercise plan, less internal/more external focus, increased social involvement); reinforce success. Engage the client in "behavioral activation," increasing his/her activity level and contact with sources of reward, while identifying processes that inhibit activation. use behavioral techniques such as  instruction, rehearsal, role-playing, role reversal, as needed, to facilitate activity in the client's  daily life; reinforce success. 3. Develop healthy interpersonal relationships that lead to the alleviation and help prevent the relapse of depression. 4. Develop healthy thinking patterns and beliefs about self, others, and the world that lead to the alleviation and help prevent the relapse of depression. 5. Enhance ability to effectively cope with the full variety of life's worries and anxieties. 6. Learn and implement coping skills that result in a reduction of anxiety and worry, and improved daily functioning. Objective Learn and implement problem-solving strategies for realistically addressing worries. Target Date: 2023-08-04 Frequency: Biweekly  Progress: 10 Modality: individual  Related Interventions Assign the client a homework exercise in which he/she problem-solves a current problem (see Mastery of Your Anxiety and Worry: Workbook by Adora Fridge and Eliot Ford or Generalized Anxiety Disorder by Eather Colas, and Eliot Ford); review, reinforce success, and provide corrective feedback toward improvement. Teach the client problem-solving strategies involving specifically defining a problem, generating options for addressing it, evaluating the pros and cons of each option, selecting and implementing an optional action, and reevaluating and refining the action. Objective Learn and implement calming skills to reduce overall anxiety and manage anxiety symptoms. Target Date: 2023-08-04 Frequency: Biweekly  Progress: 10 Modality: individual  Related Interventions Assign the client to read about progressive muscle relaxation and other calming strategies in relevant books or treatment manuals (e.g., Progressive Relaxation Training by Leroy Kennedy; Mastery of Your Anxiety and Worry: Workbook by Beckie Busing). Assign the client homework each session in which he/she practices relaxation exercises daily, gradually applying them progressively from non-anxiety-provoking to anxiety-provoking situations;  review and reinforce success while providing corrective feedback toward improvement. Teach the client calming/relaxation skills (e.g., applied relaxation, progressive muscle relaxation, cue controlled relaxation; mindful breathing; biofeedback) and how to discriminate better between relaxation and tension; teach the client how to apply these skills to his/her daily life. 7. Recognize, accept, and cope with feelings of depression. 8. Reduce overall frequency, intensity, and duration of the anxiety so that daily functioning is not impaired. 9. Resolve the core conflict that is the source of anxiety. 10. Stabilize anxiety level while increasing ability to function on a daily basis. Diagnosis F43.23 Conditions For Discharge Achievement of treatment goals and objectives   Clint Bolder, LCSW

## 2022-09-02 NOTE — Progress Notes (Signed)
Established Patient Visit  Patient: Russell Brown   DOB: [birth year]   77 y.o. Male  MRN: [id number] Visit Date: 09/02/2022  Subjective:    Chief Complaint  Patient presents with   Depression   HPI GAD (generalized anxiety disorder) Improving mood with zoloft and weekly CBT sessions Reports difficulty falling asleep with prednisone taper dose for pulmonary symptoms. Onset in last 2weeks. He stop coffee consumption 3days ago and no improvement. Also reports acute of chronic nausea, decreased appetite and epigastric discomfort x 5days. 4lbs weight loss noted in last 87month States he eats 3smalls meals daily and drinks 2bottles of ensure daily. Denies any constipation or diarrhea or dysphagia. Nausea is not related to food intake. Current use of omeprazole '40mg'$  BID.  Maintain zoloft dose I think sleep disturbance and GI symptoms is related to oral prednisone. Advised to take meds with food and use zofran prn for nausea We discussed use of remeron and possible side effects. He agreed to start medication at hs. May need GI f/up if no improvement and continuous weight loss F/up in 140monthWt Readings from Last 3 Encounters:  09/02/22 168 lb 3.2 oz (76.3 kg)  08/20/22 173 lb 3.2 oz (78.6 kg)  08/06/22 172 lb 6.4 oz (78.2 kg)    Reviewed medical, surgical, and social history today  Medications: Outpatient Medications Prior to Visit  Medication Sig   albuterol (VENTOLIN HFA) 108 (90 Base) MCG/ACT inhaler Inhale 2 puffs into the lungs every 6 (six) hours as needed for wheezing or shortness of breath.   ANORO ELLIPTA 62.5-25 MCG/ACT AEPB USE 1 INHALATION BY MOUTH  DAILY   aspirin EC 81 MG tablet Take 81 mg by mouth daily.   atorvastatin (LIPITOR) 20 MG tablet Take 1 tablet (20 mg total) by mouth daily.   calcium citrate (CALCITRATE - DOSED IN MG ELEMENTAL CALCIUM) 950 (200 Ca) MG tablet Take 600 mg of elemental calcium by mouth daily.   cholecalciferol (VITAMIN D3) 25  MCG (1000 UNIT) tablet Take 800 Units by mouth daily.   dutasteride (AVODART) 0.5 MG capsule Take 1 capsule (0.5 mg total) by mouth daily.   fish oil-omega-3 fatty acids 1000 MG capsule Take 2 g by mouth daily.    levothyroxine (SYNTHROID) 75 MCG tablet TAKE 1 TABLET BY MOUTH DAILY  BEFORE BREAKFAST   lisinopril-hydrochlorothiazide (ZESTORETIC) 10-12.5 MG tablet TAKE 1 TABLET BY MOUTH DAILY   Multiple Vitamin (MULTIVITAMIN WITH MINERALS) TABS tablet Take 1 tablet by mouth daily.   omeprazole (PRILOSEC) 40 MG capsule Take 1 capsule (40 mg total) by mouth 2 (two) times daily.   ondansetron (ZOFRAN) 4 MG tablet Take 1 tablet (4 mg total) by mouth every 8 (eight) hours as needed for nausea or vomiting. (Patient taking differently: Take 150 mg by mouth every 8 (eight) hours as needed for nausea or vomiting.)   predniSONE (DELTASONE) 10 MG tablet Take 30 mg daily for 2 weeks, the 20 mg daily for 2 weeks , then 10 mg daily x 2 weeks , then 5 mg daily x 2 weeks then stop.   sertraline (ZOLOFT) 100 MG tablet Take 1.5 tablets (150 mg total) by mouth daily.   Zoster Vaccine Adjuvanted ([facility]    doxycycline (VIBRA-TABS) 100 MG tablet Take 1 tablet (100 mg total) by mouth 2 (two) times daily. (Patient not taking: Reported on 08/20/2022)   HYDROcodone bit-homatropine (HYCODAN) 5-1.5 MG/5ML syrup Take 5  mLs by mouth at bedtime as needed for cough. (Patient not taking: Reported on 09/02/2022)   nitroGLYCERIN (NITROSTAT) 0.4 MG SL tablet PLACE 1 TABLET UNDER THE TONGUE EVERY 5 MINUTES AS NEEDED   No facility-administered medications prior to visit.   Reviewed past medical and social history.   ROS per HPI above  Last CBC Lab Results  Component Value Date   WBC 7.4 08/05/2022   HGB 16.1 08/05/2022   HCT 47.1 08/05/2022   MCV 90.3 08/05/2022   MCH 30.0 04/26/2018   RDW 13.7 08/05/2022   PLT 210.0 [id number]   Last metabolic panel Lab Results  Component Value Date   GLUCOSE 92 08/05/2022    NA 138 08/05/2022   K 3.9 08/05/2022   CL 100 08/05/2022   CO2 26 08/05/2022   BUN 14 08/05/2022   CREATININE 1.00 08/05/2022   GFRNONAA >60 04/26/2018   CALCIUM 9.3 08/05/2022   PROT 6.5 08/05/2022   ALBUMIN 4.3 08/05/2022   BILITOT 1.0 08/05/2022   ALKPHOS 63 08/05/2022   AST 14 08/05/2022   ALT 19 08/05/2022   ANIONGAP 10 04/26/2018   Last hemoglobin A1c Lab Results  Component Value Date   HGBA1C 5.6 02/20/2018   Last thyroid functions Lab Results  Component Value Date   TSH 1.97 08/05/2022        Objective:  BP 100/72 (BP Location: Left Arm, Patient Position: Sitting, Cuff Size: Normal)   Pulse 72   Resp 16   Ht '6\' 1"'$  (1.854 m)   Wt 168 lb 3.2 oz (76.3 kg)   SpO2 97%   BMI 22.19 kg/m      Physical Exam Vitals reviewed.  Cardiovascular:     Rate and Rhythm: Normal rate and regular rhythm.     Pulses: Normal pulses.     Heart sounds: Normal heart sounds.  Pulmonary:     Effort: Pulmonary effort is normal.     Breath sounds: Normal breath sounds.  Abdominal:     General: Bowel sounds are normal. There is no distension.     Palpations: Abdomen is soft.     Tenderness: There is no abdominal tenderness. There is no guarding.  Neurological:     Mental Status: He is alert and oriented to person, place, and time.  Psychiatric:        Attention and Perception: Attention normal.        Mood and Affect: Mood is anxious.        Speech: Speech normal.        Behavior: Behavior is hyperactive. Behavior is cooperative.        Thought Content: Thought content normal.        Cognition and Memory: Cognition normal.        Judgment: Judgment normal.     No results found for any visits on 09/02/22.    Assessment & Plan:    Problem List Items Addressed This Visit       Other   Drug-induced insomnia (HCC)   Relevant Medications   mirtazapine (REMERON) 7.5 MG tablet   GAD (generalized anxiety disorder) - Primary    Improving mood with zoloft and weekly CBT  sessions Reports difficulty falling asleep with prednisone taper dose for pulmonary symptoms. Onset in last 2weeks. He stop coffee consumption 3days ago and no improvement. Also reports acute of chronic nausea, decreased appetite and epigastric discomfort x 5days. 4lbs weight loss noted in last 7month States he eats 3smalls meals daily and drinks 2bottles  of ensure daily. Denies any constipation or diarrhea or dysphagia. Nausea is not related to food intake. Current use of omeprazole '40mg'$  BID.  Maintain zoloft dose I think sleep disturbance and GI symptoms is related to oral prednisone. Advised to take meds with food and use zofran prn for nausea We discussed use of remeron and possible side effects. He agreed to start medication at hs. May need GI f/up if no improvement and continuous weight loss F/up in 34month     Relevant Medications   mirtazapine (REMERON) 7.5 MG tablet   Other Visit Diagnoses     Drug side effects       Relevant Orders   POCT Glucose (CBG)      Return in about 4 weeks (around 09/30/2022) for depression and anxiety, weight loss.     CWilfred Lacy NP

## 2022-09-10 ENCOUNTER — Ambulatory Visit (INDEPENDENT_AMBULATORY_CARE_PROVIDER_SITE_OTHER): Payer: Medicare Other | Admitting: Psychology

## 2022-09-10 DIAGNOSIS — F4323 Adjustment disorder with mixed anxiety and depressed mood: Secondary | ICD-10-CM | POA: Diagnosis not present

## 2022-09-10 NOTE — Progress Notes (Signed)
[facility] Counselor/Therapist Progress Note  Patient ID: AVEDIS CALLO, MRN: [id number],    Date: 09/10/2022  Time Spent: 9:00am-9:55am  55 minutes   Treatment Type: Individual Therapy  Reported Symptoms: stress  Mental Status Exam: Appearance:  Casual     Behavior: Appropriate  Motor: Normal  Speech/Language:  Normal Rate  Affect: Appropriate  Mood: normal  Thought process: normal  Thought content:   WNL  Sensory/Perceptual disturbances:   WNL  Orientation: oriented to person, place, time/date, and situation  Attention: Good  Concentration: Good  Memory: WNL  Fund of knowledge:  Good  Insight:   Good  Judgment:  Good  Impulse Control: Good   Risk Assessment: Danger to Self:  No Self-injurious Behavior: No Danger to Others: No Duty to Warn:no Physical Aggression / Violence:No  Access to Firearms a concern: No  Gang Involvement:No   Subjective: Pt present for face-to-face individual therapy via video Webex.  Pt consents to telehealth video session due to COVID 19 pandemic. Location of pt: home Location of therapist: home office.     Pt talked about his health.  He is starting to feel better.   He is sleeping better.   Pt states he feels much better regarding anxiety bc he has put into place the coping strategies that we worked on in therapy. Pt talked about his nephew Dale's murder.   The murder trial is about to start and pt will attend the trial.  Pt feels a lot of stress about anticipating the murder trial.   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  He is worried about how the process will proceed.  Helped pt process his thoughts and feelings.  Worked on self care strategies.  Provided supportive therapy.      Interventions: Cognitive Behavioral Therapy and Insight-Oriented  Diagnosis: F43.23  Plan of Care: Recommend ongoing therapy.  Pt participated in setting treatment goals.  He is having  trouble dealing with his best friend's suicide and his nephew's murder.   Pt wants help dealing with his son bc of the conflict with his wife over helping his son out financially.  Pt wants to improve coping skills.  Plan to meet every one to two weeks.    Treatment Plan (target date 08/04/2023) Client Abilities/Strengths  Pt is bright, engaging, and motivated for therapy.  Client Treatment Preferences  Individual therapy.  Client Statement of Needs  Improve copings skills and understand herself better. Improve self esteem.  Symptoms  Depressed or irritable mood. Excessive and/or unrealistic worry that is difficult to control occurring more days than not for at least 6 months about a number of events or activities. Hypervigilance (e.g., feeling constantly on edge, experiencing concentration difficulties, having trouble falling or staying asleep, exhibiting a general state of irritability). Low self-esteem. Problems Addressed  Unipolar Depression, Anxiety Goals 1. Alleviate depressive symptoms and return to previous level of effective functioning. 2. Appropriately grieve the loss in order to normalize mood and to return to previously adaptive level of functioning. Objective Learn and implement behavioral strategies to overcome depression. Target Date: 2023-08-04 Frequency: Biweekly  Progress: 10 Modality: individual  Related Interventions Assist the client in developing skills that increase the likelihood of deriving pleasure from behavioral activation (e.g., assertiveness skills, developing an exercise plan, less internal/more external focus, increased social involvement); reinforce success. Engage the client in "behavioral activation," increasing his/her activity level and contact with sources of reward, while identifying processes  that inhibit activation. use behavioral techniques such as instruction, rehearsal, role-playing, role reversal, as needed, to facilitate activity in the client's  daily life; reinforce success. 3. Develop healthy interpersonal relationships that lead to the alleviation and help prevent the relapse of depression. 4. Develop healthy thinking patterns and beliefs about self, others, and the world that lead to the alleviation and help prevent the relapse of depression. 5. Enhance ability to effectively cope with the full variety of life's worries and anxieties. 6. Learn and implement coping skills that result in a reduction of anxiety and worry, and improved daily functioning. Objective Learn and implement problem-solving strategies for realistically addressing worries. Target Date: 2023-08-04 Frequency: Biweekly  Progress: 10 Modality: individual  Related Interventions Assign the client a homework exercise in which he/she problem-solves a current problem (see Mastery of Your Anxiety and Worry: Workbook by Adora Fridge and Eliot Ford or Generalized Anxiety Disorder by Eather Colas, and Eliot Ford); review, reinforce success, and provide corrective feedback toward improvement. Teach the client problem-solving strategies involving specifically defining a problem, generating options for addressing it, evaluating the pros and cons of each option, selecting and implementing an optional action, and reevaluating and refining the action. Objective Learn and implement calming skills to reduce overall anxiety and manage anxiety symptoms. Target Date: 2023-08-04 Frequency: Biweekly  Progress: 10 Modality: individual  Related Interventions Assign the client to read about progressive muscle relaxation and other calming strategies in relevant books or treatment manuals (e.g., Progressive Relaxation Training by Leroy Kennedy; Mastery of Your Anxiety and Worry: Workbook by Beckie Busing). Assign the client homework each session in which he/she practices relaxation exercises daily, gradually applying them progressively from non-anxiety-provoking to anxiety-provoking situations;  review and reinforce success while providing corrective feedback toward improvement. Teach the client calming/relaxation skills (e.g., applied relaxation, progressive muscle relaxation, cue controlled relaxation; mindful breathing; biofeedback) and how to discriminate better between relaxation and tension; teach the client how to apply these skills to his/her daily life. 7. Recognize, accept, and cope with feelings of depression. 8. Reduce overall frequency, intensity, and duration of the anxiety so that daily functioning is not impaired. 9. Resolve the core conflict that is the source of anxiety. 10. Stabilize anxiety level while increasing ability to function on a daily basis. Diagnosis F43.23 Conditions For Discharge Achievement of treatment goals and objectives   Clint Bolder, LCSW

## 2022-09-17 ENCOUNTER — Ambulatory Visit: Payer: Medicare Other | Admitting: Psychology

## 2022-09-24 ENCOUNTER — Ambulatory Visit: Payer: Medicare Other | Admitting: Psychology

## 2022-09-24 ENCOUNTER — Other Ambulatory Visit: Payer: Self-pay | Admitting: Nurse Practitioner

## 2022-09-24 DIAGNOSIS — F19982 Other psychoactive substance use, unspecified with psychoactive substance-induced sleep disorder: Secondary | ICD-10-CM

## 2022-09-24 DIAGNOSIS — F411 Generalized anxiety disorder: Secondary | ICD-10-CM

## 2022-09-29 ENCOUNTER — Encounter: Payer: Self-pay | Admitting: Primary Care

## 2022-09-29 ENCOUNTER — Ambulatory Visit (INDEPENDENT_AMBULATORY_CARE_PROVIDER_SITE_OTHER): Payer: Medicare Other | Admitting: Pulmonary Disease

## 2022-09-29 ENCOUNTER — Ambulatory Visit (INDEPENDENT_AMBULATORY_CARE_PROVIDER_SITE_OTHER): Payer: Medicare Other | Admitting: Primary Care

## 2022-09-29 VITALS — BP 116/58 | HR 79 | Temp 98.2°F | Ht 73.0 in | Wt 173.8 lb

## 2022-09-29 DIAGNOSIS — J4489 Other specified chronic obstructive pulmonary disease: Secondary | ICD-10-CM | POA: Diagnosis not present

## 2022-09-29 DIAGNOSIS — J849 Interstitial pulmonary disease, unspecified: Secondary | ICD-10-CM

## 2022-09-29 DIAGNOSIS — R0602 Shortness of breath: Secondary | ICD-10-CM

## 2022-09-29 LAB — PULMONARY FUNCTION TEST
DL/VA % pred: 54 %
DL/VA: 2.12 ml/min/mmHg/L
DLCO cor % pred: 41 %
DLCO cor: 11.32 ml/min/mmHg
DLCO unc % pred: 43 %
DLCO unc: 11.77 ml/min/mmHg
FEF 25-75 Post: 0.93 L/sec
FEF 25-75 Pre: 0.74 L/sec
FEF2575-%Change-Post: 25 %
FEF2575-%Pred-Post: 37 %
FEF2575-%Pred-Pre: 30 %
FEV1-%Change-Post: 11 %
FEV1-%Pred-Post: 55 %
FEV1-%Pred-Pre: 50 %
FEV1-Post: 1.91 L
FEV1-Pre: 1.71 L
FEV1FVC-%Change-Post: 6 %
FEV1FVC-%Pred-Pre: 67 %
FEV6-%Change-Post: 6 %
FEV6-%Pred-Post: 80 %
FEV6-%Pred-Pre: 75 %
FEV6-Post: 3.58 L
FEV6-Pre: 3.35 L
FEV6FVC-%Change-Post: 1 %
FEV6FVC-%Pred-Post: 102 %
FEV6FVC-%Pred-Pre: 101 %
FVC-%Change-Post: 5 %
FVC-%Pred-Post: 78 %
FVC-%Pred-Pre: 74 %
FVC-Post: 3.7 L
FVC-Pre: 3.51 L
Post FEV1/FVC ratio: 52 %
Post FEV6/FVC ratio: 97 %
Pre FEV1/FVC ratio: 49 %
Pre FEV6/FVC Ratio: 95 %
RV % pred: 124 %
RV: 3.42 L
TLC % pred: 87 %
TLC: 6.73 L

## 2022-09-29 MED ORDER — TRELEGY ELLIPTA 100-62.5-25 MCG/ACT IN AEPB: 1.0000 | INHALATION_SPRAY | Freq: Every day | RESPIRATORY_TRACT | 0 refills | Status: DC

## 2022-09-29 NOTE — Patient Instructions (Signed)
Full PFT Performed Today  

## 2022-09-29 NOTE — Progress Notes (Signed)
Full PFT Performed Today  

## 2022-09-29 NOTE — Progress Notes (Signed)
@Patient  ID: Russell Brown, male    DOB: Nov 28, 1945, 77 y.o.   MRN: [id number]  Chief Complaint  Patient presents with   Follow-up    Review PFT today.  Some improvement from last OV on 08/20/2022.    Referring provider: Anne Ng, NP  HPI:  77 year old male, former smoker.  Past medical history significant for COPD, asthma, GERD, hiatal hernia status post Nissen's fundoplication.  Patient is followed by Dr. Craige Cotta Dr. Isaiah Serge.  He was originally referred due to CT finding showing pulmonary fibrosis, unable to tolerate Ofev discontinued November 2023.  09/29/2022 Patient presents today for follow-up.  He feels his breathing has been worse over the last several months and his wife has noticed decline in his physical condition over the last year. He attended pulmonary rehab for 5-6 weeks but had to stop early d/t family crisis He has a treadmill at home and he likes to walk outside.    Allergies  Allergen Reactions   Dextromorphan Hydrochloride [Dextromethorphan]     Dizzy, nervous, mental clouding.    Codeine Anxiety    Reaction if taken for extended periods of time.    Immunization History  Administered Date(s) Administered   Fluad Quad(high Dose 65+) 03/16/2019   H1N1 05/28/2008   Influenza Whole 04/28/2007, 03/26/2008, 04/16/2009, 05/07/2010, 04/10/2011, 04/13/2012   Influenza, High Dose Seasonal PF 03/05/2014, 03/17/2015, 03/17/2018, 03/26/2019, 03/05/2022   Influenza, Seasonal, Injecte, Preservative Fre 03/06/2013   Influenza,inj,Quad PF,6+ Mos 03/04/2016   Influenza-Unspecified 03/22/2011, 03/11/2020, 03/27/2021   Moderna SARS-COV2 Booster Vaccination 05/05/2020   Moderna Sars-Covid-2 Vaccination 08/06/2019, 09/04/2019, 03/19/2022   Pneumococcal Conjugate-13 12/05/2014   Pneumococcal Polysaccharide-23 04/11/2008, 04/18/2013, 03/16/2019   Td 05/22/2009, 02/16/2011   Tdap 05/05/2022   Zoster Recombinat (Shingrix) 03/21/2017, 09/10/2017   Zoster, Live  05/22/2009    Past Medical History:  Diagnosis Date   Arthritis    osteo arthritis left wrist , 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   CAP (community acquired pneumonia) 03/03/2012   COPD (chronic obstructive pulmonary disease)    GERD (gastroesophageal reflux disease)    History of hiatal hernia    Hyperlipemia    Hypertension    Hypothyroidism    Pneumonia 2013; 2017   Preventative health care 12/06/2016   PTSD (post-traumatic stress disorder)    retired Emergency planning/management officer    Pulmonary fibrosis    Sleep apnea    mild - but does not wear a c-pap per pt   Thyroid disease    thyroid nodules - half of thyroid was removed per pt    Tobacco History: Social History   Tobacco Use  Smoking Status Former   Packs/day: 1.00   Years: 35.00   Additional pack years: 0.00   Total pack years: 35.00   Types: Cigarettes   Quit date: 08/18/1996   Years since quitting: 26.1   Passive exposure: Past  Smokeless Tobacco Never   Counseling given: Not Answered   Outpatient Medications Prior to Visit  Medication Sig Dispense Refill   albuterol (VENTOLIN HFA) 108 (90 Base) MCG/ACT inhaler Inhale 2 puffs into the lungs every 6 (six) hours as needed for wheezing or shortness of breath. 8 g 2   ANORO ELLIPTA 62.5-25 MCG/ACT AEPB USE 1 INHALATION BY MOUTH  DAILY 180 each 3   aspirin EC 81 MG tablet Take 81 mg by mouth daily.     atorvastatin (LIPITOR) 20 MG tablet Take 1 tablet (20  mg total) by mouth daily. 90 tablet 3   calcium citrate (CALCITRATE - DOSED IN MG ELEMENTAL CALCIUM) 950 (200 Ca) MG tablet Take 600 mg of elemental calcium by mouth daily.     cholecalciferol (VITAMIN D3) 25 MCG (1000 UNIT) tablet Take 800 Units by mouth daily.     dutasteride (AVODART) 0.5 MG capsule Take 1 capsule (0.5 mg total) by mouth daily. 90 capsule 3   fish oil-omega-3 fatty acids 1000 MG capsule Take 2 g by mouth daily.      lisinopril-hydrochlorothiazide (ZESTORETIC)  10-12.5 MG tablet TAKE 1 TABLET BY MOUTH DAILY 90 tablet 3   Multiple Vitamin (MULTIVITAMIN WITH MINERALS) TABS tablet Take 1 tablet by mouth daily.     omeprazole (PRILOSEC) 40 MG capsule Take 1 capsule (40 mg total) by mouth 2 (two) times daily. 180 capsule 3   levothyroxine (SYNTHROID) 75 MCG tablet TAKE 1 TABLET BY MOUTH DAILY  BEFORE BREAKFAST 90 tablet 0   sertraline (ZOLOFT) 100 MG tablet Take 1.5 tablets (150 mg total) by mouth daily. 135 tablet 1   nitroGLYCERIN (NITROSTAT) 0.4 MG SL tablet PLACE 1 TABLET UNDER THE TONGUE EVERY 5 MINUTES AS NEEDED 25 tablet 3   ondansetron (ZOFRAN) 4 MG tablet Take 1 tablet (4 mg total) by mouth every 8 (eight) hours as needed for nausea or vomiting. 30 tablet 0   Zoster Vaccine Adjuvanted [facility]) injection      doxycycline (VIBRA-TABS) 100 MG tablet Take 1 tablet (100 mg total) by mouth 2 (two) times daily. (Patient not taking: Reported on 08/20/2022) 14 tablet 0   HYDROcodone bit-homatropine (HYCODAN) 5-1.5 MG/5ML syrup Take 5 mLs by mouth at bedtime as needed for cough. (Patient not taking: Reported on 09/02/2022) 240 mL 0   mirtazapine (REMERON) 7.5 MG tablet TAKE 1 TABLET BY MOUTH AT  BEDTIME (Patient not taking: Reported on 09/29/2022) 30 tablet 11   predniSONE (DELTASONE) 10 MG tablet Take 30 mg daily for 2 weeks, the 20 mg daily for 2 weeks , then 10 mg daily x 2 weeks , then 5 mg daily x 2 weeks then stop. (Patient not taking: Reported on 09/29/2022) 100 tablet 0   No facility-administered medications prior to visit.      Review of Systems  Review of Systems  Constitutional:  Positive for fatigue.  HENT: Negative.    Respiratory:  Positive for shortness of breath.   Cardiovascular: Negative.    Physical Exam  BP (!) 116/58 (BP Location: Left Arm, Patient Position: Sitting, Cuff Size: Large)   Pulse 79   Temp 98.2 F (36.8 C) (Oral)   Ht 6\' 1"  (1.854 m)   Wt 173 lb 12.8 oz (78.8 kg)   SpO2 97%   BMI 22.93 kg/m  Physical  Exam Constitutional:      General: He is not in acute distress.    Appearance: Normal appearance. He is not ill-appearing.  HENT:     Head: Normocephalic and atraumatic.  Cardiovascular:     Rate and Rhythm: Normal rate and regular rhythm.  Pulmonary:     Effort: Pulmonary effort is normal.     Breath sounds: Normal breath sounds.     Comments: Very faint scattered rales R>L base; otherwise clear  Skin:    General: Skin is warm and dry.  Neurological:     General: No focal deficit present.     Mental Status: He is alert and oriented to person, place, and time. Mental status is at baseline.  Psychiatric:  Mood and Affect: Mood normal.        Behavior: Behavior normal.        Thought Content: Thought content normal.        Judgment: Judgment normal.      Lab Results:  CBC    Component Value Date/Time   WBC 7.4 08/05/2022 0953   RBC 5.22 08/05/2022 0953   HGB 16.1 08/05/2022 0953   HCT 47.1 08/05/2022 0953   PLT 210.0 08/05/2022 0953   MCV 90.3 08/05/2022 0953   MCH 30.0 04/26/2018 0913   MCHC 34.2 08/05/2022 0953   RDW 13.7 08/05/2022 0953   LYMPHSABS 1.0 08/05/2022 0953   MONOABS 0.6 08/05/2022 0953   EOSABS 0.2 08/05/2022 0953   BASOSABS 0.0 08/05/2022 0953    BMET    Component Value Date/Time   NA 138 08/05/2022 0953   K 3.9 08/05/2022 0953   CL 100 08/05/2022 0953   CO2 26 08/05/2022 0953   GLUCOSE 92 08/05/2022 0953   GLUCOSE 98 04/22/2006 1057   BUN 14 08/05/2022 0953   CREATININE 1.00 08/05/2022 0953   CALCIUM 9.3 08/05/2022 0953   GFRNONAA >60 04/26/2018 0913   GFRAA >60 04/26/2018 0913    BNP No results found for: "BNP"  ProBNP    Component Value Date/Time   PROBNP 35.0 08/05/2022 0953    Imaging: No results found.   Assessment & Plan:   ILD (interstitial lung disease) (HCC) - Patient has noticed decline over the last several months and his breathing. He is intolerant to Ofev and Esbriet, stopped in November 2023. CT imaging in  February 2024 showed mild pulmonary fibrosis with UIP pattern, no progression since last CT imaging in November 2023. Along with severe emphysema. Pulmonary function testing today showed no significant change in FEV1 or diffusion capacity when compared to prior.    Dyspnea not felt to be related to progression/worsening of his underlying lung disease. Recommend patient have echocardiogram to assess for pulmonary artery hypertension, recommend he follow-up with Dr. Jens Somrenshaw after echocardiogram discussed possible need for right heart cath. We will also maximize his treatment for moderate COPD/asthma overlap and severe emphysema by escalating maintenance inhaler to Trelegy  Recommend considering clinical trials for newer drug options for IPF treatment. I will discuss with Dr. Isaiah SergeMannam and likely have [facility] team reach out to patient to discuss further.    COPD with asthma (HCC) - Patient has moderate obstructive lung disease with borderline bronchodilator response. Recommend escalate maintenance inhaler from Anoro to Trelegy due to his clinical symptoms.  Recommendations: Stop Anoro; Start Trelegy 100mcg - take one puffs daily in the morning (rinse mouth after use) Slowly increase activity level Monitor oxygen levels, notify office if <88%   Orders: Echocardiogram (ordered)   Follow-up: 3 months with Dr. Markus DaftMannam   Ciclaly Mulcahey W Tresa Jolley, NP 10/04/2022

## 2022-09-29 NOTE — Patient Instructions (Addendum)
Your pulmonary function testing today appears stable from previous, no significant loss of lung function or diffusion capacity. You do have moderate obstructive lung disease consistent with COPD, borderline bronchodilator response which can be seen with mixed disease such as asthma. Recommend escalating maintenance inhaler from Anoro to Trelegy  CT imaging in February of this year showed mild pulmonary fibrosis with UIP pattern, no progression since last CT imaging in November 2023.  Dyspnea symptoms not felt to be due to worsening pulmonary fibrosis.  You do have severe emphysema on imaging and moderate obstructive lung disease/asthma on pulmonary function testing.  This could be contributing to perceived shortness of breath.  We will change your maintenance inhaler. Pulmonary fibrosis does increase the risk for pulmonary hypertension, recommend getting echocardiogram to assess pulmonary artery pressure.  We may want to discuss getting a right heart cath to assess pulmonary artery pressure with Dr. Jens Som depending on echocardiogram results.    Since you are intolerant to Ofev and Esbriet, recommend we consider clinical trials for newer drug options for IPF treament- I will discuss with Dr. Isaiah Serge and likely have [facility] team reach out to you to discuss your qualifications   Recommendations: Stop Anoro; Start Trelegy - take one puffs daily in the morning (rinse mouth after use) Slowly increase activity level Monitor oxygen levels, notify office if <88%   Orders: Echocardiogram (ordered)   Follow-up: 3 months with Dr. Isaiah Serge

## 2022-09-30 ENCOUNTER — Ambulatory Visit (INDEPENDENT_AMBULATORY_CARE_PROVIDER_SITE_OTHER): Payer: Medicare Other | Admitting: Nurse Practitioner

## 2022-09-30 ENCOUNTER — Encounter: Payer: Self-pay | Admitting: Nurse Practitioner

## 2022-09-30 VITALS — BP 124/59 | HR 58 | Temp 98.4°F | Resp 16 | Ht 73.0 in | Wt 171.6 lb

## 2022-09-30 DIAGNOSIS — F4310 Post-traumatic stress disorder, unspecified: Secondary | ICD-10-CM

## 2022-09-30 DIAGNOSIS — F19982 Other psychoactive substance use, unspecified with psychoactive substance-induced sleep disorder: Secondary | ICD-10-CM

## 2022-09-30 DIAGNOSIS — F411 Generalized anxiety disorder: Secondary | ICD-10-CM

## 2022-09-30 DIAGNOSIS — E039 Hypothyroidism, unspecified: Secondary | ICD-10-CM

## 2022-09-30 MED ORDER — SERTRALINE HCL 100 MG PO TABS
150.0000 mg | ORAL_TABLET | Freq: Every day | ORAL | 3 refills | Status: DC
Start: 2022-09-30 — End: 2023-09-16

## 2022-09-30 MED ORDER — LEVOTHYROXINE SODIUM 75 MCG PO TABS: 75.0000 ug | ORAL_TABLET | Freq: Every day | ORAL | 1 refills | Status: DC

## 2022-09-30 NOTE — Patient Instructions (Signed)
Maintain current med doses 

## 2022-09-30 NOTE — Assessment & Plan Note (Signed)
Improved and stable Denies need for Remeron with completion of oral prednisone. Maintain zoloft dose and CBT sessions

## 2022-09-30 NOTE — Progress Notes (Signed)
Established Patient Visit  Patient: Russell Brown   DOB: 06-28-1945   77 y.o. Male  MRN: [id number] Visit Date: 09/30/2022  Subjective:    Chief Complaint  Patient presents with   Depression    Doing much better.  Since finishing the prednisone he has been able to sleep.  He stopped the Remeron.    Depression        GAD (generalized anxiety disorder) Improved and stable Denies need for Remeron with completion of oral prednisone. Maintain zoloft dose and CBT sessions  Reviewed medical, surgical, and social history today  Medications: Outpatient Medications Prior to Visit  Medication Sig   albuterol (VENTOLIN HFA) 108 (90 Base) MCG/ACT inhaler Inhale 2 puffs into the lungs every 6 (six) hours as needed for wheezing or shortness of breath.   ANORO ELLIPTA 62.5-25 MCG/ACT AEPB USE 1 INHALATION BY MOUTH  DAILY   aspirin EC 81 MG tablet Take 81 mg by mouth daily.   atorvastatin (LIPITOR) 20 MG tablet Take 1 tablet (20 mg total) by mouth daily.   calcium citrate (CALCITRATE - DOSED IN MG ELEMENTAL CALCIUM) 950 (200 Ca) MG tablet Take 600 mg of elemental calcium by mouth daily.   cholecalciferol (VITAMIN D3) 25 MCG (1000 UNIT) tablet Take 800 Units by mouth daily.   dutasteride (AVODART) 0.5 MG capsule Take 1 capsule (0.5 mg total) by mouth daily.   fish oil-omega-3 fatty acids 1000 MG capsule Take 2 g by mouth daily.    Fluticasone-Umeclidin-Vilant (TRELEGY ELLIPTA) 100-62.5-25 MCG/ACT AEPB Inhale 1 puff into the lungs daily.   lisinopril-hydrochlorothiazide (ZESTORETIC) 10-12.5 MG tablet TAKE 1 TABLET BY MOUTH DAILY   Multiple Vitamin (MULTIVITAMIN WITH MINERALS) TABS tablet Take 1 tablet by mouth daily.   omeprazole (PRILOSEC) 40 MG capsule Take 1 capsule (40 mg total) by mouth 2 (two) times daily.   ondansetron (ZOFRAN) 4 MG tablet Take 1 tablet (4 mg total) by mouth every 8 (eight) hours as needed for nausea or vomiting.   Zoster Vaccine Adjuvanted [facility])  injection    [DISCONTINUED] levothyroxine (SYNTHROID) 75 MCG tablet TAKE 1 TABLET BY MOUTH DAILY  BEFORE BREAKFAST   [DISCONTINUED] sertraline (ZOLOFT) 100 MG tablet Take 1.5 tablets (150 mg total) by mouth daily.   nitroGLYCERIN (NITROSTAT) 0.4 MG SL tablet PLACE 1 TABLET UNDER THE TONGUE EVERY 5 MINUTES AS NEEDED   [DISCONTINUED] doxycycline (VIBRA-TABS) 100 MG tablet Take 1 tablet (100 mg total) by mouth 2 (two) times daily. (Patient not taking: Reported on 08/20/2022)   [DISCONTINUED] HYDROcodone bit-homatropine (HYCODAN) 5-1.5 MG/5ML syrup Take 5 mLs by mouth at bedtime as needed for cough. (Patient not taking: Reported on 09/02/2022)   [DISCONTINUED] mirtazapine (REMERON) 7.5 MG tablet TAKE 1 TABLET BY MOUTH AT  BEDTIME (Patient not taking: Reported on 09/29/2022)   [DISCONTINUED] predniSONE (DELTASONE) 10 MG tablet Take 30 mg daily for 2 weeks, the 20 mg daily for 2 weeks , then 10 mg daily x 2 weeks , then 5 mg daily x 2 weeks then stop. (Patient not taking: Reported on 09/29/2022)   No facility-administered medications prior to visit.   Reviewed past medical and social history.   ROS per HPI above      Objective:  BP (!) 124/59 (BP Location: Left Arm, Patient Position: Sitting, Cuff Size: Large)   Pulse (!) 58   Temp 98.4 F (36.9 C) (Temporal)   Resp 16   Ht 6'  1" (1.854 m)   Wt 171 lb 9.6 oz (77.8 kg)   SpO2 97%   BMI 22.64 kg/m      Physical Exam Vitals reviewed.  Cardiovascular:     Rate and Rhythm: Normal rate.     Pulses: Normal pulses.  Pulmonary:     Effort: Pulmonary effort is normal.  Neurological:     Mental Status: He is alert and oriented to person, place, and time.  Psychiatric:        Mood and Affect: Mood normal.        Behavior: Behavior normal.        Thought Content: Thought content normal.     No results found for any visits on 09/30/22.    Assessment & Plan:    Problem List Items Addressed This Visit       Endocrine   Hypothyroidism    Relevant Medications   levothyroxine (SYNTHROID) 75 MCG tablet     Other   Drug-induced insomnia   GAD (generalized anxiety disorder) - Primary    Improved and stable Denies need for Remeron with completion of oral prednisone. Maintain zoloft dose and CBT sessions      Relevant Medications   sertraline (ZOLOFT) 100 MG tablet   PTSD (post-traumatic stress disorder)   Relevant Medications   sertraline (ZOLOFT) 100 MG tablet   Return in about 6 months (around 04/01/2023) for HTN, depression and anxiety, Hypothyroidism, hyperlipidemia (fasting).     Alysia Penna, NP

## 2022-10-04 ENCOUNTER — Telehealth: Payer: Self-pay | Admitting: Primary Care

## 2022-10-04 NOTE — Telephone Encounter (Signed)
Dr. Isaiah Serge this is a patient of yours with IPF, he reports decline in dyspnea symptoms over the last year. He is intolerant to Ofev and Esbriet, stopped in November 2023. CT imaging in February 2024 showed mild pulmonary fibrosis with UIP pattern, no progression since last CT imaging in November 2023. Pulmonary function testing today showed no significant change in FEV1 or diffusion capacity when compared to prior.    Are you okay with patient being referred to [facility] for consideration of clinical trials for the treatment of his IPF?

## 2022-10-04 NOTE — Assessment & Plan Note (Addendum)
-   Patient has moderate obstructive lung disease with borderline bronchodilator response. Recommend escalate maintenance inhaler from Anoro to Trelegy due to his clinical symptoms.

## 2022-10-04 NOTE — Assessment & Plan Note (Addendum)
-   Patient has noticed decline over the last several months and his breathing. He is intolerant to Ofev and Esbriet, stopped in November 2023. CT imaging in February 2024 showed mild pulmonary fibrosis with UIP pattern, no progression since last CT imaging in November 2023. Along with severe emphysema. Pulmonary function testing today showed no significant change in FEV1 or diffusion capacity when compared to prior.    Dyspnea not felt to be related to progression/worsening of his underlying lung disease. Recommend patient have echocardiogram to assess for pulmonary artery hypertension, recommend he follow-up with Dr. Jens Som after echocardiogram discussed possible need for right heart cath. We will also maximize his treatment for moderate COPD/asthma overlap and severe emphysema by escalating maintenance inhaler to Trelegy  Recommend considering clinical trials for newer drug options for IPF treatment. I will discuss with Dr. Isaiah Serge and likely have [facility] team reach out to patient to discuss further.

## 2022-10-08 NOTE — Telephone Encounter (Signed)
Yes. Ok for clinical trial referral

## 2022-10-08 NOTE — Telephone Encounter (Signed)
Patient is interested in clinical trials for IPF, can you contact patient?

## 2022-10-14 NOTE — Progress Notes (Signed)
HPI:FU CAD. Patient admitted with chest pain September 2019. Cardiac catheterization September 2019 showed 25% right coronary artery and 50 to 55% ejection fraction. Abd ultrasound 10/20 showed no aneurysm.  Echocardiogram February 2022 showed normal LV function, grade 1 diastolic dysfunction, trace aortic insufficiency.  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  Since last seen he does note increased dyspnea on exertion.  No orthopnea, PND, pedal edema or syncope.  Occasional brief chest pressure unchanged.  He was seen by pulmonary and they question whether all of his dyspnea is pulmonary related.  There was a question of whether he would require right heart catheterization and an echocardiogram has been ordered.  Current Outpatient Medications  Medication Sig Dispense Refill   albuterol (VENTOLIN HFA) 108 (90 Base) MCG/ACT inhaler Inhale 2 puffs into the lungs every 6 (six) hours as needed for wheezing or shortness of breath. 8 g 2   ANORO ELLIPTA 62.5-25 MCG/ACT AEPB USE 1 INHALATION BY MOUTH  DAILY 180 each 3   aspirin EC 81 MG tablet Take 81 mg by mouth daily.     atorvastatin (LIPITOR) 20 MG tablet Take 1 tablet (20 mg total) by mouth daily. 90 tablet 3   calcium citrate (CALCITRATE - DOSED IN MG ELEMENTAL CALCIUM) 950 (200 Ca) MG tablet Take 600 mg of elemental calcium by mouth daily.     cholecalciferol (VITAMIN D3) 25 MCG (1000 UNIT) tablet Take 800 Units by mouth daily.     dutasteride (AVODART) 0.5 MG capsule Take 1 capsule (0.5 mg total) by mouth daily. 90 capsule 3   fish oil-omega-3 fatty acids 1000 MG capsule Take 2 g by mouth daily.      Fluticasone-Umeclidin-Vilant (TRELEGY ELLIPTA) 100-62.5-25 MCG/ACT AEPB Inhale 1 puff into the lungs daily. 14 each 0   levothyroxine (SYNTHROID) 75 MCG tablet Take 1 tablet (75 mcg total) by mouth daily  before breakfast. 90 tablet 1   lisinopril-hydrochlorothiazide (ZESTORETIC) 10-12.5 MG tablet TAKE 1 TABLET BY MOUTH DAILY 90 tablet 3   Multiple Vitamin (MULTIVITAMIN WITH MINERALS) TABS tablet Take 1 tablet by mouth daily.     omeprazole (PRILOSEC) 40 MG capsule Take 1 capsule (40 mg total) by mouth 2 (two) times daily. 180 capsule 3   ondansetron (ZOFRAN) 4 MG tablet Take 1 tablet (4 mg total) by mouth every 8 (eight) hours as needed for nausea or vomiting. 30 tablet 0   sertraline (ZOLOFT) 100 MG tablet Take 1.5 tablets (150 mg total) by mouth daily. 135 tablet 3   Zoster Vaccine Adjuvanted (SHINGRIX) injection      nitroGLYCERIN (NITROSTAT) 0.4 MG SL tablet PLACE 1 TABLET UNDER THE TONGUE EVERY 5 MINUTES AS NEEDED 25 tablet 3   No current facility-administered medications for this visit.     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    Pulmonary fibrosis [facility])    Sleep apnea  mild - but does not wear a c-pap per pt   Thyroid disease    thyroid nodules - half of thyroid was removed per pt    Past Surgical History:  Procedure Laterality Date   APPENDECTOMY     BACK SURGERY     CARDIAC CATHETERIZATION  1980s   "no blockage at that time" (02/20/2018)   CERVICAL SPINE SURGERY     "enlarged hole that nerves went thru"   FINGER AMPUTATION Left 2004   2nd and 3rd digits; "table saw injury"   FOOT FRACTURE SURGERY Left    "heel OR; put 2 steel rods in and took them out 6 wks later"   FRACTURE SURGERY     HERNIA  REPAIR Right 1965   LEFT HEART CATH AND CORONARY ANGIOGRAPHY N/A 02/21/2018   Procedure: LEFT HEART CATH AND CORONARY ANGIOGRAPHY;  Surgeon: Lennette Bihari, MD;  Location: [facility];  Service: Cardiovascular;  Laterality: N/A;   NASAL SEPTUM SURGERY  1986   NISSEN FUNDOPLICATION  1990s   "w/hernia repair"   THYROIDECTOMY, PARTIAL  2000s   WISDOM TOOTH EXTRACTION      Social History   Socioeconomic History   Marital status: Married    Spouse name: Not on file   Number of children: 4   Years of education: Not on file   Highest education level: Not on file  Occupational History   Occupation: retired Quarry manager    Employer: [employer]  Tobacco Use   Smoking status: Former    Packs/day: 1.00    Years: 35.00    Additional pack years: 0.00    Total pack years: 35.00    Types: Cigarettes    Quit date: 08/18/1996    Years since quitting: 26.1    Passive exposure: Past   Smokeless tobacco: Never  Vaping Use   Vaping Use: Never used  Substance and Sexual Activity   Alcohol use: Not Currently   Drug use: Never   Sexual activity: Not Currently    Partners: Female  Other Topics Concern   Not on file  Social History Narrative   Exercise- weights and walk on treadmill   Social Determinants of Health   Financial Resource Strain: Low Risk  (04/28/2021)   Overall Financial Resource Strain (CARDIA)    Difficulty of Paying Living Expenses: Not hard at all  Food Insecurity: No Food Insecurity (04/28/2021)   Hunger Vital Sign    Worried About Running Out of Food in the Last Year: Never true    Ran Out of Food in the Last Year: Never true  Transportation Needs: No Transportation Needs (04/28/2021)   PRAPARE - Administrator, Civil Service (Medical): No    Lack of Transportation (Non-Medical): No  Physical Activity: Insufficiently Active (04/28/2021)   Exercise Vital Sign    Days of Exercise per Week: 2 days    Minutes of Exercise per Session: 60 min   Stress: No Stress Concern Present (04/28/2021)   [employer] of Occupational Health - Occupational Stress Questionnaire    Feeling of Stress : Not at all  Social Connections: Moderately Integrated (04/28/2021)   Social Connection and Isolation Panel [NHANES]    Frequency of Communication with Friends and Family: Twice a week    Frequency of Social Gatherings with Friends and Family: Twice a week    Attends Religious Services: 1 to 4 times per year    Active Member of Clubs or Organizations: No  Attends Banker Meetings: Never    Marital Status: Married  Catering manager Violence: Not At Risk (04/28/2021)   Humiliation, Afraid, Rape, and Kick questionnaire    Fear of Current or Ex-Partner: No    Emotionally Abused: No    Physically Abused: No    Sexually Abused: No    Family History  Problem Relation Age of Onset   Parkinsonism Mother    Osteoporosis Mother    Pneumonia Mother        22   Cancer Father        prostate,  skin   Hyperlipidemia Father    Hypertension Father    Prostate cancer Father    Asthma Son    Osteoporosis Paternal Aunt    Alzheimer's disease Paternal Uncle    Osteoporosis Maternal Grandmother    Cancer Maternal Grandmother        lung   Heart disease Paternal Grandmother        MI   Heart disease Paternal Grandfather        MI   Colon cancer Neg Hx    Pancreatic cancer Neg Hx    Stomach cancer Neg Hx    Esophageal cancer Neg Hx    Liver cancer Neg Hx    Rectal cancer Neg Hx     ROS: no fevers or chills, productive cough, hemoptysis, dysphasia, odynophagia, melena, hematochezia, dysuria, hematuria, rash, seizure activity, orthopnea, PND, pedal edema, claudication. Remaining systems are negative.  Physical Exam: Well-developed well-nourished in no acute distress.  Skin is warm and dry.  HEENT is normal.  Neck is supple.  Chest with diminished breath sounds throughout Cardiovascular exam is regular rate and rhythm.   Abdominal exam nontender or distended. No masses palpated. Extremities show no edema. neuro grossly intact  ECG-normal sinus rhythm with occasional PAC, no ST changes.  Personally reviewed  A/P  1 coronary calcification-Continue medical therapy with aspirin and statin.  2 hypertension-blood pressure controlled.  Continue present medical regimen.  3 hyperlipidemia-continue statin.  4 COPD/pulmonary fibrosis-Per pulmonary.  5 dyspnea-I think this is likely secondary to progressive pulmonary disease based on his history.  An echocardiogram is pending to reassess LV function.  I will arrange a cardiac CTA to rule out progressive coronary disease.  If pulmonary indeed feels he requires right heart catheterization for medication management we can arrange this as well.  He will contact us if this is the case.  Olga Millers, MD

## 2022-10-20 ENCOUNTER — Ambulatory Visit: Payer: Medicare Other | Admitting: Pulmonary Disease

## 2022-10-20 ENCOUNTER — Ambulatory Visit (INDEPENDENT_AMBULATORY_CARE_PROVIDER_SITE_OTHER): Payer: Medicare Other | Admitting: Cardiology

## 2022-10-20 ENCOUNTER — Ambulatory Visit (HOSPITAL_BASED_OUTPATIENT_CLINIC_OR_DEPARTMENT_OTHER)
Admission: RE | Admit: 2022-10-20 | Discharge: 2022-10-20 | Disposition: A | Discharge status: Home / Self Care | Payer: Medicare Other | Source: Ambulatory Visit | Attending: Primary Care | Admitting: Primary Care

## 2022-10-20 ENCOUNTER — Encounter: Payer: Self-pay | Admitting: Cardiology

## 2022-10-20 VITALS — BP 126/68 | HR 81 | Ht 73.0 in | Wt 167.0 lb

## 2022-10-20 DIAGNOSIS — I10 Essential (primary) hypertension: Secondary | ICD-10-CM

## 2022-10-20 DIAGNOSIS — R072 Precordial pain: Secondary | ICD-10-CM | POA: Diagnosis not present

## 2022-10-20 DIAGNOSIS — R0602 Shortness of breath: Secondary | ICD-10-CM

## 2022-10-20 DIAGNOSIS — E785 Hyperlipidemia, unspecified: Secondary | ICD-10-CM | POA: Diagnosis not present

## 2022-10-20 DIAGNOSIS — I2510 Atherosclerotic heart disease of native coronary artery without angina pectoris: Secondary | ICD-10-CM

## 2022-10-20 DIAGNOSIS — J849 Interstitial pulmonary disease, unspecified: Secondary | ICD-10-CM

## 2022-10-20 LAB — ECHOCARDIOGRAM COMPLETE
Area-P 1/2: 3.08 cm2
P 1/2 time: 380 msec
S' Lateral: 2.7 cm

## 2022-10-20 MED ORDER — METOPROLOL TARTRATE 100 MG PO TABS: ORAL_TABLET | ORAL | 0 refills | Status: DC

## 2022-10-20 NOTE — Patient Instructions (Signed)
Testing/Procedures:     Your cardiac CT will be scheduled at  [facility] [address] [place], [place] [address] [phone]   If scheduled at [facility], please arrive at the [facility] and Children's Entrance (Entrance C2) of [facility] 30 minutes prior to test start time. You can use the FREE valet parking offered at entrance C (encouraged to control the heart rate for the test)  Proceed to the [facility] Radiology Department (first floor) to check-in and test prep.  All radiology patients and guests should use entrance C2 at [facility], accessed from [facility], even though the hospital's physical address listed is [address]      Please follow these instructions carefully (unless otherwise directed):  Hold all erectile dysfunction medications at least 3 days (72 hrs) prior to test. (Ie viagra, cialis, sildenafil, tadalafil, etc) We will administer nitroglycerin during this exam.   On the Night Before the Test: Be sure to Drink plenty of water. Do not consume any caffeinated/decaffeinated beverages or chocolate 12 hours prior to your test. Do not take any antihistamines 12 hours prior to your test.   On the Day of the Test: Drink plenty of water until 1 hour prior to the test. Do not eat any food 1 hour prior to test. You may take your regular medications prior to the test.  Take metoprolol (Lopressor) 100 MG two hours prior to test. If you take Furosemide/Hydrochlorothiazide/Spironolactone, please HOLD on the morning of the test. FEMALES- please wear underwire-free bra if available, avoid dresses & tight clothing       After the Test: Drink plenty of water. After receiving IV contrast, you may experience a mild flushed feeling. This is normal. On occasion, you may experience a mild rash up to 24 hours after the test. This is not dangerous. If this occurs, you can take Benadryl 25 mg and increase  your fluid intake. If you experience trouble breathing, this can be serious. If it is severe call 911 IMMEDIATELY. If it is mild, please call our office. If you take any of these medications: Glipizide/Metformin, Avandament, Glucavance, please do not take 48 hours after completing test unless otherwise instructed.  We will call to schedule your test 2-4 weeks out understanding that some insurance companies will need an authorization prior to the service being performed.   For non-scheduling related questions, please contact the cardiac imaging nurse navigator should you have any questions/concerns: Rockwell Alexandria, Cardiac Imaging Nurse Navigator Larey Brick, Cardiac Imaging Nurse Navigator [facility] Direct Office Dial: [phone]   For scheduling needs, including cancellations and rescheduling, please call [place], [phone].    Follow-Up: At [facility], you and your health needs are our priority.  As part of our continuing mission to provide you with exceptional heart care, we have created designated Provider Care Teams.  These Care Teams include your primary Cardiologist (physician) and Advanced Practice Providers (APPs -  Physician Assistants and Nurse Practitioners) who all work together to provide you with the care you need, when you need it.  We recommend signing up for the patient portal called "MyChart".  Sign up information is provided on this After Visit Summary.  MyChart is used to connect with patients for Virtual Visits (Telemedicine).  Patients are able to view lab/test results, encounter notes, upcoming appointments, etc.  Non-urgent messages can be sent to your provider as well.   To learn more about what  you can do with MyChart, go to [email or web address].    Your next appointment:   6 month(s)  Provider:   Olga Millers, MD

## 2022-10-28 ENCOUNTER — Other Ambulatory Visit (HOSPITAL_COMMUNITY): Payer: Medicare Other

## 2022-10-28 ENCOUNTER — Telehealth (HOSPITAL_COMMUNITY): Payer: Self-pay | Admitting: Emergency Medicine

## 2022-10-28 NOTE — Telephone Encounter (Signed)
Reaching out to patient to offer assistance regarding upcoming cardiac imaging study; pt verbalizes understanding of appt date/time, parking situation and where to check in, pre-test NPO status and medications ordered, and verified current allergies; name and call back number provided for further questions should they arise Mauricia Mertens RN Navigator Cardiac Imaging [facility] [phone] office [phone] cell 

## 2022-10-29 ENCOUNTER — Ambulatory Visit (HOSPITAL_COMMUNITY)
Admission: RE | Admit: 2022-10-29 | Discharge: 2022-10-29 | Disposition: A | Discharge status: Home / Self Care | Payer: Medicare Other | Source: Ambulatory Visit | Attending: Cardiology | Admitting: Cardiology

## 2022-10-29 DIAGNOSIS — R072 Precordial pain: Secondary | ICD-10-CM

## 2022-10-29 MED ORDER — NITROGLYCERIN 0.4 MG SL SUBL
SUBLINGUAL_TABLET | SUBLINGUAL | Status: AC
  Filled 2022-10-29: qty 2

## 2022-10-29 MED ORDER — IOHEXOL 350 MG/ML SOLN
100.0000 mL | Freq: Once | INTRAVENOUS | Status: AC | PRN
  Administered 2022-10-29: 100 mL via INTRAVENOUS

## 2022-10-29 MED ORDER — NITROGLYCERIN 0.4 MG SL SUBL
0.8000 mg | SUBLINGUAL_TABLET | SUBLINGUAL | Status: DC | PRN
  Administered 2022-10-29: 0.8 mg via SUBLINGUAL

## 2022-10-29 NOTE — Progress Notes (Signed)
Patient tolerated without distress 

## 2022-10-29 NOTE — Telephone Encounter (Signed)
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 Lookked at him for [facility] (Dr Judeth Horn study) that loooks at building a prospective validated tool for diagnosis of PAH but seems his BNP/ECHO are all normal thes year and his PFTs appear stable as well  We will continue to monitor  Thanks a lot for the referral      Latest Ref Rng & Units 09/29/2022    9:54 AM 08/12/2021   11:49 AM 05/28/2020   11:47 AM  PFT Results  FVC-Pre L 3.51  3.53  3.60   FVC-Predicted Pre % 74  74  75   FVC-Post L 3.70  3.68  3.98   FVC-Predicted Post % 78  77  83   Pre FEV1/FVC % % 49  53  53   Post FEV1/FCV % % 52  54  51   FEV1-Pre L 1.71  1.85  1.90   FEV1-Predicted Pre % 50  53  54   FEV1-Post L 1.91  2.00  2.04   DLCO uncorrected ml/min/mmHg 11.77  13.26  13.73   DLCO UNC% % 43  48  49   DLCO corrected ml/min/mmHg 11.32  13.26  13.73   DLCO COR %Predicted % 41  48  49   DLVA Predicted % 54  60  59   TLC L 6.73  5.72  6.97   TLC % Predicted % 87  74  91   RV % Predicted % 124  61  97

## 2022-11-22 ENCOUNTER — Encounter: Payer: Self-pay | Admitting: Cardiology

## 2022-11-22 DIAGNOSIS — R072 Precordial pain: Secondary | ICD-10-CM

## 2022-11-22 MED ORDER — NITROGLYCERIN 0.4 MG SL SUBL: SUBLINGUAL_TABLET | SUBLINGUAL | 3 refills | Status: DC

## 2022-11-22 MED ORDER — TRELEGY ELLIPTA 100-62.5-25 MCG/ACT IN AEPB: 1.0000 | INHALATION_SPRAY | Freq: Every day | RESPIRATORY_TRACT | 3 refills | Status: DC

## 2023-01-05 ENCOUNTER — Encounter: Payer: Self-pay | Admitting: Sports Medicine

## 2023-01-05 ENCOUNTER — Ambulatory Visit (INDEPENDENT_AMBULATORY_CARE_PROVIDER_SITE_OTHER): Payer: Medicare Other | Admitting: Sports Medicine

## 2023-01-05 VITALS — BP 120/87 | HR 84 | Ht 73.0 in | Wt 162.0 lb

## 2023-01-05 DIAGNOSIS — M549 Dorsalgia, unspecified: Secondary | ICD-10-CM | POA: Diagnosis not present

## 2023-01-05 NOTE — Progress Notes (Signed)
PCP: Anne Ng, NP  Subjective:   HPI: Patient is a 77 y.o. male here for mid/low back pain x 6 months.  Patient is concerned that his pain is related to his chronic lung disease  Currently asymptomatic, but intermittently experiences muscular pain in the middle of his back.  Pain is worse when he has been standing in 1 place for a long time.  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  Occasionally has low back pain the day after heavy lifting.  His pain improves with rest and sitting down.  His symptoms are temporary and resolve on their own or with rest.  About 1 month ago, his pain was particularly bad and he tried taking meloxicam for 3 days which resolved his pain.  Otherwise, he occasionally uses Tylenol.  Denies any radiating pain into his legs, denies recent traumatic injury, denies bowel/bladder changes, denies numbness/tingling.  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 8 g 2   ANORO ELLIPTA 62.5-25 MCG/ACT AEPB USE 1 INHALATION BY MOUTH   DAILY 180 each 3   aspirin EC 81 MG tablet Take 81 mg by mouth daily.     atorvastatin (LIPITOR) 20 MG tablet Take 1 tablet (20 mg total) by mouth daily. 90 tablet 3   calcium citrate (CALCITRATE - DOSED IN MG ELEMENTAL CALCIUM) 950 (200 Ca) MG tablet Take 600 mg of elemental calcium by mouth daily.     cholecalciferol (VITAMIN D3) 25 MCG (1000 UNIT) tablet Take 800 Units by mouth daily.     dutasteride (AVODART) 0.5 MG capsule Take 1 capsule (0.5 mg total) by mouth daily. 90 capsule 3   fish oil-omega-3 fatty acids 1000 MG capsule Take 2 g by mouth daily.      Fluticasone-Umeclidin-Vilant (TRELEGY ELLIPTA) 100-62.5-25 MCG/ACT AEPB Inhale 1 puff into the lungs daily. 14 each 0   Fluticasone-Umeclidin-Vilant (TRELEGY ELLIPTA) 100-62.5-25 MCG/ACT AEPB Inhale 1 puff into the lungs daily. 180 each 3   levothyroxine (SYNTHROID) 75 MCG tablet Take 1 tablet (75 mcg total) by mouth daily before breakfast. 90 tablet 1   lisinopril-hydrochlorothiazide (ZESTORETIC) 10-12.5 MG tablet TAKE 1 TABLET BY MOUTH DAILY 90 tablet 3   metoprolol tartrate (LOPRESSOR) 100 MG tablet TAKE 2 HOURS PRIOR TO CT SCAN 1 tablet 0   Multiple Vitamin (MULTIVITAMIN WITH MINERALS) TABS tablet Take 1 tablet by mouth daily.     nitroGLYCERIN (  NITROSTAT) 0.4 MG SL tablet PLACE 1 TABLET UNDER THE TONGUE EVERY 5 MINUTES AS NEEDED 25 tablet 3   omeprazole (PRILOSEC) 40 MG capsule Take 1 capsule (40 mg total) by mouth 2 (two) times daily. 180 capsule 3   ondansetron (ZOFRAN) 4 MG tablet Take 1 tablet (4 mg total) by mouth every 8 (eight) hours as needed for nausea or vomiting. 30 tablet 0   sertraline (ZOLOFT) 100 MG tablet Take 1.5 tablets (150 mg total) by mouth daily. 135 tablet 3   Zoster Vaccine Adjuvanted [facility]) injection      No current facility-administered medications on file prior to visit.    Past Surgical History:  Procedure Laterality Date   APPENDECTOMY     BACK SURGERY     CARDIAC CATHETERIZATION  1980s   "no  blockage at that time" (02/20/2018)   CERVICAL SPINE SURGERY     "enlarged hole that nerves went thru"   FINGER AMPUTATION Left 2004   2nd and 3rd digits; "table saw injury"   FOOT FRACTURE SURGERY Left    "heel OR; put 2 steel rods in and took them out 6 wks later"   FRACTURE SURGERY     HERNIA REPAIR Right 1965   LEFT HEART CATH AND CORONARY ANGIOGRAPHY N/A 02/21/2018   Procedure: LEFT HEART CATH AND CORONARY ANGIOGRAPHY;  Surgeon: Lennette Bihari, MD;  Location: [facility] INVASIVE [facility] LAB;  Service: Cardiovascular;  Laterality: N/A;   NASAL SEPTUM SURGERY  1986   NISSEN FUNDOPLICATION  1990s   "w/hernia repair"   THYROIDECTOMY, PARTIAL  2000s   WISDOM TOOTH EXTRACTION      Allergies  Allergen Reactions   Dextromorphan Hydrochloride [Dextromethorphan]     Dizzy, nervous, mental clouding.    Codeine Anxiety    Reaction if taken for extended periods of time.    BP 120/87 (BP Location: Left Arm, Patient Position: Sitting)   Pulse 84   Ht 6\' 1"  (1.854 m)   Wt 162 lb (73.5 kg)   SpO2 96%   BMI 21.37 kg/m       No data to display              No data to display              Objective:  Physical Exam:  Gen: NAD, comfortable in exam room  Back: Inspection: No gross deformity, ecchymosis, swelling Palpation: Nontender to palpation along vertebral spinous processes throughout cervical, thoracic, and lumbar spine.  Nontender to palpation throughout paraspinal muscles ROM: Full range of motion without pain Strength: 5/5 strength   Assessment & Plan:  1.  Mid/low back pain: Suspect possible underlying facet arthropathy/OA of his thoracic spine and/or paraspinal muscle strain.  Reassured by unremarkable exam findings and that his symptoms are temporary and resolve with rest.  Encouraged home exercises as well as continued conservative management with OTC NSAIDs/Tylenol as needed.  Follow-up as needed, consider physical therapy if no improvement.  Patient seen and evaluated with  the resident.  I agree with the above plan of care.  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  We reassured him that we do not believe this is secondary to his underlying lung disease.  We did discuss a referral for physical therapy but patient would like to defer that for now.  He will follow-up as needed.

## 2023-01-11 ENCOUNTER — Telehealth: Payer: Self-pay | Admitting: Nurse Practitioner

## 2023-01-11 NOTE — Telephone Encounter (Signed)
01/11/23 - Called Pt to sch AWV for the year, but pt declined.

## 2023-01-14 ENCOUNTER — Telehealth (HOSPITAL_COMMUNITY): Payer: Self-pay

## 2023-01-14 NOTE — Telephone Encounter (Signed)
Called pt to let them know about the Pulmonary Wellness program at [facility] No answer. VM left.

## 2023-03-15 ENCOUNTER — Encounter: Payer: Self-pay | Admitting: Pulmonary Disease

## 2023-03-17 ENCOUNTER — Other Ambulatory Visit: Payer: Self-pay | Admitting: Nurse Practitioner

## 2023-03-17 DIAGNOSIS — E039 Hypothyroidism, unspecified: Secondary | ICD-10-CM

## 2023-03-17 NOTE — Telephone Encounter (Signed)
Patient needs an appointment

## 2023-03-17 NOTE — Telephone Encounter (Signed)
Requesting: Levothyroxine Sodium 75 MCG Oral Tablet  Last Visit: 09/30/2022 Next Visit: Visit date not found Last Refill: 09/30/2022  Please Advise   Return in about 6 months (around 04/01/2023) for HTN, depression and anxiety, Hypothyroidism, hyperlipidemia (fasting). Per last note

## 2023-04-05 ENCOUNTER — Ambulatory Visit: Payer: Medicare Other | Admitting: Pulmonary Disease

## 2023-04-05 ENCOUNTER — Telehealth: Payer: Self-pay | Admitting: Nurse Practitioner

## 2023-04-05 NOTE — Telephone Encounter (Signed)
Please advise 

## 2023-04-05 NOTE — Telephone Encounter (Signed)
I spoke with pt's wife, she will relay message

## 2023-04-05 NOTE — Telephone Encounter (Signed)
Noted  

## 2023-04-05 NOTE — Telephone Encounter (Signed)
[facility] is needing an order to change manufacture of  levothyroxine (SYNTHROID) 75 MCG tablet [[id number] from [place] to [employer].  Phone: [phone]  Fax: [phone]

## 2023-04-07 NOTE — Telephone Encounter (Signed)
Also sent a mychart message concerning this issue.

## 2023-04-26 ENCOUNTER — Encounter: Payer: Self-pay | Admitting: Internal Medicine

## 2023-04-28 NOTE — Progress Notes (Signed)
HPI: FU CAD. Patient admitted with chest pain September 2019. Cardiac catheterization September 2019 showed 25% right coronary artery and 50 to 55% ejection fraction. Abd ultrasound 10/20 showed no aneurysm. Echocardiogram May 2024 showed normal LV function, grade 2 diastolic dysfunction, mild mitral regurgitation, mild aortic insufficiency.  Coronary CTA May 2024 showed severe COPD and fibrotic lung disease, calcium score 47 which is 22nd percentile and minimal plaque in the left main, LAD and RCA.  Since last seen patient does have dyspnea on exertion but no orthopnea, PND, pedal edema, chest pain or syncope.  Current Outpatient Medications  Medication Sig Dispense Refill   albuterol (VENTOLIN HFA) 108 (90 Base) MCG/ACT inhaler Inhale 2 puffs into the lungs every 6 (six) hours as needed for wheezing or shortness of breath. 8 g 2   aspirin EC 81 MG tablet Take 81 mg by mouth daily.     atorvastatin (LIPITOR) 20 MG tablet Take 1 tablet (20 mg total) by mouth daily. 90 tablet 3   calcium citrate (CALCITRATE - DOSED IN MG ELEMENTAL CALCIUM) 950 (200 Ca) MG tablet Take 600 mg of elemental calcium by mouth daily.     cholecalciferol (VITAMIN D3) 25 MCG (1000 UNIT) tablet Take 800 Units by mouth daily.     dutasteride (AVODART) 0.5 MG capsule Take 1 capsule (0.5 mg total) by mouth daily. 90 capsule 3   fish oil-omega-3 fatty acids 1000 MG capsule Take 2 g by mouth daily.      Fluticasone-Umeclidin-Vilant (TRELEGY ELLIPTA) 100-62.5-25 MCG/ACT AEPB Inhale 1 puff into the lungs daily. 14 each 0   levothyroxine (SYNTHROID) 75 MCG tablet TAKE 1 TABLET BY MOUTH DAILY  BEFORE BREAKFAST 60 tablet 0   lisinopril-hydrochlorothiazide (ZESTORETIC) 10-12.5 MG tablet TAKE 1 TABLET BY MOUTH DAILY 90 tablet 3   Multiple Vitamin (MULTIVITAMIN WITH MINERALS) TABS tablet Take 1 tablet by mouth daily.     nitroGLYCERIN (NITROSTAT) 0.4 MG SL tablet PLACE 1 TABLET UNDER THE TONGUE EVERY 5 MINUTES AS NEEDED 25 tablet 3    omeprazole (PRILOSEC) 40 MG capsule Take 1 capsule (40 mg total) by mouth 2 (two) times daily. 180 capsule 3   sertraline (ZOLOFT) 100 MG tablet Take 1.5 tablets (150 mg total) by mouth daily. 135 tablet 3   ANORO ELLIPTA 62.5-25 MCG/ACT AEPB USE 1 INHALATION BY MOUTH  DAILY 180 each 3   Fluticasone-Umeclidin-Vilant (TRELEGY ELLIPTA) 100-62.5-25 MCG/ACT AEPB Inhale 1 puff into the lungs daily. 180 each 3   metoprolol tartrate (LOPRESSOR) 100 MG tablet TAKE 2 HOURS PRIOR TO CT SCAN 1 tablet 0   ondansetron (ZOFRAN) 4 MG tablet Take 1 tablet (4 mg total) by mouth every 8 (eight) hours as needed for nausea or vomiting. 30 tablet 0   Zoster Vaccine Adjuvanted [facility]) injection      No current facility-administered medications for this visit.     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    Pulmonary fibrosis (HCC)  Sleep apnea    mild - but does not wear a c-pap per pt   Thyroid disease    thyroid nodules - half of thyroid was removed per pt    Past Surgical History:  Procedure Laterality Date   APPENDECTOMY     BACK SURGERY     CARDIAC CATHETERIZATION  1980s   "no blockage at that time" (02/20/2018)   CERVICAL SPINE SURGERY     "enlarged hole that nerves went thru"   FINGER AMPUTATION Left 2004   2nd and 3rd digits; "table saw injury"   FOOT FRACTURE SURGERY Left    "heel OR; put 2 steel rods in and took them out 6 wks later"   FRACTURE SURGERY     HERNIA REPAIR Right 1965   LEFT HEART CATH AND CORONARY  ANGIOGRAPHY N/A 02/21/2018   Procedure: LEFT HEART CATH AND CORONARY ANGIOGRAPHY;  Surgeon: Lennette Bihari, MD;  [facility]: [facility];  Service: Cardiovascular;  Laterality: N/A;   NASAL SEPTUM SURGERY  1986   NISSEN FUNDOPLICATION  1990s   "w/hernia repair"   THYROIDECTOMY, PARTIAL  2000s   WISDOM TOOTH EXTRACTION      Social History   Socioeconomic History   Marital status: Married    Spouse name: Not on file   Number of children: 4   Years of education: Not on file   Highest education level: Associate degree: occupational, Scientist, product/process development, or vocational program  Occupational History   Occupation: retired Art therapist: [employer]  Tobacco Use   Smoking status: Former    Current packs/day: 0.00    Average packs/day: 1 pack/day for 35.0 years (35.0 ttl pk-yrs)    Types: Cigarettes    Start date: 08/18/1961    Quit date: 08/18/1996    Years since quitting: 26.7    Passive exposure: Past   Smokeless tobacco: Never  Vaping Use   Vaping status: Never Used  Substance and Sexual Activity   Alcohol use: Not Currently   Drug use: Never   Sexual activity: Not Currently    Partners: Female  Other Topics Concern   Not on file  Social History Narrative   Exercise- weights and walk on treadmill   Social Determinants of Health   Financial Resource Strain: Low Risk  (05/09/2023)   Overall Financial Resource Strain (CARDIA)    Difficulty of Paying Living Expenses: Not very hard  Food Insecurity: No Food Insecurity (05/09/2023)   Hunger Vital Sign    Worried About Running Out of Food in the Last Year: Never true    Ran Out of Food in the Last Year: Never true  Transportation Needs: No Transportation Needs (05/09/2023)   PRAPARE - Administrator, Civil Service (Medical): No    Lack of Transportation (Non-Medical): No  Physical Activity: Insufficiently Active (05/09/2023)   Exercise Vital Sign    Days of Exercise per Week: 2 days    Minutes of  Exercise per Session: 40 min  Stress: No Stress Concern Present (05/09/2023)   [employer] of Occupational Health - Occupational Stress Questionnaire    Feeling of Stress : Not at all  Social Connections: Socially Integrated (05/09/2023)   Social Connection and Isolation Panel [NHANES]    Frequency of Communication with Friends and Family: Twice a week    Frequency of Social Gatherings with Friends and Family: Once a week    Attends Religious Services: More than 4 times per year    Active  Member of Clubs or Organizations: Yes    Attends Banker Meetings: 1 to 4 times per year    Marital Status: Married  Catering manager Violence: Not At Risk (04/28/2021)   Humiliation, Afraid, Rape, and Kick questionnaire    Fear of Current or Ex-Partner: No    Emotionally Abused: No    Physically Abused: No    Sexually Abused: No    Family History  Problem Relation Age of Onset   Parkinsonism Mother    Osteoporosis Mother    Pneumonia Mother        66   Cancer Father        prostate,  skin   Hyperlipidemia Father    Hypertension Father    Prostate cancer Father    Asthma Son    Osteoporosis Paternal Aunt    Alzheimer's disease Paternal Uncle    Osteoporosis Maternal Grandmother    Cancer Maternal Grandmother        lung   Heart disease Paternal Grandmother        MI   Heart disease Paternal Grandfather        MI   Colon cancer Neg Hx    Pancreatic cancer Neg Hx    Stomach cancer Neg Hx    Esophageal cancer Neg Hx    Liver cancer Neg Hx    Rectal cancer Neg Hx     ROS: no fevers or chills, productive cough, hemoptysis, dysphasia, odynophagia, melena, hematochezia, dysuria, hematuria, rash, seizure activity, orthopnea, PND, pedal edema, claudication. Remaining systems are negative.  Physical Exam: Well-developed well-nourished in no acute distress.  Skin is warm and dry.  HEENT is normal.  Neck is supple.  Chest is clear to auscultation with normal expansion.   Cardiovascular exam is regular rate and rhythm.  Abdominal exam nontender or distended. No masses palpated. Extremities show no edema. neuro grossly intact   A/P  1 coronary calcification-minimal disease noted on recent CTA.  Plan medical therapy with aspirin and statin.  2 hypertension-patient's blood pressure is controlled.  Continue present medical regimen.  3 hyperlipidemia-continue statin.  4 dyspnea-this is likely secondary to patient's progressive lung disease.  Coronary CTA showed minimal plaque.  5 COPD/pulmonary fibrosis-per pulmonary.  Olga Millers, MD

## 2023-05-11 ENCOUNTER — Encounter: Payer: Self-pay | Admitting: Cardiology

## 2023-05-11 ENCOUNTER — Ambulatory Visit: Payer: Medicare Other | Attending: Cardiology | Admitting: Cardiology

## 2023-05-11 VITALS — BP 136/80 | HR 71 | Ht 73.0 in | Wt 168.0 lb

## 2023-05-11 DIAGNOSIS — I2510 Atherosclerotic heart disease of native coronary artery without angina pectoris: Secondary | ICD-10-CM

## 2023-05-11 DIAGNOSIS — R0602 Shortness of breath: Secondary | ICD-10-CM | POA: Diagnosis not present

## 2023-05-11 DIAGNOSIS — I10 Essential (primary) hypertension: Secondary | ICD-10-CM

## 2023-05-11 DIAGNOSIS — E785 Hyperlipidemia, unspecified: Secondary | ICD-10-CM

## 2023-05-11 NOTE — Patient Instructions (Signed)
    Follow-Up: At [facility], you and your health needs are our priority.  As part of our continuing mission to provide you with exceptional heart care, we have created designated Provider Care Teams.  These Care Teams include your primary Cardiologist (physician) and Advanced Practice Providers (APPs -  Physician Assistants and Nurse Practitioners) who all work together to provide you with the care you need, when you need it.   Your next appointment:   12 month(s)  Provider:   Olga Millers, MD

## 2023-05-12 ENCOUNTER — Other Ambulatory Visit: Payer: Self-pay | Admitting: Nurse Practitioner

## 2023-05-12 DIAGNOSIS — K219 Gastro-esophageal reflux disease without esophagitis: Secondary | ICD-10-CM

## 2023-05-13 ENCOUNTER — Ambulatory Visit (INDEPENDENT_AMBULATORY_CARE_PROVIDER_SITE_OTHER): Payer: Medicare Other | Admitting: Nurse Practitioner

## 2023-05-13 ENCOUNTER — Encounter: Payer: Self-pay | Admitting: Nurse Practitioner

## 2023-05-13 VITALS — BP 136/70 | HR 70 | Temp 98.0°F | Resp 18 | Wt 168.8 lb

## 2023-05-13 DIAGNOSIS — N400 Enlarged prostate without lower urinary tract symptoms: Secondary | ICD-10-CM | POA: Diagnosis not present

## 2023-05-13 DIAGNOSIS — E038 Other specified hypothyroidism: Secondary | ICD-10-CM

## 2023-05-13 DIAGNOSIS — Z125 Encounter for screening for malignant neoplasm of prostate: Secondary | ICD-10-CM | POA: Diagnosis not present

## 2023-05-13 DIAGNOSIS — I10 Essential (primary) hypertension: Secondary | ICD-10-CM | POA: Diagnosis not present

## 2023-05-13 DIAGNOSIS — E7800 Pure hypercholesterolemia, unspecified: Secondary | ICD-10-CM | POA: Diagnosis not present

## 2023-05-13 LAB — COMPREHENSIVE METABOLIC PANEL
ALT: 18 U/L (ref 0–53)
AST: 18 U/L (ref 0–37)
Albumin: 4.5 g/dL (ref 3.5–5.2)
Alkaline Phosphatase: 81 U/L (ref 39–117)
BUN: 16 mg/dL (ref 6–23)
CO2: 30 meq/L (ref 19–32)
Calcium: 9.2 mg/dL (ref 8.4–10.5)
Chloride: 101 meq/L (ref 96–112)
Creatinine, Ser: 0.97 mg/dL (ref 0.40–1.50)
GFR: 75.35 mL/min (ref >60.00)
Glucose, Bld: 106 mg/dL — ABNORMAL HIGH (ref 70–99)
Potassium: 3.8 meq/L (ref 3.5–5.1)
Sodium: 139 meq/L (ref 135–145)
Total Bilirubin: 0.5 mg/dL (ref 0.2–1.2)
Total Protein: 6.7 g/dL (ref 6.0–8.3)

## 2023-05-13 LAB — LIPID PANEL
Cholesterol: 140 mg/dL (ref 0–200)
HDL: 48.7 mg/dL (ref >39.00)
LDL Cholesterol: 66 mg/dL (ref 0–99)
NonHDL: 91.27
Total CHOL/HDL Ratio: 3
Triglycerides: 126 mg/dL (ref 0.0–149.0)
VLDL: 25.2 mg/dL (ref 0.0–40.0)

## 2023-05-13 LAB — TSH: TSH: 1.47 u[IU]/mL (ref 0.35–5.50)

## 2023-05-13 LAB — T4, FREE: Free T4: 1.23 ng/dL (ref 0.60–1.60)

## 2023-05-13 LAB — PSA, MEDICARE: PSA: 1.47 ng/mL (ref 0.10–4.00)

## 2023-05-13 NOTE — Patient Instructions (Addendum)
Perform leg exercise with resistant band and core exercises daily. Go to lab

## 2023-05-13 NOTE — Assessment & Plan Note (Signed)
Repeat TSH and T4 Maintain levothyroxine dose Wt Readings from Last 3 Encounters:  05/13/23 168 lb 12.8 oz (76.6 kg)  05/11/23 168 lb (76.2 kg)  01/05/23 162 lb (73.5 kg)

## 2023-05-13 NOTE — Assessment & Plan Note (Signed)
Repeat lipid panel ?

## 2023-05-13 NOTE — Progress Notes (Signed)
Established Patient Visit  Patient: Russell Brown   DOB: May 27, 1946   77 y.o. Male  MRN: [id number] Visit Date: 05/13/2023  Subjective:    Chief Complaint  Patient presents with   OFFICE VISIT     6 month follow up    Hypertension   Depression   Hyperlipidemia    Anxiety and hypothyroidism    Hypertension  Depression       Hyperlipidemia   Essential hypertension BP at goal with lisinopril/hctz BP Readings from Last 3 Encounters:  05/13/23 136/70  05/11/23 136/80  01/05/23 120/87    Maintain med dose Repeat CMP  Hypothyroidism Repeat TSH and T4 Maintain levothyroxine dose Wt Readings from Last 3 Encounters:  05/13/23 168 lb 12.8 oz (76.6 kg)  05/11/23 168 lb (76.2 kg)  01/05/23 162 lb (73.5 kg)     Benign prostate hyperplasia Followed by [facility]: Dr. Mena Goes Current use of avodart No hematuria or dysuria or retention Repeat PSA  Hyperlipidemia Repeat lipid panel  Wt Readings from Last 3 Encounters:  05/13/23 168 lb 12.8 oz (76.6 kg)  05/11/23 168 lb (76.2 kg)  01/05/23 162 lb (73.5 kg)    Reviewed medical, surgical, and social history today  Medications: Outpatient Medications Prior to Visit  Medication Sig   albuterol (VENTOLIN HFA) 108 (90 Base) MCG/ACT inhaler Inhale 2 puffs into the lungs every 6 (six) hours as needed for wheezing or shortness of breath.   aspirin EC 81 MG tablet Take 81 mg by mouth daily.   atorvastatin (LIPITOR) 20 MG tablet Take 1 tablet (20 mg total) by mouth daily.   calcium citrate (CALCITRATE - DOSED IN MG ELEMENTAL CALCIUM) 950 (200 Ca) MG tablet Take 600 mg of elemental calcium by mouth daily.   cholecalciferol (VITAMIN D3) 25 MCG (1000 UNIT) tablet Take 800 Units by mouth daily.   dutasteride (AVODART) 0.5 MG capsule Take 1 capsule (0.5 mg total) by mouth daily.   fish oil-omega-3 fatty acids 1000 MG capsule Take 2 g by mouth daily.    Fluticasone-Umeclidin-Vilant (TRELEGY ELLIPTA)  100-62.5-25 MCG/ACT AEPB Inhale 1 puff into the lungs daily.   levothyroxine (SYNTHROID) 75 MCG tablet TAKE 1 TABLET BY MOUTH DAILY  BEFORE BREAKFAST   lisinopril-hydrochlorothiazide (ZESTORETIC) 10-12.5 MG tablet TAKE 1 TABLET BY MOUTH DAILY   Multiple Vitamin (MULTIVITAMIN WITH MINERALS) TABS tablet Take 1 tablet by mouth daily.   nitroGLYCERIN (NITROSTAT) 0.4 MG SL tablet PLACE 1 TABLET UNDER THE TONGUE EVERY 5 MINUTES AS NEEDED   omeprazole (PRILOSEC) 40 MG capsule TAKE 1 CAPSULE BY MOUTH TWICE  DAILY   sertraline (ZOLOFT) 100 MG tablet Take 1.5 tablets (150 mg total) by mouth daily.   metoprolol tartrate (LOPRESSOR) 100 MG tablet TAKE 2 HOURS PRIOR TO CT SCAN   [DISCONTINUED] ANORO ELLIPTA 62.5-25 MCG/ACT AEPB USE 1 INHALATION BY MOUTH  DAILY   [DISCONTINUED] Fluticasone-Umeclidin-Vilant (TRELEGY ELLIPTA) 100-62.5-25 MCG/ACT AEPB Inhale 1 puff into the lungs daily.   [DISCONTINUED] ondansetron (ZOFRAN) 4 MG tablet Take 1 tablet (4 mg total) by mouth every 8 (eight) hours as needed for nausea or vomiting.   [DISCONTINUED] Zoster Vaccine Adjuvanted [facility]) injection    No facility-administered medications prior to visit.   Reviewed past medical and social history.   ROS per HPI above      Objective:  BP 136/70 (BP Location: Right Arm, Patient Position: Sitting, Cuff Size: Normal) Comment: recheck reading  Pulse  70   Temp 98 F (36.7 C) (Temporal)   Resp 18   Wt 168 lb 12.8 oz (76.6 kg)   SpO2 95%   BMI 22.27 kg/m      Physical Exam Vitals and nursing note reviewed.  Cardiovascular:     Rate and Rhythm: Normal rate and regular rhythm.     Pulses: Normal pulses.     Heart sounds: Normal heart sounds.  Pulmonary:     Effort: Pulmonary effort is normal.     Breath sounds: Normal breath sounds.  Neurological:     Mental Status: He is alert and oriented to person, place, and time.     No results found for any visits on 05/13/23.    Assessment & Plan:    Problem List  Items Addressed This Visit     Benign prostate hyperplasia    Followed by [facility]: Dr. Mena Goes Current use of avodart No hematuria or dysuria or retention Repeat PSA      Relevant Orders   PSA, Medicare   Essential hypertension - Primary    BP at goal with lisinopril/hctz BP Readings from Last 3 Encounters:  05/13/23 136/70  05/11/23 136/80  01/05/23 120/87    Maintain med dose Repeat CMP      Relevant Orders   Comprehensive metabolic panel   Hyperlipidemia    Repeat lipid panel      Relevant Orders   Comprehensive metabolic panel   Lipid panel   Hypothyroidism    Repeat TSH and T4 Maintain levothyroxine dose Wt Readings from Last 3 Encounters:  05/13/23 168 lb 12.8 oz (76.6 kg)  05/11/23 168 lb (76.2 kg)  01/05/23 162 lb (73.5 kg)         Relevant Orders   T4, free   TSH   Return in about 6 months (around 11/10/2023) for Hypothyroidism, hyperlipidemia (fasting).     Alysia Penna, NP

## 2023-05-13 NOTE — Assessment & Plan Note (Signed)
BP at goal with lisinopril/hctz BP Readings from Last 3 Encounters:  05/13/23 136/70  05/11/23 136/80  01/05/23 120/87    Maintain med dose Repeat CMP

## 2023-05-13 NOTE — Assessment & Plan Note (Signed)
Followed by [facility]: Dr. Mena Goes Current use of avodart No hematuria or dysuria or retention Repeat PSA

## 2023-05-20 ENCOUNTER — Other Ambulatory Visit: Payer: Self-pay | Admitting: Nurse Practitioner

## 2023-05-20 DIAGNOSIS — E039 Hypothyroidism, unspecified: Secondary | ICD-10-CM

## 2023-06-09 ENCOUNTER — Other Ambulatory Visit: Payer: Self-pay | Admitting: Nurse Practitioner

## 2023-06-09 DIAGNOSIS — E7800 Pure hypercholesterolemia, unspecified: Secondary | ICD-10-CM

## 2023-07-07 ENCOUNTER — Telehealth: Payer: Self-pay | Admitting: Internal Medicine

## 2023-07-07 NOTE — Telephone Encounter (Signed)
Inbound call from patient stating he feels as though his esophagus is on "fire". Patient has been scheduled for 2/12. Requesting a call to discuss options to help in the meantime. Please advise, thank you.

## 2023-07-07 NOTE — Telephone Encounter (Signed)
Left message on machine to call back  

## 2023-07-12 NOTE — Telephone Encounter (Signed)
Pt did not return call, will await further communication from pt. 

## 2023-07-21 ENCOUNTER — Other Ambulatory Visit: Payer: Self-pay | Admitting: Nurse Practitioner

## 2023-07-21 DIAGNOSIS — I10 Essential (primary) hypertension: Secondary | ICD-10-CM

## 2023-08-03 ENCOUNTER — Ambulatory Visit (INDEPENDENT_AMBULATORY_CARE_PROVIDER_SITE_OTHER): Payer: Medicare Other | Admitting: Gastroenterology

## 2023-08-03 ENCOUNTER — Encounter: Payer: Self-pay | Admitting: Gastroenterology

## 2023-08-03 VITALS — BP 120/74 | HR 77 | Ht 73.0 in | Wt 177.0 lb

## 2023-08-03 DIAGNOSIS — Z8719 Personal history of other diseases of the digestive system: Secondary | ICD-10-CM | POA: Diagnosis not present

## 2023-08-03 DIAGNOSIS — R070 Pain in throat: Secondary | ICD-10-CM | POA: Diagnosis not present

## 2023-08-03 DIAGNOSIS — K219 Gastro-esophageal reflux disease without esophagitis: Secondary | ICD-10-CM

## 2023-08-03 DIAGNOSIS — R0989 Other specified symptoms and signs involving the circulatory and respiratory systems: Secondary | ICD-10-CM | POA: Insufficient documentation

## 2023-08-03 HISTORY — DX: Pain in throat: R07.0

## 2023-08-03 MED ORDER — FAMOTIDINE 20 MG PO TABS: 20.0000 mg | ORAL_TABLET | Freq: Every day | ORAL | 5 refills | Status: DC

## 2023-08-03 NOTE — Progress Notes (Signed)
Shanda Bumps, I would have a low threshold for formal ENT evaluation regarding this complaint, if he has not had one recently for this complaint. Thanks, Dr. Marina Goodell

## 2023-08-03 NOTE — Progress Notes (Signed)
08/03/2023 Russell Brown [id number] Jul 06, 1945   HISTORY OF PRESENT ILLNESS: This is a 78 year old male who is a patient of Dr. Lamar Sprinkles.  Patient describes recently several days of feeling that his throat was on fire.  Despite the burning in his throat he did not notice regurgitation or burning in his chest that would be consistent with acid reflux.  He is on omeprazole 40 mg twice daily, but takes that before breakfast in the morning and then at bedtime.  He also has a chronic throat clearing which has been an ongoing issue.  Symptoms improved slightly right now, but still present.  EGD March 2023 essentially normal with just a small hiatal hernia.   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    Pulmonary fibrosis (HCC)    Sleep apnea    mild - but does not wear a c-pap per pt   Thyroid disease    thyroid nodules - half of thyroid was removed per pt   Past Surgical History:  Procedure Laterality Date   APPENDECTOMY     BACK SURGERY     CARDIAC CATHETERIZATION  1980s   "no blockage at that time" (02/20/2018)   CERVICAL SPINE SURGERY     "enlarged hole that nerves went thru"   FINGER AMPUTATION Left 2004   2nd and 3rd digits; "table saw injury"   FOOT FRACTURE SURGERY Left    "heel OR; put 2 steel rods in and took them out 6 wks later"   FRACTURE SURGERY     HERNIA REPAIR Right 1965   LEFT HEART CATH AND CORONARY ANGIOGRAPHY N/A 02/21/2018   Procedure: LEFT HEART CATH AND CORONARY ANGIOGRAPHY;  Surgeon: Lennette Bihari, MD;  Location: [facility];  Service: Cardiovascular;  Laterality: N/A;   NASAL SEPTUM SURGERY  1986   NISSEN FUNDOPLICATION  1990s   "w/hernia repair"   THYROIDECTOMY, PARTIAL  2000s   WISDOM TOOTH EXTRACTION      reports that he quit smoking about 26 years ago. His smoking use included cigarettes. He started smoking about 62 years ago. He has a 35 pack-year smoking history. He has been exposed to tobacco smoke. He has never used smokeless tobacco. He reports current alcohol use of about 14.0 standard drinks of alcohol per week. He reports that he does not use drugs. family history includes Alzheimer's disease in his paternal uncle; Asthma in his son; Cancer in his father and maternal grandmother; Heart disease in his paternal grandfather and paternal grandmother; Hyperlipidemia in his father; Hypertension in his father; Osteoporosis in his maternal grandmother, mother, and paternal aunt; Parkinsonism in his mother; Pneumonia in his mother; Prostate cancer in his father. Allergies  Allergen Reactions   Dextromorphan Hydrochloride [Dextromethorphan]     Dizzy, nervous, mental clouding.    Codeine Anxiety    Reaction if taken for extended periods of time.      Outpatient Encounter Medications as of 08/03/2023  Medication  Sig   albuterol (VENTOLIN HFA) 108 (90 Base) MCG/ACT inhaler Inhale 2 puffs into the lungs every 6 (six) hours as needed for wheezing or shortness of breath.   aspirin EC 81 MG tablet Take 81 mg by mouth daily.   atorvastatin (LIPITOR) 20 MG tablet TAKE 1 TABLET BY MOUTH DAILY   calcium citrate (CALCITRATE - DOSED IN MG ELEMENTAL CALCIUM) 950 (200 Ca) MG tablet Take 600 mg of elemental calcium by mouth daily.   cholecalciferol (VITAMIN D3) 25 MCG (1000 UNIT) tablet Take 800 Units by mouth daily.   dutasteride (AVODART) 0.5 MG capsule Take 1 capsule (0.5 mg total) by mouth daily.   fish oil-omega-3 fatty acids 1000 MG capsule Take 2 g by mouth daily.     Fluticasone-Umeclidin-Vilant (TRELEGY ELLIPTA) 100-62.5-25 MCG/ACT AEPB Inhale 1 puff into the lungs daily.   levothyroxine (SYNTHROID) 75 MCG tablet TAKE 1 TABLET BY MOUTH DAILY  BEFORE BREAKFAST   lisinopril-hydrochlorothiazide (ZESTORETIC) 10-12.5 MG tablet TAKE 1 TABLET BY MOUTH DAILY   Multiple Vitamin (MULTIVITAMIN WITH MINERALS) TABS tablet Take 1 tablet by mouth daily.   nitroGLYCERIN (NITROSTAT) 0.4 MG SL tablet PLACE 1 TABLET UNDER THE TONGUE EVERY 5 MINUTES AS NEEDED   omeprazole (PRILOSEC) 40 MG capsule TAKE 1 CAPSULE BY MOUTH TWICE  DAILY   sertraline (ZOLOFT) 100 MG tablet Take 1.5 tablets (150 mg total) by mouth daily.   No facility-administered encounter medications on file as of 08/03/2023.     REVIEW OF SYSTEMS  : All other systems reviewed and negative except where noted in the History of Present Illness.   PHYSICAL EXAM: BP 120/74 (BP Location: Left Arm, Patient Position: Sitting, Cuff Size: Normal)   Pulse 77   Ht 6\' 1"  (1.854 m)   Wt 177 lb (80.3 kg)   SpO2 97%   BMI 23.35 kg/m  General: Well developed white male in no acute distress Head: Normocephalic and atraumatic Eyes:  Sclerae anicteric, conjunctiva pink. Ears: Normal auditory acuity Lungs: Decreased breath sounds but clear. Heart: Regular rate and rhythm; no M/R/G. Abdomen: Soft, non-distended.  BS present.  Non-tender. Musculoskeletal: Symmetrical with no gross deformities  Skin: No lesions on visible extremities Extremities: No edema  Neurological: Alert oriented x 4, grossly non-focal Psychological:  Alert and cooperative. Normal mood and affect  ASSESSMENT AND PLAN: *Throat burning: Patient describes recently several days of feeling that his throat was on fire.  Despite the burning in his throat he did not notice regurgitation or burning in his chest that would be consistent with acid reflux.  He is on omeprazole 40 mg twice daily, but takes that before breakfast in the morning and then at bedtime.   He also has a chronic throat clearing which has been an ongoing issue.  EGD in March 2023 was essentially normal besides a small hiatal hernia.  Not sure whether this is related to acid reflux issues, but it is now improved somewhat.  I am going to have him move his nighttime dose of omeprazole to dinnertime as that is when it should be taken anyway.  I am also add famotidine 20 mg at bedtime.  New prescription sent to pharmacy.  He will send a message back in about 4 to 6 weeks with an update on his symptoms.  ?  If symptoms continue if he would need repeat EGD vs pH study with impedance on versus off PPI therapy.  CC:  Nche, Bonna Gains, NP

## 2023-08-03 NOTE — Patient Instructions (Addendum)
We have sent the following medications to your pharmacy for you to pick up at your convenience: Famotidine 20 mg at bedtime.   Change Omeprazole 40 mg twice daily 30-60 minutes before breakfast and dinner.   Send Mychart message in 4-6 weeks with an update.   _______________________________________________________  If your blood pressure at your visit was 140/90 or greater, please contact your primary care physician to follow up on this.  _______________________________________________________  If you are age 55 or older, your body mass index should be between 23-30. Your Body mass index is 23.35 kg/m. If this is out of the aforementioned range listed, please consider follow up with your Primary Care Provider.  If you are age 40 or younger, your body mass index should be between 19-25. Your Body mass index is 23.35 kg/m. If this is out of the aformentioned range listed, please consider follow up with your Primary Care Provider.   ________________________________________________________  The [place] [facility] providers would like to encourage you to use [facility] to communicate with providers for non-urgent requests or questions.  Due to long hold times on the telephone, sending your provider a message by [facility] may be a faster and more efficient way to get a response.  Please allow 48 business hours for a response.  Please remember that this is for non-urgent requests.  _______________________________________________________

## 2023-09-08 ENCOUNTER — Telehealth: Payer: Self-pay

## 2023-09-08 NOTE — Telephone Encounter (Signed)
 Spoke with the pt and he states he is doing very well with the medication changes. He will call if he does not continue to improve.

## 2023-09-08 NOTE — Telephone Encounter (Deleted)
 Left message on machine to call back

## 2023-09-08 NOTE — Telephone Encounter (Signed)
-----   Message from Benson D. Zehr sent at 09/08/2023  1:25 PM EDT ----- I have not heard back from him with an update.  Please touch base with him and see how his throat burning is doing on the omeprazole twice daily with moving the evening dose to dinnertime and adding the famotidine at bedtime? ----- Message ----- From: Hilarie Fredrickson, MD Sent: 08/03/2023   1:36 PM EDT To: Leta Baptist, PA-C     ----- Message ----- From: Leta Baptist, PA-C Sent: 08/03/2023  12:37 PM EST To: Hilarie Fredrickson, MD

## 2023-09-16 ENCOUNTER — Other Ambulatory Visit: Payer: Self-pay | Admitting: Nurse Practitioner

## 2023-09-16 DIAGNOSIS — N400 Enlarged prostate without lower urinary tract symptoms: Secondary | ICD-10-CM

## 2023-09-16 DIAGNOSIS — K219 Gastro-esophageal reflux disease without esophagitis: Secondary | ICD-10-CM

## 2023-09-16 DIAGNOSIS — I10 Essential (primary) hypertension: Secondary | ICD-10-CM

## 2023-09-16 DIAGNOSIS — E7800 Pure hypercholesterolemia, unspecified: Secondary | ICD-10-CM

## 2023-09-16 DIAGNOSIS — E039 Hypothyroidism, unspecified: Secondary | ICD-10-CM

## 2023-09-16 DIAGNOSIS — F411 Generalized anxiety disorder: Secondary | ICD-10-CM

## 2023-09-16 DIAGNOSIS — F4310 Post-traumatic stress disorder, unspecified: Secondary | ICD-10-CM

## 2023-09-16 MED ORDER — SERTRALINE HCL 100 MG PO TABS
150.0000 mg | ORAL_TABLET | Freq: Every day | ORAL | 3 refills | Status: AC
Start: 2023-09-16 — End: ?

## 2023-09-16 MED ORDER — LEVOTHYROXINE SODIUM 75 MCG PO TABS
75.0000 ug | ORAL_TABLET | Freq: Every day | ORAL | 1 refills | Status: DC
Start: 2023-09-16 — End: 2024-04-19

## 2023-09-16 MED ORDER — DUTASTERIDE 0.5 MG PO CAPS
0.5000 mg | ORAL_CAPSULE | Freq: Every day | ORAL | 3 refills | Status: AC
Start: 2023-09-16 — End: ?

## 2023-09-16 MED ORDER — OMEPRAZOLE 40 MG PO CPDR: 40.0000 mg | DELAYED_RELEASE_CAPSULE | Freq: Two times a day (BID) | ORAL | 1 refills | Status: DC

## 2023-09-16 MED ORDER — ATORVASTATIN CALCIUM 20 MG PO TABS
20.0000 mg | ORAL_TABLET | Freq: Every day | ORAL | 3 refills | Status: AC
Start: 2023-09-16 — End: ?

## 2023-09-16 MED ORDER — LISINOPRIL-HYDROCHLOROTHIAZIDE 10-12.5 MG PO TABS
1.0000 | ORAL_TABLET | Freq: Every day | ORAL | 1 refills | Status: DC
Start: 2023-09-16 — End: 2024-04-19

## 2023-09-16 NOTE — Progress Notes (Signed)
 Switched pharmacy to [facility] per wife's request.

## 2023-11-10 ENCOUNTER — Ambulatory Visit (INDEPENDENT_AMBULATORY_CARE_PROVIDER_SITE_OTHER): Payer: Medicare Other | Admitting: Nurse Practitioner

## 2023-11-10 ENCOUNTER — Encounter: Payer: Self-pay | Admitting: Nurse Practitioner

## 2023-11-10 ENCOUNTER — Ambulatory Visit: Payer: Self-pay | Admitting: Nurse Practitioner

## 2023-11-10 VITALS — BP 134/74 | HR 63 | Temp 97.4°F | Ht 73.0 in | Wt 169.4 lb

## 2023-11-10 DIAGNOSIS — I10 Essential (primary) hypertension: Secondary | ICD-10-CM

## 2023-11-10 DIAGNOSIS — E038 Other specified hypothyroidism: Secondary | ICD-10-CM | POA: Diagnosis not present

## 2023-11-10 DIAGNOSIS — J849 Interstitial pulmonary disease, unspecified: Secondary | ICD-10-CM | POA: Diagnosis not present

## 2023-11-10 DIAGNOSIS — F19982 Other psychoactive substance use, unspecified with psychoactive substance-induced sleep disorder: Secondary | ICD-10-CM | POA: Diagnosis not present

## 2023-11-10 LAB — BASIC METABOLIC PANEL WITH GFR
BUN: 14 mg/dL (ref 6–23)
CO2: 29 meq/L (ref 19–32)
Calcium: 9.1 mg/dL (ref 8.4–10.5)
Chloride: 102 meq/L (ref 96–112)
Creatinine, Ser: 0.93 mg/dL (ref 0.40–1.50)
GFR: 78.98 mL/min (ref >60.00)
Glucose, Bld: 99 mg/dL (ref 70–99)
Potassium: 4.2 meq/L (ref 3.5–5.1)
Sodium: 139 meq/L (ref 135–145)

## 2023-11-10 LAB — T4, FREE: Free T4: 1.15 ng/dL (ref 0.60–1.60)

## 2023-11-10 LAB — TSH: TSH: 2.09 u[IU]/mL (ref 0.35–5.50)

## 2023-11-10 NOTE — Assessment & Plan Note (Signed)
 Repeat TSH and T4 Maintain levothyroxine  dose Wt Readings from Last 3 Encounters:  11/10/23 169 lb 6.4 oz (76.8 kg)  08/03/23 177 lb (80.3 kg)  05/13/23 168 lb 12.8 oz (76.6 kg)

## 2023-11-10 NOTE — Progress Notes (Signed)
 Established Patient Visit  Patient: Russell Brown   DOB: [birth year]   77 y.o. Male  MRN: [id number] Visit Date: 11/10/2023  Subjective:     Chief Complaint  Patient presents with   Hyperlipidemia    Fasting.   Essential hypertension BP at goal with lisinopril /hctz BP Readings from Last 3 Encounters:  11/10/23 134/74  08/03/23 120/74  05/13/23 136/70    Maintain med dose Repeat bMP  ILD (interstitial lung disease) (HCC) Unable to tolerate experimental medications. Denies any acte exacerbations. No need for supplemental oxygen at this time Under the care of pulmonology Current use of albuterol  and trelegy  Hypothyroidism Repeat TSH and T4 Maintain levothyroxine  dose Wt Readings from Last 3 Encounters:  11/10/23 169 lb 6.4 oz (76.8 kg)  08/03/23 177 lb (80.3 kg)  05/13/23 168 lb 12.8 oz (76.6 kg)      Reviewed medical, surgical, and social history today  Medications: Outpatient Medications Prior to Visit  Medication Sig   albuterol  (VENTOLIN  HFA) 108 (90 Base) MCG/ACT inhaler Inhale 2 puffs into the lungs every 6 (six) hours as needed for wheezing or shortness of breath.   aspirin  EC 81 MG tablet Take 81 mg by mouth daily.   atorvastatin  (LIPITOR ) 20 MG tablet Take 1 tablet (20 mg total) by mouth daily.   calcium  citrate (CALCITRATE - DOSED IN MG ELEMENTAL CALCIUM ) 950 (200 Ca) MG tablet Take 600 mg of elemental calcium  by mouth daily.   cholecalciferol (VITAMIN D3) 25 MCG (1000 UNIT) tablet Take 800 Units by mouth daily.   dutasteride  (AVODART ) 0.5 MG capsule Take 1 capsule (0.5 mg total) by mouth daily.   famotidine  (PEPCID ) 20 MG tablet Take 1 tablet (20 mg total) by mouth at bedtime.   fish oil-omega-3 fatty acids  1000 MG capsule Take 2 g by mouth daily.    Fluticasone -Umeclidin-Vilant (TRELEGY ELLIPTA ) 100-62.5-25 MCG/ACT AEPB Inhale 1 puff into the lungs daily.   levothyroxine  (SYNTHROID ) 75 MCG tablet Take 1 tablet (75 mcg total) by mouth  daily before breakfast.   lisinopril -hydrochlorothiazide  (ZESTORETIC ) 10-12.5 MG tablet Take 1 tablet by mouth daily.   Multiple Vitamin (MULTIVITAMIN WITH MINERALS) TABS tablet Take 1 tablet by mouth daily.   nitroGLYCERIN  (NITROSTAT ) 0.4 MG SL tablet PLACE 1 TABLET UNDER THE TONGUE EVERY 5 MINUTES AS NEEDED   omeprazole  (PRILOSEC) 40 MG capsule Take 1 capsule (40 mg total) by mouth 2 (two) times daily.   sertraline  (ZOLOFT ) 100 MG tablet Take 1.5 tablets (150 mg total) by mouth daily.   No facility-administered medications prior to visit.   Reviewed past medical and social history.   ROS per HPI above      Objective:  BP 134/74 (BP Location: Right Arm, Patient Position: Sitting)   Pulse 63   Temp (!) 97.4 F (36.3 C) (Temporal)   Ht 6\' 1"  (1.854 m)   Wt 169 lb 6.4 oz (76.8 kg)   SpO2 94%   BMI 22.35 kg/m      Physical Exam Vitals and nursing note reviewed.  Cardiovascular:     Rate and Rhythm: Normal rate and regular rhythm.     Pulses: Normal pulses.     Heart sounds: Normal heart sounds.  Pulmonary:     Effort: Pulmonary effort is normal.     Breath sounds: Normal breath sounds.  Musculoskeletal:     Right lower leg: No edema.     Left lower leg:  No edema.  Neurological:     Mental Status: He is alert and oriented to person, place, and time.     No results found for any visits on 11/10/23.    Assessment & Plan:    Problem List Items Addressed This Visit     RESOLVED: Drug-induced insomnia (HCC)   Essential hypertension   BP at goal with lisinopril /hctz BP Readings from Last 3 Encounters:  11/10/23 134/74  08/03/23 120/74  05/13/23 136/70    Maintain med dose Repeat bMP      Relevant Orders   Basic metabolic panel with GFR   Hypothyroidism - Primary   Repeat TSH and T4 Maintain levothyroxine  dose Wt Readings from Last 3 Encounters:  11/10/23 169 lb 6.4 oz (76.8 kg)  08/03/23 177 lb (80.3 kg)  05/13/23 168 lb 12.8 oz (76.6 kg)          Relevant Orders   TSH   T4, free   ILD (interstitial lung disease) (HCC)   Unable to tolerate experimental medications. Denies any acte exacerbations. No need for supplemental oxygen at this time Under the care of pulmonology Current use of albuterol  and trelegy      Return in about 6 months (around 05/12/2024) for HTN, Hypothyroidism, hyperlipidemia (fasting).     Kathrene Parents, NP

## 2023-11-10 NOTE — Assessment & Plan Note (Signed)
 BP at goal with lisinopril /hctz BP Readings from Last 3 Encounters:  11/10/23 134/74  08/03/23 120/74  05/13/23 136/70    Maintain med dose Repeat bMP

## 2023-11-10 NOTE — Patient Instructions (Signed)
 Go to lab Maintain Heart healthy diet and daily exercise. Maintain current medications.

## 2023-11-10 NOTE — Assessment & Plan Note (Signed)
 Unable to tolerate experimental medications. Denies any acte exacerbations. No need for supplemental oxygen at this time Under the care of pulmonology Current use of albuterol  and trelegy

## 2023-11-14 ENCOUNTER — Other Ambulatory Visit: Payer: Self-pay | Admitting: Pulmonary Disease

## 2024-01-30 ENCOUNTER — Ambulatory Visit: Payer: Self-pay

## 2024-01-30 ENCOUNTER — Other Ambulatory Visit: Payer: Self-pay

## 2024-01-30 ENCOUNTER — Telehealth: Payer: Self-pay | Admitting: Nurse Practitioner

## 2024-01-30 ENCOUNTER — Encounter: Payer: Self-pay | Admitting: Cardiology

## 2024-01-30 DIAGNOSIS — R072 Precordial pain: Secondary | ICD-10-CM

## 2024-01-30 MED ORDER — NITROGLYCERIN 0.4 MG SL SUBL: SUBLINGUAL_TABLET | SUBLINGUAL | 0 refills | Status: AC

## 2024-01-30 NOTE — Telephone Encounter (Signed)
 FYI Only or Action Required?: Action required by provider: update on patient condition.  Patient was last seen in primary care on 11/10/2023 by Nche, Roselie Rockford, NP.  Called Nurse Triage reporting Arm Pain.  Symptoms began a week ago.  Interventions attempted: Nothing.  Symptoms are: unchanged.Pt. States he has a burning sensation to left arm and shoulder, mild pain. No rash or swelling. No weakness. Has an appointment for Wednesday, will call back for worsening of symptoms.  Triage Disposition: See PCP When Office is Open (Within 3 Days)  Patient/caregiver understands and will follow disposition?: Yes    Reason for Disposition  [1] MODERATE pain (e.g., interferes with normal activities) AND [2] present > 3 days  Answer Assessment - Initial Assessment Questions 1. ONSET: When did the pain start?     1 week 2. LOCATION: Where is the pain located?     Left arm and shoulder 3. PAIN: How bad is the pain? (Scale 0-10; or none, mild, moderate, severe)     Burning - mild 4. WORK OR EXERCISE: Has there been any recent work or exercise that involved this part of the body?     no 5. CAUSE: What do you think is causing the arm pain?     unsure 6. OTHER SYMPTOMS: Do you have any other symptoms? (e.g., neck pain, swelling, rash, fever, numbness, weakness)     no 7. PREGNANCY: Is there any chance you are pregnant? When was your last menstrual period?     N/a  Protocols used: Arm Pain-A-AH

## 2024-01-30 NOTE — Telephone Encounter (Signed)
 FYI: This call has been transferred to triage nurse: the Triage Nurse. Once the result note has been entered staff can address the message at that time.  Patient called in with the following symptoms:  Red Word:Russell Brown scheduled acute visit with Roselie on 02/01/24 via mychart-BURNING PAIN IN LEFT ARMPIT AND SHOULDER FOR OVER A WEEK NOW - I called Russell Brown and transferred over to nurse triage   Please advise at Mobile [phone]   Message is routed to Provider Pool.

## 2024-02-01 ENCOUNTER — Ambulatory Visit: Admitting: Nurse Practitioner

## 2024-02-29 ENCOUNTER — Telehealth: Payer: Self-pay

## 2024-02-29 NOTE — Telephone Encounter (Signed)
 Called patient and informed him of the following:   At this time, your provider is unable to issue a prescription for the COVID-19 vaccine. We are actively developing protocols to ensure you receive the appropriate documentation required for vaccination. We kindly ask that you check back with our office in 14 business days for an update on the availability of this documentation. Thank you for your patience and understanding as we work to provide you with safe, accurate, and timely support.  Patient verbalized understanding and all (if any) questions were answered.

## 2024-02-29 NOTE — Telephone Encounter (Signed)
 Copied from CRM #[id number]. Topic: Clinical - Medication Question >> Feb 29, 2024 11:34 AM Dedra B wrote: Reason for CRM: Pt call to request a prescription for covid vaccine and the flu shot be sent to [facility] on [address]. Pls notify pt when it has been sent.

## 2024-03-26 ENCOUNTER — Ambulatory Visit: Admitting: Nurse Practitioner

## 2024-03-26 ENCOUNTER — Ambulatory Visit: Payer: Self-pay

## 2024-03-26 NOTE — Telephone Encounter (Signed)
 FYI Only or Action Required?: FYI only for provider.  Patient was last seen in primary care on 11/10/2023 by Nche, Roselie Rockford, NP.  Called Nurse Triage reporting Shortness of Breath.  Symptoms began several weeks ago.  Interventions attempted: Prescription medications: inhalers.  Symptoms are: gradually worsening.  Triage Disposition: See HCP Within 4 Hours (Or PCP Triage)  Patient/caregiver understands and will follow disposition?: Yes  Copied from CRM #[id number]. Topic: Clinical - Red Word Triage >> Mar 26, 2024 10:10 AM Rozanna MATSU wrote: [facility] that prompted transfer to Nurse Triage: shortness of breath Reason for Disposition  [1] Longstanding difficulty breathing (e.g., CHF, COPD, emphysema) AND [2] WORSE than normal  Answer Assessment - Initial Assessment Questions Pt has chronic lung issues; and over the last two weeks states his breathing has gotten worse. It used to only be bad with exertion, but now he becomes winded doing anything. Pt speaking in full sentences but becomes slightly winded after about a paragraph. O2 sats >95%. States he has a new onset dry cough over last two weeks.   1. RESPIRATORY STATUS: Describe your breathing? (e.g., wheezing, shortness of breath, unable to speak, severe coughing)      SOB, worsens with exertion, but now gets winded from talking too much 2. ONSET: When did this breathing problem begin?      Two weeks ago  3. PATTERN Does the difficult breathing come and go, or has it been constant since it started?      constant 4. SEVERITY: How bad is your breathing? (e.g., mild, moderate, severe)      moderate 5. RECURRENT SYMPTOM: Have you had difficulty breathing before? If Yes, ask: When was the last time? and What happened that time?      Yes,  6. CARDIAC HISTORY: Do you have any history of heart disease? (e.g., heart attack, angina, bypass surgery, angioplasty)      denies 7. LUNG HISTORY: Do you have any history of lung  disease?  (e.g., pulmonary embolus, asthma, emphysema)     PF; emphysema, asthma 8. CAUSE: What do you think is causing the breathing problem?      Worsening situation 9. OTHER SYMPTOMS: Do you have any other symptoms? (e.g., chest pain, cough, dizziness, fever, runny nose)     Cough; dry; Wife states pt looks pale 10. O2 SATURATION MONITOR:  Do you use an oxygen saturation monitor (pulse oximeter) at home? If Yes, ask: What is your reading (oxygen level) today? What is your usual oxygen saturation reading? (e.g., 95%)       >95%  12. TRAVEL: Have you traveled out of the country in the last month? (e.g., travel history, exposures)       denies  Protocols used: Breathing Difficulty-A-AH

## 2024-03-26 NOTE — Telephone Encounter (Signed)
 Patient called stating the appointment scheduled for today was for his PCP and not pulmonology as requested. I have cancelled the appointment that was scheduled in error and scheduled him an appointment tomorrow with pulmonology. Patient in no distress at this time with his breathing. Patient had no further needs at this time.

## 2024-03-27 ENCOUNTER — Ambulatory Visit (INDEPENDENT_AMBULATORY_CARE_PROVIDER_SITE_OTHER): Admitting: Acute Care

## 2024-03-27 ENCOUNTER — Encounter: Payer: Self-pay | Admitting: Acute Care

## 2024-03-27 VITALS — BP 123/72 | HR 72 | Temp 98.0°F | Ht 73.0 in | Wt 172.2 lb

## 2024-03-27 DIAGNOSIS — R058 Other specified cough: Secondary | ICD-10-CM | POA: Diagnosis not present

## 2024-03-27 DIAGNOSIS — R079 Chest pain, unspecified: Secondary | ICD-10-CM

## 2024-03-27 DIAGNOSIS — J849 Interstitial pulmonary disease, unspecified: Secondary | ICD-10-CM

## 2024-03-27 DIAGNOSIS — J84112 Idiopathic pulmonary fibrosis: Secondary | ICD-10-CM | POA: Diagnosis not present

## 2024-03-27 DIAGNOSIS — Z87891 Personal history of nicotine dependence: Secondary | ICD-10-CM

## 2024-03-27 DIAGNOSIS — R0609 Other forms of dyspnea: Secondary | ICD-10-CM

## 2024-03-27 MED ORDER — TRELEGY ELLIPTA 200-62.5-25 MCG/ACT IN AEPB: 1.0000 | INHALATION_SPRAY | Freq: Every day | RESPIRATORY_TRACT | Status: DC

## 2024-03-27 MED ORDER — TRELEGY ELLIPTA 200-62.5-25 MCG/ACT IN AEPB
1.0000 | INHALATION_SPRAY | Freq: Every day | RESPIRATORY_TRACT | 12 refills | Status: AC
Start: 2024-03-27 — End: ?

## 2024-03-27 MED ORDER — ALBUTEROL SULFATE (2.5 MG/3ML) 0.083% IN NEBU
2.5000 mg | INHALATION_SOLUTION | Freq: Four times a day (QID) | RESPIRATORY_TRACT | 12 refills | Status: AC | PRN
Start: 2024-03-27 — End: ?

## 2024-03-27 NOTE — Progress Notes (Signed)
 History of Present Illness Russell Brown is a 78 y.o. male male with history of COPD, asthma, GERD, hiatal hernia status post Nissen's fundoplication.  He was followed by Dr. Shellia and was last seen by  Dr. Theophilus . Referred for CT scan findings showing pulmonary fibrosis/ ILD. CT imaging in February 2024 showed mild pulmonary fibrosis with UIP pattern, no progression since last CT imaging in November 2023.  He has worsening dyspnea on exertion over several years, nonproductive cough. Maintained on anoro inhaler. Unable to tolerate OFEV  or Esbriet  and  discontinued 04/2022.    03/27/2024 Discussed the use of AI scribe software for clinical note transcription with the patient, who gave verbal consent to proceed.  History of Present Illness Pt. Presents for an acute OV. He was last seen in this office 09/2022 by Landry Ferrari NP. No Chest imaging since 08/06/2022.  Pt. Did have a recent Calcium  Scoring scan 10/29/2023, which had an over read of Severe COPD and fibrotic lung disease. No active disease.Small hiatal hernia.  He presents today for worsening shortness of breath with minimal exertion. He has not been treating his ILD due to the fact both Esbriet  and Ofev  were intolerable to him.  He lost approximately 30 pounds.  He has been using his Trelegy 100 daily.  He does state that this helps with his shortness of breath. Initially, exertion such as hiking or climbing stairs would cause shortness of breath, but now even minimal exertion, like walking from one room to another, results in significant breathlessness. In July, he had a severe attack while working outside, leading to collapse and requiring the use of an albuterol  inhaler for relief. Another similar episode occurred recently while loading his truck, where the inhaler provided relief  Over the last several months he has noticed worsening dry cough, worsening dyspnea.  We discussed very openly that this is most likely related to progression  of his ILD.  We discussed that we need to get new baselines, I have ordered HRCT, pulmonary function testing, and 6-minute walk.  I have also increased his Trelegy to 200 from 100, and have added albuterol  nebs.  He states when he gets out of breath that the albuterol  does help him.  We had a long discussion about how he is straining his heart when his oxygen levels dropped with exertion, and how important it is to treat what we can to keep him as healthy as possible.  We did discuss that there is a new drug for fibrosis that we will be coming out soon that has a better side effect profile.  I have encouraged him to consider trying this.  He also asked about stem cell treatment to improve his ILD.  I advised him to talk to either Dr. Theophilus or Dr. Geronimo regarding this as they would most likely know if there have been any studies that show promising outcomes.  Patient will return to the clinic once PFTs, HRCT, and 6-minute walk been completed to restage his disease for progression and to discuss new options for treatment.  We did discuss that the patient may need oxygen with ambulation.  He said if his 6-minute walk shows he needs that he is up and to the idea.  He was very open with me and told me that he is only here today because of his wife, and his family are asking him to seek care.  He does have considerable crackles in his bases and midway up his back.  He does have a dry nonproductive cough, and some pain in his sternum when he coughs.  No fever, discolored secretions.   Test Results: 10/29/2023 CT Coronary Morph w/ CTA COR w/Score Heart is normal size. Aorta normal caliber. Small hiatal hernia. No adenopathy. Severe centrilobular emphysema and fibrotic lung disease, unchanged since prior study. No acute confluent opacities or effusions. No acute findings in the upper abdomen. Chest wall soft tissues are unremarkable. No acute bony abnormality.   IMPRESSION: Severe COPD and fibrotic lung  disease.  08/06/2022 CT chest No acute consolidative airspace disease to suggest a pneumonia. Severe centrilobular emphysema with mild diffuse bronchial wall thickening, suggesting COPD. 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 No appreciable interval progression. Two-vessel coronary atherosclerosis.Small to moderate hiatal hernia. Diffuse osteopenia. Aortic Atherosclerosis (ICD10-I70.0) and Emphysema (ICD10-J43.9).               Latest Ref Rng & Units 08/05/2022    9:53 AM 04/08/2022    9:33 AM 05/06/2021    9:15 AM  CBC  WBC 4.0 - 10.5 K/uL 7.4  7.1  7.2   Hemoglobin 13.0 - 17.0 g/dL 83.8  84.5  84.3   Hematocrit 39.0 - 52.0 % 47.1  46.7  47.0   Platelets 150.0 - 400.0 K/uL 210.0  203.0  215.0        Latest Ref Rng & Units 11/10/2023    9:56 AM 05/13/2023   10:18 AM 08/05/2022    9:53 AM  BMP  Glucose 70 - 99 mg/dL 99  893  92   BUN 6 - 23 mg/dL 14  16  14    Creatinine 0.40 - 1.50 mg/dL 9.06  9.02  8.99   Sodium 135 - 145 mEq/L 139  139  138   Potassium 3.5 - 5.1 mEq/L 4.2  3.8  3.9   Chloride 96 - 112 mEq/L 102  101  100   CO2 19 - 32 mEq/L 29  30  26    Calcium  8.4 - 10.5 mg/dL 9.1  9.2  9.3     BNP No results found for: BNP  ProBNP    Component Value Date/Time   PROBNP 35.0 08/05/2022 0953    PFT    Component Value Date/Time   FEV1PRE 1.71 09/29/2022 0954   FEV1POST 1.91 09/29/2022 0954   FVCPRE 3.51 09/29/2022 0954   FVCPOST 3.70 09/29/2022 0954   TLC 6.73 09/29/2022 0954   DLCOUNC 11.77 09/29/2022 0954   PREFEV1FVCRT 49 09/29/2022 0954   PSTFEV1FVCRT 52 09/29/2022 0954    No results found.   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   CAP (community acquired pneumonia)  03/03/2012   COPD (chronic obstructive pulmonary disease) (HCC)    GERD (gastroesophageal reflux disease)    History of hiatal hernia    Hyperlipemia    Hypertension    Hypothyroidism    Pneumonia 2013; 2017   Preventative health care 12/06/2016   PTSD (post-traumatic stress disorder)    retired Emergency planning/management officer    Pulmonary fibrosis (HCC)    Sleep apnea    mild - but does not wear a c-pap per pt   Throat burning 08/03/2023   Thyroid  disease    thyroid  nodules - half of thyroid  was  removed per pt     Social History   Tobacco Use   Smoking status: Former    Current packs/day: 0.00    Average packs/day: 1 pack/day for 35.0 years (35.0 ttl pk-yrs)    Types: Cigarettes    Start date: 08/18/1961    Quit date: 08/18/1996    Years since quitting: 27.6    Passive exposure: Past   Smokeless tobacco: Never  Vaping Use   Vaping status: Never Used  Substance Use Topics   Alcohol use: Yes    Alcohol/week: 14.0 standard drinks of alcohol    Types: 14 Cans of beer per week   Drug use: Never    Mr.Hemmer reports that he quit smoking about 27 years ago. His smoking use included cigarettes. He started smoking about 62 years ago. He has a 35 pack-year smoking history. He has been exposed to tobacco smoke. He has never used smokeless tobacco. He reports current alcohol use of about 14.0 standard drinks of alcohol per week. He reports that he does not use drugs.  Tobacco Cessation: Counseling given: Not Answered Former smoker, quit 1998 with a 27 pack year smoking   Past surgical hx, Family hx, Social hx all reviewed.  Current Outpatient Medications on File Prior to Visit  Medication Sig   albuterol  (VENTOLIN  HFA) 108 (90 Base) MCG/ACT inhaler Inhale 2 puffs into the lungs every 6 (six) hours as needed for wheezing or shortness of breath.   aspirin  EC 81 MG tablet Take 81 mg by mouth daily.   atorvastatin  (LIPITOR ) 20 MG tablet Take 1 tablet (20 mg total) by mouth daily.   calcium   citrate (CALCITRATE - DOSED IN MG ELEMENTAL CALCIUM ) 950 (200 Ca) MG tablet Take 600 mg of elemental calcium  by mouth daily.   cholecalciferol (VITAMIN D3) 25 MCG (1000 UNIT) tablet Take 800 Units by mouth daily.   dutasteride  (AVODART ) 0.5 MG capsule Take 1 capsule (0.5 mg total) by mouth daily.   fish oil-omega-3 fatty acids  1000 MG capsule Take 2 g by mouth daily.    levothyroxine  (SYNTHROID ) 75 MCG tablet Take 1 tablet (75 mcg total) by mouth daily before breakfast.   lisinopril -hydrochlorothiazide  (ZESTORETIC ) 10-12.5 MG tablet Take 1 tablet by mouth daily.   Multiple Vitamin (MULTIVITAMIN WITH MINERALS) TABS tablet Take 1 tablet by mouth daily.   nitroGLYCERIN  (NITROSTAT ) 0.4 MG SL tablet PLACE 1 TABLET UNDER THE TONGUE EVERY 5 MINUTES AS NEEDED   omeprazole  (PRILOSEC) 40 MG capsule Take 1 capsule (40 mg total) by mouth 2 (two) times daily.   sertraline  (ZOLOFT ) 100 MG tablet Take 1.5 tablets (150 mg total) by mouth daily.   No current facility-administered medications on file prior to visit.     Allergies  Allergen Reactions   Dextromorphan Hydrochloride [Dextromethorphan]     Dizzy, nervous, mental clouding.    Codeine Anxiety    Reaction if taken for extended periods of time.    Review Of Systems:  Constitutional:   No  weight loss, night sweats,  Fevers, chills, fatigue, or  lassitude.  HEENT:   No headaches,  Difficulty swallowing,  Tooth/dental problems, or  Sore throat,                No sneezing, itching, ear ache, nasal congestion, post nasal drip,   CV:  No chest pain,  Orthopnea, PND, swelling in lower extremities, anasarca, dizziness, palpitations, syncope. + chest pain with cough  GI  No heartburn, indigestion, abdominal pain, nausea, vomiting, diarrhea, change in  bowel habits, loss of appetite, bloody stools.   Resp: + shortness of breath with exertion less at rest.  No excess mucus, no productive cough,  + non-productive cough,  No coughing up of blood.  No  change in color of mucus.  No wheezing.  No chest wall deformity  Skin: no rash or lesions.  GU: no dysuria, change in color of urine, no urgency or frequency.  No flank pain, no hematuria   MS:  No joint pain or swelling.  No decreased range of motion.  No back pain.  Psych:  No change in mood or affect. No depression or anxiety.  No memory loss.   Vital Signs BP 123/72   Pulse 72   Temp 98 F (36.7 C) (Oral)   Ht 6' 1 (1.854 m)   Wt 172 lb 3.2 oz (78.1 kg)   SpO2 95%   BMI 22.72 kg/m    Physical Exam:  General- No distress,  A&Ox3, pleasant ENT: No sinus tenderness, TM clear, pale nasal mucosa, no oral exudate,no post nasal drip, no LAN Cardiac: S1, S2, regular rate and rhythm, no murmur Chest: No wheeze/ rales/ dullness; no accessory muscle use, no nasal flaring, no sternal retractions, crackles bilaterally per bases Abd.: Soft Non-tender, ND, BS +, Body mass index is 22.72 kg/m.  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 Previous medications not tolerated.  Awaiting new drug with better side effect profile. - Order high resolution CT to assess current status of pulmonary fibrosis. - Order pulmonary function test to establish a new baseline. - Increase Trelegy to 200 mcg, one puff daily, and provide samples to start immediately. - Schedule follow-up with pulmonary specialists, Dr. Darel or Dr. Tonna, whichever is available first.  Dyspnea on exertion and dry cough associated with idiopathic pulmonary fibrosis Increased dyspnea on exertion and dry cough likely related to idiopathic pulmonary fibrosis. Albuterol   provides some relief, but recent episodes more severe. - Increase Trelegy to 200 mcg, one puff daily. - Use albuterol  nebulizer as needed for acute episodes of dyspnea. - Order six-minute walk test to assess exercise tolerance and oxygen saturation during exertion.  Atypical chest pain associated with cough and idiopathic pulmonary fibrosis Atypical chest pain with cough likely due to inflammation of rib cartilage. Pain persistent but not severe. - Recommend trial of ibuprofen for potential inflammatory component of chest pain. - Order high resolution CT to evaluate for any other potential causes of chest pain.  I spent 40 minutes dedicated to the care of this patient on the date of this encounter to include pre-visit review of records, face-to-face time with the patient discussing conditions above, post visit ordering of testing, clinical documentation with the electronic health record, making appropriate referrals as documented, and communicating necessary information to the patient's healthcare team.      Lauraine JULIANNA Lites, NP 03/27/2024  2:22 PM

## 2024-03-27 NOTE — Patient Instructions (Signed)
 It is good to see you today. I am sorry you have been more short of breath. We will increase your Trelegy to 200 mg. Take 1 puff daily Rinse mouth after use.  I have ordered PFT's, 6 minute walk and HRCT to evaluate for progression of disease. You will get calls to get these scheduled.  I have sent in albuterol  nebs . Use 3 cc's in a nebulizer machine up to every 6 hours as needed for shortness of breath  Follow up with Dr. Geronimo or Dr. Theophilus ( whoever has the first available) to review PFT's, HRCT and 6 minute walk. Please contact office for sooner follow up if symptoms do not improve or worsen or seek emergency care

## 2024-04-06 ENCOUNTER — Ambulatory Visit (HOSPITAL_BASED_OUTPATIENT_CLINIC_OR_DEPARTMENT_OTHER)
Admission: RE | Admit: 2024-04-06 | Discharge: 2024-04-06 | Disposition: A | Discharge status: Home / Self Care | Source: Ambulatory Visit | Attending: Acute Care | Admitting: Acute Care

## 2024-04-06 DIAGNOSIS — J849 Interstitial pulmonary disease, unspecified: Secondary | ICD-10-CM | POA: Insufficient documentation

## 2024-04-11 ENCOUNTER — Ambulatory Visit (INDEPENDENT_AMBULATORY_CARE_PROVIDER_SITE_OTHER): Admitting: *Deleted

## 2024-04-11 DIAGNOSIS — J849 Interstitial pulmonary disease, unspecified: Secondary | ICD-10-CM

## 2024-04-11 LAB — PULMONARY FUNCTION TEST
DL/VA % pred: 47 %
DL/VA: 1.84 ml/min/mmHg/L
DLCO cor % pred: 39 %
DLCO cor: 10.03 ml/min/mmHg
DLCO unc % pred: 39 %
DLCO unc: 10.03 ml/min/mmHg
FEF 25-75 Pre: 0.78 L/s
FEF2575-%Pred-Pre: 36 %
FEV1-%Pred-Pre: 56 %
FEV1-Pre: 1.74 L
FEV1FVC-%Pred-Pre: 71 %
FEV6-%Pred-Pre: 82 %
FEV6-Pre: 3.3 L
FEV6FVC-%Pred-Pre: 104 %
FVC-%Pred-Pre: 78 %
FVC-Pre: 3.36 L
Pre FEV1/FVC ratio: 52 %
Pre FEV6/FVC Ratio: 98 %
RV % pred: 77 %
RV: 2.07 L
TLC % pred: 87 %
TLC: 6.34 L

## 2024-04-11 NOTE — Progress Notes (Signed)
Spirometry, DLCO and lung volumes performed today. ?

## 2024-04-11 NOTE — Patient Instructions (Signed)
Spirometry, DLCO and lung volumes performed today. ?

## 2024-04-17 ENCOUNTER — Ambulatory Visit: Admitting: Internal Medicine

## 2024-04-17 DIAGNOSIS — J849 Interstitial pulmonary disease, unspecified: Secondary | ICD-10-CM

## 2024-04-17 DIAGNOSIS — R0609 Other forms of dyspnea: Secondary | ICD-10-CM

## 2024-04-18 NOTE — Progress Notes (Signed)
 Six Minute Walk - 04/17/24 1418       Six Minute Walk   Medications taken before test (dose and time) Dutasteride  0.5 mg, trelegy Ellipta  200/62.5/25mcg/ACT, Levothyroxine  sodium 75 mcg, Lisinopril -HCTZ 10/12.5 mg, Omega-3 fatty acids  1000 mg, omeprazole  40 mg, sertraline  150 mg    Supplemental oxygen during test? No    Lap distance in meters  34 meters    Laps Completed 20    Partial lap (in meters) 0 meters    Baseline BP (sitting) 152/70    Baseline Heartrate 69    Baseline Dyspnea (Borg Scale) 2    Baseline Fatigue (Borg Scale) 3    Baseline SPO2 94 %      End of Test Values    BP (sitting) 144/87    Heartrate 79    Dyspnea (Borg Scale) 3    Fatigue (Borg Scale) 3    SPO2 91 %      2 Minutes Post Walk Values   BP (sitting) 123/79    Heartrate 76    SPO2 96 %    Stopped or paused before six minutes? No      Interpretation   Distance completed 680 meters    Tech Comments: Patient walked at an average pace.  No stops for breaks/rest.  At the end of the test his sats were 87-88% on RA, he recovered quickly, in less than 1 minute.

## 2024-04-19 ENCOUNTER — Other Ambulatory Visit: Payer: Self-pay | Admitting: Nurse Practitioner

## 2024-04-19 DIAGNOSIS — I10 Essential (primary) hypertension: Secondary | ICD-10-CM

## 2024-04-19 DIAGNOSIS — E039 Hypothyroidism, unspecified: Secondary | ICD-10-CM

## 2024-04-19 DIAGNOSIS — K219 Gastro-esophageal reflux disease without esophagitis: Secondary | ICD-10-CM

## 2024-04-23 ENCOUNTER — Ambulatory Visit (INDEPENDENT_AMBULATORY_CARE_PROVIDER_SITE_OTHER): Admitting: Internal Medicine

## 2024-04-23 ENCOUNTER — Telehealth: Payer: Self-pay | Admitting: Internal Medicine

## 2024-04-23 ENCOUNTER — Encounter: Payer: Self-pay | Admitting: Internal Medicine

## 2024-04-23 VITALS — BP 116/68 | HR 68 | Temp 98.2°F | Ht 73.0 in | Wt 171.6 lb

## 2024-04-23 DIAGNOSIS — J439 Emphysema, unspecified: Secondary | ICD-10-CM | POA: Diagnosis not present

## 2024-04-23 DIAGNOSIS — J84112 Idiopathic pulmonary fibrosis: Secondary | ICD-10-CM

## 2024-04-23 DIAGNOSIS — Z5181 Encounter for therapeutic drug level monitoring: Secondary | ICD-10-CM

## 2024-04-23 DIAGNOSIS — J849 Interstitial pulmonary disease, unspecified: Secondary | ICD-10-CM

## 2024-04-23 DIAGNOSIS — R053 Chronic cough: Secondary | ICD-10-CM

## 2024-04-23 DIAGNOSIS — R0609 Other forms of dyspnea: Secondary | ICD-10-CM

## 2024-04-23 DIAGNOSIS — K449 Diaphragmatic hernia without obstruction or gangrene: Secondary | ICD-10-CM

## 2024-04-23 DIAGNOSIS — Z87891 Personal history of nicotine dependence: Secondary | ICD-10-CM

## 2024-04-23 DIAGNOSIS — R0902 Hypoxemia: Secondary | ICD-10-CM

## 2024-04-23 LAB — CBC WITH DIFFERENTIAL/PLATELET
Basophils Absolute: 0 K/uL (ref 0.0–0.1)
Basophils Relative: 0.2 % (ref 0.0–3.0)
Eosinophils Absolute: 0.1 K/uL (ref 0.0–0.7)
Eosinophils Relative: 1.5 % (ref 0.0–5.0)
HCT: 45.1 % (ref 39.0–52.0)
Hemoglobin: 15.1 g/dL (ref 13.0–17.0)
Lymphocytes Relative: 12.1 % (ref 12.0–46.0)
Lymphs Abs: 1 K/uL (ref 0.7–4.0)
MCHC: 33.6 g/dL (ref 30.0–36.0)
MCV: 89.3 fl (ref 78.0–100.0)
Monocytes Absolute: 0.8 K/uL (ref 0.1–1.0)
Monocytes Relative: 9.4 % (ref 3.0–12.0)
Neutro Abs: 6.5 K/uL (ref 1.4–7.7)
Neutrophils Relative %: 76.8 % (ref 43.0–77.0)
Platelets: 213 K/uL (ref 150.0–400.0)
RBC: 5.05 Mil/uL (ref 4.22–5.81)
RDW: 14.1 % (ref 11.5–15.5)
WBC: 8.4 K/uL (ref 4.0–10.5)

## 2024-04-23 NOTE — Progress Notes (Signed)
 09/29/2022 78 year old male, former smoker.  Past medical history significant for COPD, asthma, GERD, hiatal hernia status post Nissen's fundoplication.  Patient is followed by Russell Brown.  He was originally referred due to CT finding showing pulmonary fibrosis, unable to tolerate Ofev  discontinued November 2023.   Patient presents today for follow-up.  He feels his breathing has been worse over the last several months and his wife has noticed decline in his physical condition over the last year. He attended pulmonary rehab for 5-6 weeks but had to stop early d/t family crisis He has a treadmill at home and he likes to walk outside.     03/27/2024 Discussed the use of AI scribe software for clinical note transcription with the patient, who gave verbal consent to proceed.  History of Present Illness Pt. Presents for an acute OV. He was last seen in this office 09/2022 by Russell Brown. No Chest imaging since 08/06/2022.  Pt. Did have a recent Calcium  Scoring scan 10/29/2023, which had an over read of Severe COPD and fibrotic lung disease. No active disease.Small hiatal hernia.  He presents today for worsening shortness of breath with minimal exertion. He has not been treating his ILD due to the fact both Esbriet  and Ofev  were intolerable to him.  He lost approximately 30 pounds.  He has been using his Trelegy 100 daily.  He does state that this helps with his shortness of breath. Initially, exertion such as hiking or climbing stairs would cause shortness of breath, but now even minimal exertion, like walking from one room to another, results in significant breathlessness. In July, he had a severe attack while working outside, leading to collapse and requiring the use of an albuterol  inhaler for relief. Another similar episode occurred recently while loading his truck, where the inhaler provided relief  Over the last several months he has noticed worsening dry Brown, worsening dyspnea.   We discussed very openly that this is most likely related to progression of his ILD.  We discussed that we need to get new baselines, I have ordered HRCT, pulmonary function testing, and 6-minute walk.  I have also increased his Trelegy to 200 from 100, and have added albuterol  nebs.  He states when he gets out of breath that the albuterol  does help him.  We had a long discussion about how he is straining his heart when his oxygen levels dropped with exertion, and how important it is to treat what we can to keep him as healthy as possible.  We did discuss that there is a new drug for fibrosis that we will be coming out soon that has a better side effect profile.  I have encouraged him to consider trying this.  He also asked about stem cell treatment to improve his ILD.  I advised him to talk to either Russell Brown or Russell Brown regarding this as they would most likely know if there have been any studies that show promising outcomes.  Patient will return to the clinic once PFTs, HRCT, and 6-minute walk been completed to restage his disease for progression and to discuss new options for treatment.  We did discuss that the patient may need oxygen with ambulation.  He said if his 6-minute walk shows he needs that he is up and to the idea.  He was very open with me and told me that he is only here today because of his wife, and his family are asking him to seek care.  He does have considerable crackles in his bases and midway up his back.  He does have a dry nonproductive Brown, and some pain in his sternum when he coughs.  No fever, discolored secretions.   Test Results: 10/29/2023 CT Coronary Morph w/ CTA COR w/Score Heart is normal size. Aorta normal caliber. Small hiatal hernia. No adenopathy. Severe centrilobular emphysema and fibrotic lung disease, unchanged since prior study. No acute confluent opacities or effusions. No acute findings in the upper abdomen. Chest wall soft tissues are unremarkable.  No acute bony abnormality.   IMPRESSION: Severe COPD and fibrotic lung disease.  08/06/2022 CT chest No acute consolidative airspace disease to suggest a pneumonia. Severe centrilobular emphysema with mild diffuse bronchial wall thickening, suggesting COPD. 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 No appreciable interval progression. Two-vessel coronary atherosclerosis.Small to moderate hiatal hernia. Diffuse osteopenia. Aortic Atherosclerosis (ICD10-I70.0) and Emphysema (ICD10-J43.9).    OV 04/23/2024-transfer of care to see Russell Brown in the ILD center.  Subjective:  Patient ID: Russell Brown, male , DOB: 1945/07/25 , age 81 y.o. , MRN: [id number] , ADDRESS: [address] [place] [address] PCP Nche, Roselie Rockford, Brown Patient Care Team: Nche, Roselie Rockford, Brown as PCP - General (Internal Medicine) Russell Redell RAMAN, Brown as PCP - Cardiology (Cardiology) Russell Cough, Brown as Consulting Physician (Urology) Mannam, Praveen, Brown as Consulting Physician (Pulmonary Disease)  This Provider for this visit: Treatment Team:  Attending Provider: Geronimo Amel, Brown    04/23/2024 -   Chief Complaint  Patient presents with   Interstitial Lung Disease    Pt states since LOV breathing has been the same 02 level drops when moving around, lowest 02 has dropped (82) SOB occurs with any activity  Dry Brown      HPI Russell Brown 78 y.o.   Russell Brown is a 78 year old male with pulmonary fibrosis who presents with worsening shortness of breath. He is accompanied by his wife, Russell Brown.  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   He has experienced progressive worsening of his breathing over the past year, with significant deterioration  in the last two to three months. He enjoys walking, particularly in wooded areas, but his current respiratory limitations have significantly impacted this activity. He now experiences labored breathing even on flat ground, requiring support. Previously, he only became winded when climbing stairs or engaging in physical activities like hiking, which he has not done in years. He does not use oxygen but reports a significant drop in oxygen levels [place] where it fell to 82. During a six-minute walk test conducted last week, his oxygen level dropped to 87.  He has previously been treated with Esbriet  and Ofev , both of which he discontinued due to severe side effects, including significant weight loss and severe diarrhea. He has been on Trelegy for about a year, initially at 100 strength, which was later increased to 200 [most recent visit with Lauraine Schultze per external record review]., but he has not noticed any improvement.  He suffers from severe acid reflux and a hiatal hernia, which he believes contributes to his pulmonary issues. He underwent surgery for this condition in 1991, which provided relief for about nine years. He currently manages his symptoms with omeprazole  twice daily and dietary modifications,  although he still experiences hoarseness and frequent throat clearing.  He has a persistent dry Brown, which he attributes to his fibrosis. He has not undergone recent allergy testing, although he sees a dermatologist   His high-resolution CT chest October 2025 shows worsening ILD/IPF. His last echocardiogram was in May 2024.  He has upcoming appoint with Dr. Pietro.   His and his wife's main concerns - Dyspnea relief; currently not using night or portable oxygen. - Brown relief - Address primary health issues of fibrosis and associated emphysema. =  SYMPTOM SCALE - ILD 04/23/2024  Current weight   O2 use ra  Shortness of Breath 0 -> 5 scale with 5 being worst  (score 6 If unable to do)  At rest 2  Simple tasks - showers, clothes change, eating, shaving 3  Household (dishes, doing bed, laundry) 3  Shopping 2  Walking level at own pace 2  Walking up Stairs 5  Total (30-36) Dyspnea Score 17  How bad is your Brown? 2  How bad is your fatigue 2  How bad is appetiee 1  How bad is nausea 1  How bad is vomiting?  1  How bad is diarrhea? 1  How bad is anxiety? 2  How bad is depression 1  Any chronic pain - if so where and how bad 1       Narrative & Impression personally visualized the CT chest and agree with the findings.  October 2025.  CLINICAL DATA:  Interstitial lung disease. Worsening shortness of breath with exertion. Central chest pain, Brown.   EXAM: CT CHEST WITHOUT CONTRAST   TECHNIQUE: Multidetector CT imaging of the chest was performed following the standard protocol without intravenous contrast. High resolution imaging of the lungs, as well as inspiratory and expiratory imaging, was performed.   RADIATION DOSE REDUCTION: This exam was performed according to the departmental dose-optimization program which includes automated exposure control, adjustment of the mA and/or kV according to patient size and/or use of iterative reconstruction technique.   COMPARISON:  08/06/2022.   FINDINGS: Cardiovascular: Atherosclerotic calcification of the aorta, aortic valve and coronary arteries. Heart is at the upper limits of normal in size. No pericardial effusion.   Mediastinum/Nodes: Right thyroidectomy. No pathologically enlarged mediastinal or axillary lymph nodes. Hilar regions are difficult to definitively evaluate without IV contrast. Esophagus is grossly unremarkable.   Lungs/Pleura: Centrilobular and paraseptal emphysema. Basilar predominant subpleural reticular densities and traction bronchiolectasis, minimally progressive from 08/06/2022. No pleural fluid. Airway is unremarkable. No air trapping.   Upper Abdomen:  Gallstone. Moderate hiatal hernia. Surgical clips in the left upper quadrant. Visualized portions of the liver, gallbladder, adrenal glands, kidneys, spleen, pancreas, stomach and bowel are otherwise grossly unremarkable. No upper abdominal adenopathy.   Musculoskeletal: Osteopenia. Degenerative changes in the spine. Slight compression of the T12 vertebral body, unchanged.   IMPRESSION: 1. Pulmonary parenchymal pattern of interstitial lung disease, as detailed above, minimally progressive from 08/06/2022. Findings are categorized as probable UIP per consensus guidelines: Diagnosis of Idiopathic Pulmonary Fibrosis: An Official ATS/ERS/JRS/ALAT Clinical Practice Guideline. Am JINNY [employer] Crit Care Med Vol 198, Iss 5, [phone], Feb 19 2017. 2. Cholelithiasis. 3. Moderate hiatal hernia. 4. Aortic atherosclerosis (ICD10-I70.0). Coronary artery calcification. 5.  Emphysema (ICD10-J43.9).     Electronically Signed   By: Newell Eke M.D.   On: 04/06/2024 13:35     Latest Reference Range & Units 07/04/20 11:33 06/05/21 09:39  Anti Nuclear Antibody (ANA) NEGATIVE  Negative POSITIVE !  ANA Pattern  1   Nuclear, Homogeneous !  ANA Titer 1 titer  1:40 (H)  Cyclic Citrullin Peptide Ab UNITS  <16  RA Latex Turbid. <14 IU/mL <14 <14  !: Data is abnormal (H): Data is abnormally high    PFT     Latest Ref Rng & Units 04/11/2024    1:25 PM 09/29/2022    9:54 AM 08/12/2021   11:49 AM 05/28/2020   11:47 AM  PFT Results  FVC-Pre L 3.36  3.51  3.53  3.60   FVC-Predicted Pre % 78  74  74  75   FVC-Post L  3.70  3.68  3.98   FVC-Predicted Post %  78  77  83   Pre FEV1/FVC % % 52  49  53  53   Post FEV1/FCV % %  52  54  51   FEV1-Pre L 1.74  1.71  1.85  1.90   FEV1-Predicted Pre % 56  50  53  54   FEV1-Post L  1.91  2.00  2.04   DLCO uncorrected ml/min/mmHg 10.03  11.77  13.26  13.73   DLCO UNC% % 39  43  48  49   DLCO corrected ml/min/mmHg 10.03  11.32  13.26  13.73   DLCO COR  %Predicted % 39  41  48  49   DLVA Predicted % 47  54  60  59   TLC L 6.34  6.73  5.72  6.97   TLC % Predicted % 87  87  74  91   RV % Predicted % 77  124  61  97     ECHO MAY 2024  chocardiogram showed normal pulmonary artery pressure. No needs for heart cath right now. Evidence of grade 2 diastolic heart dysfunction (heart failure) which could be contributing to shortness of breath. You have severe emphysema and fibrotic lung disease on CT imaging which is stable.   LAB RESULTS last 96 hours No results found.       has a past medical history of Arthritis, Asthma, CAP (community acquired pneumonia) (03/03/2012), COPD (chronic obstructive pulmonary disease) (HCC), GERD (gastroesophageal reflux disease), History of hiatal hernia, Hyperlipemia, Hypertension, Hypothyroidism, Pneumonia (2013; 2017), Preventative health care (12/06/2016), PTSD (post-traumatic stress disorder), Pulmonary fibrosis (HCC), Sleep apnea, Throat burning (08/03/2023), and Thyroid  disease.   reports that he quit smoking about 27 years ago. His smoking use included cigarettes. He started smoking about 62 years ago. He has a 35 pack-year smoking history. He has been exposed to tobacco smoke. He has never used smokeless tobacco.  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; table saw injury   FOOT FRACTURE SURGERY Left    heel OR; put 2 steel rods in and took them out 6 wks later   FRACTURE SURGERY     HERNIA REPAIR Right 1965   LEFT HEART CATH AND CORONARY ANGIOGRAPHY N/A 02/21/2018   Procedure: LEFT HEART CATH AND CORONARY ANGIOGRAPHY;  Surgeon: Burnard Debby LABOR, Brown;  Location: [facility];  Service: Cardiovascular;  Laterality: N/A;   NASAL SEPTUM SURGERY  1986   NISSEN  FUNDOPLICATION  1990s   w/hernia repair   THYROIDECTOMY, PARTIAL  2000s  WISDOM TOOTH EXTRACTION      Allergies  Allergen Reactions   Dextromorphan Hydrochloride [Dextromethorphan]     Dizzy, nervous, mental clouding.    Codeine Anxiety    Reaction if taken for extended periods of time.    Immunization History  Administered Date(s) Administered   Fluad Quad(high Dose 65+) 03/16/2019   Fluad Trivalent(High Dose 65+) 03/13/2024   H1N1 05/28/2008   INFLUENZA, HIGH DOSE SEASONAL PF 03/05/2014, 03/17/2015, 03/17/2018, 03/26/2019, 03/05/2022   Influenza Whole 04/28/2007, 03/26/2008, 04/16/2009, 05/07/2010, 04/10/2011, 04/13/2012   Influenza, Seasonal, Injecte, Preservative Fre 03/06/2013   Influenza,inj,Quad PF,6+ Mos 03/04/2016   Influenza-Unspecified 03/22/2011, 03/11/2020, 03/27/2021, 02/28/2023   Moderna Covid-19 Vaccine Bivalent Booster 12yrs & up 04/06/2021   Moderna SARS-COV2 Booster Vaccination 05/05/2020   Moderna Sars-Covid-2 Vaccination 08/06/2019, 09/04/2019, 10/23/2020, 03/19/2022   Pneumococcal Conjugate-13 12/05/2014   Pneumococcal Polysaccharide-23 04/11/2008, 04/18/2013, 03/16/2019   Td 05/22/2009, 02/16/2011   Tdap 05/05/2022   Zoster Recombinant(Shingrix) 03/21/2017, 09/10/2017   Zoster, Live 05/22/2009    Family History  Problem Relation Age of Onset   Parkinsonism Mother    Osteoporosis Mother    Pneumonia Mother        31   Cancer Father        prostate,  skin   Hyperlipidemia Father    Hypertension Father    Prostate cancer Father    Asthma Son    Osteoporosis Paternal Aunt    Alzheimer's disease Paternal Uncle    Osteoporosis Maternal Grandmother    Cancer Maternal Grandmother        lung   Heart disease Paternal Grandmother        MI   Heart disease Paternal Grandfather        MI   Colon cancer Neg Hx    Pancreatic cancer Neg Hx    Stomach cancer Neg Hx    Esophageal cancer Neg Hx    Liver cancer Neg Hx    Rectal cancer Neg Hx       Current Outpatient Medications:    albuterol  (PROVENTIL ) (2.5 MG/3ML) 0.083% nebulizer solution, Take 3 mLs (2.5 mg total) by nebulization every 6 (six) hours as needed for wheezing or shortness of breath., Disp: 75 mL, Rfl: 12   albuterol  (VENTOLIN  HFA) 108 (90 Base) MCG/ACT inhaler, Inhale 2 puffs into the lungs every 6 (six) hours as needed for wheezing or shortness of breath., Disp: 8 g, Rfl: 2   aspirin  EC 81 MG tablet, Take 81 mg by mouth daily., Disp: , Rfl:    atorvastatin  (LIPITOR ) 20 MG tablet, Take 1 tablet (20 mg total) by mouth daily., Disp: 90 tablet, Rfl: 3   calcium  citrate (CALCITRATE - DOSED IN MG ELEMENTAL CALCIUM ) 950 (200 Ca) MG tablet, Take 600 mg of elemental calcium  by mouth daily., Disp: , Rfl:    cholecalciferol (VITAMIN D3) 25 MCG (1000 UNIT) tablet, Take 800 Units by mouth daily., Disp: , Rfl:    dutasteride  (AVODART ) 0.5 MG capsule, Take 1 capsule (0.5 mg total) by mouth daily., Disp: 90 capsule, Rfl: 3   fish oil-omega-3 fatty acids  1000 MG capsule, Take 2 g by mouth daily. , Disp: , Rfl:    Fluticasone -Umeclidin-Vilant (TRELEGY ELLIPTA ) 200-62.5-25 MCG/ACT AEPB, Inhale 1 each into the lungs daily., Disp: 1 each, Rfl: 12   Fluticasone -Umeclidin-Vilant (TRELEGY ELLIPTA ) 200-62.5-25 MCG/ACT AEPB, Inhale 1 puff into the lungs daily at 2 PM., Disp: , Rfl:    levothyroxine  (SYNTHROID ) 75 MCG tablet, TAKE 1 TABLET BY MOUTH DAILY  BEFORE BREAKFAST, Disp: 90 tablet, Rfl: 3   lisinopril -hydrochlorothiazide  (ZESTORETIC ) 10-12.5 MG tablet, TAKE 1 TABLET BY MOUTH DAILY, Disp: 90 tablet, Rfl: 3   Multiple Vitamin (MULTIVITAMIN WITH MINERALS) TABS tablet, Take 1 tablet by mouth daily., Disp: , Rfl:    nitroGLYCERIN  (NITROSTAT ) 0.4 MG SL tablet, PLACE 1 TABLET UNDER THE TONGUE EVERY 5 MINUTES AS NEEDED, Disp: 25 tablet, Rfl: 0   omeprazole  (PRILOSEC) 40 MG capsule, TAKE 1 CAPSULE BY MOUTH TWICE  DAILY, Disp: 180 capsule, Rfl: 3   sertraline  (ZOLOFT ) 100 MG tablet, Take 1.5  tablets (150 mg total) by mouth daily., Disp: 135 tablet, Rfl: 3      Objective:   Vitals:   04/23/24 1123  BP: 116/68  Pulse: 68  Temp: 98.2 F (36.8 C)  TempSrc: Oral  SpO2: 93%  Weight: 171 lb 9.6 oz (77.8 kg)  Height: 6' 1 (1.854 m)    Estimated body mass index is 22.64 kg/m as calculated from the following:   Height as of this encounter: 6' 1 (1.854 m).   Weight as of this encounter: 171 lb 9.6 oz (77.8 kg).  @WEIGHTCHANGE @  Filed Weights   04/23/24 1123  Weight: 171 lb 9.6 oz (77.8 kg)     Physical Exam   General: No distress. Lookk well. Lean O2 at rest: no Cane present: no Sitting in wheel chair: no Frail: no Obese: no Neuro: Alert and Oriented x 3. GCS 15. Speech normal Psych: Pleasant Resp:  Barrel Chest - no.  Wheeze - no, Crackles - YES BASE, No overt respiratory distress CVS: Normal heart sounds. Murmurs - no Ext: Stigmata of Connective Tissue Disease - no ABD - scare of hiatal hernia + HEENT: Normal upper airway. PEERL +. No post nasal drip        Assessment/     Assessment & Plan ILD (interstitial lung disease) (HCC)  IPF (idiopathic pulmonary fibrosis) (HCC)  Encounter for therapeutic drug monitoring  Emphysema lung (HCC)  Exercise hypoxemia  Dyspnea on exertion  Chronic Brown  Hiatal hernia    PLAN Patient Instructions      ICD-10-CM   1. IPF (idiopathic pulmonary fibrosis) (HCC)  J84.112     2. ILD (interstitial lung disease) (HCC)  J84.9     3. Encounter for therapeutic drug monitoring  Z51.81     4. Emphysema lung (HCC)  J43.9     5. Exercise hypoxemia  R09.02     6. Dyspnea on exertion  R06.09     7. Chronic Brown  R05.3      IPF (idiopathic pulmonary fibrosis) (HCC) ILD (interstitial lung disease) (HCC) Encounter for therapeutic drug monitoring  IPF is getting worse  Plan - start JASCAYD anti fibrotic   - *Lower side effect profile than the previous antifibrotic but if you have side effects with  this we will stop this  Emphysema lung (HCC)  Plan  - Continue Trelegy but he can go back down to 100 strength  Exercise hypoxemia Dyspnea on exertion  -Showed desaturation to 87% on 6-minute walk test  -Plan - Start portable oxygen 2 L nasal cannula for daily exertion - Check overnight pulse oximetry - Get echocardiogram - Would likely need right heart catheterization and I will communicate this with Dr. Pietro  Hiatal Hernia  - risk facto for IPF  Plan  - continue your prior excellent measures in conrolling GERD  Chronic Brown  -This can be a heart problem to treat but we will see if  you have any allergies contributing to her Brown  Plan - Check CBC differential and RAST allergy panel   Follow-up - 4-6 weeks with nurse practitioner 30-minute visit -8-12 weeks with Russell Brown BOSCH    Return for 4-6 weeks with nurse practitioner in 8-12 weeks with Russell Brown.   ( Level 05 visit E&M 2024: Estb >= 40 min in  visit type: on-site physical face to visit  in total care time and counseling or/and coordination of care by this undersigned Brown - Dr Dorethia Russell. This includes one or more of the following on this same day 04/23/2024: pre-charting, chart review, note writing, documentation discussion of test results, diagnostic or treatment recommendations, prognosis, risks and benefits of management options, instructions, education, compliance or risk-factor reduction. It excludes time spent by the CMA or office staff in the care of the patient. Actual time 45 min)    SIGNATURE    Dr. Dorethia Russell, M.D., F.C.C.P,  Pulmonary and Critical Care Medicine Staff Physician, [facility] Director - Interstitial Lung Disease  Program  [facility] at [facility] [place], [place], [address]  Pager: [phone], If no answer or between  15:00h - 7:00h: call [phone] Telephone: [phone]  12:36  PM 04/23/2024

## 2024-04-23 NOTE — Patient Instructions (Addendum)
    ICD-10-CM   1. IPF (idiopathic pulmonary fibrosis) (HCC)  J84.112     2. ILD (interstitial lung disease) (HCC)  J84.9     3. Encounter for therapeutic drug monitoring  Z51.81     4. Emphysema lung (HCC)  J43.9     5. Exercise hypoxemia  R09.02     6. Dyspnea on exertion  R06.09     7. Chronic cough  R05.3      IPF (idiopathic pulmonary fibrosis) (HCC) ILD (interstitial lung disease) (HCC) Encounter for therapeutic drug monitoring  IPF is getting worse  Plan - start JASCAYD anti fibrotic   - *Lower side effect profile than the previous antifibrotic but if you have side effects with this we will stop this  Emphysema lung (HCC)  Plan  - Continue Trelegy but he can go back down to 100 strength  Exercise hypoxemia Dyspnea on exertion  -Showed desaturation to 87% on 6-minute walk test  -Plan - Start portable oxygen 2 L nasal cannula for daily exertion - Check overnight pulse oximetry - Get echocardiogram - Would likely need right heart catheterization and I will communicate this with Dr. Pietro  Hiatal Hernia  - risk facto for IPF  Plan  - continue your prior excellent measures in conrolling GERD  Chronic cough  -This can be a heart problem to treat but we will see if you have any allergies contributing to her cough  Plan - Check CBC differential and RAST allergy panel   Follow-up - 4-6 weeks with nurse practitioner 30-minute visit -8-12 weeks with Dr. Geronimo

## 2024-04-23 NOTE — Telephone Encounter (Signed)
 Per Avelina at [employer]- Jackson, Avelina Jackson Easter Linnie, [place]; [place]; Tucker, [place]; Cain, [place]; 1 other this will need to be a new start. We would need the RX updated using O2 template, sats (those have to be co-signed also) to start him on O2.

## 2024-04-24 ENCOUNTER — Other Ambulatory Visit: Payer: Self-pay | Admitting: Internal Medicine

## 2024-04-24 DIAGNOSIS — J849 Interstitial pulmonary disease, unspecified: Secondary | ICD-10-CM

## 2024-04-24 DIAGNOSIS — Z5181 Encounter for therapeutic drug level monitoring: Secondary | ICD-10-CM

## 2024-04-24 DIAGNOSIS — K449 Diaphragmatic hernia without obstruction or gangrene: Secondary | ICD-10-CM

## 2024-04-24 DIAGNOSIS — R0609 Other forms of dyspnea: Secondary | ICD-10-CM

## 2024-04-24 DIAGNOSIS — J439 Emphysema, unspecified: Secondary | ICD-10-CM

## 2024-04-24 DIAGNOSIS — R0902 Hypoxemia: Secondary | ICD-10-CM

## 2024-04-24 DIAGNOSIS — J84112 Idiopathic pulmonary fibrosis: Secondary | ICD-10-CM

## 2024-04-24 DIAGNOSIS — R053 Chronic cough: Secondary | ICD-10-CM

## 2024-04-25 ENCOUNTER — Telehealth: Payer: Self-pay

## 2024-04-25 ENCOUNTER — Other Ambulatory Visit (HOSPITAL_COMMUNITY): Payer: Self-pay

## 2024-04-25 DIAGNOSIS — J84112 Idiopathic pulmonary fibrosis: Secondary | ICD-10-CM

## 2024-04-25 NOTE — Telephone Encounter (Signed)
 Received Jascayd new start paperwork. Submitted a Prior Authorization request to [employer] for JASCAYD via CoverMyMeds. Will update once we receive a response.  Key: [id number]

## 2024-04-26 ENCOUNTER — Ambulatory Visit: Attending: Internal Medicine

## 2024-04-26 ENCOUNTER — Other Ambulatory Visit (HOSPITAL_COMMUNITY): Payer: Self-pay

## 2024-04-26 DIAGNOSIS — J849 Interstitial pulmonary disease, unspecified: Secondary | ICD-10-CM

## 2024-04-26 LAB — ALLERGEN PROFILE, PERENNIAL ALLERGEN IGE
Alternaria Alternata IgE: <0.1 kU/L
Aspergillus Fumigatus IgE: <0.1 kU/L
Aureobasidi Pullulans IgE: <0.1 kU/L
Candida Albicans IgE: <0.1 kU/L
Cat Dander IgE: <0.1 kU/L
Chicken Feathers IgE: <0.1 kU/L
Cladosporium Herbarum IgE: <0.1 kU/L
Cow Dander IgE: <0.1 kU/L
D Farinae IgE: 0.45 kU/L — AB
D Pteronyssinus IgE: 0.41 kU/L — AB
Dog Dander IgE: <0.1 kU/L
Duck Feathers IgE: <0.1 kU/L
Goose Feathers IgE: <0.1 kU/L
Mouse Urine IgE: <0.1 kU/L
Mucor Racemosus IgE: <0.1 kU/L
Penicillium Chrysogen IgE: <0.1 kU/L
Phoma Betae IgE: <0.1 kU/L
Setomelanomma Rostrat: <0.1 kU/L
Stemphylium Herbarum IgE: <0.1 kU/L

## 2024-04-26 MED ORDER — JASCAYD 18 MG PO TABS: 18.0000 mg | ORAL_TABLET | Freq: Two times a day (BID) | ORAL | 1 refills | Status: DC

## 2024-04-26 NOTE — Telephone Encounter (Signed)
 Received notification from [facility] regarding a prior authorization for JASCAYD. Authorization has been APPROVED from 04/25/24 to 06/20/25. Approval letter sent to scan center.  Per test claim, copay for 30 days supply is $0  Patient can fill through [facility]: [phone]   Authorization # [id number] Phone # [phone]

## 2024-04-26 NOTE — Progress Notes (Signed)
 [facility]  Referring Provider: Dr. Geronimo  Virtual Visit via Telephone Note  I connected with Russell Brown on 04/26/24 at 2:20 PM by telephone and verified that I am speaking with the correct person using two identifiers.  Location: Patient: home Provider: office   I discussed the limitations, risks, security and privacy concerns of performing an evaluation and management service by telephone and the availability of in person appointments. I also discussed with the patient that there may be a patient responsible charge related to this service. The patient expressed understanding and agreed to proceed.  Subjective:  Patient called today by [facility] team for Russell Brown new start.   Patient was last seen by Dr. Geronimo on 04/24/24.  Pertinent past medical history includes IPF. Prior therapy includes Ofev  and Esbriet  with significant intolerances to both.    Objective: Allergies  Allergen Reactions   Dextromorphan Hydrochloride [Dextromethorphan]     Dizzy, nervous, mental clouding.    Codeine Anxiety    Reaction if taken for extended periods of time.    Outpatient Encounter Medications as of 04/26/2024  Medication Sig   albuterol  (PROVENTIL ) (2.5 MG/3ML) 0.083% nebulizer solution Take 3 mLs (2.5 mg total) by nebulization every 6 (six) hours as needed for wheezing or shortness of breath.   albuterol  (VENTOLIN  HFA) 108 (90 Base) MCG/ACT inhaler Inhale 2 puffs into the lungs every 6 (six) hours as needed for wheezing or shortness of breath.   aspirin  EC 81 MG tablet Take 81 mg by mouth daily.   atorvastatin  (LIPITOR ) 20 MG tablet Take 1 tablet (20 mg total) by mouth daily.   calcium  citrate (CALCITRATE - DOSED IN MG ELEMENTAL CALCIUM ) 950 (200 Ca) MG tablet Take 600 mg of elemental calcium  by mouth daily.   cholecalciferol (VITAMIN D3) 25 MCG (1000 UNIT) tablet Take 800 Units by mouth daily.   dutasteride  (AVODART ) 0.5 MG capsule Take 1  capsule (0.5 mg total) by mouth daily.   fish oil-omega-3 fatty acids  1000 MG capsule Take 2 g by mouth daily.    Fluticasone -Umeclidin-Vilant (TRELEGY ELLIPTA ) 200-62.5-25 MCG/ACT AEPB Inhale 1 each into the lungs daily.   Fluticasone -Umeclidin-Vilant (TRELEGY ELLIPTA ) 200-62.5-25 MCG/ACT AEPB Inhale 1 puff into the lungs daily at 2 PM.   levothyroxine  (SYNTHROID ) 75 MCG tablet TAKE 1 TABLET BY MOUTH DAILY  BEFORE BREAKFAST   lisinopril -hydrochlorothiazide  (ZESTORETIC ) 10-12.5 MG tablet TAKE 1 TABLET BY MOUTH DAILY   Multiple Vitamin (MULTIVITAMIN WITH MINERALS) TABS tablet Take 1 tablet by mouth daily.   nitroGLYCERIN  (NITROSTAT ) 0.4 MG SL tablet PLACE 1 TABLET UNDER THE TONGUE EVERY 5 MINUTES AS NEEDED   omeprazole  (PRILOSEC) 40 MG capsule TAKE 1 CAPSULE BY MOUTH TWICE  DAILY   sertraline  (ZOLOFT ) 100 MG tablet Take 1.5 tablets (150 mg total) by mouth daily.   No facility-administered encounter medications on file as of 04/26/2024.     Immunization History  Administered Date(s) Administered   Fluad Quad(high Dose 65+) 03/16/2019   Fluad Trivalent(High Dose 65+) 03/13/2024   H1N1 05/28/2008   INFLUENZA, HIGH DOSE SEASONAL PF 03/05/2014, 03/17/2015, 03/17/2018, 03/26/2019, 03/05/2022   Influenza Whole 04/28/2007, 03/26/2008, 04/16/2009, 05/07/2010, 04/10/2011, 04/13/2012   Influenza, Seasonal, Injecte, Preservative Fre 03/06/2013   Influenza,inj,Quad PF,6+ Mos 03/04/2016   Influenza-Unspecified 03/22/2011, 03/11/2020, 03/27/2021, 02/28/2023   Moderna Covid-19 Vaccine Bivalent Booster 77yrs & up 04/06/2021   Moderna SARS-COV2 Booster Vaccination 05/05/2020   Moderna Sars-Covid-2 Vaccination 08/06/2019, 09/04/2019, 10/23/2020, 03/19/2022   Pneumococcal Conjugate-13 12/05/2014   Pneumococcal Polysaccharide-23 04/11/2008,  04/18/2013, 03/16/2019   Td 05/22/2009, 02/16/2011   Tdap 05/05/2022   Zoster Recombinant(Shingrix) 03/21/2017, 09/10/2017   Zoster, Live 05/22/2009      PFT's TLC   Date Value Ref Range Status  04/11/2024 6.34 L Final      CMP     Component Value Date/Time   NA 139 11/10/2023 0956   K 4.2 11/10/2023 0956   CL 102 11/10/2023 0956   CO2 29 11/10/2023 0956   GLUCOSE 99 11/10/2023 0956   GLUCOSE 98 04/22/2006 1057   BUN 14 11/10/2023 0956   CREATININE 0.93 11/10/2023 0956   CALCIUM  9.1 11/10/2023 0956   PROT 6.7 05/13/2023 1018   ALBUMIN 4.5 05/13/2023 1018   AST 18 05/13/2023 1018   ALT 18 05/13/2023 1018   ALKPHOS 81 05/13/2023 1018   BILITOT 0.5 05/13/2023 1018   GFRNONAA >60 04/26/2018 0913   GFRAA >60 04/26/2018 0913      CBC    Component Value Date/Time   WBC 8.4 04/23/2024 1229   RBC 5.05 04/23/2024 1229   HGB 15.1 04/23/2024 1229   HCT 45.1 04/23/2024 1229   PLT 213.0 04/23/2024 1229   MCV 89.3 04/23/2024 1229   MCH 30.0 04/26/2018 0913   MCHC 33.6 04/23/2024 1229   RDW 14.1 04/23/2024 1229   LYMPHSABS 1.0 04/23/2024 1229   MONOABS 0.8 04/23/2024 1229   EOSABS 0.1 04/23/2024 1229   BASOSABS 0.0 04/23/2024 1229      LFT's    Latest Ref Rng & Units 05/13/2023   10:18 AM 08/05/2022    9:53 AM 04/08/2022    9:33 AM  Hepatic Function  Total Protein 6.0 - 8.3 g/dL 6.7  6.5  6.7   Albumin 3.5 - 5.2 g/dL 4.5  4.3  4.3   AST 0 - 37 U/L 18  14  18    ALT 0 - 53 U/L 18  19  20    Alk Phosphatase 39 - 117 U/L 81  63  75   Total Bilirubin 0.2 - 1.2 mg/dL 0.5  1.0  0.5       HRCT (04/06/24) IMPRESSION: 1. Pulmonary parenchymal pattern of interstitial lung disease, as detailed above, minimally progressive from 08/06/2022. Findings are categorized as probable UIP per consensus guidelines: Diagnosis of Idiopathic Pulmonary Fibrosis: An Official ATS/ERS/JRS/ALAT Clinical Practice Guideline. Am JINNY Honey Crit Care Med Vol 198, Iss 5, [phone], Feb 19 2017.  Assessment and Plan  Russell Brown Medication Management Thoroughly counseled patient on the efficacy, mechanism of action, dosing, administration, adverse effects, and  monitoring parameters of Russell Brown.  Patient verbalized understanding.   Goals of Therapy: Will not stop or reverse the progression of ILD. It will slow the progression of ILD.   Dosing: Recommended dose will be 18mg  1 tablet twice daily. May be administered with or without regard to food.   Adverse Effects: Weight loss (nerandomilast monotherapy: 8%) Decreased appetite (nerandomilast monotherapy: 6% to 9%) Diarrhea (nerandomilast monotherapy: 17% to 26%)  Monitoring: Monitor for diarrhea, decreased appetite, weight loss  Drug interactions: nerandomilast is a major substrate of CYP3A4. Avoid use of moderate and strong CYP3A4 inducers or inhibitors.  No moderate or strong CYP3A4 inducers or inhibitors noted on current, active medication list at this time  Access: Approval of Russell Brown through: insurance Rx sent to: [facility]: [phone]   Medication Reconciliation A drug regimen assessment was performed, including review of allergies, interactions, disease-state management, dosing and immunization history. Medications were reviewed with the patient, including name, instructions, indication, goals  of therapy, potential side effects, importance of adherence, and safe use.  PLAN:  1) START Russell Brown 18mg  1 tablet twice daily. 2) Rx triaged to [facility]: [phone]. Patient reports he has contact information for pharmacy.  3) Follow-up with Sarah Groce, NP, as planned on 05/30/24  Patient was given pharmacy team contact information in case any issues arise.  I discussed the assessment and treatment plan with the patient. The patient was provided an opportunity to ask questions and all were answered. The patient agreed with the plan and demonstrated an understanding of the instructions.   The patient was advised to call back or seek an in-person evaluation if the symptoms worsen or if the condition fails to improve as anticipated.  This appointment required 20  minutes of patient care (this includes precharting, chart review, review of results, virtual care, etc.).  Thank you for involving pharmacy to assist in providing this patient's care.   Aleck Puls, PharmD, BCPS, CPP Clinical Pharmacist  [facility]

## 2024-04-26 NOTE — Telephone Encounter (Signed)
 Completed Jascayd new start counseling in separate encounter - see pharmacotherapy clinic note 04/26/24.

## 2024-04-27 NOTE — Telephone Encounter (Signed)
 Seems o2 titration was not done during .  will have to do walk test on room air x 3-5 around office (simple walk test ) followed by o2 titration if needed. Can do when he visits Camie Lites 05/30/24

## 2024-04-30 ENCOUNTER — Encounter: Payer: Self-pay | Admitting: Internal Medicine

## 2024-05-01 NOTE — Telephone Encounter (Signed)
 Noted

## 2024-05-02 ENCOUNTER — Other Ambulatory Visit: Payer: Self-pay

## 2024-05-03 ENCOUNTER — Telehealth: Payer: Self-pay | Admitting: *Deleted

## 2024-05-03 NOTE — Telephone Encounter (Signed)
 Unclear why phone wasn't working yesterday.   Called [facility] - they show that Rx was filled and should arrive today to patient's address on 05/03/24.   Called patient to discuss - he is aware of above.   NFN

## 2024-05-03 NOTE — Telephone Encounter (Signed)
 Copied from CRM [id number]. Topic: Clinical - Prescription Issue >> May 02, 2024  1:39 PM Joesph PARAS wrote: Reason for CRM: Patient is calling to request that prescriptions be re-faxed to [facility] ASAP, as they claim they still do not have prescriptions from us . Patient originally requesting to speak to Aleck Puls, PharmD but states direct line provided rings and then hangs up. Please return call to patient if able and re-fax prescriptions.

## 2024-05-03 NOTE — Telephone Encounter (Signed)
 Please advise on letter requesting stair lift

## 2024-05-06 ENCOUNTER — Ambulatory Visit: Payer: Self-pay | Admitting: Internal Medicine

## 2024-05-06 NOTE — Telephone Encounter (Signed)
 Yes please do a letter. I can sign 05/07/24

## 2024-05-06 NOTE — Progress Notes (Signed)
 There is dust mite allergy   Plan  - plase look up in internet how to reduce this. THis can helop cough  -   Google Dust Mite Allergy and get info from Asthma and Allergy [employer] and pillows in zippered dust-proof covers. These covers are made of a material with pores too small to let dust mites and their waste product through. They are also called allergen-impermeable. Plastic or vinyl covers are the least expensive, but some people find them uncomfortable. You can buy other fabric allergen-impermeable covers from many regular bedding stores.   Wash your sheets and blankets weekly in hot water. You have to wash them in water that's at least 130 F or more to kill dust mites.  Get rid of all types of fabric that mites love and that you cannot easily wash regularly in hot water.   Avoid wall-to-wall carpeting, curtains, blinds, upholstered furniture and down-filled covers and pillows in the bedroom. Put roll-type shades on your windows instead of curtains.  Have someone without a dust mite allergy clean your bedroom. If this is not possible, wear a filtering mask when dusting or vacuuming. Many drug stores carry these items. Dusting and vacuuming stir up dust. So try to do these chores when you can stay out of the bedroom for a while afterward.   Special CERTIFIED filter vacuum cleaners can help to keep mites and mite waste from getting back into the air. CERTIFIED vacuums are scientifically tested and verified as more suitable for making your home healthier.  Vacuuming is not enough to remove all dust mites and their waste. A large amount of the dust mite population may remain because they live deep inside the stuffing of sofas, chairs, mattresses, pillows, and carpeting.  Treat other rooms in your house like your bedroom. Here are more tips:  Avoid wall-to-wall carpeting, if possible. If you do use carpeting, mites don't like the type with a short, tight  pile as much. Use washable throw rugs over regularly damp-mopped wood, linoleum or tiled floors. Wash rugs in hot water whenever possible. Cold water isn't as effective. Dry cleaning kills all dust mites and is also good for removing dust mites from living in fabrics. Keep the humidity in your home less than 50%. Use a dehumidifier and/or air conditioner to do this. Use a CERTIFIED filter with your central furnace and air conditioning unit. This can help trap dust mites from your entire home. Freestanding air cleaners only filter air in a limited area. Avoid devices that treat air with heat, electrostatic ions, or ozon

## 2024-05-07 ENCOUNTER — Encounter: Payer: Self-pay | Admitting: Internal Medicine

## 2024-05-08 ENCOUNTER — Telehealth: Payer: Self-pay | Admitting: *Deleted

## 2024-05-08 ENCOUNTER — Other Ambulatory Visit: Payer: Self-pay | Admitting: Medical Genetics

## 2024-05-08 NOTE — Telephone Encounter (Signed)
 Document patient request to have signed has been signed by Dr. Geronimo and put in the mail to the patient.  Message sent to patient via mychart to make him aware.  A copy of the document was made and sent to scan and a copy put in Dr. Delberta cabinet in B pod.  Nothing further needed.

## 2024-05-08 NOTE — Telephone Encounter (Signed)
 Document has been printed and put in a folder for Dr. Geronimo to sign this afternoon.

## 2024-05-09 ENCOUNTER — Ambulatory Visit (HOSPITAL_BASED_OUTPATIENT_CLINIC_OR_DEPARTMENT_OTHER)
Admission: RE | Admit: 2024-05-09 | Discharge: 2024-05-09 | Disposition: A | Discharge status: Home / Self Care | Source: Ambulatory Visit | Attending: Internal Medicine | Admitting: Internal Medicine

## 2024-05-09 DIAGNOSIS — R0609 Other forms of dyspnea: Secondary | ICD-10-CM | POA: Diagnosis present

## 2024-05-09 LAB — ECHOCARDIOGRAM COMPLETE
AR max vel: 1.87 cm2
AV Area VTI: 1.8 cm2
AV Area mean vel: 1.56 cm2
AV Mean grad: 7 mmHg
AV Peak grad: 13.4 mmHg
AV Vena cont: 0.3 cm
Ao pk vel: 1.83 m/s
Area-P 1/2: 2.62 cm2
Calc EF: 64.9 %
MV M vel: 5.61 m/s
MV Peak grad: 125.9 mmHg
S' Lateral: 2.3 cm
Single Plane A2C EF: 60 %
Single Plane A4C EF: 67.1 %

## 2024-05-09 NOTE — Progress Notes (Addendum)
 HPI: FU CAD. Cardiac catheterization September 2019 showed 25% right coronary artery and 50 to 55% ejection fraction. Abd ultrasound 10/20 showed no aneurysm. Coronary CTA May 2024 showed severe COPD and fibrotic lung disease, calcium  score 47 which is 22nd percentile and minimal plaque in the left main, LAD and RCA.  Echocardiogram November 2025 showed normal LV function, grade 1 diastolic dysfunction, mild aortic insufficiency.  High-resolution chest CT October 2025 showed interstitial lung disease consistent with UIP.  Since last seen he is having progressive dyspnea on exertion secondary to pulmonary disease.  No orthopnea, PND, pedal edema, chest pain or syncope.  Current Outpatient Medications  Medication Sig Dispense Refill   albuterol  (PROVENTIL ) (2.5 MG/3ML) 0.083% nebulizer solution Take 3 mLs (2.5 mg total) by nebulization every 6 (six) hours as needed for wheezing or shortness of breath. 75 mL 12   albuterol  (VENTOLIN  HFA) 108 (90 Base) MCG/ACT inhaler Inhale 2 puffs into the lungs every 6 (six) hours as needed for wheezing or shortness of breath. 8 g 2   aspirin  EC 81 MG tablet Take 81 mg by mouth daily.     atorvastatin  (LIPITOR ) 20 MG tablet Take 1 tablet (20 mg total) by mouth daily. 90 tablet 3   calcium  citrate (CALCITRATE - DOSED IN MG ELEMENTAL CALCIUM ) 950 (200 Ca) MG tablet Take 600 mg of elemental calcium  by mouth daily.     cholecalciferol (VITAMIN D3) 25 MCG (1000 UNIT) tablet Take 800 Units by mouth daily.     dutasteride  (AVODART ) 0.5 MG capsule Take 1 capsule (0.5 mg total) by mouth daily. 90 capsule 3   fish oil-omega-3 fatty acids  1000 MG capsule Take 2 g by mouth daily.      Fluticasone -Umeclidin-Vilant (TRELEGY ELLIPTA ) 200-62.5-25 MCG/ACT AEPB Inhale 1 each into the lungs daily. 1 each 12   Fluticasone -Umeclidin-Vilant (TRELEGY ELLIPTA ) 200-62.5-25 MCG/ACT AEPB Inhale 1 puff into the lungs daily at 2 PM.     levothyroxine  (SYNTHROID ) 75 MCG tablet TAKE 1 TABLET  BY MOUTH DAILY  BEFORE BREAKFAST 90 tablet 3   lisinopril -hydrochlorothiazide  (ZESTORETIC ) 10-12.5 MG tablet TAKE 1 TABLET BY MOUTH DAILY 90 tablet 3   methylPREDNISolone  (MEDROL  DOSEPAK) 4 MG TBPK tablet Take as prescribed on the box 21 tablet 0   Multiple Vitamin (MULTIVITAMIN WITH MINERALS) TABS tablet Take 1 tablet by mouth daily.     nerandomilast (JASCAYD) 18 MG tablet Take 18 mg by mouth every 12 (twelve) hours. 60 tablet 1   nitroGLYCERIN  (NITROSTAT ) 0.4 MG SL tablet PLACE 1 TABLET UNDER THE TONGUE EVERY 5 MINUTES AS NEEDED 25 tablet 0   omeprazole  (PRILOSEC) 40 MG capsule TAKE 1 CAPSULE BY MOUTH TWICE  DAILY 180 capsule 3   sertraline  (ZOLOFT ) 100 MG tablet Take 1.5 tablets (150 mg total) by mouth daily. 135 tablet 3   No current facility-administered medications for this visit.     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   CAP (community acquired pneumonia) 03/03/2012   COPD (chronic obstructive pulmonary disease) (HCC)    GERD (gastroesophageal reflux disease)    History of hiatal hernia    Hyperlipemia    Hypertension    Hypothyroidism    Pneumonia 2013; 2017   Preventative health care 12/06/2016   PTSD (post-traumatic stress disorder)    retired emergency planning/management officer    Pulmonary fibrosis (HCC)  Sleep apnea    mild - but does not wear a c-pap per pt   Throat burning 08/03/2023   Thyroid  disease    thyroid  nodules - half of thyroid  was removed per pt    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; table saw injury   FOOT FRACTURE SURGERY Left    heel OR; put 2 steel rods in and took them out 6 wks later   FRACTURE  SURGERY     HERNIA REPAIR Right 1965   LEFT HEART CATH AND CORONARY ANGIOGRAPHY N/A 02/21/2018   Procedure: LEFT HEART CATH AND CORONARY ANGIOGRAPHY;  Surgeon: Burnard Debby LABOR, MD;  Location: [facility];  Service: Cardiovascular;  Laterality: N/A;   NASAL SEPTUM SURGERY  1986   NISSEN FUNDOPLICATION  1990s   w/hernia repair   THYROIDECTOMY, PARTIAL  2000s   WISDOM TOOTH EXTRACTION      Social History   Socioeconomic History   Marital status: Married    Spouse name: Not on file   Number of children: 4   Years of education: Not on file   Highest education level: Associate degree: academic program  Occupational History   Occupation: retired Art Therapist: [employer]  Tobacco Use   Smoking status: Former    Current packs/day: 0.00    Average packs/day: 1 pack/day for 35.0 years (35.0 ttl pk-yrs)    Types: Cigarettes    Start date: 08/18/1961    Quit date: 08/18/1996    Years since quitting: 27.7    Passive exposure: Past   Smokeless tobacco: Never  Vaping Use   Vaping status: Never Used  Substance and Sexual Activity   Alcohol use: Yes    Alcohol/week: 14.0 standard drinks of alcohol    Types: 14 Cans of beer per week   Drug use: Never   Sexual activity: Not Currently    Partners: Female  Other Topics Concern   Not on file  Social History Narrative   Exercise- weights and walk on treadmill   Social Drivers of Health   Financial Resource Strain: Low Risk  (05/11/2024)   Overall Financial Resource Strain (CARDIA)    Difficulty of Paying Living Expenses: Not hard at all  Food Insecurity: No Food Insecurity (05/11/2024)   Hunger Vital Sign    Worried About Running Out of Food in the Last Year: Never true    Ran Out of Food in the Last Year: Never true  Transportation Needs: No Transportation Needs (05/11/2024)   PRAPARE - Administrator, Civil Service (Medical): No    Lack of Transportation (Non-Medical): No  Physical Activity:  Inactive (05/11/2024)   Exercise Vital Sign    Days of Exercise per Week: 0 days    Minutes of Exercise per Session: Not on file  Stress: No Stress Concern Present (05/11/2024)   [employer] of Occupational Health - Occupational Stress Questionnaire    Feeling of Stress: Only a little  Social Connections: Socially Integrated (05/11/2024)   Social Connection and Isolation Panel    Frequency of Communication with Friends and Family: Three times a week    Frequency of Social Gatherings with Friends and  Family: Once a week    Attends Religious Services: More than 4 times per year    Active Member of Clubs or Organizations: Yes    Attends Banker Meetings: 1 to 4 times per year    Marital Status: Married  Catering Manager Violence: Not At Risk (04/28/2021)   Humiliation, Afraid, Rape, and Kick questionnaire    Fear of Current or Ex-Partner: No    Emotionally Abused: No    Physically Abused: No    Sexually Abused: No    Family History  Problem Relation Age of Onset   Parkinsonism Mother    Osteoporosis Mother    Pneumonia Mother        59   Cancer Father        prostate,  skin   Hyperlipidemia Father    Hypertension Father    Prostate cancer Father    Asthma Son    Osteoporosis Paternal Aunt    Alzheimer's disease Paternal Uncle    Osteoporosis Maternal Grandmother    Cancer Maternal Grandmother        lung   Heart disease Paternal Grandmother        MI   Heart disease Paternal Grandfather        MI   Colon cancer Neg Hx    Pancreatic cancer Neg Hx    Stomach cancer Neg Hx    Esophageal cancer Neg Hx    Liver cancer Neg Hx    Rectal cancer Neg Hx     ROS: no fevers or chills, productive cough, hemoptysis, dysphasia, odynophagia, melena, hematochezia, dysuria, hematuria, rash, seizure activity, orthopnea, PND, pedal edema, claudication. Remaining systems are negative.  Physical Exam: Well-developed well-nourished in no acute distress.  Skin is warm  and dry.  HEENT is normal.  Neck is supple.  Chest with diminished breath sounds bilaterally Cardiovascular exam is regular rate and rhythm.  Abdominal exam nontender or distended. No masses palpated. Extremities show no edema. neuro grossly intact  EKG Interpretation Date/Time:  Wednesday May 16 2024 11:46:30 EST Ventricular Rate:  69 PR Interval:  188 QRS Duration:  108 QT Interval:  392 QTC Calculation: 420 R Axis:   -42  Text Interpretation: Normal sinus rhythm Left axis deviation Cannot rule out Anterior infarct , age undetermined Confirmed by Pietro Rogue ([id number]) on 05/16/2024 11:47:35 AM    A/P  1 coronary artery disease-minimal on previous CTA.  He denies chest pain.  Continue aspirin  and statin.  LV function is normal.  2 hyperlipidemia-continue statin.  3 hypertension-blood pressure elevated; however he follows this at home and it is typically controlled.  Continue present medications.  4 dyspnea-felt likely secondary to progressive lung disease/pulmonary fibrosis.  Patient will follow-up with pulmonary.  Rogue Pietro, MD

## 2024-05-10 ENCOUNTER — Ambulatory Visit: Payer: Self-pay | Admitting: Internal Medicine

## 2024-05-10 NOTE — Telephone Encounter (Signed)
    RESSIONS     1. Left ventricular ejection fraction, by estimation, is 60 to 65%. The  left ventricle has normal function. The left ventricle has no regional  wall motion abnormalities. Left ventricular diastolic parameters are  consistent with Grade I diastolic  dysfunction (impaired relaxation). The average left ventricular global  longitudinal strain is -22.3 %. The global longitudinal strain is normal.   2. Right ventricular systolic function is normal. The right ventricular  size is normal.   3. The mitral valve is normal in structure. No evidence of mitral valve  regurgitation. No evidence of mitral stenosis.   4. The aortic valve is normal in structure. Aortic valve regurgitation is  mild. No aortic stenosis is present.   5. The inferior vena cava is normal in size with greater than 50%  respiratory variability, suggesting right atrial pressure of 3 mmHg.   Comparison(s): Echocardiogram done 10/20/22 showed an EF of 60-65%

## 2024-05-10 NOTE — Progress Notes (Signed)
 Mild heart muscle stiffness other wise echo normal

## 2024-05-14 ENCOUNTER — Other Ambulatory Visit

## 2024-05-14 ENCOUNTER — Encounter: Payer: Self-pay | Admitting: Nurse Practitioner

## 2024-05-14 ENCOUNTER — Other Ambulatory Visit: Payer: Self-pay | Admitting: Medical Genetics

## 2024-05-14 ENCOUNTER — Ambulatory Visit: Admitting: Nurse Practitioner

## 2024-05-14 ENCOUNTER — Ambulatory Visit (INDEPENDENT_AMBULATORY_CARE_PROVIDER_SITE_OTHER): Admitting: Orthopedic Surgery

## 2024-05-14 VITALS — BP 105/64 | HR 78 | Ht 73.0 in | Wt 172.0 lb

## 2024-05-14 VITALS — BP 110/60 | HR 66 | Temp 97.7°F | Wt 171.8 lb

## 2024-05-14 DIAGNOSIS — N400 Enlarged prostate without lower urinary tract symptoms: Secondary | ICD-10-CM

## 2024-05-14 DIAGNOSIS — E038 Other specified hypothyroidism: Secondary | ICD-10-CM | POA: Diagnosis not present

## 2024-05-14 DIAGNOSIS — I10 Essential (primary) hypertension: Secondary | ICD-10-CM

## 2024-05-14 DIAGNOSIS — Z006 Encounter for examination for normal comparison and control in clinical research program: Secondary | ICD-10-CM

## 2024-05-14 DIAGNOSIS — E7800 Pure hypercholesterolemia, unspecified: Secondary | ICD-10-CM | POA: Diagnosis not present

## 2024-05-14 DIAGNOSIS — M545 Low back pain, unspecified: Secondary | ICD-10-CM

## 2024-05-14 DIAGNOSIS — F411 Generalized anxiety disorder: Secondary | ICD-10-CM

## 2024-05-14 LAB — COMPREHENSIVE METABOLIC PANEL WITH GFR
ALT: 18 U/L (ref 0–53)
AST: 16 U/L (ref 0–37)
Albumin: 4.6 g/dL (ref 3.5–5.2)
Alkaline Phosphatase: 79 U/L (ref 39–117)
BUN: 19 mg/dL (ref 6–23)
CO2: 30 meq/L (ref 19–32)
Calcium: 9.6 mg/dL (ref 8.4–10.5)
Chloride: 104 meq/L (ref 96–112)
Creatinine, Ser: 0.99 mg/dL (ref 0.40–1.50)
GFR: 73.01 mL/min (ref >60.00)
Glucose, Bld: 102 mg/dL — ABNORMAL HIGH (ref 70–99)
Potassium: 5.2 meq/L — ABNORMAL HIGH (ref 3.5–5.1)
Sodium: 141 meq/L (ref 135–145)
Total Bilirubin: 0.6 mg/dL (ref 0.2–1.2)
Total Protein: 6.7 g/dL (ref 6.0–8.3)

## 2024-05-14 LAB — LIPID PANEL
Cholesterol: 146 mg/dL (ref 0–200)
HDL: 51.5 mg/dL (ref >39.00)
LDL Cholesterol: 69 mg/dL (ref 0–99)
NonHDL: 94.2
Total CHOL/HDL Ratio: 3
Triglycerides: 127 mg/dL (ref 0.0–149.0)
VLDL: 25.4 mg/dL (ref 0.0–40.0)

## 2024-05-14 LAB — T4, FREE: Free T4: 1.05 ng/dL (ref 0.60–1.60)

## 2024-05-14 LAB — PSA, MEDICARE: PSA: 1.74 ng/mL (ref 0.10–4.00)

## 2024-05-14 LAB — TSH: TSH: 2.17 u[IU]/mL (ref 0.35–5.50)

## 2024-05-14 MED ORDER — METHYLPREDNISOLONE 4 MG PO TBPK: ORAL_TABLET | ORAL | 0 refills | Status: DC

## 2024-05-14 NOTE — Assessment & Plan Note (Signed)
 Followed by [facility]: Dr. Mena Goes Current use of avodart No hematuria or dysuria or retention Repeat PSA

## 2024-05-14 NOTE — Assessment & Plan Note (Addendum)
Repeat lipid panel Current use of atorvastatin 

## 2024-05-14 NOTE — Assessment & Plan Note (Signed)
 BP at goal with lisinopril /hctz BP Readings from Last 3 Encounters:  05/14/24 105/64  05/14/24 110/60  04/23/24 116/68    Maintain med dose Repeat CMP

## 2024-05-14 NOTE — Patient Instructions (Signed)
 Check BP at home in AM, no less than 3x/week. Send BP reading via mychart in 1week  Go to lab Maintain Heart healthy diet and daily exercise. Maintain current medications.

## 2024-05-14 NOTE — Assessment & Plan Note (Signed)
 Repeat TSH and T4 Maintain levothyroxine  dose Wt Readings from Last 3 Encounters:  05/14/24 172 lb (78 kg)  05/14/24 171 lb 12.8 oz (77.9 kg)  04/23/24 171 lb 9.6 oz (77.8 kg)

## 2024-05-14 NOTE — Progress Notes (Signed)
 Established Patient Visit  Patient: Russell Brown   DOB: [birth year]   78 y.o. Male  MRN: [id number] Visit Date: 05/14/2024  Subjective:    Chief Complaint  Patient presents with   Hypertension    Taking meds as prescribed. No concerns.   Hyperlipidemia    Fasting.   Hypothyroidism   HPI Essential hypertension BP at goal with lisinopril /hctz BP Readings from Last 3 Encounters:  05/14/24 105/64  05/14/24 110/60  04/23/24 116/68    Maintain med dose Repeat CMP  Hypothyroidism Repeat TSH and T4 Maintain levothyroxine  dose Wt Readings from Last 3 Encounters:  05/14/24 172 lb (78 kg)  05/14/24 171 lb 12.8 oz (77.9 kg)  04/23/24 171 lb 9.6 oz (77.8 kg)     Benign prostate hyperplasia Followed by [facility]: Dr. Nieves Current use of avodart  No hematuria or dysuria or retention Repeat PSA  Hyperlipidemia Repeat lipid panel Current use of atorvastatin   GAD (generalized anxiety disorder) Improved and stable Maintain zoloft  dose and CBT sessions   Reviewed medical, surgical, and social history today  Medications: Outpatient Medications Prior to Visit  Medication Sig   albuterol  (PROVENTIL ) (2.5 MG/3ML) 0.083% nebulizer solution Take 3 mLs (2.5 mg total) by nebulization every 6 (six) hours as needed for wheezing or shortness of breath.   albuterol  (VENTOLIN  HFA) 108 (90 Base) MCG/ACT inhaler Inhale 2 puffs into the lungs every 6 (six) hours as needed for wheezing or shortness of breath.   aspirin  EC 81 MG tablet Take 81 mg by mouth daily.   atorvastatin  (LIPITOR ) 20 MG tablet Take 1 tablet (20 mg total) by mouth daily.   calcium  citrate (CALCITRATE - DOSED IN MG ELEMENTAL CALCIUM ) 950 (200 Ca) MG tablet Take 600 mg of elemental calcium  by mouth daily.   cholecalciferol (VITAMIN D3) 25 MCG (1000 UNIT) tablet Take 800 Units by mouth daily.   dutasteride  (AVODART ) 0.5 MG capsule Take 1 capsule (0.5 mg total) by mouth daily.   fish oil-omega-3  fatty acids 1000 MG capsule Take 2 g by mouth daily.    Fluticasone -Umeclidin-Vilant (TRELEGY ELLIPTA ) 200-62.5-25 MCG/ACT AEPB Inhale 1 each into the lungs daily.   Fluticasone -Umeclidin-Vilant (TRELEGY ELLIPTA ) 200-62.5-25 MCG/ACT AEPB Inhale 1 puff into the lungs daily at 2 PM.   levothyroxine  (SYNTHROID ) 75 MCG tablet TAKE 1 TABLET BY MOUTH DAILY  BEFORE BREAKFAST   lisinopril -hydrochlorothiazide  (ZESTORETIC ) 10-12.5 MG tablet TAKE 1 TABLET BY MOUTH DAILY   Multiple Vitamin (MULTIVITAMIN WITH MINERALS) TABS tablet Take 1 tablet by mouth daily.   nerandomilast (JASCAYD) 18 MG tablet Take 18 mg by mouth every 12 (twelve) hours.   nitroGLYCERIN  (NITROSTAT ) 0.4 MG SL tablet PLACE 1 TABLET UNDER THE TONGUE EVERY 5 MINUTES AS NEEDED   omeprazole  (PRILOSEC) 40 MG capsule TAKE 1 CAPSULE BY MOUTH TWICE  DAILY   sertraline  (ZOLOFT ) 100 MG tablet Take 1.5 tablets (150 mg total) by mouth daily.   No facility-administered medications prior to visit.   Reviewed past medical and social history.   ROS per HPI above      Objective:  BP 110/60 (BP Location: Right Arm, Patient Position: Sitting)   Pulse 66   Temp 97.7 F (36.5 C) (Oral)   Wt 171 lb 12.8 oz (77.9 kg)   SpO2 94%   BMI 22.67 kg/m   BP Readings from Last 3 Encounters:  05/14/24 105/64  05/14/24 110/60  04/23/24 116/68  Wt Readings from Last 3 Encounters:  05/14/24 172 lb (78 kg)  05/14/24 171 lb 12.8 oz (77.9 kg)  04/23/24 171 lb 9.6 oz (77.8 kg)    Physical Exam Vitals and nursing note reviewed.  Cardiovascular:     Rate and Rhythm: Normal rate and regular rhythm.     Pulses: Normal pulses.     Heart sounds: Normal heart sounds.  Pulmonary:     Effort: Pulmonary effort is normal.     Breath sounds: Normal breath sounds.  Neurological:     Mental Status: He is alert and oriented to person, place, and time.  Psychiatric:        Mood and Affect: Mood normal.        Behavior: Behavior normal.        Thought  Content: Thought content normal.     Results for orders placed or performed in visit on 05/14/24  Lipid panel  Result Value Ref Range   Cholesterol 146 0 - 200 mg/dL   Triglycerides 872.9 0.0 - 149.0 mg/dL   HDL 48.49 >60.99 mg/dL   VLDL 74.5 0.0 - 59.9 mg/dL   LDL Cholesterol 69 0 - 99 mg/dL   Total CHOL/HDL Ratio 3    NonHDL 94.20   Comprehensive metabolic panel with GFR  Result Value Ref Range   Sodium 141 135 - 145 mEq/L   Potassium 5.2 No hemolysis seen (H) 3.5 - 5.1 mEq/L   Chloride 104 96 - 112 mEq/L   CO2 30 19 - 32 mEq/L   Glucose, Bld 102 (H) 70 - 99 mg/dL   BUN 19 6 - 23 mg/dL   Creatinine, Ser 9.00 0.40 - 1.50 mg/dL   Total Bilirubin 0.6 0.2 - 1.2 mg/dL   Alkaline Phosphatase 79 39 - 117 U/L   AST 16 0 - 37 U/L   ALT 18 0 - 53 U/L   Total Protein 6.7 6.0 - 8.3 g/dL   Albumin 4.6 3.5 - 5.2 g/dL   GFR 26.98 >39.99 mL/min   Calcium  9.6 8.4 - 10.5 mg/dL  T4, free  Result Value Ref Range   Free T4 1.05 0.60 - 1.60 ng/dL  TSH  Result Value Ref Range   TSH 2.17 0.35 - 5.50 uIU/mL  PSA, Medicare  Result Value Ref Range   PSA 1.74 0.10 - 4.00 ng/ml      Assessment & Plan:    Problem List Items Addressed This Visit     Benign prostate hyperplasia   Followed by [facility]: Dr. Nieves Current use of avodart  No hematuria or dysuria or retention Repeat PSA      Relevant Orders   PSA, Medicare (Completed)   Essential hypertension - Primary   BP at goal with lisinopril /hctz BP Readings from Last 3 Encounters:  05/14/24 105/64  05/14/24 110/60  04/23/24 116/68    Maintain med dose Repeat CMP      Relevant Orders   Comprehensive metabolic panel with GFR (Completed)   GAD (generalized anxiety disorder)   Improved and stable Maintain zoloft  dose and CBT sessions      Hyperlipidemia   Repeat lipid panel Current use of atorvastatin       Relevant Orders   Lipid panel (Completed)   Comprehensive metabolic panel with GFR (Completed)    Hypothyroidism   Repeat TSH and T4 Maintain levothyroxine  dose Wt Readings from Last 3 Encounters:  05/14/24 172 lb (78 kg)  05/14/24 171 lb 12.8 oz (77.9 kg)  04/23/24 171 lb 9.6  oz (77.8 kg)         Relevant Orders   T4, free (Completed)   TSH (Completed)   Other Visit Diagnoses       Research study patient       Relevant Orders   [facility] Molecular Screen - Blood      Return in about 6 months (around 11/11/2024) for HTN, Hypothyroidism, hyperlipidemia (fasting).     Roselie Mood, NP

## 2024-05-14 NOTE — Assessment & Plan Note (Signed)
 Improved and stable Maintain zoloft  dose and CBT sessions

## 2024-05-16 ENCOUNTER — Ambulatory Visit: Payer: Self-pay | Admitting: Nurse Practitioner

## 2024-05-16 ENCOUNTER — Encounter: Payer: Self-pay | Admitting: Cardiology

## 2024-05-16 ENCOUNTER — Ambulatory Visit: Attending: Cardiology | Admitting: Cardiology

## 2024-05-16 VITALS — BP 157/75 | HR 69 | Ht 73.0 in | Wt 172.0 lb

## 2024-05-16 DIAGNOSIS — I2510 Atherosclerotic heart disease of native coronary artery without angina pectoris: Secondary | ICD-10-CM

## 2024-05-16 DIAGNOSIS — E785 Hyperlipidemia, unspecified: Secondary | ICD-10-CM | POA: Diagnosis not present

## 2024-05-16 DIAGNOSIS — R072 Precordial pain: Secondary | ICD-10-CM | POA: Diagnosis not present

## 2024-05-16 DIAGNOSIS — R0602 Shortness of breath: Secondary | ICD-10-CM

## 2024-05-16 DIAGNOSIS — I10 Essential (primary) hypertension: Secondary | ICD-10-CM

## 2024-05-16 NOTE — Patient Instructions (Signed)

## 2024-05-17 NOTE — Addendum Note (Signed)
 Addended by: GEORGINA SHARPER on: 05/17/2024 10:49 AM   Modules accepted: Level of Service

## 2024-05-17 NOTE — Progress Notes (Signed)
 Orthopedic Spine Surgery Office Note  Assessment: Patient is a 78 y.o. male with recent onset of low back pain. No radicular pain. Possible facetogenic in nature   Plan: -Explained that initially conservative treatment is tried as a significant number of patients may experience relief with these treatment modalities. Discussed that the conservative treatments include:  -activity modification  -physical therapy  -over the counter pain medications  -medrol  dosepak  -lumbar steroid injections -Patient has tried lidocaine  patches, tylenol   -Recommended medrol  dose pak which was prescribed to him today. He should continue with tylenol  and ibuprofen. Discussed core strengthening -If he is not doing any better at our next visit, will order a MRI to evaluate further -Patient should return to office in 6 weeks, x-rays at next visit: none   Patient expressed understanding of the plan and all questions were answered to the patient's satisfaction.   ___________________________________________________________________________   History:  Patient is a 78 y.o. male who presents today for lumbar spine.  Patient has had about 4 months of significant low back pain.  He feels it on both sides of his lower lumbar region.  However, it is worse on the right side.  He notices it will catch in the morning.  It sometimes worse at night.  He does not have any pain radiating into either lower extremity.  He has been using Tylenol  and lidocaine  patches.  He does find that they are helpful in terms of reducing the pain.  There was no trauma or injury that preceded the onset of his pain.   Weakness: denies Symptoms of imbalance: denies Paresthesias and numbness: denies Bowel or bladder incontinence: denies Saddle anesthesia: denies  Treatments tried: lidocaine  patches, tylenol   Review of systems: Denies fevers and chills, night sweats, unexplained weight loss, history of cancer. Has had pain that wakes him at  night  Past medical history: HTN HLD CAD Anxiety GERD COPD  Allergies: codeine, dextromethorphan  Past surgical history:  Finger amputation Hernia repair Nissen fundoplication Partial thyroidectomy Appendectomy  Social history: Denies use of nicotine product (smoking, vaping, patches, smokeless) Alcohol use: Yes, approximately 8 drinks per week Denies recreational drug use   Physical Exam:  BMI of 22.7  General: no acute distress, appears stated age Neurologic: alert, answering questions appropriately, following commands Respiratory: unlabored breathing on room air, symmetric chest rise Psychiatric: appropriate affect, normal cadence to speech   MSK (spine):  -Strength exam      Left  Right EHL    5/5  5/5 TA    5/5  5/5 GSC    5/5  5/5 Knee extension  5/5  5/5 Hip flexion   5/5  5/5  -Sensory exam    Sensation intact to light touch in L3-S1 nerve distributions of bilateral lower extremities  -Achilles DTR: 2/4 on the left, 2/4 on the right -Patellar tendon DTR: 2/4 on the left, 2/4 on the right  -Straight leg raise: negative bilaterally -Clonus: no beats bilaterally  -Left hip exam: no pain through range of motion -Right hip exam: no pain through range of motion  Imaging: XRs of the lumbar spine from 05/14/2024 were independently reviewed and interpreted, showing no significant degenerative changes seen. No fracture or dislocation seen. No evidence of instability on flexion/extension views.    Patient name: Russell Brown Patient MRN: [id number] Date of visit: 05/14/2024

## 2024-05-18 ENCOUNTER — Encounter: Payer: Self-pay | Admitting: Internal Medicine

## 2024-05-20 ENCOUNTER — Encounter: Payer: Self-pay | Admitting: Nurse Practitioner

## 2024-05-21 ENCOUNTER — Telehealth: Payer: Self-pay

## 2024-05-21 DIAGNOSIS — J849 Interstitial pulmonary disease, unspecified: Secondary | ICD-10-CM

## 2024-05-21 MED ORDER — JASCAYD 18 MG PO TABS: 18.0000 mg | ORAL_TABLET | Freq: Two times a day (BID) | ORAL | 5 refills | Status: AC

## 2024-05-21 NOTE — Telephone Encounter (Signed)
 Refill sent for JASCAYD to [facility]: [phone]   Dose: 18mg  twice daily   Last OV: 04/23/24 Provider: Dr. Geronimo  Next OV: 05/30/24   Aleck Puls, PharmD, BCPS Clinical Pharmacist  [facility]

## 2024-05-21 NOTE — Telephone Encounter (Signed)
 Received fax from [facility] requesting [facility]. Form Chart review, patient is frustrated as he received a notification from [facility] that they are waiting on authorization from us . Fill history shows there is a current refill and patient should not be out of medication.  Called [facility] to investigation. Shipment en route to patient currently with promise date: 05/25/24. He will need future refills.   MyChart message sent to patient to explain above information. Sent further refills.

## 2024-05-26 LAB — GENECONNECT MOLECULAR SCREEN: Genetic Analysis Overall Interpretation: NEGATIVE

## 2024-05-30 ENCOUNTER — Ambulatory Visit (INDEPENDENT_AMBULATORY_CARE_PROVIDER_SITE_OTHER): Admitting: Acute Care

## 2024-05-30 ENCOUNTER — Encounter: Payer: Self-pay | Admitting: Acute Care

## 2024-05-30 VITALS — BP 149/83 | HR 88 | Temp 98.1°F | Ht 73.0 in | Wt 175.2 lb

## 2024-05-30 DIAGNOSIS — J9621 Acute and chronic respiratory failure with hypoxia: Secondary | ICD-10-CM

## 2024-05-30 DIAGNOSIS — R0609 Other forms of dyspnea: Secondary | ICD-10-CM

## 2024-05-30 DIAGNOSIS — Z87891 Personal history of nicotine dependence: Secondary | ICD-10-CM

## 2024-05-30 DIAGNOSIS — R059 Cough, unspecified: Secondary | ICD-10-CM

## 2024-05-30 DIAGNOSIS — R9389 Abnormal findings on diagnostic imaging of other specified body structures: Secondary | ICD-10-CM

## 2024-05-30 DIAGNOSIS — R079 Chest pain, unspecified: Secondary | ICD-10-CM | POA: Diagnosis not present

## 2024-05-30 DIAGNOSIS — J84112 Idiopathic pulmonary fibrosis: Secondary | ICD-10-CM | POA: Diagnosis not present

## 2024-05-30 DIAGNOSIS — J849 Interstitial pulmonary disease, unspecified: Secondary | ICD-10-CM

## 2024-05-30 DIAGNOSIS — R0902 Hypoxemia: Secondary | ICD-10-CM

## 2024-05-30 NOTE — Patient Instructions (Addendum)
 It is good to see you today. I am glad you are doing well on the new medication . Please monitor your weight while on Jascayd. Continue Jascayd 18 mg twice daily. We have walked you today, and you do qualify for oxygen with exercise.  We will order a POC for you to use.  Use this at 2 L [place] with exercise I have also ordered an overnight oximetry. You will get a call to get this scheduled.  Follow up with Dr. Geronimo as is scheduled 06/30/2024. Please contact office for sooner follow up if symptoms do not improve or worsen or seek emergency care

## 2024-05-30 NOTE — Progress Notes (Signed)
 History of Present Illness Russell Brown is a 78 y.o. male former smoker. Past medical history significant for COPD, asthma, GERD, hiatal hernia , status post Nissen's fundoplication. Patient is followed by Dr. Shellia Dr. Theophilus. He was originally referred due to CT finding showing pulmonary fibrosis, he unable to tolerate Ofev  of Esbriet   discontinued November 2023, and was lost to follow up , last office visit with [facility] was April 2024.   Patient was referred back to pulmonary October 2025 for CT scan findings showing pulmonary fibrosis/ILD. Last CT imaging was February 2024 which showed mild pulmonary fibrosis with UIP pattern and no progression since the last CT imaging in November 2023. Patient presented in October 2025 with worsening dyspnea on exertion, maintained on an Trelegy 100 inhaler, and had failed previous antifibrotic treatment with Ofev  and Esbriet  due to side effects.  He endorsed a worsening dry cough.  he had been followed by pulmonary rehab but had to stop early due to a family crisis. After the March 27, 2024 office visit his Trelegy was increased to 200 mcg daily, and HRCT was ordered to evaluate for potential causes of chest pain as well as to evaluate for ILD progression, pulmonary function testing was ordered , as well as a 6-minute walk. HRCT did show mild progression of his ILD since last imaging  Patient was then seen by Dr. Geronimo on 04/23/2024.  He was started on Jascayd 18 mg twice daily after that visit. Patient is here today for an ambulatory saturation walk test to determine if he qualifies for home oxygen, and to ensure he is doing well after starting Jascayd for his ILD  05/30/2024 Discussed the use of AI scribe software for clinical note transcription with the patient, who gave verbal consent to proceed.  History of Present Illness Pt. Presents for follow up after starting Jascayd for his ILD.  He was walked, and dropped to 87% requiring 1 L  nasal cannula to maintain oxygen saturations of greater than 88% with ambulation.  Patient is in agreement with us  ordering home oxygen for use with exertion.  Dr. Geronimo had ordered an overnight oximetry at the previous office visit however patient states that they have not received a call to get this scheduled. We will reorder the overnight oximetry and have the patient care coordinator reach out to the patient to get this scheduled.  I did explain to the patient if he is desaturating while sleeping he will need to wear oxygen with sleep.  He verbalized understanding.  Patient continues on Trelegy 200 for his COPD.  He states this is very helpful.  He is compliant with his medication. Patient states he is tolerating Jascayd 18 mg twice daily without any problem.  He states he does not have quite the same appetite as he had prior to starting the medication however his able to tolerated easily compared to his previous treatments with antifibrotic's.  His weight has been stable.  I discussed with both the patient and his wife how important was to weigh daily and to make sure he did not have weight loss while on this medication.  He did get a 2D echo done in addition to a check up by cardiology.  Echo showed EF of 60 to 65% with normal LV function, consistent with grade 1 diastolic dysfunction.  Explained to the patient he had some mild heart muscle stiffness but otherwise a normal echo.    Patient will follow-up with Dr. Geronimo in 1  month for continued therapeutic monitoring of the Jascayd.  We have placed the order for oxygen and POC for use at 1 to 2 L as needed to maintain oxygen saturations greater than 88% with exertion. We have also ordered an overnight oximetry.  Test Results: HRCT 04/06/2024 Pulmonary parenchymal pattern of interstitial lung disease, as detailed above, minimally progressive from 08/06/2022. Findings are categorized as probable UIP. Lungs/Pleura: Centrilobular and  paraseptal emphysema. Basilar predominant subpleural reticular densities and traction bronchiolectasis, minimally progressive from 08/06/2022. No pleural fluid. Airway is unremarkable. No air trapping.    10/29/2023 CT Coronary Morph w/ CTA COR w/Score Heart is normal size. Aorta normal caliber. Small hiatal hernia. No adenopathy. Severe centrilobular emphysema and fibrotic lung disease, unchanged since prior study. No acute confluent opacities or effusions. No acute findings in the upper abdomen. Chest wall soft tissues are unremarkable. No acute bony abnormality.   IMPRESSION: Severe COPD and fibrotic lung disease.   08/06/2022 CT chest No acute consolidative airspace disease to suggest a pneumonia. Severe centrilobular emphysema with mild diffuse bronchial wall thickening, suggesting COPD. 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 No appreciable interval progression. Two-vessel coronary atherosclerosis.Small to moderate hiatal hernia. Diffuse osteopenia. Aortic Atherosclerosis (ICD10-I70.0) and Emphysema (ICD10-J43.9).   PFT 04/11/2024                  Latest Ref Rng & Units 04/23/2024   12:29 PM 08/05/2022    9:53 AM 04/08/2022    9:33 AM  CBC  WBC 4.0 - 10.5 K/uL 8.4  7.4  7.1   Hemoglobin 13.0 - 17.0 g/dL 84.8  83.8  84.5   Hematocrit 39.0 - 52.0 % 45.1  47.1  46.7   Platelets 150.0 - 400.0 K/uL 213.0  210.0  203.0        Latest Ref Rng & Units 05/14/2024    9:08 AM 11/10/2023    9:56 AM 05/13/2023   10:18 AM  BMP  Glucose 70 - 99 mg/dL 897  99  893   BUN 6 - 23 mg/dL 19  14  16    Creatinine 0.40 - 1.50 mg/dL 9.00  9.06  9.02   Sodium 135 - 145 mEq/L 141  139  139   Potassium 3.5 - 5.1 mEq/L 5.2 No hemolysis seen  4.2  3.8   Chloride 96 - 112 mEq/L 104  102  101   CO2 19 - 32 mEq/L 30  29  30    Calcium  8.4 - 10.5 mg/dL 9.6   9.1  9.2     BNP No results found for: BNP  ProBNP    Component Value Date/Time   PROBNP 35.0 08/05/2022 0953    PFT    Component Value Date/Time   FEV1PRE 1.74 04/11/2024 1325   FEV1POST 1.91 09/29/2022 0954   FVCPRE 3.36 04/11/2024 1325   FVCPOST 3.70 09/29/2022 0954   TLC 6.34 04/11/2024 1325   DLCOUNC 10.03 04/11/2024 1325   PREFEV1FVCRT 52 04/11/2024 1325   PSTFEV1FVCRT 52 09/29/2022 0954    XR Lumbar Spine Complete Result Date: 05/17/2024 XRs of the lumbar spine from 05/14/2024 were independently reviewed and interpreted, showing no significant degenerative changes seen. No fracture or dislocation seen. No evidence of instability on flexion/extension views.   ECHOCARDIOGRAM COMPLETE Result Date: 05/09/2024    ECHOCARDIOGRAM REPORT   Patient Name:   Russell Brown [facility] Date of Exam: 05/09/2024 Medical Rec #:  [id number]  Height:       73.0 in Accession #:    [id number]        Weight:       171.6 lb Date of Birth:  1945/10/25         BSA:          2.016 m Patient Age:    78 years          BP:           116/88 mmHg Patient Gender: M                 HR:           81 bpm. Exam Location:  [place] Procedure: 2D Echo, 3D Echo, Cardiac Doppler, Color Doppler and Strain Analysis            (Both Spectral and Color Flow Doppler were utilized during            procedure). Indications:    R06.9 DOE  History:        Patient has prior history of Echocardiogram examinations, most                 recent 10/20/2022. CAD, COPD and Pulmonary fibrosis,                 Signs/Symptoms:Dyspnea; Risk Factors:Sleep Apnea, Hypertension,                 Dyslipidemia and Former Smoker.  Sonographer:    Alan Greenhouse RDMS, RVT, RDCS Referring Phys: [id number] MURALI RAMASWAMY IMPRESSIONS  1. Left ventricular ejection fraction, by estimation, is 60 to 65%. The left ventricle has normal function. The left ventricle has no regional wall motion abnormalities. Left ventricular diastolic parameters are  consistent with Grade I diastolic dysfunction (impaired relaxation). The average left ventricular global longitudinal strain is -22.3 %. The global longitudinal strain is normal.  2. Right ventricular systolic function is normal. The right ventricular size is normal.  3. The mitral valve is normal in structure. No evidence of mitral valve regurgitation. No evidence of mitral stenosis.  4. The aortic valve is normal in structure. Aortic valve regurgitation is mild. No aortic stenosis is present.  5. The inferior vena cava is normal in size with greater than 50% respiratory variability, suggesting right atrial pressure of 3 mmHg. Comparison(s): Echocardiogram done 10/20/22 showed an EF of 60-65%. FINDINGS  Left Ventricle: Left ventricular ejection fraction, by estimation, is 60 to 65%. The left ventricle has normal function. The left ventricle has no regional wall motion abnormalities. The average left ventricular global longitudinal strain is -22.3 %. Strain was performed and the global longitudinal strain is normal. The left ventricular internal cavity size was normal in size. There is no left ventricular hypertrophy. Left ventricular diastolic parameters are consistent with Grade I diastolic dysfunction (impaired relaxation). Right Ventricle: The right ventricular size is normal. No increase in right ventricular wall thickness. Right ventricular systolic function is normal. Left Atrium: Left atrial size was normal in size. Right Atrium: Right atrial size was normal in size. Pericardium: There is no evidence of pericardial effusion. Mitral Valve: The mitral valve is normal in structure. No evidence of mitral valve regurgitation. No evidence of mitral valve stenosis. Tricuspid Valve: The tricuspid valve is normal in structure. Tricuspid valve regurgitation is not demonstrated. No evidence of tricuspid stenosis. Aortic Valve: The aortic valve is normal in structure. Aortic valve regurgitation is mild. No aortic stenosis  is present. Aortic valve mean  gradient measures 7.0 mmHg. Aortic valve peak gradient measures 13.4 mmHg. Aortic valve area, by VTI measures 1.80  cm. Pulmonic Valve: The pulmonic valve was normal in structure. Pulmonic valve regurgitation is not visualized. No evidence of pulmonic stenosis. Aorta: The aortic root is normal in size and structure. Venous: The inferior vena cava is normal in size with greater than 50% respiratory variability, suggesting right atrial pressure of 3 mmHg. IAS/Shunts: No atrial level shunt detected by color flow Doppler.  LEFT VENTRICLE PLAX 2D LVIDd:         4.40 cm     Diastology LVIDs:         2.30 cm     LV e' medial:    5.66 cm/s LV PW:         1.00 cm     LV E/e' medial:  10.4 LV IVS:        0.80 cm     LV e' lateral:   11.70 cm/s LVOT diam:     2.00 cm     LV E/e' lateral: 5.0 LV SV:         62 LV SV Index:   31          2D Longitudinal Strain LVOT Area:     3.14 cm    2D Strain GLS (A4C):   -21.7 % LV IVRT:       92 msec     2D Strain GLS (A3C):   -22.7 %                            2D Strain GLS (A2C):   -22.6 %                            2D Strain GLS Avg:     -22.3 % LV Volumes (MOD) LV vol d, MOD A2C: 64.2 ml LV vol d, MOD A4C: 75.6 ml LV vol s, MOD A2C: 25.7 ml 3D Volume EF: LV vol s, MOD A4C: 24.9 ml 3D EF:        58 % LV SV MOD A2C:     38.5 ml LV EDV:       128 ml LV SV MOD A4C:     75.6 ml LV ESV:       54 ml LV SV MOD BP:      45.9 ml LV SV:        74 ml RIGHT VENTRICLE RV S prime:     15.10 cm/s  PULMONARY VEINS TAPSE (M-mode): 1.8 cm      Diastolic Velocity: 45.60 cm/s                             S/D Velocity:       1.70                             Systolic Velocity:  77.00 cm/s LEFT ATRIUM             Index        RIGHT ATRIUM           Index LA diam:        4.00 cm 1.98 cm/m   RA Area:     10.80 cm LA Vol (A2C):   38.7 ml 19.20 ml/m  RA  Volume:   20.80 ml  10.32 ml/m LA Vol (A4C):   27.9 ml 13.84 ml/m LA Biplane Vol: 33.3 ml 16.52 ml/m  AORTIC VALVE AV Area  (Vmax):    1.87 cm AV Area (Vmean):   1.56 cm AV Area (VTI):     1.80 cm AV Vmax:           183.00 cm/s AV Vmean:          122.000 cm/s AV VTI:            0.343 m AV Peak Grad:      13.4 mmHg AV Mean Grad:      7.0 mmHg LVOT Vmax:         109.00 cm/s LVOT Vmean:        60.600 cm/s LVOT VTI:          0.196 m LVOT/AV VTI ratio: 0.57 AR Vena Contracta: 0.30 cm  AORTA Ao Root diam: 3.00 cm Ao Asc diam:  2.90 cm MITRAL VALVE               TRICUSPID VALVE MV Area (PHT): 2.62 cm    TR Peak grad:   21.9 mmHg MV Decel Time: 290 msec    TR Vmax:        234.00 cm/s MR Peak grad: 125.9 mmHg MR Vmax:      561.00 cm/s  SHUNTS MV E velocity: 58.70 cm/s  Systemic VTI:  0.20 m MV A velocity: 67.10 cm/s  Systemic Diam: 2.00 cm MV E/A ratio:  0.87 Jennifer Crape MD Electronically signed by Jennifer Crape MD Signature Date/Time: 05/09/2024/1:31:58 PM    Final      Past medical hx Past Medical History:  Diagnosis Date   Arthritis    osteo arthritis left wrist , 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   CAP (community acquired pneumonia) 03/03/2012   COPD (chronic obstructive pulmonary disease) (HCC)    GERD (gastroesophageal reflux disease)    History of hiatal hernia    Hyperlipemia    Hypertension    Hypothyroidism    Pneumonia 2013; 2017   Preventative health care 12/06/2016   PTSD (post-traumatic stress disorder)    retired emergency planning/management officer    Pulmonary fibrosis (HCC)    Sleep apnea    mild - but does not wear a c-pap per pt   Throat burning 08/03/2023   Thyroid  disease    thyroid  nodules - half of thyroid  was removed per pt     Social History   Tobacco Use   Smoking status: Former    Current packs/day: 0.00    Average packs/day: 1 pack/day for 35.0 years (35.0 ttl pk-yrs)    Types: Cigarettes    Start date: 08/18/1961    Quit date: 08/18/1996    Years since quitting: 27.8    Passive exposure: Past   Smokeless tobacco: Never  Vaping Use   Vaping status:  Never Used  Substance Use Topics   Alcohol use: Yes    Alcohol/week: 14.0 standard drinks of alcohol    Types: 14 Cans of beer per week   Drug use: Never    Russell Brown reports that he quit smoking about 27 years ago. His smoking use included cigarettes. He started smoking about 62 years ago. He has a 35 pack-year smoking history. He has been exposed to tobacco smoke. He has never used smokeless tobacco. He reports current alcohol use of about 14.0  standard drinks of alcohol per week. He reports that he does not use drugs.  Tobacco Cessation: Counseling given: Not Answered Former smoker, quit 1998 with a 35 pack year smoking   Past surgical hx, Family hx, Social hx all reviewed.  Current Outpatient Medications on File Prior to Visit  Medication Sig   albuterol  (PROVENTIL ) (2.5 MG/3ML) 0.083% nebulizer solution Take 3 mLs (2.5 mg total) by nebulization every 6 (six) hours as needed for wheezing or shortness of breath.   albuterol  (VENTOLIN  HFA) 108 (90 Base) MCG/ACT inhaler Inhale 2 puffs into the lungs every 6 (six) hours as needed for wheezing or shortness of breath.   aspirin  EC 81 MG tablet Take 81 mg by mouth daily.   atorvastatin  (LIPITOR ) 20 MG tablet Take 1 tablet (20 mg total) by mouth daily.   calcium  citrate (CALCITRATE - DOSED IN MG ELEMENTAL CALCIUM ) 950 (200 Ca) MG tablet Take 600 mg of elemental calcium  by mouth daily.   cholecalciferol (VITAMIN D3) 25 MCG (1000 UNIT) tablet Take 800 Units by mouth daily.   dutasteride  (AVODART ) 0.5 MG capsule Take 1 capsule (0.5 mg total) by mouth daily.   fish oil-omega-3 fatty acids  1000 MG capsule Take 2 g by mouth daily.    Fluticasone -Umeclidin-Vilant (TRELEGY ELLIPTA ) 200-62.5-25 MCG/ACT AEPB Inhale 1 each into the lungs daily.   levothyroxine  (SYNTHROID ) 75 MCG tablet TAKE 1 TABLET BY MOUTH DAILY  BEFORE BREAKFAST   lisinopril -hydrochlorothiazide  (ZESTORETIC ) 10-12.5 MG tablet TAKE 1 TABLET BY MOUTH DAILY   Multiple Vitamin  (MULTIVITAMIN WITH MINERALS) TABS tablet Take 1 tablet by mouth daily.   nerandomilast (JASCAYD) 18 MG tablet Take 18 mg by mouth every 12 (twelve) hours.   nitroGLYCERIN  (NITROSTAT ) 0.4 MG SL tablet PLACE 1 TABLET UNDER THE TONGUE EVERY 5 MINUTES AS NEEDED   omeprazole  (PRILOSEC) 40 MG capsule TAKE 1 CAPSULE BY MOUTH TWICE  DAILY   sertraline  (ZOLOFT ) 100 MG tablet Take 1.5 tablets (150 mg total) by mouth daily.   No current facility-administered medications on file prior to visit.     Allergies  Allergen Reactions   Dextromorphan Hydrochloride [Dextromethorphan]     Dizzy, nervous, mental clouding.    Codeine Anxiety    Reaction if taken for extended periods of time.    Review Of Systems:  Constitutional:   +  weight loss, No night sweats,  Fevers, chills,+ fatigue, or  lassitude.  HEENT:   No headaches,  Difficulty swallowing,  Tooth/dental problems, or  Sore throat,                No sneezing, itching, ear ache, nasal congestion, post nasal drip,   CV:  No chest pain,  Orthopnea, PND, swelling in lower extremities, anasarca, dizziness, palpitations, syncope.   GI  No heartburn, indigestion, abdominal pain, nausea, vomiting, diarrhea, change in bowel habits, loss of appetite, bloody stools.   Resp: + shortness of breath with exertion less at rest.  No excess mucus, no productive cough,  + non-productive cough,  No coughing up of blood.  No change in color of mucus.  No wheezing.  No chest wall deformity  Skin: no rash or lesions.  GU: no dysuria, change in color of urine, no urgency or frequency.  No flank pain, no hematuria   MS:  No joint pain or swelling.  No decreased range of motion.  No back pain.  Psych:  No change in mood or affect. No depression or anxiety.  No memory loss.   Vital Signs  BP (!) 149/83   Pulse 77   Temp 98.1 F (36.7 C) (Oral)   Ht 6' 1 (1.854 m)   Wt 175 lb 3.2 oz (79.5 kg)   SpO2 97% Comment: on room air at rest  BMI 23.11 kg/m     Physical Exam:  General- No distress,  A&Ox3, pleasant and appropriate ENT: No sinus tenderness, TM clear, pale nasal mucosa, no oral exudate,no post nasal drip, no LAN Cardiac: S1, S2, regular rate and rhythm, no murmur Chest: No wheeze/ rales/ dullness; no accessory muscle use, no nasal flaring, no sternal retractions Abd.: Soft Non-tender Ext: No clubbing cyanosis, edema Neuro:  normal strength Skin: No rashes, warm and dry Psych: normal mood and behavior  Physical Exam    Assessment/Plan Idiopathic pulmonary fibrosis 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  Previous medications, both Esbriet  and Ofev  not tolerated.  Started on Jascayd 18 mg twice daily November 2025, tolerating well. Worsening dyspnea on exertion Evaluation for home oxygen use Echo with some muscle stiffness but overall normal results Improved chest pain with cough Plan Please monitor your weight while on Jascayd. Continue Jascayd 18 mg twice daily. We have walked you today, and you do qualify for oxygen with exercise.  We will order a POC for you to use.  Use this at 2 L [place] with exercise I have also ordered an overnight oximetry. You will get a call to get this scheduled.  Follow up with Dr. Geronimo as is scheduled 06/30/2024. Please contact office for sooner follow up if symptoms do not improve or worsen or seek emergency care    I spent 30 minutes dedicated to the care of this patient on the date of this encounter to include pre-visit review of records, face-to-face time with the patient discussing conditions above, post visit ordering of testing, clinical documentation with the electronic health record, making appropriate referrals as documented, and communicating necessary information to the patient's healthcare team.   Assessment & Plan        Lauraine JULIANNA Lites, NP 05/30/2024  10:28 AM

## 2024-07-02 ENCOUNTER — Ambulatory Visit: Admitting: Orthopedic Surgery

## 2024-07-05 ENCOUNTER — Ambulatory Visit (INDEPENDENT_AMBULATORY_CARE_PROVIDER_SITE_OTHER): Admitting: Internal Medicine

## 2024-07-05 ENCOUNTER — Encounter: Payer: Self-pay | Admitting: Internal Medicine

## 2024-07-05 VITALS — BP 114/64 | HR 69 | Temp 98.1°F | Ht 73.0 in | Wt 174.6 lb

## 2024-07-05 DIAGNOSIS — R0902 Hypoxemia: Secondary | ICD-10-CM | POA: Diagnosis not present

## 2024-07-05 DIAGNOSIS — Z9109 Other allergy status, other than to drugs and biological substances: Secondary | ICD-10-CM

## 2024-07-05 DIAGNOSIS — Z87891 Personal history of nicotine dependence: Secondary | ICD-10-CM | POA: Diagnosis not present

## 2024-07-05 DIAGNOSIS — R053 Chronic cough: Secondary | ICD-10-CM | POA: Diagnosis not present

## 2024-07-05 DIAGNOSIS — J84112 Idiopathic pulmonary fibrosis: Secondary | ICD-10-CM | POA: Diagnosis not present

## 2024-07-05 DIAGNOSIS — J439 Emphysema, unspecified: Secondary | ICD-10-CM

## 2024-07-05 DIAGNOSIS — Z5181 Encounter for therapeutic drug level monitoring: Secondary | ICD-10-CM

## 2024-07-05 DIAGNOSIS — G4734 Idiopathic sleep related nonobstructive alveolar hypoventilation: Secondary | ICD-10-CM

## 2024-07-05 DIAGNOSIS — J849 Interstitial pulmonary disease, unspecified: Secondary | ICD-10-CM

## 2024-07-05 NOTE — Patient Instructions (Addendum)
"  °    ICD-10-CM   1. IPF (idiopathic pulmonary fibrosis) (HCC)  J84.112     2. ILD (interstitial lung disease) (HCC)  J84.9     3. Encounter for therapeutic drug monitoring  Z51.81     4. Emphysema lung (HCC)  J43.9     5. Exercise hypoxemia  R09.02     6. Dyspnea on exertion  R06.09     7. Chronic cough  R05.3      IPF (idiopathic pulmonary fibrosis) (HCC) ILD (interstitial lung disease) (HCC) Encounter for therapeutic drug monitoring  IPF is getting worse through oct 2025. Since then on JASCAYD . Tolerting it well and clinically stable  Plan - cotnue JASCAYD  anti fibrotic - Respect currently no interest in research as care option - do PFT in 4 months  Emphysema lung (HCC)  Plan  - Continue Trelegy but he can go back down to 100 strength  Exercise hypoxemia Nocturnal hypoxemia     -Plan - Continue portable oxygen 2 L nasal cannula for daily exertion   -BUY BACKPACK Called [facility] - START OVERNIGHT O2 at 1L [place]  - I did order for DME company  Hiatal Hernia  - risk facto for IPF  Plan  - continue your prior excellent measures in conrolling GERD  Chronic cough House dust mite allergy  -due to IPF and dust mite allergy  Plan - Use dust mite proof pillow and bedshhet   Follow-up - 4 weeks 15 min visit - Dr Geronimo "

## 2024-07-05 NOTE — Progress Notes (Signed)
 "    09/29/2022 79 year old male, former smoker.  Past medical history significant for COPD, asthma, GERD, hiatal hernia status post Nissen's fundoplication.  Patient is followed by Dr. Shellia Russell Brown.  He was originally referred due to CT finding showing pulmonary fibrosis, unable to tolerate Ofev  discontinued November 2023.   Patient presents today for follow-up.  He feels his breathing has been worse over the last several months and his wife has noticed decline in his physical condition over the last year. He attended pulmonary rehab for 5-6 weeks but had to stop early d/t family crisis He has a treadmill at home and he likes to walk outside.     03/27/2024 Discussed the use of AI scribe software for clinical note transcription with the patient, who gave verbal consent to proceed.  History of Present Illness Pt. Presents for an acute OV. He was last seen in this office 09/2022 by Russell Ferrari NP. No Chest imaging since 08/06/2022.  Pt. Did have a recent Calcium  Scoring scan 10/29/2023, which had an over read of Severe COPD and fibrotic lung disease. No active disease.Small hiatal hernia.  He presents today for worsening shortness of breath with minimal exertion. He has not been treating his ILD due to the fact both Esbriet  and Ofev  were intolerable to him.  He lost approximately 30 pounds.  He has been using his Trelegy 100 daily.  He does state that this helps with his shortness of breath. Initially, exertion such as hiking or climbing stairs would cause shortness of breath, but now even minimal exertion, like walking from one room to another, results in significant breathlessness. In July, he had a severe attack while working outside, leading to collapse and requiring the use of an albuterol  inhaler for relief. Another similar episode occurred recently while loading his truck, where the inhaler provided relief  Over the last several months he has noticed worsening dry Brown, worsening dyspnea.   We discussed very openly that this is most likely related to progression of his ILD.  We discussed that we need to get new baselines, I have ordered HRCT, pulmonary function testing, and 6-minute walk.  I have also increased his Trelegy to 200 from 100, and have added albuterol  nebs.  He states when he gets out of breath that the albuterol  does help him.  We had a long discussion about how he is straining his heart when his oxygen levels dropped with exertion, and how important it is to treat what we can to keep him as healthy as possible.  We did discuss that there is a new drug for fibrosis that we will be coming out soon that has a better side effect profile.  I have encouraged him to consider trying this.  He also asked about stem cell treatment to improve his ILD.  I advised him to talk to either Russell Brown or Russell Brown regarding this as they would most likely know if there have been any studies that show promising outcomes.  Patient will return to the clinic once PFTs, HRCT, and 6-minute walk been completed to restage his disease for progression and to discuss new options for treatment.  We did discuss that the patient may need oxygen with ambulation.  He said if his 6-minute walk shows he needs that he is up and to the idea.  He was very open with me and told me that he is only here today because of his wife, and his family are asking him to seek  care.  He does have considerable crackles in his bases and midway up his back.  He does have a dry nonproductive Brown, and some pain in his sternum when he coughs.  No fever, discolored secretions.   Test Results: 10/29/2023 CT Coronary Morph w/ CTA COR w/Score Heart is normal size. Aorta normal caliber. Small hiatal hernia. No adenopathy. Severe centrilobular emphysema and fibrotic lung disease, unchanged since prior study. No acute confluent opacities or effusions. No acute findings in the upper abdomen. Chest wall soft tissues are unremarkable.  No acute bony abnormality.   IMPRESSION: Severe COPD and fibrotic lung disease.  08/06/2022 CT chest No acute consolidative airspace disease to suggest a pneumonia. Severe centrilobular emphysema with mild diffuse bronchial wall thickening, suggesting COPD. 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 No appreciable interval progression. Two-vessel coronary atherosclerosis.Small to moderate hiatal hernia. Diffuse osteopenia. Aortic Atherosclerosis (ICD10-I70.0) and Emphysema (ICD10-J43.9).    OV 04/23/2024-transfer of care to see Russell Brown in the ILD center.  Subjective:  Patient ID: Russell Brown, male , DOB: 02-Oct-1945 , age 79 y.o. , MRN: [id number] , ADDRESS: [address] [place] [address] PCP Nche, Roselie Rockford, NP Patient Care Team: Nche, Roselie Rockford, NP as PCP - General (Internal Medicine) Russell Redell RAMAN, MD as PCP - Cardiology (Cardiology) Russell Cough, MD as Consulting Physician (Urology) Mannam, Praveen, MD as Consulting Physician (Pulmonary Disease)  This Provider for this visit: Treatment Team:  Attending Provider: Geronimo Amel, MD    04/23/2024 -   Chief Complaint  Patient presents with   Interstitial Lung Disease    Pt states since LOV breathing has been the same 02 level drops when moving around, lowest 02 has dropped (82) SOB occurs with any activity  Dry Brown      HPI Russell Brown 79 y.o.   Russell Brown is a 79 year old male with pulmonary fibrosis who presents with worsening shortness of breath. He is accompanied by his wife, Russell Brown.  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   He has experienced progressive worsening of his breathing over the past year, with significant deterioration  in the last two to three months. He enjoys walking, particularly in wooded areas, but his current respiratory limitations have significantly impacted this activity. He now experiences labored breathing even on flat ground, requiring support. Previously, he only became winded when climbing stairs or engaging in physical activities like hiking, which he has not done in years. He does not use oxygen but reports a significant drop in oxygen levels [place] where it fell to 82. During a six-minute walk test conducted last week, his oxygen level dropped to 87.  He has previously been treated with Esbriet  and Ofev , both of which he discontinued due to severe side effects, including significant weight loss and severe diarrhea. He has been on Trelegy for about a year, initially at 100 strength, which was later increased to 200 [most recent visit with Lauraine Schultze per external record review]., but he has not noticed any improvement.  He suffers from severe acid reflux and a hiatal hernia, which he believes contributes to his pulmonary issues. He underwent surgery for this condition in 1991, which provided relief for about nine years. He currently manages his symptoms with omeprazole  twice daily and  dietary modifications, although he still experiences hoarseness and frequent throat clearing.  He has a persistent dry Brown, which he attributes to his fibrosis. He has not undergone recent allergy testing, although he sees a dermatologist   His high-resolution CT chest October 2025 shows worsening ILD/IPF. His last echocardiogram was in May 2024.  He has upcoming appoint with Dr. Pietro.   His and his wife's main concerns - Dyspnea relief; currently not using night or portable oxygen. - Brown relief - Address primary health issues of fibrosis and associated emphysema. =     Narrative & Impression personally visualized the CT chest and agree with the findings.  October 2025.   CLINICAL DATA:  Interstitial lung disease. Worsening shortness of breath with exertion. Central chest pain, Brown.   EXAM: CT CHEST WITHOUT CONTRAST   TECHNIQUE: Multidetector CT imaging of the chest was performed following the standard protocol without intravenous contrast. High resolution imaging of the lungs, as well as inspiratory and expiratory imaging, was performed.   RADIATION DOSE REDUCTION: This exam was performed according to the departmental dose-optimization program which includes automated exposure control, adjustment of the mA and/or kV according to patient size and/or use of iterative reconstruction technique.   COMPARISON:  08/06/2022.   FINDINGS: Cardiovascular: Atherosclerotic calcification of the aorta, aortic valve and coronary arteries. Heart is at the upper limits of normal in size. No pericardial effusion.   Mediastinum/Nodes: Right thyroidectomy. No pathologically enlarged mediastinal or axillary lymph nodes. Hilar regions are difficult to definitively evaluate without IV contrast. Esophagus is grossly unremarkable.   Lungs/Pleura: Centrilobular and paraseptal emphysema. Basilar predominant subpleural reticular densities and traction bronchiolectasis, minimally progressive from 08/06/2022. No pleural fluid. Airway is unremarkable. No air trapping.   Upper Abdomen: Gallstone. Moderate hiatal hernia. Surgical clips in the left upper quadrant. Visualized portions of the liver, gallbladder, adrenal glands, kidneys, spleen, pancreas, stomach and bowel are otherwise grossly unremarkable. No upper abdominal adenopathy.   Musculoskeletal: Osteopenia. Degenerative changes in the spine. Slight compression of the T12 vertebral body, unchanged.   IMPRESSION: 1. Pulmonary parenchymal pattern of interstitial lung disease, as detailed above, minimally progressive from 08/06/2022. Findings are categorized as probable UIP per consensus guidelines: Diagnosis  of Idiopathic Pulmonary Fibrosis: An Official ATS/ERS/JRS/ALAT Clinical Practice Guideline. Am JINNY Honey Crit Care Med Vol 198, Iss 5, [phone], Feb 19 2017. 2. Cholelithiasis. 3. Moderate hiatal hernia. 4. Aortic atherosclerosis (ICD10-I70.0). Coronary artery calcification. 5.  Emphysema (ICD10-J43.9).     Electronically Signed   By: Newell Eke M.D.   On: 04/06/2024 13:35     Latest Reference Range & Units 07/04/20 11:33 06/05/21 09:39  Anti Nuclear Antibody (ANA) NEGATIVE  Negative POSITIVE !  ANA Pattern 1   Nuclear, Homogeneous !  ANA Titer 1 titer  1:40 (H)  Cyclic Citrullin Peptide Ab UNITS  <16  RA Latex Turbid. <14 IU/mL <14 <14  !: Data is abnormal (H): Data is abnormally high    ECHO MAY 2024  chocardiogram showed normal pulmonary artery pressure. No needs for heart cath right now. Evidence of grade 2 diastolic heart dysfunction (heart failure) which could be contributing to shortness of breath. You have severe emphysema and fibrotic lung disease on CT imaging which is stable.   05/30/2024 Discussed the use of AI scribe software for clinical note transcription with the patient, who gave verbal consent to proceed.  History of Present Illness Pt. Presents for follow up after starting Jascayd  for his ILD.  He was walked, and dropped to  87% requiring 1 L nasal cannula to maintain oxygen saturations of greater than 88% with ambulation.  Patient is in agreement with us  ordering home oxygen for use with exertion.  Russell Brown had ordered an overnight oximetry at the previous office visit however patient states that they have not received a call to get this scheduled. We will reorder the overnight oximetry and have the patient care coordinator reach out to the patient to get this scheduled.  I did explain to the patient if he is desaturating while sleeping he will need to wear oxygen with sleep.  He verbalized understanding.  Patient continues on Trelegy 200 for his  COPD.  He states this is very helpful.  He is compliant with his medication. Patient states he is tolerating Jascayd  18 mg twice daily without any problem.  He states he does not have quite the same appetite as he had prior to starting the medication however his able to tolerated easily compared to his previous treatments with antifibrotic's.  His weight has been stable.  I discussed with both the patient and his wife how important was to weigh daily and to make sure he did not have weight loss while on this medication.  He did get a 2D echo done in addition to a check up by cardiology.  Echo showed EF of 60 to 65% with normal LV function, consistent with grade 1 diastolic dysfunction.  Explained to the patient he had some mild heart muscle stiffness but otherwise a normal echo.    Patient will follow-up with Russell Brown in 1 month for continued therapeutic monitoring of the Jascayd .  We have placed the order for oxygen and POC for use at 1 to 2 L as needed to maintain oxygen saturations greater than 88% with exertion. We have also ordered an overnight oximetry.  Test Results: HRCT 04/06/2024 Pulmonary parenchymal pattern of interstitial lung disease, as detailed above, minimally progressive from 08/06/2022. Findings are categorized as probable UIP. Lungs/Pleura: Centrilobular and paraseptal emphysema. Basilar predominant subpleural reticular densities and traction bronchiolectasis, minimally progressive from 08/06/2022. No pleural fluid. Airway is unremarkable. No air trapping.    OV 07/05/2024  Subjective:  Patient ID: Russell Brown, male , DOB: [birth year] , age 51 y.o. , MRN: [id number] , ADDRESS: [address] [place] [address] PCP Nche, Roselie Rockford, NP Patient Care Team: Nche, Roselie Rockford, NP as PCP - General (Internal Medicine) Russell Redell RAMAN, MD as PCP - Cardiology (Cardiology) Russell Cough, MD as Consulting Physician (Urology) Mannam, Praveen, MD as  Consulting Physician (Pulmonary Disease)  This Provider for this visit: Treatment Team:  Attending Provider: Geronimo Amel, MD    07/05/2024 -   Chief Complaint  Patient presents with   Interstitial Lung Disease    Pt states since LOV breathing has okay SOB occurs w/ exertion or walking on treadmill Pt stated he recently received 02 06/29/2024 and currently using while on treadmill on 2l     HPI Russell Brown 78 y.o. 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 He is accompanied by his wife.  Currently on Nerandomilast .  His current medication is effective without any side effects, unlike previous medications which caused significant issues. He has been on the current medication for three months and has not experienced any problems, specifically noting the absence of diarrhea, a side effect that affected others he knows.  His breathing  has remained stable and has not worsened. He recently received both portable and home oxygen systems. He has started using the portable oxygen while walking on the treadmill, which he began this past Monday. He sets the oxygen at two liters and finds it helps him walk more, although his oxygen saturation drops to 91% after walking for 25 minutes, compared to his usual 95-96%. He has not yet used the home oxygen system, but he found the instructions recently.  He underwent an overnight pulse oximetry test on December 16th, which showed his oxygen levels dropped below 88% for 15.5 minutes during a total monitoring time of seven hours and 45 minutes. He mentions that he has a home oxygen machine but has not used it yet, as he has been using the portable unit instead.  He recalls a past incident where he lost fingers in an accident with a table saw about 20 years ago. He also mentions a previous surgery, a Nissen fundoplication, which came undone  years ago.  He has been informed of his dust mite and cockroach allergies, although he notes they do not have cockroaches. He acknowledges the presence of dust mites.  Overnight pulse oximetry (06/05/2024): Oxygen saturation less than 88% for 15.5 minutes; total duration monitored 7 hours 45 minutes; oxygen saturation less than 89% for 2 hours   We discussed clinical research as a care option but at this point in time he is not interested because he finally feels that her cell drugs is not causing any side effects.  In the spring of the summer he plans to walk and work in the woods.  I recommended a backpack they can hold his oxygen.  His wife states that they do not know how to use the oxygen concentration.  But he said they will look up the manual for that.  SYMPTOM SCALE - ILD 04/23/2024 07/05/2024 Nerandomilast  x 3 months  Current weight    O2 use ra   Shortness of Breath 0 -> 5 scale with 5 being worst (score 6 If unable to do)   At rest 2 0  Simple tasks - showers, clothes change, eating, shaving 3 2  Household (dishes, doing bed, laundry) 3 2  Shopping 2 2  Walking level at own pace 2 1  Walking up Stairs 5 5  Total (30-36) Dyspnea Score 17 12  How bad is your Brown? 2 moring  How bad is your fatigue 2 2  How bad is appetiee 1 1  How bad is nausea 1 0  How bad is vomiting?  1 0  How bad is diarrhea? 1 0  How bad is anxiety? 2 00  How bad is depression 1   Any chronic pain - if so where and how bad 1 0       SIT STAND TEST - goal 15 times   07/05/2024    O2 used ra   PRobe - finter or forehead finger   Number sit and stand completed - goal 15 15   Time taken to complete 49.8   Resting Pulse Ox/HR/Dyspnea  95% and 68/min and dyspnea of 3/10    Peak measures 93 % and 90/min and dyspnea of 5/10   Final Pulse Ox/HR 91% and 79/min and dyspnea of 4/10   Desaturated </= 88% no   Desaturated <= 3% points yes   Got Tachycardic >/= 90/min yes   Miscellaneous comments  Moderate dyspnea       PFT  Latest Ref Rng & Units 04/11/2024    1:25 PM 09/29/2022    9:54 AM 08/12/2021   11:49 AM 05/28/2020   11:47 AM  PFT Results  FVC-Pre L 3.36  3.51  3.53  3.60   FVC-Predicted Pre % 78  74  74  75   FVC-Post L  3.70  3.68  3.98   FVC-Predicted Post %  78  77  83   Pre FEV1/FVC % % 52  49  53  53   Post FEV1/FCV % %  52  54  51   FEV1-Pre L 1.74  1.71  1.85  1.90   FEV1-Predicted Pre % 56  50  53  54   FEV1-Post L  1.91  2.00  2.04   DLCO uncorrected ml/min/mmHg 10.03  11.77  13.26  13.73   DLCO UNC% % 39  43  48  49   DLCO corrected ml/min/mmHg 10.03  11.32  13.26  13.73   DLCO COR %Predicted % 39  41  48  49   DLVA Predicted % 47  54  60  59   TLC L 6.34  6.73  5.72  6.97   TLC % Predicted % 87  87  74  91   RV % Predicted % 77  124  61  97        LAB RESULTS last 96 hours No results found.       has a past medical history of Arthritis, Asthma, CAP (community acquired pneumonia) (03/03/2012), COPD (chronic obstructive pulmonary disease) (HCC), GERD (gastroesophageal reflux disease), History of hiatal hernia, Hyperlipemia, Hypertension, Hypothyroidism, Pneumonia (2013; 2017), Preventative health care (12/06/2016), PTSD (post-traumatic stress disorder), Pulmonary fibrosis (HCC), Sleep apnea, Throat burning (08/03/2023), and Thyroid  disease.   reports that he quit smoking about 27 years ago. His smoking use included cigarettes. He started smoking about 62 years ago. He has a 35 pack-year smoking history. He has been exposed to tobacco smoke. He has never used smokeless tobacco.  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; table saw injury   FOOT FRACTURE SURGERY Left    heel OR; put 2 steel rods in and  took them out 6 wks later   FRACTURE SURGERY     HERNIA REPAIR Right 1965   LEFT HEART CATH AND CORONARY ANGIOGRAPHY N/A 02/21/2018   Procedure: LEFT HEART CATH AND CORONARY ANGIOGRAPHY;  Surgeon: Burnard Debby LABOR, MD;  Location: [facility];  Service: Cardiovascular;  Laterality: N/A;   NASAL SEPTUM SURGERY  1986   NISSEN FUNDOPLICATION  1990s   w/hernia repair   THYROIDECTOMY, PARTIAL  2000s   WISDOM TOOTH EXTRACTION      Allergies[1]  Immunization History  Administered Date(s) Administered   Fluad Quad(high Dose 65+) 03/16/2019   Fluad Trivalent(High Dose 65+) 02/28/2023, 03/13/2024   H1N1 05/28/2008   INFLUENZA, HIGH DOSE SEASONAL PF 03/05/2014, 03/17/2015, 03/17/2018, 03/26/2019, 03/05/2022   Influenza Whole 04/28/2007, 03/26/2008, 04/16/2009, 05/07/2010, 04/10/2011, 04/13/2012   Influenza, Seasonal, Injecte, Preservative Fre 03/06/2013   Influenza,inj,Quad PF,6+ Mos 03/04/2016   Influenza-Unspecified 03/22/2011, 03/11/2020, 03/27/2021, 02/28/2023   Moderna Covid-19 Fall Seasonal Vaccine 60yrs & older 03/19/2022   Moderna Covid-19 Vaccine  Bivalent Booster 79yrs & up 04/06/2021   Moderna SARS-COV2 Booster Vaccination 05/05/2020   Moderna Sars-Covid-2 Vaccination 08/06/2019, 09/04/2019, 10/23/2020, 03/19/2022   Pneumococcal Conjugate-13 12/05/2014   Pneumococcal Polysaccharide-23 04/11/2008, 04/18/2013, 03/16/2019   Respiratory Syncytial Virus Vaccine,Recomb Aduvanted(Arexvy) 04/02/2022   Td 05/22/2009, 02/16/2011   Tdap 05/05/2022   Zoster Recombinant(Shingrix) 03/21/2017, 09/10/2017   Zoster, Live 05/22/2009    Family History  Problem Relation Age of Onset   Parkinsonism Mother    Osteoporosis Mother    Pneumonia Mother        58   Cancer Father        prostate,  skin   Hyperlipidemia Father    Hypertension Father    Prostate cancer Father    Asthma Son    Osteoporosis Paternal Aunt    Alzheimer's disease Paternal Uncle    Osteoporosis Maternal  Grandmother    Cancer Maternal Grandmother        lung   Heart disease Paternal Grandmother        MI   Heart disease Paternal Grandfather        MI   Colon cancer Neg Hx    Pancreatic cancer Neg Hx    Stomach cancer Neg Hx    Esophageal cancer Neg Hx    Liver cancer Neg Hx    Rectal cancer Neg Hx     Current Medications[2]      Objective:   Vitals:   07/05/24 1311  BP: 114/64  Pulse: 69  Temp: 98.1 F (36.7 C)  TempSrc: Oral  SpO2: 95%  Weight: 174 lb 9.6 oz (79.2 kg)  Height: 6' 1 (1.854 m)    Estimated body mass index is 23.04 kg/m as calculated from the following:   Height as of this encounter: 6' 1 (1.854 m).   Weight as of this encounter: 174 lb 9.6 oz (79.2 kg).  @WEIGHTCHANGE @  [employer]   07/05/24 1311  Weight: 174 lb 9.6 oz (79.2 kg)     Physical Exam   General: No distress. Looks well O2 at rest: no Cane present: no Sitting in wheel chair: no Frail: no Obese: no Neuro: Alert and Oriented x 3. GCS 15. Speech normal Psych: Pleasant Resp:  Barrel Chest - no.  Wheeze - no, Crackles - yes bae mild, No overt respiratory distress CVS: Normal heart sounds. Murmurs - no Ext: Stigmata of Connective Tissue Disease - no HEENT: Normal upper airway. PEERL +. No post nasal drip        Assessment/     Assessment & Plan IPF (idiopathic pulmonary fibrosis) (HCC)  Encounter for therapeutic drug monitoring  Exercise hypoxemia  Nocturnal hypoxemia  Chronic Brown  House dust mite allergy    PLAN Patient Instructions      ICD-10-CM   1. IPF (idiopathic pulmonary fibrosis) (HCC)  J84.112     2. ILD (interstitial lung disease) (HCC)  J84.9     3. Encounter for therapeutic drug monitoring  Z51.81     4. Emphysema lung (HCC)  J43.9     5. Exercise hypoxemia  R09.02     6. Dyspnea on exertion  R06.09     7. Chronic Brown  R05.3      IPF (idiopathic pulmonary fibrosis) (HCC) ILD (interstitial lung disease) (HCC) Encounter for  therapeutic drug monitoring  IPF is getting worse through oct 2025. Since then on JASCAYD . Tolerting it well and clinically stable  Plan - cotnue JASCAYD  anti fibrotic - Respect currently no interest in  research as care option - do PFT in 4 months  Emphysema lung (HCC)  Plan  - Continue Trelegy but he can go back down to 100 strength  Exercise hypoxemia Nocturnal hypoxemia     -Plan - Continue portable oxygen 2 L nasal cannula for daily exertion   -BUY BACKPACK Called [employer] - START OVERNIGHT O2 at 1L [place]  - I did order for DME company  Hiatal Hernia  - risk facto for IPF  Plan  - continue your prior excellent measures in conrolling GERD  Chronic Brown House dust mite allergy  -due to IPF and dust mite allergy  Plan - Use dust mite proof pillow and bedshhet   Follow-up - 4 weeks 15 min visit - Dr Brown BOSCH    Return for - 4 weeks 15 min visit - Dr Brown.    SIGNATURE    Dr. Dorethia Brown, M.D., F.C.C.P,  Pulmonary and Critical Care Medicine Staff Physician, [facility] Center Director - Interstitial Lung Disease  Program  Pulmonary Fibrosis [facility] at [facility] [place], [place], [address]  Pager: [phone], If no answer or between  15:00h - 7:00h: call [phone] Telephone: [phone]  1:44 PM 07/05/2024     [1]  Allergies Allergen Reactions   Dextromorphan Hydrochloride [Dextromethorphan]     Dizzy, nervous, mental clouding.    Codeine Anxiety    Reaction if taken for extended periods of time.  [2]  Current Outpatient Medications:    albuterol  (PROVENTIL ) (2.5 MG/3ML) 0.083% nebulizer solution, Take 3 mLs (2.5 mg total) by nebulization every 6 (six) hours as needed for wheezing or shortness of breath., Disp: 75 mL, Rfl: 12   albuterol  (VENTOLIN  HFA) 108 (90 Base) MCG/ACT inhaler, Inhale 2 puffs into the lungs every 6 (six) hours as needed for wheezing or shortness of breath.,  Disp: 8 g, Rfl: 2   aspirin  EC 81 MG tablet, Take 81 mg by mouth daily., Disp: , Rfl:    atorvastatin  (LIPITOR ) 20 MG tablet, Take 1 tablet (20 mg total) by mouth daily., Disp: 90 tablet, Rfl: 3   calcium  citrate (CALCITRATE - DOSED IN MG ELEMENTAL CALCIUM ) 950 (200 Ca) MG tablet, Take 600 mg of elemental calcium  by mouth daily., Disp: , Rfl:    cholecalciferol (VITAMIN D3) 25 MCG (1000 UNIT) tablet, Take 800 Units by mouth daily., Disp: , Rfl:    dutasteride  (AVODART ) 0.5 MG capsule, Take 1 capsule (0.5 mg total) by mouth daily., Disp: 90 capsule, Rfl: 3   fish oil-omega-3 fatty acids  1000 MG capsule, Take 2 g by mouth daily. , Disp: , Rfl:    Fluticasone -Umeclidin-Vilant (TRELEGY ELLIPTA ) 200-62.5-25 MCG/ACT AEPB, Inhale 1 each into the lungs daily., Disp: 1 each, Rfl: 12   levothyroxine  (SYNTHROID ) 75 MCG tablet, TAKE 1 TABLET BY MOUTH DAILY  BEFORE BREAKFAST, Disp: 90 tablet, Rfl: 3   lisinopril -hydrochlorothiazide  (ZESTORETIC ) 10-12.5 MG tablet, TAKE 1 TABLET BY MOUTH DAILY, Disp: 90 tablet, Rfl: 3   Multiple Vitamin (MULTIVITAMIN WITH MINERALS) TABS tablet, Take 1 tablet by mouth daily., Disp: , Rfl:    nerandomilast  (JASCAYD ) 18 MG tablet, Take 18 mg by mouth every 12 (twelve) hours., Disp: 60 tablet, Rfl: 5   nitroGLYCERIN  (NITROSTAT ) 0.4 MG SL tablet, PLACE 1 TABLET UNDER THE TONGUE EVERY 5 MINUTES AS NEEDED (Patient taking differently: As needed), Disp: 25 tablet, Rfl: 0   omeprazole  (PRILOSEC) 40 MG capsule, TAKE 1 CAPSULE BY  MOUTH TWICE  DAILY, Disp: 180 capsule, Rfl: 3   sertraline  (ZOLOFT ) 100 MG tablet, Take 1.5 tablets (150 mg total) by mouth daily., Disp: 135 tablet, Rfl: 3  "

## 2024-08-03 ENCOUNTER — Ambulatory Visit: Admitting: Internal Medicine

## 2024-11-13 ENCOUNTER — Ambulatory Visit: Admitting: Nurse Practitioner

## 2024-11-20 ENCOUNTER — Ambulatory Visit: Admitting: Internal Medicine

## 2024-11-20 ENCOUNTER — Encounter
